# Patient Record
Sex: Male | Born: 1945
Health system: Southern US, Community
[De-identification: ages and names within clinical notes are randomized; demographics above are authoritative.]

## PROBLEM LIST (undated history)

## (undated) DIAGNOSIS — K219 Gastro-esophageal reflux disease without esophagitis: Secondary | ICD-10-CM

## (undated) DIAGNOSIS — I4891 Unspecified atrial fibrillation: Secondary | ICD-10-CM

## (undated) DIAGNOSIS — I2510 Atherosclerotic heart disease of native coronary artery without angina pectoris: Secondary | ICD-10-CM

## (undated) DIAGNOSIS — N529 Male erectile dysfunction, unspecified: Secondary | ICD-10-CM

## (undated) DIAGNOSIS — I10 Essential (primary) hypertension: Secondary | ICD-10-CM

## (undated) DIAGNOSIS — J449 Chronic obstructive pulmonary disease, unspecified: Secondary | ICD-10-CM

## (undated) DIAGNOSIS — C801 Malignant (primary) neoplasm, unspecified: Secondary | ICD-10-CM

## (undated) HISTORY — DX: Male erectile dysfunction, unspecified: N52.9

## (undated) HISTORY — DX: Essential (primary) hypertension: I10

## (undated) HISTORY — PX: CORONARY ARTERY BYPASS GRAFT: SHX141

## (undated) HISTORY — DX: Unspecified atrial fibrillation: I48.91

## (undated) HISTORY — DX: Gastro-esophageal reflux disease without esophagitis: K21.9

## (undated) HISTORY — DX: Atherosclerotic heart disease of native coronary artery without angina pectoris: I25.10

## (undated) HISTORY — DX: Chronic obstructive pulmonary disease, unspecified: J44.9

---

## 2009-02-03 ENCOUNTER — Encounter: Payer: Self-pay | Admitting: Family Medicine

## 2010-01-06 ENCOUNTER — Ambulatory Visit: Payer: Self-pay | Admitting: Family Medicine

## 2010-01-06 DIAGNOSIS — Z9189 Other specified personal risk factors, not elsewhere classified: Secondary | ICD-10-CM | POA: Insufficient documentation

## 2010-01-06 DIAGNOSIS — K219 Gastro-esophageal reflux disease without esophagitis: Secondary | ICD-10-CM

## 2010-01-06 DIAGNOSIS — N529 Male erectile dysfunction, unspecified: Secondary | ICD-10-CM | POA: Insufficient documentation

## 2010-01-06 DIAGNOSIS — I10 Essential (primary) hypertension: Secondary | ICD-10-CM | POA: Insufficient documentation

## 2010-01-07 ENCOUNTER — Ambulatory Visit: Payer: Self-pay | Admitting: Family Medicine

## 2010-01-08 LAB — CONVERTED CEMR LAB
ALT: 20 units/L (ref 0–53)
AST: 26 units/L (ref 0–37)
BUN: 16 mg/dL (ref 6–23)
Bilirubin, Direct: 0.2 mg/dL (ref 0.0–0.3)
CO2: 30 meq/L (ref 19–32)
Chloride: 101 meq/L (ref 96–112)
Cholesterol: 190 mg/dL (ref 0–200)
Creatinine, Ser: 0.9 mg/dL (ref 0.4–1.5)
Potassium: 4.1 meq/L (ref 3.5–5.1)
Total CHOL/HDL Ratio: 4
Total Protein: 7.1 g/dL (ref 6.0–8.3)
Triglycerides: 120 mg/dL (ref 0.0–149.0)

## 2010-01-15 ENCOUNTER — Encounter: Payer: Self-pay | Admitting: Family Medicine

## 2010-01-15 ENCOUNTER — Telehealth: Payer: Self-pay | Admitting: Family Medicine

## 2010-01-19 DIAGNOSIS — F17200 Nicotine dependence, unspecified, uncomplicated: Secondary | ICD-10-CM

## 2010-01-19 DIAGNOSIS — J449 Chronic obstructive pulmonary disease, unspecified: Secondary | ICD-10-CM | POA: Insufficient documentation

## 2010-04-01 ENCOUNTER — Ambulatory Visit: Payer: Self-pay | Admitting: Family Medicine

## 2010-04-01 ENCOUNTER — Encounter: Payer: Self-pay | Admitting: Cardiology

## 2010-04-01 DIAGNOSIS — M79609 Pain in unspecified limb: Secondary | ICD-10-CM

## 2010-04-02 ENCOUNTER — Encounter: Payer: Self-pay | Admitting: Family Medicine

## 2010-04-10 ENCOUNTER — Ambulatory Visit: Payer: Self-pay | Admitting: Cardiology

## 2010-04-10 DIAGNOSIS — R079 Chest pain, unspecified: Secondary | ICD-10-CM

## 2010-04-13 ENCOUNTER — Telehealth: Payer: Self-pay | Admitting: Cardiology

## 2010-04-17 ENCOUNTER — Telehealth (INDEPENDENT_AMBULATORY_CARE_PROVIDER_SITE_OTHER): Payer: Self-pay | Admitting: *Deleted

## 2010-04-17 ENCOUNTER — Ambulatory Visit: Payer: Self-pay | Admitting: Cardiology

## 2010-04-17 LAB — CONVERTED CEMR LAB
BUN: 21 mg/dL (ref 6–23)
Basophils Relative: 0.7 % (ref 0.0–3.0)
Calcium: 9.4 mg/dL (ref 8.4–10.5)
Eosinophils Absolute: 0.1 10*3/uL (ref 0.0–0.7)
GFR calc non Af Amer: 94.45 mL/min (ref >60)
Glucose, Bld: 103 mg/dL — ABNORMAL HIGH (ref 70–99)
Hemoglobin: 14.6 g/dL (ref 13.0–17.0)
MCHC: 34.8 g/dL (ref 30.0–36.0)
MCV: 97.6 fL (ref 78.0–100.0)
Monocytes Absolute: 0.8 10*3/uL (ref 0.1–1.0)
Neutro Abs: 3.7 10*3/uL (ref 1.4–7.7)
Potassium: 4.2 meq/L (ref 3.5–5.1)
WBC: 6.2 10*3/uL (ref 4.5–10.5)

## 2010-04-22 ENCOUNTER — Inpatient Hospital Stay (HOSPITAL_COMMUNITY)
Admission: EM | Admit: 2010-04-22 | Discharge: 2010-04-29 | Payer: Self-pay | Source: Home / Self Care | Admitting: Cardiology

## 2010-04-22 ENCOUNTER — Inpatient Hospital Stay (HOSPITAL_BASED_OUTPATIENT_CLINIC_OR_DEPARTMENT_OTHER): Admission: RE | Admit: 2010-04-22 | Discharge: 2010-04-22 | Payer: Self-pay | Admitting: Cardiology

## 2010-04-22 ENCOUNTER — Ambulatory Visit: Payer: Self-pay | Admitting: Thoracic Surgery (Cardiothoracic Vascular Surgery)

## 2010-04-22 ENCOUNTER — Encounter: Payer: Self-pay | Admitting: Thoracic Surgery (Cardiothoracic Vascular Surgery)

## 2010-04-22 ENCOUNTER — Ambulatory Visit: Payer: Self-pay | Admitting: Cardiology

## 2010-04-23 ENCOUNTER — Encounter: Payer: Self-pay | Admitting: Thoracic Surgery (Cardiothoracic Vascular Surgery)

## 2010-04-24 ENCOUNTER — Encounter: Payer: Self-pay | Admitting: Thoracic Surgery (Cardiothoracic Vascular Surgery)

## 2010-05-04 ENCOUNTER — Encounter: Payer: Self-pay | Admitting: Cardiology

## 2010-05-08 ENCOUNTER — Telehealth: Payer: Self-pay | Admitting: Cardiology

## 2010-05-14 ENCOUNTER — Ambulatory Visit: Payer: Self-pay | Admitting: Cardiology

## 2010-05-14 DIAGNOSIS — I25810 Atherosclerosis of coronary artery bypass graft(s) without angina pectoris: Secondary | ICD-10-CM | POA: Insufficient documentation

## 2010-05-14 DIAGNOSIS — I4891 Unspecified atrial fibrillation: Secondary | ICD-10-CM

## 2010-05-15 ENCOUNTER — Ambulatory Visit: Payer: Self-pay | Admitting: Cardiology

## 2010-05-18 ENCOUNTER — Ambulatory Visit: Payer: Self-pay | Admitting: Thoracic Surgery (Cardiothoracic Vascular Surgery)

## 2010-05-18 ENCOUNTER — Encounter
Admission: RE | Admit: 2010-05-18 | Discharge: 2010-05-18 | Payer: Self-pay | Admitting: Thoracic Surgery (Cardiothoracic Vascular Surgery)

## 2010-05-19 ENCOUNTER — Encounter (HOSPITAL_COMMUNITY)
Admission: RE | Admit: 2010-05-19 | Discharge: 2010-07-28 | Payer: Self-pay | Source: Home / Self Care | Attending: Cardiology | Admitting: Cardiology

## 2010-05-19 LAB — CONVERTED CEMR LAB: TSH: 6.34 microintl units/mL — ABNORMAL HIGH (ref 0.35–5.50)

## 2010-05-25 ENCOUNTER — Encounter: Payer: Self-pay | Admitting: Cardiology

## 2010-05-29 ENCOUNTER — Encounter: Payer: Self-pay | Admitting: Cardiology

## 2010-06-23 ENCOUNTER — Ambulatory Visit: Payer: Self-pay

## 2010-06-25 ENCOUNTER — Encounter: Payer: Self-pay | Admitting: Cardiology

## 2010-06-25 ENCOUNTER — Ambulatory Visit: Payer: Self-pay | Admitting: Cardiology

## 2010-06-30 ENCOUNTER — Ambulatory Visit
Admission: RE | Admit: 2010-06-30 | Discharge: 2010-06-30 | Payer: Self-pay | Source: Home / Self Care | Attending: Cardiology | Admitting: Cardiology

## 2010-07-07 ENCOUNTER — Encounter: Payer: Self-pay | Admitting: Cardiology

## 2010-07-08 LAB — CONVERTED CEMR LAB
ALT: 23 U/L
AST: 27 U/L
Albumin: 3.6 g/dL
Alkaline Phosphatase: 73 U/L
Bilirubin, Direct: 0.1 mg/dL
Cholesterol: 135 mg/dL
Free T4: 0.96 ng/dL
HDL: 41.6 mg/dL
LDL Cholesterol: 74 mg/dL
T3, Free: 2.3 pg/mL
TSH: 5.45 u[IU]/mL
Total Bilirubin: 0.8 mg/dL
Total CHOL/HDL Ratio: 3
Total Protein: 6.8 g/dL
Triglycerides: 98 mg/dL
VLDL: 19.6 mg/dL

## 2010-07-26 LAB — CONVERTED CEMR LAB
ALT: 25 units/L (ref 0–53)
Alkaline Phosphatase: 98 units/L (ref 39–117)
BUN: 19 mg/dL (ref 6–23)
Bilirubin, Direct: 0.1 mg/dL (ref 0.0–0.3)
Creatinine, Ser: 1.2 mg/dL (ref 0.4–1.5)
GFR calc non Af Amer: 66.2 mL/min (ref >60)
Total Protein: 6.2 g/dL (ref 6.0–8.3)

## 2010-07-28 NOTE — Letter (Signed)
Summary: Records from [facility] 2009 - 2010  Records from [facility] 2009 - 2010   Imported By: Maryln Gottron 01/23/2010 12:39:26  _____________________________________________________________________  External Attachment:    Type:   Image     Comment:   External Document

## 2010-07-28 NOTE — Progress Notes (Signed)
Summary: Pt want prescription changed  Phone Note From Pharmacy   Caller: Patient Caller: [facility] [phone]* Summary of Call: Pharmacy called because the pt requested a 90 day supply that  would be 180 pills pt would like his prescription changed to 180 pills( Zyban ) Initial call taken by: Judie Grieve,  April 13, 2010 3:13 PM    Prescriptions: ZYBAN 150 MG XR12H-TAB (BUPROPION HCL (SMOKING DETER)) Take 1 tablet daily for 3 day.  Then take 1 tablet twice a day.  #180 x 0   Entered by:   Lisabeth Devoid RN   Authorized by:   Marca Ancona, MD   Signed by:   Lisabeth Devoid RN on 04/13/2010   Method used:   Electronically to        [facility] [phone]* (retail)       [address]       [place], [place]  [address]       Ph: [phone]       Fax: [phone]   RxID:   [id number]

## 2010-07-28 NOTE — Assessment & Plan Note (Signed)
Summary: LEFT ARM PAIN/CLE   Vital Signs:  Patient profile:   65 year old male Height:      70 inches Weight:      183 pounds BMI:     26.35 Temp:     97.7 degrees F oral Pulse rate:   60 / minute Pulse rhythm:   regular BP sitting:   122 / 80  (right arm) Cuff size:   regular  Vitals Entered By: Linde Gillis CMA [name] Dull) (April 01, 2010 3:11 PM) CC: left arm pain   History of Present Illness: Has been walking about at at time, several times a week.  Occ episodes of L arm pain during exercise.  Most of the time the patient can walk w/o symptoms.  H/o L olecranon bursitis, but this is different per patient.  When walking and pain happens, rest will help- resolves in 1-2 minutes.  No chest pain.  Occ SOB on hills when walking.  Smoking about 3 packs a week, cutting down some.  HTN, controlled.  Smoked for about 50 years.    L handed.  No pain with lifting objects.    Allergies (verified): No Known Drug Allergies  Past History:  Past Medical History: Last updated: 01/19/2010 COPD (ICD-496) ERECTILE DYSFUNCTION, ORGANIC (ICD-607.84) HYPERTENSION (ICD-401.9) GERD (ICD-530.81) CHICKENPOX, HX OF (ICD-V15.9)    Family History: Reviewed history from 01/19/2010 and no changes required. Family History of Arthritis, parents Family History Hypertension, parents Father: Died of lung cancer Mother: Died of unknown causes in a rest home Siblings: None  Social History: Reviewed history from 01/19/2010 and no changes required. 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    Review of Systems       See HPI.  Otherwise negative.    Physical Exam  General:  GEN: nad, alert and oriented HEENT: mucous membranes moist NECK: supple w/o LA, no bruit CV: rrr.  no murmur PULM: ctab, no inc  wob ABD: soft, +bs EXT: no edema, 2+ DP and radial pulses SKIN: no acute rash, benign appearing SKs noted on torso L shoulder with minimal impingement on exam, o/w wnl for motor function.  L arm and shoulder not tender to palpation.    Impression & Recommendations:  Problem # 1:  ARM PAIN, LEFT (ICD-729.5) >25 min spent with patient, at least half of which was spent on counseling re: symptoms and plan. I don't think this is due to a cuff finding.  I would have patient follow up with cards given the exertional component, even though it is atypical (ie no CP).  He is a smoker with h/o htn.  He agrees with plan.  I would appreciate cards input on utility for stress testing.  If CP, to ER.  he agrees.  Orders: EKG w/ Interpretation (93000) Cardiology Referral (Cardiology)  Complete Medication List: 1)  Lisinopril 10 Mg Tabs (Lisinopril) .... Take 1/2 tablet by mouth once a day 2)  Hydrochlorothiazide 25 Mg Tabs (Hydrochlorothiazide) .... Take 1/2 tablet by mouth once a day 3)  Ranitidine Hcl 150 Mg Caps (Ranitidine hcl) .... Take 1 tablet by mouth once a day 4)  Multivitamins Tabs (Multiple vitamin) .... Take 1 tablet by mouth once a day 5)  Folic Acid Powd (Folic acid) .... Take 1 tablet by mouth once a day 6)  Aspirin 81  Mg Tabs (Aspirin) .... Take 1 tablet by mouth once a day 7)  Magnesium 300 Mg Caps (Magnesium) .... Take 1 tablet by mouth once a day 8)  Viagra 50 Mg Tabs (Sildenafil citrate) .[name].. 1 by mouth daily as needed.  Patient Instructions: 1)  Keep taking your regular medicine and see Shirlee Limerick about your referral before your leave today.  If you have chest pain, go to the ER.   Current Allergies (reviewed today): No known allergies

## 2010-07-28 NOTE — Medication Information (Signed)
Summary: Prior Authorization & Approval for Viagra/[facility]  Prior Authorization & Approval for Viagra/[facility]   Imported By: Lanelle Bal 01/21/2010 11:20:22  _____________________________________________________________________  External Attachment:    Type:   Image     Comment:   External Document

## 2010-07-28 NOTE — Cardiovascular Report (Signed)
Summary: Pre Cath Orders   Pre Cath Orders   Imported By: Roderic Ovens 04/20/2010 16:04:27  _____________________________________________________________________  External Attachment:    Type:   Image     Comment:   External Document

## 2010-07-28 NOTE — Miscellaneous (Signed)
Summary: [facility] Progress Note   [facility] Progress Note   Imported By: Roderic Ovens 06/02/2010 12:36:24  _____________________________________________________________________  External Attachment:    Type:   Image     Comment:   External Document

## 2010-07-28 NOTE — Progress Notes (Signed)
Summary: Question about medications  Phone Note Call from Patient Call back at Home Phone [phone]   Caller: Patient Summary of Call: [facility] about medication Initial call taken by: Judie Grieve,  April 13, 2010 8:36 AM  Follow-up for Phone Call        [name] calls today b/c pharmacy unable to fill zyban.  His insurance would cover a 90 day supply.  I sent that in this am.  He also had questions about b/p meds.  With Metoprolol his systolic is running 107-110.  He is having no adverse symptoms and will stagger his lisinopril and hctz. Mylo Red RN    Prescriptions: ZYBAN 150 MG XR12H-TAB (BUPROPION HCL (SMOKING DETER)) Take 1 tablet daily for 3 day.  Then take 1 tablet twice a day.  #90 x 0   Entered by:   Lisabeth Devoid RN   Authorized by:   Marca Ancona, MD   Signed by:   Lisabeth Devoid RN on 04/13/2010   Method used:   Electronically to        [facility] [phone]* (retail)       [address]       [place], [place]  [address]       Ph: [phone]       Fax: [phone]   RxID:   [id number]   Appended Document: Question about medications That BP is ok for him.  Want systolic > 100 as long as asymptomatic

## 2010-07-28 NOTE — Assessment & Plan Note (Signed)
Summary: TRANSFER FROM [facility]/RX REFILL/CLE   Vital Signs:  Patient profile:   65 year old male Height:      70 inches Weight:      179.50 pounds BMI:     25.85 Temp:     97.6 degrees F oral Pulse rate:   80 / minute Pulse rhythm:   regular BP sitting:   140 / 60  (left arm) Cuff size:   regular  Vitals Entered By: Delilah Shan CMA Xyla Leisner Dull) (January 06, 2010 8:32 AM) CC: Transfer from [place] - Rx. refills   History of Present Illness: Hypertension: taking 1/2 dose of each med.  Using medication without problems or lightheadedness: yes Chest pain with exertion:no Edema:no Short of breath:no Average home BPs:120s/70-80s Other issues:   ED- prev on viagra per Dr. Abigail Miyamoto.  Was taking 50mg  tabs.  Had some stomach upset if taken around mealtime.  Has good effect from medicine.  Needs new rx.  [facility] [place] [place]  Preventive Screening-Counseling & Management  Alcohol-Tobacco     Smoking Status: current  Caffeine-Diet-Exercise     Does Patient Exercise: yes      Drug Use:  no.    Problems Prior to Update: 1)  Erectile Dysfunction, Organic  (ICD-607.84) 2)  Hypertension  (ICD-401.9) 3)  Gerd  (ICD-530.81) 4)  Chickenpox, Hx of  (ICD-V15.9)  Current Medications (verified): 1)  Lisinopril 10 Mg Tabs (Lisinopril) .... Take 1/2 Tablet By Mouth Once A Day 2)  Hydrochlorothiazide 25 Mg Tabs (Hydrochlorothiazide) .... Take 1/2 Tablet By Mouth Once A Day 3)  Ranitidine Hcl 150 Mg Caps (Ranitidine Hcl) .... Take 1 Tablet By Mouth Once A Day 4)  Multivitamins   Tabs (Multiple Vitamin) .... Take 1 Tablet By Mouth Once A Day 5)  Folic Acid   Powd (Folic Acid) .... Take 1 Tablet By Mouth Once A Day 6)  Aspirin 81 Mg  Tabs (Aspirin) .... Take 1 Tablet By Mouth Once A Day 7)  Magnesium 300 Mg Caps (Magnesium) .... Take 1 Tablet By Mouth Once A Day 8)  Viagra 50 Mg Tabs (Sildenafil Citrate) .[name].. 1 By Mouth Daily As Needed.  Allergies (verified): No Known Drug Allergies  Past  History:  Past Medical History: HYPERTENSION (ICD-401.9) GERD (ICD-530.81) CHICKENPOX, HX OF (ICD-V15.9)    Family History: Family History of Arthritis, parents Family History Hypertension, parents  Social History: Current Smoker 3 packs week Alcohol use-no Drug use-no Regular exercise-yes Caffeinated beverages - yes Dentures - Yes Smoking Status:  current Drug Use:  no Does Patient Exercise:  yes  Physical Exam  General:  GEN: nad, alert and oriented HEENT: mucous membranes moist, dentures noted NECK: supple w/o LA CV: rrr.  no murmur PULM: ctab, no inc wob ABD: soft, +bs EXT: no edema SKIN: no acute rash, benign appearing SKs noted on torso   Impression & Recommendations:  Problem # 1:  HYPERTENSION (ICD-401.9) Hard copy written for patient to have fasting CMET/lipid drawn at a [facility] site in [place].  this will be closer for patient and he is not fasting today.  He understood plan.  Continue current meds.  Will contact with labs. Plan for physical later this year.  Requesting records.  His updated medication list for this problem includes:    Lisinopril 10 Mg Tabs (Lisinopril) .[name]... Take 1/2 tablet by mouth once a day    Hydrochlorothiazide 25 Mg Tabs (Hydrochlorothiazide) .[name]... Take 1/2 tablet by mouth once a day  Problem # 2:  ERECTILE DYSFUNCTION,  ORGANIC (ICD-607.84) Rx sent.  His updated medication list for this problem includes:    Viagra 50 Mg Tabs (Sildenafil citrate) .[name]... 1 by mouth daily as needed.  Complete Medication List: 1)  Lisinopril 10 Mg Tabs (Lisinopril) .... Take 1/2 tablet by mouth once a day 2)  Hydrochlorothiazide 25 Mg Tabs (Hydrochlorothiazide) .... Take 1/2 tablet by mouth once a day 3)  Ranitidine Hcl 150 Mg Caps (Ranitidine hcl) .... Take 1 tablet by mouth once a day 4)  Multivitamins Tabs (Multiple vitamin) .... Take 1 tablet by mouth once a day 5)  Folic Acid Powd (Folic acid) .... Take 1 tablet by mouth once a day 6)  Aspirin 81  Mg Tabs (Aspirin) .... Take 1 tablet by mouth once a day 7)  Magnesium 300 Mg Caps (Magnesium) .... Take 1 tablet by mouth once a day 8)  Viagra 50 Mg Tabs (Sildenafil citrate) .[name].. 1 by mouth daily as needed.  Patient Instructions: 1)  Please schedule a follow-up appointment in 6 months  for a physical.  Come in fasting.  I'll get your records from [facility] in the meantime.  You can have your labs drawn in the next few weeks at any [facility] site.  I'll contact you with the results.  Prescriptions: VIAGRA 50 MG TABS (SILDENAFIL CITRATE) 1 by mouth daily as needed.  #60 x 3   Entered and Authorized by:   Crawford Givens MD   Signed by:   Crawford Givens MD on 01/06/2010   Method used:   Electronically to        [facility] [phone]* (retail)       [address]       [place], [place]  [address]       Ph: [phone]       Fax: [phone]   RxID:   [id number]   Current Allergies (reviewed today): No known allergies

## 2010-07-28 NOTE — Letter (Signed)
Summary: Out of Work  [facility], Main Office  [address]   [place], [place] [address]   Phone: [phone]  Fax: [phone]    April 10, 2010   Employee:  [employer]    To Whom It May Concern:   Please excuse the above named employee from work due to: medical reasons until he is cleared to return for work.  Start:   October 14,2011   If you need additional information, please feel free to contact our office.         Sincerely,   Dr. Pervis Hocking RN

## 2010-07-28 NOTE — Progress Notes (Signed)
Summary: prior Berkley Harvey is needed for viagra  Phone Note From Pharmacy   Caller: [id number] Summary of Call: Prior Berkley Harvey is needed for viagra, form is on your desk. Initial call taken by: Lowella Petties CMA,  January 15, 2010 8:19 AM  Follow-up for Phone Call        signed, thanks.   Follow-up by: Crawford Givens MD,  January 15, 2010 10:59 AM  Additional Follow-up for Phone Call Additional follow up Details #1::        Faxed and form given to [facility]. Lugene Fuquay CMA Sharea Guinther Dull)  January 15, 2010 4:08 PM      Appended Document: prior Berkley Harvey is needed for viagra Prior auth approved for viagra.  Form sent for dr's signature and scanning.

## 2010-07-28 NOTE — Progress Notes (Signed)
Summary: pt request call  Phone Note Call from Patient Call back at [phone]   Caller: Patient Reason for Call: Talk to Nurse Initial call taken by: Judie Grieve,  May 08, 2010 9:05 AM  Follow-up for Phone Call        I talked with pt--he complains of a tickle in his throat that causes him to have a dry cough-this started about the day after he was discharged from the hospital--he denies SOB,edema or weight gain, --overall he is doing fine-I confirmed pt is not taking Lisinopril-I will review with Dr Gae Gallop, RN, BSN  May 08, 2010 9:30 AM      Appended Document: pt request call reviewed  with Dr Darvin Neighbours recommended no changes at present --I talked with pt--pt will monitor symptoms and will see Dr Shirlee Latch 05/14/10

## 2010-07-28 NOTE — Miscellaneous (Signed)
Summary: [facility] Physician Order/Treatment Plan   [facility] Physician Order/Treatment Plan   Imported By: Roderic Ovens 05/18/2010 15:10:01  _____________________________________________________________________  External Attachment:    Type:   Image     Comment:   External Document

## 2010-07-28 NOTE — Assessment & Plan Note (Signed)
Summary: NP6/CHEST PAIN & ARM APIN/JML   Visit Type:  new pt visit Referring Provider:  Raechel Ache Primary Provider:  Raechel Ache  CC:  sob w/exertion...left arm pain going on since on and off....chest pressure/numbness....denies any edema....  History of Present Illness: 65 yo with history of HTN, COPD and smoking presents for evaluation of left arm pain and chest heaviness with exertion.  For the last 4-5 months, patient has had episodes of left arm throbbing associated with shortness of breath and chest pressure/heaviness.  These episodes tend to occur with fairly heavy exertion, but have been becoming more frequent recently.  He initially had these episodes while in [place] on vacation.  He would develop the symptoms walking up very steep hills, and the chest pressure/arm throbbing would resolve with rest.  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  He walks with his wife several times a week at [employer] [place], and has begun to note the symptoms when he goes up hills briskly there.  The pain is now occurring also daily at work when he loads heavy bags.  He never has the symptoms at rest or with only mild exertion.  Pain always resolves promptly with rest.  He continues to smoke about 3 packs/week.    ECG: NSR, normal   Labs (7/11): LDL 122, HDL 44, K 4.1, creatinine 0.9  Current Medications (verified): 1)  Lisinopril 10 Mg Tabs (Lisinopril) .... Take 1/2 Tablet By Mouth Once A Day 2)  Hydrochlorothiazide 25 Mg Tabs (Hydrochlorothiazide) .... Take 1/2 Tablet By Mouth Once A Day 3)  Ranitidine Hcl 150 Mg Caps (Ranitidine Hcl) .[name].. 1 Tab Two Times A Day 4)  Multivitamins   Tabs (Multiple Vitamin) .... Take 1 Tablet By Mouth Once A Day 5)  Folic Acid 1 Mg Tabs (Folic Acid) .[name].. 1 Tab 2 X Weekly 6)  Aspirin 81 Mg  Tabs (Aspirin) .... Take 1 Tablet By Mouth Once A Day 7)  Magnesium 300 Mg Caps (Magnesium) .... Take  1 Tablet By Mouth Once A Day 8)  Viagra 50 Mg Tabs (Sildenafil Citrate) .[name].. 1 By Mouth Daily As Needed.  Allergies (verified): No Known Drug Allergies  Past History:  Past Medical History: 1. COPD (ICD-496): active smoker 2. ERECTILE DYSFUNCTION, ORGANIC (ICD-607.84) 3. HYPERTENSION (ICD-401.9) 4. GERD (ICD-530.81)  Family History: Family History of Arthritis, parents Family History Hypertension, parents Father: Died of lung cancer Mother: Died of unknown causes in a rest home - sounds like possible sudden cardiac death at age 40. 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    Review of Systems       All systems reviewed and negative except as per HPI.   Vital Signs:  Patient profile:   65 year old male Height:      70 inches Weight:      179.4 pounds BMI:     25.83 Pulse rate:   59 / minute Pulse rhythm:   regular BP sitting:   132 / 74  (left arm) Cuff size:   large  Vitals Entered By: Danielle Rankin, CMA (April 10, 2010 10:24 AM)  Physical Exam  General:  Well developed, well nourished, in  no acute distress. Head:  normocephalic and atraumatic Nose:  no deformity, discharge, inflammation, or lesions Mouth:  Teeth, gums and palate normal. Oral mucosa normal. Neck:  Neck supple, no JVD. No masses, thyromegaly or abnormal cervical nodes. Lungs:  Clear bilaterally to auscultation and percussion. Heart:  Non-displaced PMI, chest non-tender; regular rate and rhythm, S1, S2 without murmurs, rubs.  +S4. Carotid upstroke normal, no bruit.  Pedals normal pulses. No edema, no varicosities. Abdomen:  Bowel sounds positive; abdomen soft and non-tender without masses, organomegaly, or hernias noted. No  hepatosplenomegaly. Msk:  Back normal, normal gait. Muscle strength and tone normal. Extremities:  No clubbing or cyanosis. Neurologic:  Alert and oriented x 3. Skin:  Intact without lesions or rashes. Psych:  Normal affect.   Impression & Recommendations:  Problem # 1:  CHEST PAIN (ICD-786.50) Patient has episodes of exertional chest heaviness and left arm throbbing.  Symptoms resolve with rest.  Moderate to heavy exertion brings on the symptoms.  They have been occurring more lately, but never at rest or with minimal exertion.  Story is worrisome for angina.   - Plan left heart cath, will use radial approach. - Out of work until cath given frequent symptoms at work.  - Will give NTG sublingual. - Start metoprolol 25 mg two times a day - Continue ASA - To ER if rest pain or pain does not resolve quickly with rest or NTG.   Problem # 2:  TOBACCO ABUSE (ICD-305.1) Strongly encouraged him to quit.   Other Orders: EKG w/ Interpretation (93000) Cardiac Catheterization (Cardiac Cath)  Patient Instructions: 1)  Your physician recommends that you return for lab work ON:GEXBMWU 21,2011  bmet,cbc, pt,ptt 401.9 2)  Your physician has recommended you make the following change in your medication: DO NOT TAKE VIAGRA SAME TIME AS NTG 3)  Your physician has requested that you have a cardiac catheterization.  Cardiac catheterization is used to diagnose and/or treat various heart conditions. Doctors may recommend this procedure for a number of different reasons. The most common reason is to evaluate chest pain. Chest pain can be a symptom of coronary artery disease (CAD), and cardiac catheterization can show whether plaque is narrowing or blocking your heart's arteries. This procedure is also used to evaluate the valves, as well as measure the blood flow and oxygen levels in different parts of your heart.  For further information please visit [email or web address]  Please follow instruction sheet, as  given. Prescriptions: NITROSTAT 0.4 MG SUBL (NITROGLYCERIN) 1 tablet under tongue at onset of chest pain; you may repeat every 5 minutes for up to 3 doses.  #25 x 11   Entered by:   Lisabeth Devoid RN   Authorized by:   Marca Ancona, MD   Signed by:   Lisabeth Devoid RN on 04/10/2010   Method used:   Electronically to        [facility] [phone]* (retail)       [address]       [place], [place]  [address]       Ph: [phone]       Fax: [phone]   RxID:   [id number] ZYBAN 150 MG XR12H-TAB (BUPROPION HCL (SMOKING DETER)) Take 1 tablet daily for 3 day.  Then take 1 tablet twice a day.  #57 x 3   Entered by:   Lisabeth Devoid RN   Authorized by:   Marca Ancona, MD   Signed by:  Lisabeth Devoid RN on 04/10/2010   Method used:   Electronically to        [facility]S  [facility] [phone]* (retail)       [address]       [place], [place]  [address]       Ph: [phone]       Fax: [phone]   RxID:   [id number] METOPROLOL TARTRATE 25 MG TABS (METOPROLOL TARTRATE) Take one tablet by mouth twice a day  #60 x 6   Entered by:   Lisabeth Devoid RN   Authorized by:   Marca Ancona, MD   Signed by:   Lisabeth Devoid RN on 04/10/2010   Method used:   Electronically to        CVS  [facility] [phone]* (retail)       [address]       [place], [place]  [address]       Ph: [phone]       Fax: [phone]   RxID:   [id number]

## 2010-07-28 NOTE — Letter (Signed)
Summary: Cardiac Catheterization Instructions- [facility]  [facility], Main Office  [address]   [place], [place] [address]   Phone: [phone]  Fax: [phone]     04/10/2010 MRN: [id number]  [name] [name] [address] [place], [place]  [address]  Dear [name], [name] are scheduled for a Cardiac Catheterization on October 26,2011 with Dr. Shirlee Latch.  Please arrive to the 1st floor of the Heart and Vascular Center at [facility] at 8:30 am  on the day of your procedure. Please do not arrive before 6:30 a.m. Call the Heart and Vascular Center at [phone] if you are unable to make your appointmnet. The Code to get into the parking garage under the building is 0200. Take the elevators to the 1st floor. You must have someone to drive you home. Someone must be with you for the first 24 hours after you arrive home. Please wear clothes that are easy to get on and off and wear slip-on shoes. Do not eat or drink after midnight except water with your medications that morning. Bring all your medications and current insurance cards with you.   __x_ Make sure you take your aspirin.  __x_ You may take ALL of your medications with water that morning.   The usual length of stay after your procedure is 2 to 3 hours. This can vary.  If you have any questions, please call the office at the number listed above.  Dr. Pervis Hocking RN

## 2010-07-28 NOTE — Progress Notes (Signed)
  Pt left [employer] papers to be completed sent to [facility]  April 17, 2010 10:00 AM

## 2010-07-28 NOTE — Assessment & Plan Note (Signed)
Summary: [name] [name]   Visit Type:  Follow-up Referring Provider:  Raechel Ache Primary Provider:  Raechel Ache   History of Present Illness: 65 yo with history of HTN, COPD and smoking initially presented with exertional chest pain.  Left heart cath was done, showing severe 3 vessel disease.  Patient therefore underwent CABG in 10/11.  Postoperative course was complicated by transient atrial fibrillation.  He is in sinus rhythm today.  He has been doing well in general since getting home.  He has been walking for 20 minutes twice a day without exertional dyspnea or chest pain.  He does get some fatigue.  He plans to start cardiac rehab on Tuesday.  He has quit smoking.   ECG: NSR, normal   Labs (7/11): LDL 122, HDL 44, K 4.1, creatinine 0.9  Current Medications (verified): 1)  Ranitidine Hcl 150 Mg Caps (Ranitidine Hcl) .[name].. 1 Tab Two Times A Day 2)  Multivitamins   Tabs (Multiple Vitamin) .... Take 1 Tablet By Mouth Once A Day 3)  Folic Acid 1 Mg Tabs (Folic Acid) .[name].. 1 Tab 2 X Weekly 4)  Aspirin 81 Mg  Tabs (Aspirin) .... Take 1 Tablet By Mouth Once A Day 5)  Viagra 50 Mg Tabs (Sildenafil Citrate) .[name].. 1 By Mouth Daily As Needed. 6)  Metoprolol Tartrate 25 Mg Tabs (Metoprolol Tartrate) .... Take One Tablet By Mouth Twice A Day 7)  Zyban 150 Mg Xr12h-Tab (Bupropion Hcl (Smoking Deter)) .... Take 1 Tablet Twice A Day. 8)  Nitrostat 0.4 Mg Subl (Nitroglycerin) .[name].. 1 Tablet Under Tongue At Onset of Chest Pain; You May Repeat Every 5 Minutes For Up To 3 Doses. 9)  Amiodarone Hcl 200 Mg Tabs (Amiodarone Hcl) .... Take One Tablet By Mouth Twice A Day 10)  Fibertab 625 Mg Tabs (Calcium Polycarbophil) .... Uad 11)  Crestor 20 Mg Tabs (Rosuvastatin Calcium) .... Take One Tablet By Mouth Daily. 12)  Diphenhydramine Hcl 25 Mg Tabs (Diphenhydramine Hcl) .... Uad  Allergies (verified): No Known Drug Allergies  Past History:  Past Medical History: 1. COPD (ICD-496): quit smoking 10/11.  2.  ERECTILE DYSFUNCTION, ORGANIC (ICD-607.84) 3. HYPERTENSION (ICD-401.9) 4. GERD (ICD-530.81) 5. CAD: Exertional chest pain prompted LHC (10/11) showing EF 55%, mild inferior hypokinesis, 90% prox RCA, 70% mid RCA, 80% distal RCA, 70% ostial PDA, 70% mPLV, 90% mid OM1 (large), 90-95% prox LAD.  Patient had CABG with LIMA-LAD, SVG-OM1, seq SVG-PDA/PLV.  6. Atrial fibrillation: Brief, post-op CABG.   Family History: Reviewed history from 04/10/2010 and no changes required. Family History of Arthritis, parents Family History Hypertension, parents Father: Died of lung cancer Mother: Died of unknown causes in a rest home - sounds like possible sudden cardiac death at age 54. Siblings: None  Social History: Quit smoking 10/11.  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    Review of Systems       All systems reviewed and negative except as per HPI.   Vital Signs:  Patient profile:   65 year old male Height:      70 inches Weight:      174 pounds BMI:     25.06 Pulse rate:   58 / minute BP sitting:   104 / 60  (left arm)  Vitals Entered By: Laurance Flatten CMA (May 14, 2010 9:02 AM)  Physical Exam  General:  Well developed, well nourished, in no acute distress. Neck:  Neck supple, no JVD. No masses, thyromegaly or abnormal cervical nodes. Lungs:  Clear bilaterally to auscultation and percussion. Heart:  Non-displaced PMI, chest non-tender; regular rate and rhythm, S1, S2 without murmurs, rubs.  +S4. Carotid upstroke normal, no bruit.  Pedals normal pulses. No edema, no varicosities. Abdomen:  Bowel sounds positive; abdomen soft and non-tender without masses, organomegaly, or hernias noted. No hepatosplenomegaly. Extremities:  No clubbing or cyanosis. Neurologic:  Alert and  oriented x 3. Skin:  Healing midline sternotomy.  Psych:  Normal affect.   Impression & Recommendations:  Problem # 1:  CORONARY ATHEROSLERO UNSPEC TYPE BYPASS GRAFT (ICD-414.05) Patient is stable post-CABG.  EF preserved on pre-op LV-gram.  No ischemic symptoms since discharge.  Continue ASA, metoprolol, Crestor. Will restart lisinopril after amiodarone is discontinued (BP is soft currently).  To start cardiac rehab.  Will need lipids/LFTs in 2 months.   Problem # 2:  COPD (ICD-496) Patient has quit smoking.   Problem # 3:  ATRIAL FIBRILLATION (ICD-427.31) Patient had post-operative atrial fibrillation.  Would favor continuing amiodarone for 1 month post-discharge then stopping.  Check TSH and LFTs today.    Followup in 6 wks.   Other Orders: TLB-TSH (Thyroid Stimulating Hormone) (84443-TSH) TLB-Hepatic/Liver Function Pnl (80076-HEPATIC) TLB-BMP (Basic Metabolic Panel-BMET) (80048-METABOL)  Patient Instructions: 1)  Your physician recommends that you have lab today---TSH/BMP/Liver profile  414.05  2)  Your physician recommends that you schedule a follow-up appointment in: 6 weeks with Dr Shirlee Latch. Prescriptions: CRESTOR 20 MG TABS (ROSUVASTATIN CALCIUM) Take one tablet by mouth daily.  #90 x 1   Entered by:   Katina Dung, RN, BSN   Authorized by:   Marca Ancona, MD   Signed by:   Katina Dung, RN, BSN on 05/14/2010   Method used:   Electronically to        [facility] [phone]* (retail)       [address]       [place], [place]  [address]       Ph: [phone]       Fax: [phone]   RxID:   [id number] METOPROLOL TARTRATE 25 MG TABS (METOPROLOL TARTRATE) Take one tablet by mouth twice a day  #180 x 3   Entered by:   Katina Dung, RN, BSN   Authorized by:   Marca Ancona, MD   Signed by:   Katina Dung, RN, BSN on 05/14/2010   Method used:   Electronically to        [facility] [phone]* (retail)       [address]        [place], [place]  [address]       Ph: [phone]       Fax: [phone]   RxID:   [id number]

## 2010-07-30 NOTE — Letter (Signed)
Summary: [facility]  [facility]   Imported By: Marylou Mccoy 06/15/2010 14:59:01  _____________________________________________________________________  External Attachment:    Type:   Image     Comment:   External Document

## 2010-07-30 NOTE — Miscellaneous (Signed)
Summary: [facility]iac Progress Note    Cardiac Progress Note   Imported By: Roderic Ovens 07/20/2010 15:49:04  _____________________________________________________________________  External Attachment:    Type:   Image     Comment:   External Document

## 2010-07-30 NOTE — Assessment & Plan Note (Signed)
Summary: per check out   Visit Type:  Follow-up Referring Provider:  Raechel Ache Primary Provider:  Raechel Ache  CC:  no complaints.  History of Present Illness: 65 yo with history of HTN, COPD and smoking initially presented with exertional chest pain.  Left heart cath was done, showing severe 3 vessel disease.  Patient therefore underwent CABG in 10/11.  Postoperative course was complicated by transient atrial fibrillation.  He continues in sinus rhythm today.  He has been doing well in general since getting home.  He has been walking for 20 minutes twice a day without exertional dyspnea or chest pain.  He is off amiodarone now.  He is in cardiac rehab.  His mood seems to have improved.  He continues to remain abstinent from smoking.  Systolic BP has been running in the 100s at home.  No lightheadedness or syncope.    ECG: NSR, LAE  Labs (7/11): LDL 122, HDL 44, K 4.1, creatinine 0.9 Labs (11/11): TSH 6.34 (increased), free T3 low, free T4 normal, LFTs normal, K 4.4, creatinine 1.2  Current Medications (verified): 1)  Ranitidine Hcl 150 Mg Caps (Ranitidine Hcl) .[name].. 1 Tab Two Times A Day 2)  Multivitamins   Tabs (Multiple Vitamin) .... Take 1 Tablet By Mouth Once A Day 3)  Folic Acid 1 Mg Tabs (Folic Acid) .[name].. 1 Tab 2 X Weekly 4)  Aspirin Ec 325 Mg Tbec (Aspirin) .... Take One Tablet By Mouth Daily 5)  Viagra 50 Mg Tabs (Sildenafil Citrate) .[name].. 1 By Mouth Daily As Needed. 6)  Metoprolol Tartrate 25 Mg Tabs (Metoprolol Tartrate) .... Take One Tablet By Mouth Twice A Day 7)  Nitrostat 0.4 Mg Subl (Nitroglycerin) .[name].. 1 Tablet Under Tongue At Onset of Chest Pain; You May Repeat Every 5 Minutes For Up To 3 Doses. 8)  Fibertab 625 Mg Tabs (Calcium Polycarbophil) .... Uad 9)  Crestor 20 Mg Tabs (Rosuvastatin Calcium) .... Take One Tablet By Mouth Daily. 10)  Diphenhydramine Hcl 25 Mg Tabs (Diphenhydramine Hcl) .... Uad  Allergies (verified): No Known Drug Allergies  Past  History:  Past Medical History: Reviewed history from 05/14/2010 and no changes required. 1. COPD (ICD-496): quit smoking 10/11.  2. ERECTILE DYSFUNCTION, ORGANIC (ICD-607.84) 3. HYPERTENSION (ICD-401.9) 4. GERD (ICD-530.81) 5. CAD: Exertional chest pain prompted LHC (10/11) showing EF 55%, mild inferior hypokinesis, 90% prox RCA, 70% mid RCA, 80% distal RCA, 70% ostial PDA, 70% mPLV, 90% mid OM1 (large), 90-95% prox LAD.  Patient had CABG with LIMA-LAD, SVG-OM1, seq SVG-PDA/PLV.  6. Atrial fibrillation: Brief, post-op CABG.   Family History: Reviewed history from 04/10/2010 and no changes required. Family History of Arthritis, parents Family History Hypertension, parents Father: Died of lung cancer Mother: Died of unknown causes in a rest home - sounds like possible sudden cardiac death at age 60. Siblings: None  Social History: Reviewed history from 05/14/2010 and no changes required. Quit smoking 10/11.  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    Vital Signs:  Patient profile:   65 year old male Height:      70 inches Weight:      182.50 pounds BMI:     26.28 Pulse rate:   62 / minute BP sitting:   106 / 62  (left arm) Cuff size:   regular  Vitals  Entered By: Caralee Ates CMA (June 25, 2010 8:42 AM)  Physical Exam  General:  Well developed, well nourished, in no acute distress. Neck:  Neck supple, no JVD. No masses, thyromegaly or abnormal cervical nodes. Lungs:  Clear bilaterally to auscultation and percussion. Heart:  Non-displaced PMI, chest non-tender; regular rate and rhythm, S1, S2 without murmurs, rubs.  +S4. Carotid upstroke normal, no bruit.  Pedals normal pulses. No edema, no varicosities. Abdomen:  Bowel sounds positive; abdomen soft and non-tender  without masses, organomegaly, or hernias noted. No hepatosplenomegaly. Extremities:  No clubbing or cyanosis. Neurologic:  Alert and oriented x 3. Psych:  Normal affect.   Impression & Recommendations:  Problem # 1:  CORONARY ATHEROSLERO UNSPEC TYPE BYPASS GRAFT (ICD-414.05) Patient is stable post-CABG.  EF preserved on pre-op LV-gram.  No ischemic symptoms since discharge.  Continue ASA, metoprolol, Crestor. Holding off on ACEI for now with lowish BP.  Needs lipids/LFTs with goal LDL < 70.   Problem # 2:  ATRIAL FIBRILLATION (ICD-427.31) Patient had post-operative atrial fibrillation.  No further atrial fibrillation noted.  Patient is off amiodarone.   Problem # 3:  TOBACCO ABUSE (ICD-305.1) Patient remains abstinent from smoking.   Problem # 4:  THYROID Labs in 11/11 were consistent with mild hypothyroidism.  Patient has been off amiodarone for a number of weeks now.  Will repeat TSH, free T4, and free T3.  If still abnormal, will start levothyroxine.   Other Orders: EKG w/ Interpretation (93000)  Patient Instructions: 1)  Return for FASTING labwork on Tues 06/30/10: lipid/liver/tsh/free T3/ free T4 (414.05).- lab opens at 8:30am. 2)  Your physician recommends that you continue on your current medications as directed. Please refer to the Current Medication list given to you today. 3)  Your physician wants you to follow-up in: 6 months.  You will receive a reminder letter in the mail two months in advance. If you don't receive a letter, please call our office to schedule the follow-up appointment.

## 2010-09-08 LAB — BASIC METABOLIC PANEL
Calcium: 8.3 mg/dL — ABNORMAL LOW (ref 8.4–10.5)
Chloride: 107 mEq/L (ref 96–112)
Creatinine, Ser: 0.88 mg/dL (ref 0.4–1.5)
GFR calc Af Amer: >60 mL/min (ref >60)
GFR calc non Af Amer: >60 mL/min (ref >60)

## 2010-09-08 LAB — BRAIN NATRIURETIC PEPTIDE: Pro B Natriuretic peptide (BNP): 464 pg/mL — ABNORMAL HIGH (ref 0.0–100.0)

## 2010-09-08 LAB — GLUCOSE, CAPILLARY: Glucose-Capillary: 109 mg/dL — ABNORMAL HIGH (ref 70–99)

## 2010-09-09 LAB — APTT: aPTT: 30 seconds (ref 24–37)

## 2010-09-09 LAB — BASIC METABOLIC PANEL
BUN: 12 mg/dL (ref 6–23)
BUN: 13 mg/dL (ref 6–23)
BUN: 17 mg/dL (ref 6–23)
CO2: 29 mEq/L (ref 19–32)
CO2: 30 mEq/L (ref 19–32)
Calcium: 8.2 mg/dL — ABNORMAL LOW (ref 8.4–10.5)
Calcium: 8.4 mg/dL (ref 8.4–10.5)
Chloride: 104 mEq/L (ref 96–112)
Chloride: 104 mEq/L (ref 96–112)
Chloride: 106 mEq/L (ref 96–112)
Chloride: 111 mEq/L (ref 96–112)
Creatinine, Ser: 0.82 mg/dL (ref 0.4–1.5)
Creatinine, Ser: 0.85 mg/dL (ref 0.4–1.5)
GFR calc Af Amer: >60 mL/min (ref >60)
GFR calc Af Amer: >60 mL/min (ref >60)
GFR calc Af Amer: >60 mL/min (ref >60)
GFR calc non Af Amer: >60 mL/min (ref >60)
GFR calc non Af Amer: >60 mL/min (ref >60)
Glucose, Bld: 116 mg/dL — ABNORMAL HIGH (ref 70–99)
Glucose, Bld: 91 mg/dL (ref 70–99)
Potassium: 3.6 mEq/L (ref 3.5–5.1)
Potassium: 3.6 mEq/L (ref 3.5–5.1)
Potassium: 4 mEq/L (ref 3.5–5.1)
Potassium: 4.3 mEq/L (ref 3.5–5.1)
Sodium: 136 mEq/L (ref 135–145)
Sodium: 138 mEq/L (ref 135–145)
Sodium: 139 mEq/L (ref 135–145)
Sodium: 141 mEq/L (ref 135–145)

## 2010-09-09 LAB — POCT I-STAT, CHEM 8
Calcium, Ion: 1.19 mmol/L (ref 1.12–1.32)
Chloride: 101 mEq/L (ref 96–112)
Creatinine, Ser: 0.9 mg/dL (ref 0.4–1.5)
Glucose, Bld: 127 mg/dL — ABNORMAL HIGH (ref 70–99)
Glucose, Bld: 134 mg/dL — ABNORMAL HIGH (ref 70–99)
HCT: 33 % — ABNORMAL LOW (ref 39.0–52.0)
Hemoglobin: 10.9 g/dL — ABNORMAL LOW (ref 13.0–17.0)
Hemoglobin: 11.2 g/dL — ABNORMAL LOW (ref 13.0–17.0)
Potassium: 3.8 mEq/L (ref 3.5–5.1)
Potassium: 4.2 mEq/L (ref 3.5–5.1)

## 2010-09-09 LAB — BLOOD GAS, ARTERIAL
Bicarbonate: 28.7 mEq/L — ABNORMAL HIGH (ref 20.0–24.0)
Drawn by: 337321
FIO2: 0.21 %
O2 Saturation: 96.6 %
Patient temperature: 98.6
pH, Arterial: 7.439 (ref 7.350–7.450)
pO2, Arterial: 79.1 mmHg — ABNORMAL LOW (ref 80.0–100.0)

## 2010-09-09 LAB — POCT I-STAT 3, ART BLOOD GAS (G3+)
Acid-base deficit: 1 mmol/L (ref 0.0–2.0)
Acid-base deficit: 2 mmol/L (ref 0.0–2.0)
Bicarbonate: 24.1 mEq/L — ABNORMAL HIGH (ref 20.0–24.0)
Bicarbonate: 24.5 mEq/L — ABNORMAL HIGH (ref 20.0–24.0)
Bicarbonate: 26.3 mEq/L — ABNORMAL HIGH (ref 20.0–24.0)
O2 Saturation: 96 %
O2 Saturation: 96 %
O2 Saturation: 96 %
Patient temperature: 35.1
Patient temperature: 37.5
TCO2: 25 mmol/L (ref 0–100)
TCO2: 26 mmol/L (ref 0–100)
TCO2: 26 mmol/L (ref 0–100)
TCO2: 27 mmol/L (ref 0–100)
pCO2 arterial: 34 mmHg — ABNORMAL LOW (ref 35.0–45.0)
pCO2 arterial: 39.4 mmHg (ref 35.0–45.0)
pCO2 arterial: 40 mmHg (ref 35.0–45.0)
pCO2 arterial: 51.5 mmHg — ABNORMAL HIGH (ref 35.0–45.0)
pCO2 arterial: 54 mmHg — ABNORMAL HIGH (ref 35.0–45.0)
pH, Arterial: 7.266 — ABNORMAL LOW (ref 7.350–7.450)
pH, Arterial: 7.281 — ABNORMAL LOW (ref 7.350–7.450)
pH, Arterial: 7.433 (ref 7.350–7.450)
pH, Arterial: 7.468 — ABNORMAL HIGH (ref 7.350–7.450)
pO2, Arterial: 364 mmHg — ABNORMAL HIGH (ref 80.0–100.0)
pO2, Arterial: 97 mmHg (ref 80.0–100.0)

## 2010-09-09 LAB — PROTIME-INR
INR: 1 (ref 0.00–1.49)
INR: 1.37 (ref 0.00–1.49)
Prothrombin Time: 13.4 seconds (ref 11.6–15.2)

## 2010-09-09 LAB — COMPREHENSIVE METABOLIC PANEL
ALT: 22 U/L (ref 0–53)
AST: 24 U/L (ref 0–37)
Albumin: 3.5 g/dL (ref 3.5–5.2)
BUN: 16 mg/dL (ref 6–23)
CO2: 27 mEq/L (ref 19–32)
Calcium: 9 mg/dL (ref 8.4–10.5)
Chloride: 101 mEq/L (ref 96–112)
Creatinine, Ser: 0.9 mg/dL (ref 0.4–1.5)
GFR calc Af Amer: >60 mL/min (ref >60)
GFR calc non Af Amer: >60 mL/min (ref >60)
Glucose, Bld: 135 mg/dL — ABNORMAL HIGH (ref 70–99)
Sodium: 135 mEq/L (ref 135–145)
Total Bilirubin: 0.9 mg/dL (ref 0.3–1.2)
Total Protein: 6 g/dL (ref 6.0–8.3)

## 2010-09-09 LAB — CBC
HCT: 28.5 % — ABNORMAL LOW (ref 39.0–52.0)
HCT: 29.3 % — ABNORMAL LOW (ref 39.0–52.0)
HCT: 29.9 % — ABNORMAL LOW (ref 39.0–52.0)
HCT: 30.5 % — ABNORMAL LOW (ref 39.0–52.0)
HCT: 30.7 % — ABNORMAL LOW (ref 39.0–52.0)
HCT: 33.3 % — ABNORMAL LOW (ref 39.0–52.0)
HCT: 42.4 % (ref 39.0–52.0)
Hemoglobin: 10.2 g/dL — ABNORMAL LOW (ref 13.0–17.0)
Hemoglobin: 10.3 g/dL — ABNORMAL LOW (ref 13.0–17.0)
Hemoglobin: 13.7 g/dL (ref 13.0–17.0)
Hemoglobin: 14.2 g/dL (ref 13.0–17.0)
Hemoglobin: 9.7 g/dL — ABNORMAL LOW (ref 13.0–17.0)
Hemoglobin: 9.8 g/dL — ABNORMAL LOW (ref 13.0–17.0)
MCH: 32.6 pg (ref 26.0–34.0)
MCH: 32.6 pg (ref 26.0–34.0)
MCH: 32.7 pg (ref 26.0–34.0)
MCH: 32.7 pg (ref 26.0–34.0)
MCHC: 33.4 g/dL (ref 30.0–36.0)
MCHC: 33.4 g/dL (ref 30.0–36.0)
MCHC: 33.8 g/dL (ref 30.0–36.0)
MCHC: 33.9 g/dL (ref 30.0–36.0)
MCHC: 34 g/dL (ref 30.0–36.0)
MCV: 95.3 fL (ref 78.0–100.0)
MCV: 96 fL (ref 78.0–100.0)
MCV: 96.1 fL (ref 78.0–100.0)
MCV: 97.3 fL (ref 78.0–100.0)
MCV: 97.4 fL (ref 78.0–100.0)
MCV: 98.1 fL (ref 78.0–100.0)
Platelets: 109 10*3/uL — ABNORMAL LOW (ref 150–400)
Platelets: 113 10*3/uL — ABNORMAL LOW (ref 150–400)
Platelets: 192 10*3/uL (ref 150–400)
Platelets: 198 10*3/uL (ref 150–400)
Platelets: 91 10*3/uL — ABNORMAL LOW (ref 150–400)
Platelets: 94 10*3/uL — ABNORMAL LOW (ref 150–400)
RBC: 2.97 MIL/uL — ABNORMAL LOW (ref 4.22–5.81)
RBC: 3.01 MIL/uL — ABNORMAL LOW (ref 4.22–5.81)
RBC: 3.13 MIL/uL — ABNORMAL LOW (ref 4.22–5.81)
RBC: 3.43 MIL/uL — ABNORMAL LOW (ref 4.22–5.81)
RDW: 12.4 % (ref 11.5–15.5)
RDW: 12.4 % (ref 11.5–15.5)
RDW: 12.6 % (ref 11.5–15.5)
RDW: 12.6 % (ref 11.5–15.5)
RDW: 12.6 % (ref 11.5–15.5)
RDW: 12.8 % (ref 11.5–15.5)
RDW: 12.8 % (ref 11.5–15.5)
WBC: 11.2 10*3/uL — ABNORMAL HIGH (ref 4.0–10.5)
WBC: 11.7 10*3/uL — ABNORMAL HIGH (ref 4.0–10.5)
WBC: 11.8 10*3/uL — ABNORMAL HIGH (ref 4.0–10.5)
WBC: 11.9 10*3/uL — ABNORMAL HIGH (ref 4.0–10.5)
WBC: 6.3 10*3/uL (ref 4.0–10.5)
WBC: 7.5 10*3/uL (ref 4.0–10.5)
WBC: 8.5 10*3/uL (ref 4.0–10.5)

## 2010-09-09 LAB — GLUCOSE, CAPILLARY
Glucose-Capillary: 100 mg/dL — ABNORMAL HIGH (ref 70–99)
Glucose-Capillary: 100 mg/dL — ABNORMAL HIGH (ref 70–99)
Glucose-Capillary: 117 mg/dL — ABNORMAL HIGH (ref 70–99)
Glucose-Capillary: 119 mg/dL — ABNORMAL HIGH (ref 70–99)
Glucose-Capillary: 127 mg/dL — ABNORMAL HIGH (ref 70–99)
Glucose-Capillary: 131 mg/dL — ABNORMAL HIGH (ref 70–99)
Glucose-Capillary: 158 mg/dL — ABNORMAL HIGH (ref 70–99)
Glucose-Capillary: 175 mg/dL — ABNORMAL HIGH (ref 70–99)

## 2010-09-09 LAB — POCT I-STAT 4, (NA,K, GLUC, HGB,HCT)
Glucose, Bld: 113 mg/dL — ABNORMAL HIGH (ref 70–99)
Glucose, Bld: 115 mg/dL — ABNORMAL HIGH (ref 70–99)
Glucose, Bld: 116 mg/dL — ABNORMAL HIGH (ref 70–99)
Glucose, Bld: 87 mg/dL (ref 70–99)
HCT: 28 % — ABNORMAL LOW (ref 39.0–52.0)
HCT: 31 % — ABNORMAL LOW (ref 39.0–52.0)
HCT: 31 % — ABNORMAL LOW (ref 39.0–52.0)
HCT: 39 % (ref 39.0–52.0)
Hemoglobin: 10.5 g/dL — ABNORMAL LOW (ref 13.0–17.0)
Hemoglobin: 12.2 g/dL — ABNORMAL LOW (ref 13.0–17.0)
Hemoglobin: 13.3 g/dL (ref 13.0–17.0)
Hemoglobin: 9.5 g/dL — ABNORMAL LOW (ref 13.0–17.0)
Potassium: 3.8 mEq/L (ref 3.5–5.1)
Potassium: 3.9 mEq/L (ref 3.5–5.1)
Sodium: 134 mEq/L — ABNORMAL LOW (ref 135–145)
Sodium: 140 mEq/L (ref 135–145)

## 2010-09-09 LAB — CROSSMATCH
ABO/RH(D): B POS
Antibody Screen: NEGATIVE
Unit division: 0

## 2010-09-09 LAB — CREATININE, SERUM
Creatinine, Ser: 0.79 mg/dL (ref 0.4–1.5)
Creatinine, Ser: 0.84 mg/dL (ref 0.4–1.5)
GFR calc non Af Amer: >60 mL/min (ref >60)

## 2010-09-09 LAB — URINALYSIS, MICROSCOPIC ONLY
Protein, ur: NEGATIVE mg/dL
Urobilinogen, UA: 0.2 mg/dL (ref 0.0–1.0)

## 2010-09-09 LAB — POCT I-STAT 3, VENOUS BLOOD GAS (G3P V)
O2 Saturation: 75 %
TCO2: 26 mmol/L (ref 0–100)
pCO2, Ven: 41 mmHg — ABNORMAL LOW (ref 45.0–50.0)
pH, Ven: 7.381 — ABNORMAL HIGH (ref 7.250–7.300)
pO2, Ven: 40 mmHg (ref 30.0–45.0)

## 2010-09-09 LAB — MAGNESIUM: Magnesium: 3.1 mg/dL — ABNORMAL HIGH (ref 1.5–2.5)

## 2010-09-09 LAB — TSH: TSH: 2.319 u[IU]/mL (ref 0.350–4.500)

## 2010-09-09 LAB — PLATELET COUNT: Platelets: 122 10*3/uL — ABNORMAL LOW (ref 150–400)

## 2010-09-09 LAB — HEMOGLOBIN AND HEMATOCRIT, BLOOD: HCT: 30.1 % — ABNORMAL LOW (ref 39.0–52.0)

## 2010-09-09 LAB — ABO/RH: ABO/RH(D): B POS

## 2010-10-28 ENCOUNTER — Telehealth: Payer: Self-pay | Admitting: Cardiology

## 2010-10-28 NOTE — Telephone Encounter (Signed)
Pt wants rx prescritpion to be mail to his home. Pt is going to use mail order.

## 2010-10-28 NOTE — Telephone Encounter (Signed)
Refill - crestor [facility]. [facility] [address] [phone].

## 2010-10-30 ENCOUNTER — Other Ambulatory Visit: Payer: Self-pay | Admitting: *Deleted

## 2010-10-30 MED ORDER — ROSUVASTATIN CALCIUM 20 MG PO TABS: 20.0000 mg | ORAL_TABLET | Freq: Every day | ORAL | Status: DC

## 2010-10-30 NOTE — Telephone Encounter (Signed)
Gave pt a call he states he wants his med prescription mailed to his house I filled it as #90 with 3 refills

## 2010-11-10 NOTE — Assessment & Plan Note (Signed)
OFFICE VISIT   [name] [name], [name] [name]  DOB:  [birth year]                                        May 18, 2010  CHART #:  [id number]   HISTORY:  The patient is a 65 year old gentleman, who had coronary  artery bypass grafting x4 on April 24, 2010.  He had presented with  classical anginal symptoms and had three-vessel disease with normal left  ventricular function, had a catheterization.  Postoperatively, he had  some atrial fibrillation that resolved with amiodarone.  He did not  require anticoagulation and he was discharged home without any major  complications.  The patient states that he has been doing well.  He  still has some soreness in his chest, but he has not taken any pain  medication as this may be the second or third day he was at home.  He  has had a persistent chronic cough.  He says it feels more like a tickle  in his throat.  He  does not feel like it is coming from down deep.  He  has also had some congestion in his ears.  He has not had any fevers or  chills.  His cough has been nonproductive.  He states that he is walking  about a half an hour at a time on a daily basis and overall feels well.   CURRENT MEDICATIONS:  1. Amiodarone 200 mg b.i.d.  2. Crestor 20 mg daily.  3. Aspirin 325 mg daily.  4. Wellbutrin XL 150 mg b.i.d.  5. Metoprolol 25 mg b.i.d.  6. Multivitamin 1 daily.  7. Ranitidine 150 mg b.i.d.   ALLERGIES:  He has no known drug allergies.   PHYSICAL EXAMINATION:  General:  The patient is a well-appearing 65-year-  old gentleman, in no acute distress.  Vital Signs:  His blood pressure  is 132/75, pulse is 60, respirations are 18, and oxygen saturation is  98% on room air.  Lungs:  Clear to auscultation and percussion with  equal breath sounds bilaterally.  Cardiac:  Regular rate and rhythm.  Normal S1 and S2.  There is no rub or murmur.  Sternum is stable.  Sternal incision is clean, dry, and intact.  Chest tube sites  are  healing well.  Extremities:  He has no peripheral edema.  Leg incisions  are healing well.   DIAGNOSTIC TESTS:  Chest x-ray shows good aeration of the lungs.  There  are trace effusions bilaterally.   IMPRESSION:  The patient is a 65 year old gentleman status post coronary  artery bypass grafting.  He is now about 3 weeks out from surgery.  I  advised him not to lift any objects weigh greater than 10 pounds for  another 3 weeks.  He may begin driving.  Appropriate precautions were  discussed.  He did have some transient atrial fibrillation in the  hospital and was sent home on amiodarone.  He says he has enough of that  to get him through about Wednesday of this week.  We were going to stop  that at 4 weeks anyway, so rather than have him refill a prescription  for 2 or 3 days.  He is going to go ahead and stop the amiodarone once  his current prescription runs out.  He had questions about traveling to  [place] around Christmas, I  do not see any problem with that, that is a  month away.  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  He will continue to be followed by Dr.  Shirlee Latch and Dr. Para March.  I would be happy to see him back anytime if I  can be of any further assistance with his care.   Salvatore Decent Dorris Fetch, M.D.  Electronically Signed   SCH/MEDQ  D:  05/18/2010  T:  05/18/2010  Job:  [id number]   cc:   Marca Ancona, MD  Dwana Curd. Para March, M.D.

## 2010-11-30 ENCOUNTER — Encounter: Payer: Self-pay | Admitting: Cardiology

## 2010-12-17 ENCOUNTER — Ambulatory Visit: Payer: Self-pay | Admitting: Cardiology

## 2010-12-21 ENCOUNTER — Encounter: Payer: Self-pay | Admitting: Cardiology

## 2010-12-21 ENCOUNTER — Ambulatory Visit (INDEPENDENT_AMBULATORY_CARE_PROVIDER_SITE_OTHER): Payer: Medicare Other | Admitting: Cardiology

## 2010-12-21 ENCOUNTER — Telehealth: Payer: Self-pay | Admitting: Cardiology

## 2010-12-21 DIAGNOSIS — I10 Essential (primary) hypertension: Secondary | ICD-10-CM

## 2010-12-21 DIAGNOSIS — E7800 Pure hypercholesterolemia, unspecified: Secondary | ICD-10-CM

## 2010-12-21 DIAGNOSIS — I25810 Atherosclerosis of coronary artery bypass graft(s) without angina pectoris: Secondary | ICD-10-CM

## 2010-12-21 DIAGNOSIS — E785 Hyperlipidemia, unspecified: Secondary | ICD-10-CM

## 2010-12-21 DIAGNOSIS — Z79899 Other long term (current) drug therapy: Secondary | ICD-10-CM

## 2010-12-21 DIAGNOSIS — I4891 Unspecified atrial fibrillation: Secondary | ICD-10-CM

## 2010-12-21 MED ORDER — LISINOPRIL 2.5 MG PO TABS: 2.5000 mg | ORAL_TABLET | Freq: Every day | ORAL | Status: DC

## 2010-12-21 NOTE — Telephone Encounter (Signed)
[facility] [place]  [facility] LISINOPRIL 2.5 MG AT PT'S REQUEST .Zack Seal

## 2010-12-21 NOTE — Patient Instructions (Addendum)
Your physician recommends that you schedule a follow-up appointment in:  6 MONTHS WITH DR [facility]  Your physician has recommended you make the following change in your medication: LISINOPRIL 2.5  MG   Your physician recommends that you return for lab work in:  2 WEEKS  BMET LIPID AND LIVER  DX 414.05 V58.69  272.0

## 2010-12-21 NOTE — Telephone Encounter (Signed)
New lisinorpril 2.[facility]. [facility] [address] [phone].

## 2010-12-22 DIAGNOSIS — E785 Hyperlipidemia, unspecified: Secondary | ICD-10-CM | POA: Insufficient documentation

## 2010-12-22 NOTE — Assessment & Plan Note (Signed)
Check lipids, goal LDL < 70.  

## 2010-12-22 NOTE — Assessment & Plan Note (Signed)
No atrial fibrillation has been noted except immediately post-operatively.

## 2010-12-22 NOTE — Assessment & Plan Note (Signed)
Stable with no significant ischemic symptoms.  Can decrease ASA to 162 mg daily. Continue Crestor and Toprol XL. He has more BP room now, so I will start lisinopril 2.5 mg daily for secondary prevention.  BMET in 2 wks.

## 2010-12-22 NOTE — Progress Notes (Signed)
PCP: Dr. Para March  65 yo with history of HTN, COPD and smoking initially presented with exertional chest pain.  Left heart cath was done, showing severe 3 vessel disease.  Patient therefore underwent CABG in 10/11.  Postoperative course was complicated by transient atrial fibrillation.  He continues in sinus rhythm today.  He has been doing well in general since getting home.  He has retired since the last time I saw him.  He has stayed off tobacco.  He is walking 4 miles/day at [employer] with his wife.  No significant exertional dyspnea or chest pain with this.  Very mild dyspnea with walking up a hill.    ECG: NSR at 55, normal  Labs (7/11): LDL 122, HDL 44, K 4.1, creatinine 0.9 Labs (11/11): TSH 6.34 (increased), free T3 low, free T4 normal, LFTs normal, K 4.4, creatinine 1.2 Labs (1/12): LDL 74, HDL 42, TSH normal  Allergies (verified):  No Known Drug Allergies  Past Medical History: 1. COPD (ICD-496): quit smoking 10/11.  2. ERECTILE DYSFUNCTION, ORGANIC (ICD-607.84) 3. HYPERTENSION (ICD-401.9) 4. GERD (ICD-530.81) 5. CAD: Exertional chest pain prompted LHC (10/11) showing EF 55%, mild inferior hypokinesis, 90% prox RCA, 70% mid RCA, 80% distal RCA, 70% ostial PDA, 70% mPLV, 90% mid OM1 (large), 90-95% prox LAD.  Patient had CABG with LIMA-LAD, SVG-OM1, seq SVG-PDA/PLV.  6. Atrial fibrillation: Brief, post-op CABG.   Family History: Family History of Arthritis, parents Family History Hypertension, parents Father: Died of lung cancer Mother: Died of unknown causes in a rest home - sounds like possible sudden cardiac death at age 73. Siblings: None  Social History: Quit smoking 10/11.  Alcohol use-no Drug use-no Regular exercise-yes Caffeinated beverages - yes Dentures - Yes Marital Status: Married Children:  3 who are well  Retired from [employer].   Current Outpatient Prescriptions  Medication Sig Dispense Refill  . aspirin 325 MG tablet Take 325 mg by mouth daily.         . Cholecalciferol (VITAMIN D) 1000 UNITS capsule Take 1,000 Units by mouth daily.        . diphenhydrAMINE (SOMINEX) 25 MG tablet Take 25 mg by mouth as directed.        . metoprolol succinate (TOPROL-XL) 25 MG 24 hr tablet Take 25 mg by mouth 2 (two) times daily.        . Multiple Vitamin (MULTIVITAMIN) tablet Take 1 tablet by mouth daily.        . nitroGLYCERIN (NITROSTAT) 0.4 MG SL tablet Place 0.4 mg under the tongue every 5 (five) minutes as needed. May repeat up to 3 doses.       . polycarbophil (FIBERCON) 625 MG tablet Take 625 mg by mouth as directed.        . rosuvastatin (CRESTOR) 20 MG tablet Take 1 tablet (20 mg total) by mouth at bedtime.  90 tablet  2  . sildenafil (VIAGRA) 50 MG tablet Take 50 mg by mouth daily as needed.        . vitamin E 100 UNIT capsule Take 100 Units by mouth daily.        [name] lisinopril (PRINIVIL,ZESTRIL) 2.5 MG tablet Take 1 tablet (2.5 mg total) by mouth daily.  30 tablet  11    BP 128/66  Pulse 55  Ht 5\' 9"  (1.753 m)  Wt 179 lb (81.194 kg)  BMI 26.43 kg/m2 General:  Well developed, well nourished, in no acute distress. Neck:  Neck supple, no JVD. No masses, thyromegaly or abnormal  cervical nodes. Lungs:  Clear bilaterally to auscultation and percussion. Heart:  Non-displaced PMI, chest non-tender; regular rate and rhythm, S1, S2 without murmurs, rubs.  +S4. Carotid upstroke normal, no bruit.  Pedals normal pulses. No edema, no varicosities. Abdomen:  Bowel sounds positive; abdomen soft and non-tender without masses, organomegaly, or hernias noted. No hepatosplenomegaly. Extremities:  No clubbing or cyanosis. Neurologic:  Alert and oriented x 3. Psych:  Normal affect.

## 2011-01-04 ENCOUNTER — Other Ambulatory Visit (INDEPENDENT_AMBULATORY_CARE_PROVIDER_SITE_OTHER): Payer: Medicare Other | Admitting: *Deleted

## 2011-01-04 DIAGNOSIS — E7800 Pure hypercholesterolemia, unspecified: Secondary | ICD-10-CM

## 2011-01-04 DIAGNOSIS — Z79899 Other long term (current) drug therapy: Secondary | ICD-10-CM

## 2011-01-04 LAB — LIPID PANEL
LDL Cholesterol: 44 mg/dL (ref 0–99)
VLDL: 23.2 mg/dL (ref 0.0–40.0)

## 2011-01-04 LAB — BASIC METABOLIC PANEL
BUN: 15 mg/dL (ref 6–23)
Chloride: 103 mEq/L (ref 96–112)
Creatinine, Ser: 0.8 mg/dL (ref 0.4–1.5)
GFR: 108.7 mL/min (ref >60.00)
Glucose, Bld: 104 mg/dL — ABNORMAL HIGH (ref 70–99)
Potassium: 3.8 mEq/L (ref 3.5–5.1)

## 2011-01-04 LAB — HEPATIC FUNCTION PANEL
Bilirubin, Direct: 0.1 mg/dL (ref 0.0–0.3)
Total Bilirubin: 1.1 mg/dL (ref 0.3–1.2)

## 2011-02-04 ENCOUNTER — Other Ambulatory Visit: Payer: Self-pay | Admitting: Cardiology

## 2011-02-04 DIAGNOSIS — I10 Essential (primary) hypertension: Secondary | ICD-10-CM

## 2011-02-04 MED ORDER — LISINOPRIL 2.5 MG PO TABS: 2.5000 mg | ORAL_TABLET | Freq: Every day | ORAL | Status: DC

## 2011-02-04 NOTE — Telephone Encounter (Signed)
Per pt calling pt wants a 90 day supply with 3 refills [facility] [place]

## 2011-02-09 ENCOUNTER — Other Ambulatory Visit: Payer: Self-pay | Admitting: Cardiology

## 2011-02-09 DIAGNOSIS — I10 Essential (primary) hypertension: Secondary | ICD-10-CM

## 2011-02-09 NOTE — Telephone Encounter (Signed)
Pt called back and would like this recalled in for a 90 day supply with 3 refills.  Was called in, but only for 30 days.  Wants to get all medicine on the same schedule.  Call pt when this is done.  [phone]

## 2011-02-11 MED ORDER — LISINOPRIL 2.5 MG PO TABS: 2.5000 mg | ORAL_TABLET | Freq: Every day | ORAL | Status: DC

## 2011-04-14 ENCOUNTER — Telehealth: Payer: Self-pay | Admitting: Cardiology

## 2011-04-14 DIAGNOSIS — I10 Essential (primary) hypertension: Secondary | ICD-10-CM

## 2011-04-14 NOTE — Telephone Encounter (Signed)
Metoprolol 25 mg 90 days supply, uses [facility]

## 2011-04-15 ENCOUNTER — Other Ambulatory Visit: Payer: Self-pay | Admitting: *Deleted

## 2011-04-15 MED ORDER — METOPROLOL SUCCINATE ER 25 MG PO TB24: 25.0000 mg | ORAL_TABLET | Freq: Two times a day (BID) | ORAL | Status: DC

## 2011-04-15 NOTE — Telephone Encounter (Signed)
Refill sent to pharmacy.   

## 2011-04-16 MED ORDER — METOPROLOL TARTRATE 25 MG PO TABS: 25.0000 mg | ORAL_TABLET | Freq: Two times a day (BID) | ORAL | Status: DC

## 2011-04-16 NOTE — Telephone Encounter (Signed)
Pt has question regarding  Medication, metoprolol 25 mg. It was called in extended release. Cost is high.

## 2011-04-16 NOTE — Telephone Encounter (Signed)
Patient states a refill for Metoprolol succinate 25 mg once a day was send to the  Pharmacy, he has never taken the succinate he has always taken Metoprolol tartrate 25 mg twice a day. Also the extended release is too expensive. According to the 06/25/10 O/v patient was taken the Metoprolol tartrate 25 mg twice. The change happened on 11/30/10 abstract. A prescription send to [facility] per request.

## 2011-06-01 ENCOUNTER — Encounter: Payer: Self-pay | Admitting: Cardiology

## 2011-06-01 ENCOUNTER — Ambulatory Visit (INDEPENDENT_AMBULATORY_CARE_PROVIDER_SITE_OTHER): Payer: Medicare Other | Admitting: Cardiology

## 2011-06-01 VITALS — BP 124/74 | HR 53 | Ht 66.0 in | Wt 179.8 lb

## 2011-06-01 DIAGNOSIS — I4891 Unspecified atrial fibrillation: Secondary | ICD-10-CM

## 2011-06-01 DIAGNOSIS — I25810 Atherosclerosis of coronary artery bypass graft(s) without angina pectoris: Secondary | ICD-10-CM

## 2011-06-01 DIAGNOSIS — E785 Hyperlipidemia, unspecified: Secondary | ICD-10-CM

## 2011-06-01 LAB — BASIC METABOLIC PANEL
BUN: 19 mg/dL (ref 6–23)
CO2: 27 mEq/L (ref 19–32)
Calcium: 9.5 mg/dL (ref 8.4–10.5)
Creatinine, Ser: 0.8 mg/dL (ref 0.4–1.5)
Glucose, Bld: 98 mg/dL (ref 70–99)

## 2011-06-01 LAB — LIPID PANEL
Cholesterol: 113 mg/dL (ref 0–200)
HDL: 36.3 mg/dL — ABNORMAL LOW (ref >39.00)
Triglycerides: 93 mg/dL (ref 0.0–149.0)
VLDL: 18.6 mg/dL (ref 0.0–40.0)

## 2011-06-01 NOTE — Patient Instructions (Signed)
Your physician recommends that you have a FASTING lipid profile/ BMET today  414.05  Your physician wants you to follow-up in: 1 year with Dr Shirlee Latch.(December 2013).You will receive a reminder letter in the mail two months in advance. If you don't receive a letter, please call our office to schedule the follow-up appointment.

## 2011-06-02 NOTE — Assessment & Plan Note (Signed)
No atrial fibrillation has been noted except immediately post-operatively.

## 2011-06-02 NOTE — Assessment & Plan Note (Signed)
Goal LDL < 70, will repeat lipids.

## 2011-06-02 NOTE — Assessment & Plan Note (Signed)
Stable with no significant ischemic symptoms.  Continue ASA, Crestor, lisinopril, and Toprol XL.

## 2011-06-02 NOTE — Progress Notes (Signed)
PCP: Dr. Para March  65 yo with history of HTN, COPD and smoking initially presented with exertional chest pain.  Left heart cath was done, showing severe 3 vessel disease.  Patient therefore underwent CABG in 10/11.  Postoperative course was complicated by transient atrial fibrillation.  He continues in sinus rhythm today.  He has been doing well in general since getting home.  He is now retired.  He has stayed off tobacco.  He is walking 4 miles/day at [employer] with his wife.  No significant exertional dyspnea or chest pain with this.  Very mild dyspnea with walking up a hill.    ECG: NSR, LAE  Labs (7/11): LDL 122, HDL 44, K 4.1, creatinine 0.9 Labs (11/11): TSH 6.34 (increased), free T3 low, free T4 normal, LFTs normal, K 4.4, creatinine 1.2 Labs (1/12): LDL 74, HDL 42, TSH normal Labs (7/12): K 3.8, creatinine 0.8, LDL 44, HDL 41  Allergies (verified):  No Known Drug Allergies  Past Medical History: 1. COPD (ICD-496): quit smoking 10/11.  2. ERECTILE DYSFUNCTION, ORGANIC (ICD-607.84) 3. HYPERTENSION (ICD-401.9) 4. GERD (ICD-530.81) 5. CAD: Exertional chest pain prompted LHC (10/11) showing EF 55%, mild inferior hypokinesis, 90% prox RCA, 70% mid RCA, 80% distal RCA, 70% ostial PDA, 70% mPLV, 90% mid OM1 (large), 90-95% prox LAD.  Patient had CABG with LIMA-LAD, SVG-OM1, seq SVG-PDA/PLV.  6. Atrial fibrillation: Brief, post-op CABG.   Family History: Family History of Arthritis, parents Family History Hypertension, parents Father: Died of lung cancer Mother: Died of unknown causes in a rest home - sounds like possible sudden cardiac death at age 21. Siblings: None  Social History: Quit smoking 10/11.  Alcohol use-no Drug use-no Regular exercise-yes Caffeinated beverages - yes Dentures - Yes Marital Status: Married Children:  3 who are well  Retired from [employer].   Current Outpatient Prescriptions  Medication Sig Dispense Refill  . aspirin EC 81 MG tablet Take 162 mg  by mouth daily.        . Cholecalciferol (VITAMIN D) 1000 UNITS capsule Take 1,000 Units by mouth daily.        . diphenhydrAMINE (SOMINEX) 25 MG tablet Take 25 mg by mouth as directed.        [name] lisinopril (PRINIVIL,ZESTRIL) 2.5 MG tablet Take 1 tablet (2.5 mg total) by mouth daily.  90 tablet  3  . metoprolol tartrate (LOPRESSOR) 25 MG tablet Take 1 tablet (25 mg total) by mouth 2 (two) times daily.  180 tablet  3  . Multiple Vitamin (MULTIVITAMIN) tablet Take 1 tablet by mouth daily.        . nitroGLYCERIN (NITROSTAT) 0.4 MG SL tablet Place 0.4 mg under the tongue every 5 (five) minutes as needed. May repeat up to 3 doses.       . polycarbophil (FIBERCON) 625 MG tablet Take 625 mg by mouth as directed.        . rosuvastatin (CRESTOR) 20 MG tablet Take 1 tablet (20 mg total) by mouth at bedtime.  90 tablet  2  . sildenafil (VIAGRA) 50 MG tablet Take 50 mg by mouth daily as needed.        . vitamin E 100 UNIT capsule Take 100 Units by mouth daily.          BP 124/74  Pulse 53  Ht 5\' 6"  (1.676 m)  Wt 81.557 kg (179 lb 12.8 oz)  BMI 29.02 kg/m2 General:  Well developed, well nourished, in no acute distress. Neck:  Neck supple, no JVD.  No masses, thyromegaly or abnormal cervical nodes. Lungs:  Clear bilaterally to auscultation and percussion. Heart:  Non-displaced PMI, chest non-tender; regular rate and rhythm, S1, S2 without murmurs, rubs.  +S4. Carotid upstroke normal, no bruit.  Pedals normal pulses. No edema, no varicosities. Abdomen:  Bowel sounds positive; abdomen soft and non-tender without masses, organomegaly, or hernias noted. No hepatosplenomegaly. Extremities:  No clubbing or cyanosis. Neurologic:  Alert and oriented x 3. Psych:  Normal affect.

## 2011-06-04 ENCOUNTER — Telehealth: Payer: Self-pay | Admitting: Cardiology

## 2011-06-04 NOTE — Telephone Encounter (Signed)
New message:  Had lab work on Tuesday and would like the results.  Please call [phone].

## 2011-06-04 NOTE — Telephone Encounter (Signed)
Notified pt of lab results 

## 2011-06-07 NOTE — Telephone Encounter (Signed)
LMTCB

## 2011-06-14 NOTE — Telephone Encounter (Signed)
Answering machine. 

## 2011-06-15 ENCOUNTER — Telehealth: Payer: Self-pay | Admitting: Cardiology

## 2011-06-15 NOTE — Telephone Encounter (Signed)
Patient wants a new prescription for crestor mailed to him.States he wants prescription to say crestor 40 mg daily #90 with 3 refills.States he knows to take 1/2 tablet daily.

## 2011-06-15 NOTE — Telephone Encounter (Signed)
Fu call Pt returning call from yesterday

## 2011-06-16 ENCOUNTER — Telehealth: Payer: Self-pay | Admitting: Cardiology

## 2011-06-16 NOTE — Telephone Encounter (Signed)
Pt requested Crestor 20 mg be changed to 40 mg since he takes half a tab wants to break in half, but we called in 90 day supply and insurance gave him 45 pills, he wanted 90 to cut in half to last longer, he wants to [place] to tell the pharmacy his dosage is 1 tab vs 1/2 tab, uses [facility], pls call if can call in or not

## 2011-06-16 NOTE — Telephone Encounter (Signed)
Mailed prescription to pt for Crestor 40mg  #90 one -half tablet daily 3 refills.

## 2011-06-16 NOTE — Telephone Encounter (Signed)
I talked with pt. Pt requesting new prescription for Crestor 20mg  #90 3 refills. I will mail pt a prescription for this.

## 2011-07-09 ENCOUNTER — Telehealth: Payer: Self-pay | Admitting: Cardiology

## 2011-07-09 NOTE — Telephone Encounter (Signed)
New Problem  Patient would like a return call concerning medication.

## 2011-07-09 NOTE — Telephone Encounter (Signed)
F/U  Patient returned call from Nurse AL.

## 2011-07-09 NOTE — Telephone Encounter (Signed)
LMTCB 04/28/12

## 2011-07-12 NOTE — Telephone Encounter (Signed)
I talked with pt. Pt had questions about Crestor prescription.

## 2011-07-12 NOTE — Telephone Encounter (Signed)
I reviewed with Dr Shirlee Latch. Dr Shirlee Latch states that I cannot give pt a prescription for Crestor 40mg  with the directions one daily when pt takes Crestor 20mg  daily. I left a message for pt to let him know this.

## 2011-07-12 NOTE — Telephone Encounter (Signed)
Pt takes Crestor 20mg  daily. Pt asking for a prescription for Crestor 40mg  daily with directions of one daily and he will take 1/2 daily.

## 2011-09-09 ENCOUNTER — Encounter: Payer: Self-pay | Admitting: Family Medicine

## 2011-09-09 ENCOUNTER — Ambulatory Visit (INDEPENDENT_AMBULATORY_CARE_PROVIDER_SITE_OTHER): Payer: Medicare Other | Admitting: Family Medicine

## 2011-09-09 VITALS — BP 110/70 | HR 55 | Temp 97.7°F | Wt 186.5 lb

## 2011-09-09 DIAGNOSIS — R310 Gross hematuria: Secondary | ICD-10-CM | POA: Insufficient documentation

## 2011-09-09 DIAGNOSIS — R319 Hematuria, unspecified: Secondary | ICD-10-CM

## 2011-09-09 DIAGNOSIS — R300 Dysuria: Secondary | ICD-10-CM

## 2011-09-09 LAB — POCT URINALYSIS DIPSTICK
Glucose, UA: NEGATIVE
Protein, UA: NEGATIVE
Spec Grav, UA: <=1.005
Urobilinogen, UA: 0.2
pH, UA: 6

## 2011-09-09 NOTE — Progress Notes (Signed)
3 weeks ago he urinated and then saw dark brown urine.  It got better the next few times he urinated and then resolved. He had another episode with one urination 1 week ago.  Former smoker.  No burning with urination.  Stream is good.  No other changes in the meantime.   Meds, vitals, and allergies reviewed.   ROS: See HPI.  Otherwise, noncontributory.  nad ncat Rrr, midline sternotomy noted ctab abd soft Testes bilaterally descended without nodularity, tenderness or masses. No scrotal masses or lesions. No penis lesions or urethral discharge. Prostate gland firm and smooth, no enlargement, nodularity, tenderness, mass, asymmetry or induration.  Ua with trace blood today.  Micro with 5-10 RBC per HPF.  UCX pending.

## 2011-09-09 NOTE — Assessment & Plan Note (Signed)
Refer to uro.  Presumed 2 episodes in a former smoker. D/w pt.  He agrees.  Check ucx in meantime.  This may be related to ASA use, but will not stop this med for now.

## 2011-09-09 NOTE — Patient Instructions (Addendum)
See Shirlee Limerick about your referral before you leave today. Take care.  We'll contact you with your lab report.

## 2011-09-11 LAB — URINE CULTURE
Colony Count: NO GROWTH
Organism ID, Bacteria: NO GROWTH

## 2011-09-19 ENCOUNTER — Telehealth: Payer: Self-pay | Admitting: Family Medicine

## 2011-09-19 DIAGNOSIS — R911 Solitary pulmonary nodule: Secondary | ICD-10-CM

## 2011-09-19 NOTE — Telephone Encounter (Signed)
Call pt.  CT report with small pulmonary nodule and this needs f/u CT scan in 1 year.  Thanks.

## 2011-09-20 ENCOUNTER — Encounter: Payer: Self-pay | Admitting: Family Medicine

## 2011-09-20 NOTE — Telephone Encounter (Signed)
LMOVM to return call.

## 2011-09-21 ENCOUNTER — Telehealth: Payer: Self-pay | Admitting: Cardiology

## 2011-09-21 NOTE — Telephone Encounter (Signed)
Patient advised.

## 2011-09-21 NOTE — Telephone Encounter (Signed)
Patient called, stated he saw PCP for blood in his urine.States was sent to a urologist and test were done.States everything checked out fine,was told if had another occurrence he needed to stop aspirin for 1 week. States he has had  small amount of bright red blood in urine today.States takes 2 baby aspirin daily ,wanted to check with Dr.Mclean before he stopped aspirin.Fowarded to Dr.Mclean for advice.

## 2011-09-21 NOTE — Telephone Encounter (Signed)
Pt was notified.  

## 2011-09-21 NOTE — Telephone Encounter (Signed)
He can hold ASA x 5 days, go back on ASA 81 mg daily (rather than 162 mg daily)

## 2011-09-21 NOTE — Telephone Encounter (Signed)
Please return call to patient at hM# [phone]  Patient has questions about aspirin RX instructions, please return call to patient at [phone].

## 2011-10-26 ENCOUNTER — Telehealth: Payer: Self-pay | Admitting: Cardiology

## 2011-10-26 NOTE — Telephone Encounter (Signed)
Spoke with pt. He will decrease aspirin to 81mg  every other day. He will also follow-up with his urologist.

## 2011-10-26 NOTE — Telephone Encounter (Signed)
Spoke with pt. Pt states he was having hematuria about 1 month ago. Dr Shirlee Latch had recommended pt hold aspirin for 5 days, then resume aspirin 81mg  daily instead of 162mg  daily. Pt resumed aspirin 81mg  daily after holding for 5 days. He did this for about 5 weeks, then had hematuria again. He held his aspirin for 2 days and the hematuria resolved. He resumed aspirin and after 2 days had hematuria again. Pt states his kidneys and bladder checked out OK by the urologist. Pt asking if Dr Shirlee Latch has any other recommendations for his hematuria. Should he stop aspirin permanently? I will forward to Dr Shirlee Latch for review.

## 2011-10-26 NOTE — Telephone Encounter (Signed)
Would not stop ASA permanently.  Would have him go back to urologist.  Decrease ASA to 81 every other day for now.

## 2011-10-26 NOTE — Telephone Encounter (Signed)
Pt calling re symptoms he is having

## 2011-12-24 ENCOUNTER — Other Ambulatory Visit: Payer: Self-pay | Admitting: Cardiology

## 2011-12-24 DIAGNOSIS — I10 Essential (primary) hypertension: Secondary | ICD-10-CM

## 2011-12-24 MED ORDER — LISINOPRIL 2.5 MG PO TABS: 2.5000 mg | ORAL_TABLET | Freq: Every day | ORAL | Status: DC

## 2011-12-24 NOTE — Telephone Encounter (Signed)
Refill -Lisinopril (PRINIVIL,ZESTRIL) 2.5 MG table (90 Day refill plz)

## 2011-12-27 ENCOUNTER — Telehealth: Payer: Self-pay | Admitting: Family Medicine

## 2011-12-27 NOTE — Telephone Encounter (Signed)
Needs HAV and zostavax. Please talk to me about this tomorrow so we can get the order sets ready.  Thanks.  Okay to give both to him.

## 2011-12-28 ENCOUNTER — Other Ambulatory Visit: Payer: Self-pay | Admitting: Family Medicine

## 2011-12-28 MED ORDER — ZOSTER VACCINE LIVE 19400 UNT/0.65ML ~~LOC~~ SOLR: 0.6500 mL | Freq: Once | SUBCUTANEOUS | Status: AC

## 2011-12-28 NOTE — Telephone Encounter (Signed)
Appt made on RN schedule.  Patient wants to get Hep A vaccine here and get a Rx written to carry to pharmacy for Zostavax.  Order set done along with Rx in [facility].

## 2011-12-31 ENCOUNTER — Ambulatory Visit (INDEPENDENT_AMBULATORY_CARE_PROVIDER_SITE_OTHER): Payer: Medicare Other

## 2011-12-31 DIAGNOSIS — Z23 Encounter for immunization: Secondary | ICD-10-CM

## 2012-03-21 ENCOUNTER — Other Ambulatory Visit: Payer: Self-pay | Admitting: Cardiology

## 2012-05-01 ENCOUNTER — Encounter: Payer: Self-pay | Admitting: Cardiology

## 2012-05-01 ENCOUNTER — Ambulatory Visit (INDEPENDENT_AMBULATORY_CARE_PROVIDER_SITE_OTHER): Payer: Medicare Other | Admitting: Cardiology

## 2012-05-01 ENCOUNTER — Ambulatory Visit: Payer: Medicare Other | Admitting: Cardiology

## 2012-05-01 VITALS — BP 114/72 | HR 52 | Ht 66.0 in | Wt 184.0 lb

## 2012-05-01 DIAGNOSIS — I4891 Unspecified atrial fibrillation: Secondary | ICD-10-CM

## 2012-05-01 DIAGNOSIS — I25810 Atherosclerosis of coronary artery bypass graft(s) without angina pectoris: Secondary | ICD-10-CM

## 2012-05-01 DIAGNOSIS — E785 Hyperlipidemia, unspecified: Secondary | ICD-10-CM

## 2012-05-01 LAB — LIPID PANEL
Cholesterol: 101 mg/dL (ref 0–200)
HDL: 35.3 mg/dL — ABNORMAL LOW
LDL Cholesterol: 46 mg/dL (ref 0–99)
Total CHOL/HDL Ratio: 3
Triglycerides: 101 mg/dL (ref 0.0–149.0)
VLDL: 20.2 mg/dL (ref 0.0–40.0)

## 2012-05-01 LAB — BASIC METABOLIC PANEL
BUN: 16 mg/dL (ref 6–23)
CO2: 25 mEq/L (ref 19–32)
Chloride: 102 mEq/L (ref 96–112)
Glucose, Bld: 103 mg/dL — ABNORMAL HIGH (ref 70–99)
Potassium: 3.9 mEq/L (ref 3.5–5.1)

## 2012-05-01 MED ORDER — LISINOPRIL 5 MG PO TABS: 5.0000 mg | ORAL_TABLET | Freq: Every day | ORAL | Status: DC

## 2012-05-01 MED ORDER — NITROGLYCERIN 0.4 MG SL SUBL: 0.4000 mg | SUBLINGUAL_TABLET | SUBLINGUAL | Status: DC | PRN

## 2012-05-01 MED ORDER — ROSUVASTATIN CALCIUM 40 MG PO TABS: ORAL_TABLET | ORAL | Status: DC

## 2012-05-01 MED ORDER — METOPROLOL TARTRATE 25 MG PO TABS: ORAL_TABLET | ORAL | Status: DC

## 2012-05-01 NOTE — Progress Notes (Signed)
Patient ID: [name] [name], male   DOB: 11-05-1945, 66 y.o.   MRN: [id number] PCP: Dr. Para March  66 yo with history of HTN, COPD and smoking initially presented with exertional chest pain.  Left heart cath was done, showing severe 3 vessel disease.  Patient therefore underwent CABG in 10/11.  Postoperative course was complicated by transient atrial fibrillation.  He continues in sinus rhythm today.  He has been doing well in general since getting home.  He is now retired.  He has stayed off tobacco.  He is walking several miles a day for exercise.  No significant exertional dyspnea or chest pain with this.  Very mild dyspnea with walking up a hill.    He has been taking aspirin every other day since 3/13, when he had gross hematuria.  He had a urological workup that was apparently unrevealing.  He also is concerned about his thinning hair, which he suspects is due to his beta blocker.   ECG: NSR, normal  Labs (7/11): LDL 122, HDL 44, K 4.1, creatinine 0.9 Labs (11/11): TSH 6.34 (increased), free T3 low, free T4 normal, LFTs normal, K 4.4, creatinine 1.2 Labs (1/12): LDL 74, HDL 42, TSH normal Labs (7/12): K 3.8, creatinine 0.8, LDL 44, HDL 41 Labs (12/12): K 4.1, creatinine 0.8, LDL 58, HDL 36  Allergies (verified):  No Known Drug Allergies  Past Medical History: 1. COPD (ICD-496): quit smoking 10/11.  2. ERECTILE DYSFUNCTION, ORGANIC (ICD-607.84) 3. HYPERTENSION (ICD-401.9) 4. GERD (ICD-530.81) 5. CAD: Exertional chest pain prompted LHC (10/11) showing EF 55%, mild inferior hypokinesis, 90% prox RCA, 70% mid RCA, 80% distal RCA, 70% ostial PDA, 70% mPLV, 90% mid OM1 (large), 90-95% prox LAD.  Patient had CABG with LIMA-LAD, SVG-OM1, seq SVG-PDA/PLV.  6. Atrial fibrillation: Brief, post-op CABG.  7. Hematuria  Family History: Family History of Arthritis, parents Family History Hypertension, parents Father: Died of lung cancer Mother: Died of unknown causes in a rest home - sounds like  possible sudden cardiac death at age 73. Siblings: None  Social History: Quit smoking 10/11.  Alcohol use-no Drug use-no Regular exercise-yes Caffeinated beverages - yes Dentures - Yes Marital Status: Married Children:  3 who are well  Retired from [employer].   ROS: All systems reviewed and negative except as per HPI.   Current Outpatient Prescriptions  Medication Sig Dispense Refill  . Cholecalciferol (VITAMIN D) 1000 UNITS capsule Take 1,000 Units by mouth daily.        . Multiple Vitamin (MULTIVITAMIN) tablet Take 1 tablet by mouth daily.        . nitroGLYCERIN (NITROSTAT) 0.4 MG SL tablet Place 1 tablet (0.4 mg total) under the tongue every 5 (five) minutes as needed. May repeat up to 3 doses.  100 tablet  3  . polycarbophil (FIBERCON) 625 MG tablet Take 625 mg by mouth as directed.        . sildenafil (VIAGRA) 50 MG tablet Take 50 mg by mouth daily as needed.        . vitamin E 100 UNIT capsule Take 100 Units by mouth daily.        . [DISCONTINUED] aspirin EC 81 MG tablet One every other day      . [DISCONTINUED] lisinopril (PRINIVIL,ZESTRIL) 2.5 MG tablet Take 1 tablet (2.5 mg total) by mouth daily.  90 tablet  3  . [DISCONTINUED] metoprolol tartrate (LOPRESSOR) 25 MG tablet TAKE ONE TABLET BY MOUTH TWICE DAILY  180 tablet  2  . [DISCONTINUED] nitroGLYCERIN (  NITROSTAT) 0.4 MG SL tablet Place 0.4 mg under the tongue every 5 (five) minutes as needed. May repeat up to 3 doses.       . [DISCONTINUED] rosuvastatin (CRESTOR) 20 MG tablet Take 1 tablet (20 mg total) by mouth at bedtime.  90 tablet  2  . [DISCONTINUED] rosuvastatin (CRESTOR) 20 MG tablet Take 20 mg by mouth at bedtime.      [name] aspirin EC 81 MG tablet Take 1 tablet (81 mg total) by mouth daily.      . diphenhydrAMINE (SOMINEX) 25 MG tablet Take 25 mg by mouth as directed.        [name] lisinopril (PRINIVIL,ZESTRIL) 5 MG tablet Take 1 tablet (5 mg total) by mouth daily.  90 tablet  3  . metoprolol tartrate (LOPRESSOR) 25 MG  tablet 1/2 tablet (total 12.5mg ) two times a day  90 tablet  3  . rosuvastatin (CRESTOR) 40 MG tablet 1/2 tablet daily  90 tablet  3  . [DISCONTINUED] metoprolol tartrate (LOPRESSOR) 25 MG tablet 1/2 tablet (total 12.5mg ) two times a day  90 tablet  3  . [DISCONTINUED] rosuvastatin (CRESTOR) 40 MG tablet 1/2 tablet daily  90 tablet  3    BP 114/72  Pulse 52  Ht 5\' 6"  (1.676 m)  Wt 184 lb (83.462 kg)  BMI 29.70 kg/m2 General:  Well developed, well nourished, in no acute distress. Neck:  Neck supple, no JVD. No masses, thyromegaly or abnormal cervical nodes. Lungs:  Clear bilaterally to auscultation and percussion. Heart:  Non-displaced PMI, chest non-tender; regular rate and rhythm, S1, S2 without murmurs, rubs.  +S4. Carotid upstroke normal, no bruit.  Pedals normal pulses. No edema, no varicosities. Abdomen:  Bowel sounds positive; abdomen soft and non-tender without masses, organomegaly, or hernias noted. No hepatosplenomegaly. Extremities:  No clubbing or cyanosis. Neurologic:  Alert and oriented x 3. Psych:  Normal affect.  Assessment/Plan:  Hyperlipidemia  Goal LDL < 70, will check lipids.  CAD Stable with no significant ischemic symptoms.  Given concern about thinning hair, I will decrease metoprolol to 12.5 mg bid and increase lisinopril to 5 mg daily.  I would also like him to go back to taking ASA 81 mg daily.  If the hematuria returns, he can hold aspirin for a few days and go back to taking it every other day.  ATRIAL FIBRILLATION  No atrial fibrillation has been noted except immediately post-operatively after CABG.  Marca Ancona 05/01/2012

## 2012-05-01 NOTE — Patient Instructions (Addendum)
Decrease metoprolol to 12.5mg  two times a day. This will be one-half 25mg  tablet two times a day.   Increase lisinopril to 5mg  daily. You can take two 2.5mg  tablets daily at the same time and use your current supply.  Take aspirin 81mg  every day.  Your physician recommends that you have  lab work today--lipid profile/BMET.  Your physician wants you to follow-up in: 1 year with Dr Shirlee Latch. (November 2014). You will receive a reminder letter in the mail two months in advance. If you don't receive a letter, please call our office to schedule the follow-up appointment.

## 2012-08-12 ENCOUNTER — Other Ambulatory Visit: Payer: Self-pay

## 2012-08-26 ENCOUNTER — Telehealth: Payer: Self-pay | Admitting: Family Medicine

## 2012-08-26 NOTE — Telephone Encounter (Signed)
Call pt.  CT 2013 with small pulmonary nodule and this needs f/u CT scan.  Order is in. Thanks.

## 2012-08-28 NOTE — Telephone Encounter (Signed)
Patient advised.

## 2012-08-29 ENCOUNTER — Telehealth: Payer: Self-pay | Admitting: *Deleted

## 2012-08-29 NOTE — Telephone Encounter (Signed)
I phoned the patient yesterday as you requested to tell him about getting his 6 mos follow up CT done.  I told him that Shirlee Limerick would probably be phoning him in a day or two to get this set up.  He called back today and LM saying that no one has called.  Is the order in?

## 2012-08-30 NOTE — Telephone Encounter (Signed)
Order is in. [name] [name] please let me know if you need anything else and then call pt.  Thanks.

## 2012-08-31 ENCOUNTER — Ambulatory Visit (INDEPENDENT_AMBULATORY_CARE_PROVIDER_SITE_OTHER)
Admission: RE | Admit: 2012-08-31 | Discharge: 2012-08-31 | Disposition: A | Discharge status: Home / Self Care | Payer: Medicare Other | Source: Ambulatory Visit | Attending: Family Medicine | Admitting: Family Medicine

## 2012-08-31 DIAGNOSIS — R911 Solitary pulmonary nodule: Secondary | ICD-10-CM

## 2012-09-01 ENCOUNTER — Other Ambulatory Visit: Payer: Medicare Other

## 2012-09-01 ENCOUNTER — Encounter: Payer: Self-pay | Admitting: Family Medicine

## 2012-09-01 ENCOUNTER — Other Ambulatory Visit: Payer: Self-pay | Admitting: Family Medicine

## 2012-09-27 ENCOUNTER — Other Ambulatory Visit: Payer: Self-pay | Admitting: Dermatology

## 2013-01-28 ENCOUNTER — Ambulatory Visit (INDEPENDENT_AMBULATORY_CARE_PROVIDER_SITE_OTHER): Payer: Medicare Other | Admitting: Family Medicine

## 2013-01-28 VITALS — BP 120/62 | HR 72 | Temp 97.6°F | Resp 16 | Ht 68.5 in | Wt 176.0 lb

## 2013-01-28 DIAGNOSIS — M79674 Pain in right toe(s): Secondary | ICD-10-CM

## 2013-01-28 DIAGNOSIS — L600 Ingrowing nail: Secondary | ICD-10-CM

## 2013-01-28 DIAGNOSIS — M79609 Pain in unspecified limb: Secondary | ICD-10-CM

## 2013-01-28 MED ORDER — CEPHALEXIN 500 MG PO CAPS: 500.0000 mg | ORAL_CAPSULE | Freq: Two times a day (BID) | ORAL | Status: DC

## 2013-01-28 NOTE — Progress Notes (Signed)
[facility] [address], [place] [address] [phone]  Date:  01/28/2013   Name:  [name] [name]   DOB:  1946-04-29   MRN:  [id number]  PCP:  Crawford Givens, MD    Chief Complaint: Foot Pain   History of Present Illness:  [name] [name] is a 67 y.o. very pleasant male patient who presents with the following:  He is here today with a right great toenail problem. No injury to the nail or toe that he is aware of.  He otherwise feels well, no fever, etc   He has noted a problem with the nail for about 10 days, worse for the last couple of days His PCP is at [facility].    Patient Active Problem List   Diagnosis Date Noted  . Pulmonary nodule 09/19/2011  . Hematuria 09/09/2011  . Hyperlipidemia 12/22/2010  . CORONARY ATHEROSLERO UNSPEC TYPE BYPASS GRAFT 05/14/2010  . ATRIAL FIBRILLATION 05/14/2010  . CHEST PAIN 04/10/2010  . ARM PAIN, LEFT 04/01/2010  . TOBACCO ABUSE 01/19/2010  . COPD 01/19/2010  . HYPERTENSION 01/06/2010  . GERD 01/06/2010  . ERECTILE DYSFUNCTION, ORGANIC 01/06/2010  . CHICKENPOX, HX OF 01/06/2010    Past Medical History  Diagnosis Date  . COPD (chronic obstructive pulmonary disease)     Quit smoking 10/11  . ED (erectile dysfunction) of organic origin   . Hypertension   . GERD (gastroesophageal reflux disease)   . CAD (coronary artery disease)     Exertional chest pain prompted LHC 10/11 showing EF 55%, mild inferior hypokinesis, 90% prox RCA, 70% mid RCA, 80% distal RCA, 70% ostial PDA, 70% mPLV, 90% mid OM1 (large), 90-95% prox LAD. Pt had CABG with LIMA-LAD, SVG-OMG1, seq SVG-PDA/PLV  . Atrial fibrillation     Breif, post-op CABG    Past Surgical History  Procedure Laterality Date  . Coronary artery bypass graft      LIMA-LAD, SVG-OM1, seq SVG-PDA/PLV    History  Substance Use Topics  . Smoking status: Former Smoker    Quit date: 07/07/2009  . Smokeless tobacco: Not on file  . Alcohol Use: No     Family History  Problem Relation Age of Onset  . Arthritis Mother   . Hypertension Mother   . Arthritis Father   . Hypertension Father   . Lung cancer Father     No Known Allergies  Medication list has been reviewed and updated.  Current Outpatient Prescriptions on File Prior to Visit  Medication Sig Dispense Refill  . aspirin EC 81 MG tablet Take 1 tablet (81 mg total) by mouth daily.      . Cholecalciferol (VITAMIN D) 1000 UNITS capsule Take 1,000 Units by mouth daily.        [name] lisinopril (PRINIVIL,ZESTRIL) 5 MG tablet Take 1 tablet (5 mg total) by mouth daily.  90 tablet  3  . metoprolol tartrate (LOPRESSOR) 25 MG tablet 1/2 tablet (total 12.5mg ) two times a day  90 tablet  3  . Multiple Vitamin (MULTIVITAMIN) tablet Take 1 tablet by mouth daily.        . nitroGLYCERIN (NITROSTAT) 0.4 MG SL tablet Place 1 tablet (0.4 mg total) under the tongue every 5 (five) minutes as needed. May repeat up to 3 doses.  100 tablet  3  . polycarbophil (FIBERCON) 625 MG tablet Take 625 mg by mouth as directed.        . rosuvastatin (CRESTOR) 40 MG tablet 1/2 tablet  daily  90 tablet  3  . sildenafil (VIAGRA) 50 MG tablet Take 50 mg by mouth daily as needed.        . vitamin E 100 UNIT capsule Take 100 Units by mouth daily.        . diphenhydrAMINE (SOMINEX) 25 MG tablet Take 25 mg by mouth as directed.         No current facility-administered medications on file prior to visit.    Review of Systems:  As per HPI- otherwise negative.   Physical Examination: Filed Vitals:   01/28/13 1244  BP: 120/62  Pulse: 72  Temp: 97.6 F (36.4 C)  Resp: 16   Filed Vitals:   01/28/13 1244  Height: 5' 8.5" (1.74 m)  Weight: 176 lb (79.833 kg)   Body mass index is 26.37 kg/(m^2). Ideal Body Weight: Weight in (lb) to have BMI = 25: 166.5  GEN: WDWN, NAD, Non-toxic, A & O x 3 HEENT: Atraumatic, Normocephalic. Neck supple. No masses, No LAD. Ears and Nose: No external deformity. CV: RRR, No  M/G/R. No JVD. No thrill. No extra heart sounds. PULM: CTA B, no wheezes, crackles, rhonchi. No retractions. No resp. distress. No accessory muscle use. EXTR: No c/c/e NEURO Normal gait.  PSYCH: Normally interactive. Conversant. Not depressed or anxious appearing.  Calm demeanor.  Right great toe: he has an infected appearing ingrown great toenail- ingrown at the lateral nail edge.  otherwise foot is normal, no other redness or swelling, normal pulses  See procedure note per Porfirio Oar, PA-C.    Assessment and Plan: Great toe pain, right - Plan: cephALEXin (KEFLEX) 500 MG capsule  Ingrown right big toenail  Ingrown toenail.  Treated with keflex for infection, s/p wedge resection.  He will follow-up as needed.  Reminded that if he takes OTC NSAIDS for pain he needs to take them temporarily separate from his ASA to prevent deactivation of his aspirin.   Signed Abbe Amsterdam, MD

## 2013-01-28 NOTE — Patient Instructions (Addendum)
Use the antibiotic as directed.  If your toe is not feeling better please let us know- Sooner if worse.

## 2013-01-28 NOTE — Progress Notes (Signed)
Procedure: consent obtained from patient for left great toe ingrown nail wedge resection. Area cleaned with alcohol. Digital block performed with 5cc of 2% lidocaine. Area cleaned with betadine, draped. Nail lifter used to lift medial third of nail. Nail cutter used to cut off ingrown nail. Xeroform placed. Cleansed. Dressing placed. Wound care discussed.

## 2013-01-28 NOTE — Progress Notes (Signed)
I directly supervised and participated in the procedure and agree with the student's documentation.  

## 2013-01-31 ENCOUNTER — Other Ambulatory Visit: Payer: Self-pay

## 2013-02-09 ENCOUNTER — Telehealth: Payer: Self-pay

## 2013-02-09 MED ORDER — HEPATITIS A VACCINE 1440 EL U/ML IM SUSP: 1.0000 mL | Freq: Once | INTRAMUSCULAR | Status: DC

## 2013-02-09 NOTE — Telephone Encounter (Signed)
pt left v/m that he is due for booster shot of  Hep A and request prescription for Hep A sent to [facility] on [address].Please advise.

## 2013-02-09 NOTE — Telephone Encounter (Signed)
Sent. Thanks.   

## 2013-04-17 ENCOUNTER — Other Ambulatory Visit: Payer: Self-pay | Admitting: Cardiology

## 2013-05-03 ENCOUNTER — Other Ambulatory Visit: Payer: Self-pay

## 2013-05-09 ENCOUNTER — Encounter: Payer: Self-pay | Admitting: Cardiology

## 2013-05-09 ENCOUNTER — Ambulatory Visit (INDEPENDENT_AMBULATORY_CARE_PROVIDER_SITE_OTHER): Payer: Medicare Other | Admitting: Cardiology

## 2013-05-09 VITALS — BP 120/62 | HR 58 | Ht 68.5 in | Wt 179.8 lb

## 2013-05-09 DIAGNOSIS — I4891 Unspecified atrial fibrillation: Secondary | ICD-10-CM

## 2013-05-09 DIAGNOSIS — I25810 Atherosclerosis of coronary artery bypass graft(s) without angina pectoris: Secondary | ICD-10-CM

## 2013-05-09 DIAGNOSIS — E785 Hyperlipidemia, unspecified: Secondary | ICD-10-CM

## 2013-05-09 LAB — HEPATIC FUNCTION PANEL
ALT: 29 U/L (ref 0–53)
AST: 29 U/L (ref 0–37)
Albumin: 4 g/dL (ref 3.5–5.2)

## 2013-05-09 LAB — BASIC METABOLIC PANEL
Calcium: 9.6 mg/dL (ref 8.4–10.5)
GFR: 94.86 mL/min (ref >60.00)
Sodium: 136 mEq/L (ref 135–145)

## 2013-05-09 LAB — LIPID PANEL
Cholesterol: 115 mg/dL (ref 0–200)
HDL: 44.6 mg/dL (ref >39.00)
Triglycerides: 98 mg/dL (ref 0.0–149.0)

## 2013-05-09 NOTE — Patient Instructions (Signed)
Your physician recommends that you have a FASTING lipid profile /liver profile/BMET today.   Your physician wants you to follow-up in: 1 year with Dr Shirlee Latch. (November 2015).  You will receive a reminder letter in the mail two months in advance. If you don't receive a letter, please call our office to schedule the follow-up appointment.

## 2013-05-09 NOTE — Progress Notes (Signed)
Patient ID: [name] [name], male   DOB: [birth year], 67 y.o.   MRN: [id number] PCP: Dr. Para March  67 yo with history of HTN, COPD and smoking initially presented with exertional chest pain.  Left heart cath was done, showing severe 3 vessel disease.  Patient therefore underwent CABG in 10/11.  Postoperative course was complicated by transient atrial fibrillation.  He continues in sinus rhythm today.  He has been doing well in general.  He is now retired.  He has stayed off tobacco.  He is walking several miles a day for exercise.  No significant exertional dyspnea or chest pain with this.  Very mild dyspnea with walking up a hill.     ECG: NSR, normal  Labs (7/11): LDL 122, HDL 44, K 4.1, creatinine 0.9 Labs (11/11): TSH 6.34 (increased), free T3 low, free T4 normal, LFTs normal, K 4.4, creatinine 1.2 Labs (1/12): LDL 74, HDL 42, TSH normal Labs (7/12): K 3.8, creatinine 0.8, LDL 44, HDL 41 Labs (12/12): K 4.1, creatinine 0.8, LDL 58, HDL 36 Labs (11/13): LDL 46, HDL 35, K 3.9, creatinine 0.9  Allergies (verified):  No Known Drug Allergies  Past Medical History: 1. COPD (ICD-496): quit smoking 10/11.  2. ERECTILE DYSFUNCTION, ORGANIC (ICD-607.84) 3. HYPERTENSION (ICD-401.9) 4. GERD (ICD-530.81) 5. CAD: Exertional chest pain prompted LHC (10/11) showing EF 55%, mild inferior hypokinesis, 90% prox RCA, 70% mid RCA, 80% distal RCA, 70% ostial PDA, 70% mPLV, 90% mid OM1 (large), 90-95% prox LAD.  Patient had CABG with LIMA-LAD, SVG-OM1, seq SVG-PDA/PLV.  6. Atrial fibrillation: Brief, post-op CABG.  7. Hematuria  Family History: Family History of Arthritis, parents Family History Hypertension, parents Father: Died of lung cancer Mother: Died of unknown causes in a rest home - sounds like possible sudden cardiac death at age 36. Siblings: None  Social History: Quit smoking 10/11.  Alcohol use-no Drug use-no Regular exercise-yes Caffeinated beverages - yes Dentures - Yes Marital  Status: Married Children:  3 who are well  Retired from [employer].   Current Outpatient Prescriptions  Medication Sig Dispense Refill  . aspirin EC 81 MG tablet Take 1 tablet (81 mg total) by mouth daily.      . Cholecalciferol (VITAMIN D) 1000 UNITS capsule Take 1,000 Units by mouth daily.        . hepatitis A virus, PF, vaccine (HAVRIX, PF,) 1440 EL U/ML injection Inject 1 mL (1,440 Units total) into the muscle once.  1 mL  0  . lisinopril (PRINIVIL,ZESTRIL) 5 MG tablet Take one tablet by mouth one time daily  90 tablet  0  . metoprolol tartrate (LOPRESSOR) 25 MG tablet 1/2 tablet (total 12.5mg ) two times a day  90 tablet  3  . Multiple Vitamin (MULTIVITAMIN) tablet Take 1 tablet by mouth daily.        . nitroGLYCERIN (NITROSTAT) 0.4 MG SL tablet Place 1 tablet (0.4 mg total) under the tongue every 5 (five) minutes as needed. May repeat up to 3 doses.  100 tablet  3  . polycarbophil (FIBERCON) 625 MG tablet Take 625 mg by mouth as directed.        . rosuvastatin (CRESTOR) 40 MG tablet 1/2 tablet daily  90 tablet  3  . sildenafil (VIAGRA) 50 MG tablet Take 50 mg by mouth daily as needed.        . vitamin E 100 UNIT capsule Take 100 Units by mouth daily.         No current facility-administered medications for this  visit.    BP 120/62  Pulse 58  Ht 5' 8.5" (1.74 m)  Wt 179 lb 12.8 oz (81.557 kg)  BMI 26.94 kg/m2 General:  Well developed, well nourished, in no acute distress. Neck:  Neck supple, no JVD. No masses, thyromegaly or abnormal cervical nodes. Lungs:  Clear bilaterally to auscultation and percussion. Heart:  Non-displaced PMI, chest non-tender; regular rate and rhythm, S1, S2 without murmurs, rubs.  +S4. Carotid upstroke normal, no bruit.  Pedals normal pulses. No edema, no varicosities. Abdomen:  Bowel sounds positive; abdomen soft and non-tender without masses, organomegaly, or hernias noted. No hepatosplenomegaly. Extremities:  No clubbing or cyanosis. Neurologic:  Alert and  oriented x 3. Psych:  Normal affect.  Assessment/Plan:  Hyperlipidemia  Goal LDL < 70, will check lipids.  CAD Stable with no significant ischemic symptoms.  Continue ASA 81, statin, metoprolol, and lisinopril. ATRIAL FIBRILLATION  No atrial fibrillation has been noted except immediately post-operatively after CABG.  Marca Ancona 05/09/2013

## 2013-07-08 ENCOUNTER — Other Ambulatory Visit: Payer: Self-pay | Admitting: Cardiology

## 2013-08-26 ENCOUNTER — Other Ambulatory Visit: Payer: Self-pay | Admitting: Family Medicine

## 2013-08-26 ENCOUNTER — Telehealth: Payer: Self-pay | Admitting: Family Medicine

## 2013-08-26 NOTE — Telephone Encounter (Signed)
Please call pt.  He is due for a follow up chest CT.  This is to f/u the scan from 1 year ago.  The order is in epic (future order, due ~09/01/13), but I don't think that he is scheduled yet.  Thanks.

## 2013-09-04 ENCOUNTER — Other Ambulatory Visit: Payer: Self-pay | Admitting: Family Medicine

## 2013-09-04 DIAGNOSIS — R911 Solitary pulmonary nodule: Secondary | ICD-10-CM

## 2013-09-13 ENCOUNTER — Telehealth: Payer: Self-pay | Admitting: Family Medicine

## 2013-09-13 ENCOUNTER — Encounter: Payer: Self-pay | Admitting: Family Medicine

## 2013-09-13 ENCOUNTER — Telehealth: Payer: Self-pay | Admitting: *Deleted

## 2013-09-13 ENCOUNTER — Other Ambulatory Visit: Payer: Self-pay | Admitting: *Deleted

## 2013-09-13 NOTE — Telephone Encounter (Signed)
Multiple phone calls and a letter have been directed to patient from staff w/o response from patient.

## 2013-09-17 NOTE — Telephone Encounter (Signed)
Patient did not read MyChart message.  Notification sent on 09/17/13

## 2013-10-18 ENCOUNTER — Telehealth: Payer: Self-pay | Admitting: Cardiology

## 2013-10-18 DIAGNOSIS — E785 Hyperlipidemia, unspecified: Secondary | ICD-10-CM

## 2013-10-18 MED ORDER — ATORVASTATIN CALCIUM 40 MG PO TABS: 40.0000 mg | ORAL_TABLET | Freq: Every day | ORAL | Status: DC

## 2013-10-18 NOTE — Telephone Encounter (Signed)
Returned call to patient Dr.McLean advised when finishes crestor start atorvastatin 40 mg daily.Fasting lipid and hepatic panels scheduled in 2 months 01/08/14.

## 2013-10-18 NOTE — Telephone Encounter (Signed)
Returned call to patient he stated he would like to change crestor 20 mg to atorvastatin due to high cost of crestor.Message sent to [place].

## 2013-10-18 NOTE — Telephone Encounter (Signed)
May start atorvastatin 40 mg daily with lipids/LFTs in 2 months.

## 2013-10-18 NOTE — Telephone Encounter (Signed)
New message     Want to switch from crestor to lipitor. Please call in rx to [facility]/[address]-----90da supply

## 2013-10-22 ENCOUNTER — Ambulatory Visit (INDEPENDENT_AMBULATORY_CARE_PROVIDER_SITE_OTHER)
Admission: RE | Admit: 2013-10-22 | Discharge: 2013-10-22 | Disposition: A | Discharge status: Home / Self Care | Payer: Medicare Other | Source: Ambulatory Visit | Attending: Family Medicine | Admitting: Family Medicine

## 2013-10-22 DIAGNOSIS — R911 Solitary pulmonary nodule: Secondary | ICD-10-CM

## 2013-10-23 ENCOUNTER — Encounter: Payer: Self-pay | Admitting: Family Medicine

## 2014-01-08 ENCOUNTER — Other Ambulatory Visit (INDEPENDENT_AMBULATORY_CARE_PROVIDER_SITE_OTHER): Payer: Medicare Other

## 2014-01-08 DIAGNOSIS — E785 Hyperlipidemia, unspecified: Secondary | ICD-10-CM

## 2014-01-08 LAB — HEPATIC FUNCTION PANEL
ALT: 27 U/L (ref 0–53)
AST: 30 U/L (ref 0–37)
Albumin: 4 g/dL (ref 3.5–5.2)
Alkaline Phosphatase: 62 U/L (ref 39–117)
Bilirubin, Direct: 0.1 mg/dL (ref 0.0–0.3)
TOTAL PROTEIN: 6.9 g/dL (ref 6.0–8.3)
Total Bilirubin: 1.3 mg/dL — ABNORMAL HIGH (ref 0.2–1.2)

## 2014-01-08 LAB — LIPID PANEL
Cholesterol: 104 mg/dL (ref 0–200)
HDL: 44.9 mg/dL (ref >39.00)
LDL CALC: 44 mg/dL (ref 0–99)
NonHDL: 59.1
TRIGLYCERIDES: 74 mg/dL (ref 0.0–149.0)
Total CHOL/HDL Ratio: 2
VLDL: 14.8 mg/dL (ref 0.0–40.0)

## 2014-01-09 ENCOUNTER — Other Ambulatory Visit: Payer: Self-pay | Admitting: Cardiology

## 2014-03-11 ENCOUNTER — Encounter: Payer: Self-pay | Admitting: Cardiology

## 2014-04-08 ENCOUNTER — Other Ambulatory Visit: Payer: Self-pay | Admitting: Cardiology

## 2014-05-28 ENCOUNTER — Ambulatory Visit: Payer: Medicare Other | Admitting: Physician Assistant

## 2014-05-31 ENCOUNTER — Ambulatory Visit: Payer: Medicare Other | Admitting: Cardiology

## 2014-06-06 ENCOUNTER — Ambulatory Visit: Payer: Medicare Other | Admitting: Cardiology

## 2014-06-12 ENCOUNTER — Telehealth: Payer: Self-pay | Admitting: Physician Assistant

## 2014-06-12 NOTE — Progress Notes (Signed)
Cardiology Office Note   Date:  06/13/2014   ID:  [name] [name], DOB 1946-04-02, MRN [id number]  PCP:  [name], MD  Cardiologist:  Dr. Loralie Champagne     History of Present Illness: [name] [name] is a 68 y.o. male with a hx of CAD s/p CABG 03/2010 c/b post op AFib, HTN, HL, COPD.  Last seen by Dr. Loralie Champagne 04/2013.  He returns for FU.  He is doing well.  He walks 2-4 miles a day.  The patient denies chest pain, shortness of breath, syncope, orthopnea, PND or significant pedal edema.    Recent Labs: 01/08/2014: ALT 27; LDL (calc) 44   Estimated Creatinine Clearance: 81.3 mL/min (by C-G formula based on Cr of 0.9).   Recent Radiology: No results found.    Wt Readings from Last 3 Encounters:  06/13/14 185 lb (83.915 kg)  05/09/13 179 lb 12.8 oz (81.557 kg)  01/28/13 176 lb (79.833 kg)     Past Medical History  Diagnosis Date  . COPD (chronic obstructive pulmonary disease)     Quit smoking 10/11  . ED (erectile dysfunction) of organic origin   . Hypertension   . GERD (gastroesophageal reflux disease)   . CAD (coronary artery disease)     Exertional chest pain prompted LHC 10/11 showing EF 55%, mild inferior hypokinesis, 90% prox RCA, 70% mid RCA, 80% distal RCA, 70% ostial PDA, 70% mPLV, 90% mid OM1 (large), 90-95% prox LAD. Pt had CABG with LIMA-LAD, SVG-OMG1, seq SVG-PDA/PLV  . Atrial fibrillation     Breif, post-op CABG    Current Outpatient Prescriptions  Medication Sig Dispense Refill  . aspirin EC 81 MG tablet Take 1 tablet (81 mg total) by mouth daily.    [name] atorvastatin (LIPITOR) 40 MG tablet Take 1 tablet (40 mg total) by mouth daily. 90 tablet 3  . Cholecalciferol (VITAMIN D) 1000 UNITS capsule Take 1,000 Units by mouth daily.      . hepatitis A virus, PF, vaccine (HAVRIX, PF,) 1440 EL U/ML injection Inject 1 mL (1,440 Units total) into the muscle once. 1 mL 0  . lisinopril (PRINIVIL,ZESTRIL) 5 MG tablet TAKE ONE TABLET BY MOUTH ONE TIME DAILY   90 tablet 1  . metoprolol tartrate (LOPRESSOR) 25 MG tablet TAKE 1/2 TABLET BY MOUTH TWICE DAILY  90 tablet 1  . nitroGLYCERIN (NITROSTAT) 0.4 MG SL tablet Place 1 tablet (0.4 mg total) under the tongue every 5 (five) minutes as needed. May repeat up to 3 doses. 100 tablet 3  . polycarbophil (FIBERCON) 625 MG tablet Take 625 mg by mouth as directed.      . sildenafil (VIAGRA) 50 MG tablet Take 50 mg by mouth daily as needed.       No current facility-administered medications for this visit.     Allergies:   Review of patient's allergies indicates no known allergies.   Social History:  The patient  reports that he quit smoking about 4 years ago. He does not have any smokeless tobacco history on file. He reports that he does not drink alcohol or use illicit drugs.  Retired from [employer].  Family History:  The patient's family history includes Arthritis in his father and mother; Hypertension in his father and mother; Lung cancer in his father; Stroke in his maternal uncle. There is no history of Heart attack.    ROS:  Please see the history of present illness.   He continues to note occasional hematuria.  He has  seen urology in the past with a negative workup.    All other systems reviewed and negative.    PHYSICAL EXAM: VS:  BP 120/72 mmHg  Pulse 66  Ht 5' 8.5" (1.74 m)  Wt 185 lb (83.915 kg)  BMI 27.72 kg/m2 Well nourished, well developed, in no acute distress HEENT: normal Neck: no JVD Carotids:  No bruits bilaterally. Cardiac:  normal S1, S2;  RRR;  no murmur   Lungs:   clear to auscultation bilaterally, no wheezing, rhonchi or rales Abd: soft, nontender, no hepatomegaly Ext: no edema Skin: warm and dry Neuro:  CNs 2-12 intact, no focal abnormalities noted  EKG:  NSR, HR 66, normal axis, no change since prior tracing.       ASSESSMENT AND PLAN:   1.  CAD s/p CABG:  No angina.  Continue ASA, statin, beta blocker.  2.  Hyperlipidemia:  Continue statin.      -  Check Lipids  and LFTs today.  3.  Hypertension:  Controlled.      -  BMET today.  4.  Post Op Atrial Fibrillation:   No apparent recurrence. 5.  Hematuria:  I have asked the patient to call his Urologist for FU.  Disposition:   FU with Dr. Loralie Champagne 1 year.    Signed, Versie Starks, MHS 06/13/2014 8:53 AM    [facility] [address], [place], [place]  [address] Phone: [phone]; Fax: [phone]

## 2014-06-12 NOTE — Telephone Encounter (Signed)
I rtnd call to pt who was asking if he should come in fasting for his appt w/PA 12/17 @ 8:30. I said yes that would be good because PA may check FLP/LFT since cholesterol med changed back in the summer. Pt said ok and thank you.

## 2014-06-12 NOTE — Telephone Encounter (Signed)
New Msg     Pt wants to know if he is having labs completed. Did not see it in notes, but pt would still like phn call to clarify.    Please call [phone].

## 2014-06-13 ENCOUNTER — Encounter: Payer: Self-pay | Admitting: Physician Assistant

## 2014-06-13 ENCOUNTER — Ambulatory Visit (INDEPENDENT_AMBULATORY_CARE_PROVIDER_SITE_OTHER): Payer: Medicare Other | Admitting: Physician Assistant

## 2014-06-13 VITALS — BP 120/72 | HR 66 | Ht 68.5 in | Wt 185.0 lb

## 2014-06-13 DIAGNOSIS — I482 Chronic atrial fibrillation, unspecified: Secondary | ICD-10-CM

## 2014-06-13 DIAGNOSIS — I2510 Atherosclerotic heart disease of native coronary artery without angina pectoris: Secondary | ICD-10-CM

## 2014-06-13 DIAGNOSIS — E785 Hyperlipidemia, unspecified: Secondary | ICD-10-CM

## 2014-06-13 DIAGNOSIS — I10 Essential (primary) hypertension: Secondary | ICD-10-CM

## 2014-06-13 LAB — BASIC METABOLIC PANEL
BUN: 19 mg/dL (ref 6–23)
CHLORIDE: 105 meq/L (ref 96–112)
CO2: 24 mEq/L (ref 19–32)
Calcium: 9.6 mg/dL (ref 8.4–10.5)
Creatinine, Ser: 0.9 mg/dL (ref 0.4–1.5)
GFR: 88.53 mL/min (ref >60.00)
Glucose, Bld: 106 mg/dL — ABNORMAL HIGH (ref 70–99)
POTASSIUM: 3.9 meq/L (ref 3.5–5.1)
Sodium: 137 mEq/L (ref 135–145)

## 2014-06-13 LAB — HEPATIC FUNCTION PANEL
ALT: 29 U/L (ref 0–53)
AST: 32 U/L (ref 0–37)
Albumin: 4 g/dL (ref 3.5–5.2)
Alkaline Phosphatase: 59 U/L (ref 39–117)
BILIRUBIN DIRECT: 0.2 mg/dL (ref 0.0–0.3)
TOTAL PROTEIN: 6.8 g/dL (ref 6.0–8.3)
Total Bilirubin: 1.4 mg/dL — ABNORMAL HIGH (ref 0.2–1.2)

## 2014-06-13 LAB — LIPID PANEL
Cholesterol: 126 mg/dL (ref 0–200)
HDL: 40.6 mg/dL (ref >39.00)
LDL Cholesterol: 64 mg/dL (ref 0–99)
NonHDL: 85.4
TRIGLYCERIDES: 107 mg/dL (ref 0.0–149.0)
Total CHOL/HDL Ratio: 3
VLDL: 21.4 mg/dL (ref 0.0–40.0)

## 2014-06-13 MED ORDER — ATORVASTATIN CALCIUM 40 MG PO TABS: 40.0000 mg | ORAL_TABLET | Freq: Every day | ORAL | Status: DC

## 2014-06-13 MED ORDER — METOPROLOL TARTRATE 25 MG PO TABS: 12.5000 mg | ORAL_TABLET | Freq: Two times a day (BID) | ORAL | Status: DC

## 2014-06-13 MED ORDER — LISINOPRIL 5 MG PO TABS: 5.0000 mg | ORAL_TABLET | Freq: Every day | ORAL | Status: DC

## 2014-06-13 NOTE — Patient Instructions (Signed)
LAB WORK TODAY; BMET, FASTING LIPID AND LIVER PANEL  MAKE SURE FOLLOW UP WITH UROLOGIST   Your physician wants you to follow-up in: [place] DR. Aundra Dubin. You will receive a reminder letter in the mail two months in advance. If you don't receive a letter, please call our office to schedule the follow-up appointment.  RX FOR LIPITOR, METOPROLOL, LISINOPRIL WERE PRINTED FOR YOU TODAY

## 2014-12-23 ENCOUNTER — Other Ambulatory Visit: Payer: Self-pay

## 2015-02-26 ENCOUNTER — Encounter: Payer: Self-pay | Admitting: Cardiology

## 2015-02-26 DIAGNOSIS — E785 Hyperlipidemia, unspecified: Secondary | ICD-10-CM

## 2015-03-04 ENCOUNTER — Encounter: Payer: Self-pay | Admitting: *Deleted

## 2015-03-05 ENCOUNTER — Other Ambulatory Visit (INDEPENDENT_AMBULATORY_CARE_PROVIDER_SITE_OTHER): Payer: Medicare Other | Admitting: *Deleted

## 2015-03-05 DIAGNOSIS — E785 Hyperlipidemia, unspecified: Secondary | ICD-10-CM | POA: Diagnosis not present

## 2015-03-05 LAB — LIPID PANEL
CHOL/HDL RATIO: 3
Cholesterol: 118 mg/dL (ref 0–200)
HDL: 39.1 mg/dL (ref >39.00)
LDL CALC: 61 mg/dL (ref 0–99)
NONHDL: 78.96
TRIGLYCERIDES: 89 mg/dL (ref 0.0–149.0)
VLDL: 17.8 mg/dL (ref 0.0–40.0)

## 2015-03-05 LAB — BASIC METABOLIC PANEL
BUN: 18 mg/dL (ref 6–23)
CHLORIDE: 103 meq/L (ref 96–112)
CO2: 27 mEq/L (ref 19–32)
Calcium: 9.4 mg/dL (ref 8.4–10.5)
Creatinine, Ser: 0.96 mg/dL (ref 0.40–1.50)
GFR: 82 mL/min (ref >60.00)
Glucose, Bld: 109 mg/dL — ABNORMAL HIGH (ref 70–99)
Potassium: 3.9 mEq/L (ref 3.5–5.1)
Sodium: 137 mEq/L (ref 135–145)

## 2015-03-05 LAB — HEPATIC FUNCTION PANEL
ALT: 17 U/L (ref 0–53)
AST: 19 U/L (ref 0–37)
Albumin: 3.8 g/dL (ref 3.5–5.2)
Alkaline Phosphatase: 82 U/L (ref 39–117)
BILIRUBIN TOTAL: 0.8 mg/dL (ref 0.2–1.2)
Bilirubin, Direct: 0.2 mg/dL (ref 0.0–0.3)
Total Protein: 6.9 g/dL (ref 6.0–8.3)

## 2015-03-07 ENCOUNTER — Telehealth: Payer: Self-pay

## 2015-03-07 ENCOUNTER — Encounter: Payer: Self-pay | Admitting: Cardiology

## 2015-03-07 ENCOUNTER — Ambulatory Visit (INDEPENDENT_AMBULATORY_CARE_PROVIDER_SITE_OTHER): Payer: Medicare Other | Admitting: Cardiology

## 2015-03-07 VITALS — BP 128/64 | HR 66 | Ht 70.0 in | Wt 180.8 lb

## 2015-03-07 DIAGNOSIS — E785 Hyperlipidemia, unspecified: Secondary | ICD-10-CM

## 2015-03-07 DIAGNOSIS — I10 Essential (primary) hypertension: Secondary | ICD-10-CM | POA: Diagnosis not present

## 2015-03-07 DIAGNOSIS — I25810 Atherosclerosis of coronary artery bypass graft(s) without angina pectoris: Secondary | ICD-10-CM | POA: Diagnosis not present

## 2015-03-07 MED ORDER — NITROGLYCERIN 0.4 MG SL SUBL: 0.4000 mg | SUBLINGUAL_TABLET | SUBLINGUAL | Status: DC | PRN

## 2015-03-07 MED ORDER — METOPROLOL TARTRATE 25 MG PO TABS: 12.5000 mg | ORAL_TABLET | Freq: Two times a day (BID) | ORAL | Status: DC

## 2015-03-07 MED ORDER — ATORVASTATIN CALCIUM 40 MG PO TABS: 40.0000 mg | ORAL_TABLET | Freq: Every day | ORAL | Status: DC

## 2015-03-07 MED ORDER — LISINOPRIL 5 MG PO TABS: 5.0000 mg | ORAL_TABLET | Freq: Every day | ORAL | Status: DC

## 2015-03-07 NOTE — Telephone Encounter (Signed)
SPOKE WITH PT ABPUT LAB RESULTS. PT VERBALIZED UNDERSTANDING AND THAT HE HAD ALREADY SEEN RESULTS ON MY CHART.

## 2015-03-07 NOTE — Patient Instructions (Signed)
Medication Instructions:  No changes today  Labwork: None today  Testing/Procedures: None today  Follow-Up: Your physician wants you to follow-up in: 1 year with Dr McLean. (September 2017). You will receive a reminder letter in the mail two months in advance. If you don't receive a letter, please call our office to schedule the follow-up appointment.      

## 2015-03-07 NOTE — Telephone Encounter (Signed)
-----   Message from Larey Dresser, MD sent at 03/06/2015  9:50 PM EDT ----- Good lipids

## 2015-03-09 NOTE — Progress Notes (Signed)
Patient ID: [name] [name], male   DOB: 05/01/1946, 69 y.o.   MRN: [id number] PCP: Dr. [name]  69 yo with history of HTN, COPD and smoking initially presented with exertional chest pain.  Left heart cath was done, showing severe 3 vessel disease.  Patient therefore underwent CABG in 10/11.  Postoperative course was complicated by transient atrial fibrillation.  He continues in sinus rhythm today.  He has been doing well in general.  He is now retired.  He has stayed off tobacco.  He is walking several miles a day for exercise.  No significant exertional dyspnea or chest pain with this.  Very mild dyspnea with walking up a hill.   Stable weight.  He has ongoing occasional hematuria.  Urology workup has been negative.    ECG: NSR, normal  Labs (7/11): LDL 122, HDL 44, K 4.1, creatinine 0.9 Labs (11/11): TSH 6.34 (increased), free T3 low, free T4 normal, LFTs normal, K 4.4, creatinine 1.2 Labs (1/12): LDL 74, HDL 42, TSH normal Labs (7/12): K 3.8, creatinine 0.8, LDL 44, HDL 41 Labs (12/12): K 4.1, creatinine 0.8, LDL 58, HDL 36 Labs (11/13): LDL 46, HDL 35, K 3.9, creatinine 0.9 Labs (9/16): LDL 61, HDL 39.1, K 3.9, creatinine 0.96  Allergies (verified):  No Known Drug Allergies  Past Medical History: 1. COPD: quit smoking 10/11.  2. ERECTILE DYSFUNCTION 3. HYPERTENSION  4. GERD  5. CAD: Exertional chest pain prompted LHC (10/11) showing EF 55%, mild inferior hypokinesis, 90% prox RCA, 70% mid RCA, 80% distal RCA, 70% ostial PDA, 70% mPLV, 90% mid OM1 (large), 90-95% prox LAD.  Patient had CABG with LIMA-LAD, SVG-OM1, seq SVG-PDA/PLV.  6. Atrial fibrillation: Brief, post-op CABG.  7. Hematuria: negative urology workup.   Family History: Family History of Arthritis, parents Family History Hypertension, parents Father: Died of lung cancer Mother: Died of unknown causes in a rest home - sounds like possible sudden cardiac death at age 39. Siblings: None  Social History: Quit smoking  10/11.  Alcohol use-no Drug use-no Regular exercise-yes Caffeinated beverages - yes Dentures - Yes Marital Status: Married Children:  3 who are well  Retired from [employer].   Current Outpatient Prescriptions  Medication Sig Dispense Refill  . aspirin EC 81 MG tablet Take 1 tablet (81 mg total) by mouth daily.    [name] atorvastatin (LIPITOR) 40 MG tablet Take 1 tablet (40 mg total) by mouth daily. 90 tablet 3  . Cholecalciferol (VITAMIN D) 1000 UNITS capsule Take 1,000 Units by mouth daily.      [name] lisinopril (PRINIVIL,ZESTRIL) 5 MG tablet Take 1 tablet (5 mg total) by mouth daily. 90 tablet 3  . metoprolol tartrate (LOPRESSOR) 25 MG tablet Take 0.5 tablets (12.5 mg total) by mouth 2 (two) times daily. 90 tablet 3  . nitroGLYCERIN (NITROSTAT) 0.4 MG SL tablet Place 1 tablet (0.4 mg total) under the tongue every 5 (five) minutes as needed. May repeat up to 3 doses. 100 tablet 3  . polycarbophil (FIBERCON) 625 MG tablet Take 625 mg by mouth as directed.      . sildenafil (VIAGRA) 50 MG tablet Take 50 mg by mouth as directed.      No current facility-administered medications for this visit.    BP 128/64 mmHg  Pulse 66  Ht 5\' 10"  (1.778 m)  Wt 180 lb 12.8 oz (82.01 kg)  BMI 25.94 kg/m2 General:  Well developed, well nourished, in no acute distress. Neck:  Neck supple, no JVD. No masses,  thyromegaly or abnormal cervical nodes. Lungs:  Clear bilaterally to auscultation and percussion. Heart:  Non-displaced PMI, chest non-tender; regular rate and rhythm, S1, S2 without murmurs, rubs. +S4. Carotid upstroke normal, no bruit.  Pedals normal pulses. No edema, no varicosities. Abdomen:  Bowel sounds positive; abdomen soft and non-tender without masses, organomegaly, or hernias noted. No hepatosplenomegaly. Extremities:  No clubbing or cyanosis. Neurologic:  Alert and oriented x 3. Psych:  Normal affect.  Assessment/Plan:  Hyperlipidemia  Goal LDL < 70, at goal when checked this month.    CAD Stable with no significant ischemic symptoms.  Continue ASA 81, statin, metoprolol, and lisinopril. ATRIAL FIBRILLATION  No atrial fibrillation has been noted except immediately post-operatively after CABG.  Loralie Champagne 03/09/2015

## 2015-03-17 DIAGNOSIS — R319 Hematuria, unspecified: Secondary | ICD-10-CM | POA: Diagnosis not present

## 2015-03-17 DIAGNOSIS — R310 Gross hematuria: Secondary | ICD-10-CM | POA: Diagnosis not present

## 2015-03-24 DIAGNOSIS — N2889 Other specified disorders of kidney and ureter: Secondary | ICD-10-CM | POA: Diagnosis not present

## 2015-03-24 DIAGNOSIS — R310 Gross hematuria: Secondary | ICD-10-CM | POA: Diagnosis not present

## 2015-03-31 DIAGNOSIS — R310 Gross hematuria: Secondary | ICD-10-CM | POA: Diagnosis not present

## 2015-03-31 DIAGNOSIS — N2889 Other specified disorders of kidney and ureter: Secondary | ICD-10-CM | POA: Diagnosis not present

## 2015-04-01 ENCOUNTER — Other Ambulatory Visit: Payer: Self-pay | Admitting: Urology

## 2015-04-01 NOTE — Patient Instructions (Addendum)
[name] [name]  04/01/2015   Your procedure is scheduled on: 04/08/2015    Report to [facility] Main  Entrance take [place]  elevators to 3rd floor to  [place] at       0930 AM.  Call this number if you have problems the morning of surgery [phone]   Remember: ONLY 1 PERSON MAY GO WITH YOU TO SHORT STAY TO GET  READY MORNING OF [place].  Do not eat food or drink liquids :After Midnight.     Take these medicines the morning of surgery with A SIP OF WATER:   Metoprolol ( Lopressor), Zantac                                 You may not have any metal on your body including hair pins and              piercings  Do not wear jewelry, lotions, powders or perfumes, deodorant                          Men may shave face and neck.   Do not bring valuables to the hospital. [place].  Contacts, dentures or bridgework may not be worn into surgery.       Patients discharged the day of surgery will not be allowed to drive home.  Name and phone number of your driver:  Special Instructions: coughing and deep breathing exercises,leg exercises               Please read over the following fact sheets you were given: _____________________________________________________________________             [facility] - Preparing for Surgery Before surgery, you can play an important role.  Because skin is not sterile, your skin needs to be as free of germs as possible.  You can reduce the number of germs on your skin by washing with CHG (chlorahexidine gluconate) soap before surgery.  CHG is an antiseptic cleaner which kills germs and bonds with the skin to continue killing germs even after washing. Please DO NOT use if you have an allergy to CHG or antibacterial soaps.  If your skin becomes reddened/irritated stop using the CHG and inform your nurse when you arrive at Short Stay. Do not shave (including legs and  underarms) for at least 48 hours prior to the first CHG shower.  You may shave your face/neck. Please follow these instructions carefully:  1.  Shower with CHG Soap the night before surgery and the  morning of Surgery.  2.  If you choose to wash your hair, wash your hair first as usual with your  normal  shampoo.  3.  After you shampoo, rinse your hair and body thoroughly to remove the  shampoo.                           4.  Use CHG as you would any other liquid soap.  You can apply chg directly  to the skin and wash  Gently with a scrungie or clean washcloth.  5.  Apply the CHG Soap to your body ONLY FROM THE NECK DOWN.   Do not use on face/ open                           Wound or open sores. Avoid contact with eyes, ears mouth and genitals (private parts).                       Wash face,  Genitals (private parts) with your normal soap.             6.  Wash thoroughly, paying special attention to the area where your surgery  will be performed.  7.  Thoroughly rinse your body with warm water from the neck down.  8.  DO NOT shower/wash with your normal soap after using and rinsing off  the CHG Soap.                9.  Pat yourself dry with a clean towel.            10.  Wear clean pajamas.            11.  Place clean sheets on your bed the night of your first shower and do not  sleep with pets. Day of Surgery : Do not apply any lotions/deodorants the morning of surgery.  Please wear clean clothes to the hospital/surgery center.  FAILURE TO FOLLOW THESE INSTRUCTIONS MAY RESULT IN THE CANCELLATION OF YOUR SURGERY PATIENT SIGNATURE_________________________________  NURSE SIGNATURE__________________________________  ________________________________________________________________________

## 2015-04-01 NOTE — Progress Notes (Signed)
Left message for Noemi Chapel requesting orders for preop appointment.

## 2015-04-02 ENCOUNTER — Encounter (HOSPITAL_COMMUNITY)
Admission: RE | Admit: 2015-04-02 | Discharge: 2015-04-02 | Disposition: A | Discharge status: Home / Self Care | Payer: Medicare Other | Source: Ambulatory Visit | Attending: Urology | Admitting: Urology

## 2015-04-02 ENCOUNTER — Encounter (HOSPITAL_COMMUNITY): Payer: Self-pay

## 2015-04-02 DIAGNOSIS — Z01818 Encounter for other preprocedural examination: Secondary | ICD-10-CM | POA: Diagnosis not present

## 2015-04-02 DIAGNOSIS — N2889 Other specified disorders of kidney and ureter: Secondary | ICD-10-CM | POA: Diagnosis not present

## 2015-04-02 LAB — BASIC METABOLIC PANEL
Anion gap: 7 (ref 5–15)
BUN: 18 mg/dL (ref 6–20)
CO2: 25 mmol/L (ref 22–32)
CREATININE: 0.9 mg/dL (ref 0.61–1.24)
Calcium: 9.4 mg/dL (ref 8.9–10.3)
Chloride: 104 mmol/L (ref 101–111)
Glucose, Bld: 105 mg/dL — ABNORMAL HIGH (ref 65–99)
POTASSIUM: 4.1 mmol/L (ref 3.5–5.1)
SODIUM: 136 mmol/L (ref 135–145)

## 2015-04-02 LAB — CBC
HCT: 41.7 % (ref 39.0–52.0)
HEMOGLOBIN: 13.7 g/dL (ref 13.0–17.0)
MCH: 31.9 pg (ref 26.0–34.0)
MCHC: 32.9 g/dL (ref 30.0–36.0)
MCV: 97 fL (ref 78.0–100.0)
PLATELETS: 218 10*3/uL (ref 150–400)
RBC: 4.3 MIL/uL (ref 4.22–5.81)
RDW: 12.5 % (ref 11.5–15.5)
WBC: 7.5 10*3/uL (ref 4.0–10.5)

## 2015-04-02 LAB — SURGICAL PCR SCREEN
MRSA, PCR: POSITIVE — AB
STAPHYLOCOCCUS AUREUS: POSITIVE — AB

## 2015-04-02 NOTE — Progress Notes (Signed)
EKG-03/07/2015- EPIC  03/07/15- LOV with cardiology in [facility]

## 2015-04-07 DIAGNOSIS — R918 Other nonspecific abnormal finding of lung field: Secondary | ICD-10-CM | POA: Diagnosis not present

## 2015-04-08 ENCOUNTER — Encounter (HOSPITAL_COMMUNITY): Payer: Self-pay

## 2015-04-08 ENCOUNTER — Ambulatory Visit (HOSPITAL_COMMUNITY)
Admission: RE | Admit: 2015-04-08 | Discharge: 2015-04-08 | Disposition: A | Discharge status: Home / Self Care | Payer: Medicare Other | Source: Ambulatory Visit | Attending: Urology | Admitting: Urology

## 2015-04-08 ENCOUNTER — Ambulatory Visit (HOSPITAL_COMMUNITY): Payer: Medicare Other | Admitting: Anesthesiology

## 2015-04-08 ENCOUNTER — Encounter (HOSPITAL_COMMUNITY)
Admission: RE | Disposition: A | Discharge status: Home / Self Care | Payer: Self-pay | Source: Ambulatory Visit | Attending: Urology

## 2015-04-08 DIAGNOSIS — I4891 Unspecified atrial fibrillation: Secondary | ICD-10-CM | POA: Insufficient documentation

## 2015-04-08 DIAGNOSIS — I2510 Atherosclerotic heart disease of native coronary artery without angina pectoris: Secondary | ICD-10-CM | POA: Diagnosis not present

## 2015-04-08 DIAGNOSIS — I10 Essential (primary) hypertension: Secondary | ICD-10-CM | POA: Diagnosis not present

## 2015-04-08 DIAGNOSIS — N528 Other male erectile dysfunction: Secondary | ICD-10-CM | POA: Diagnosis not present

## 2015-04-08 DIAGNOSIS — D4112 Neoplasm of uncertain behavior of left renal pelvis: Secondary | ICD-10-CM | POA: Diagnosis not present

## 2015-04-08 DIAGNOSIS — J449 Chronic obstructive pulmonary disease, unspecified: Secondary | ICD-10-CM | POA: Diagnosis not present

## 2015-04-08 DIAGNOSIS — K219 Gastro-esophageal reflux disease without esophagitis: Secondary | ICD-10-CM | POA: Diagnosis not present

## 2015-04-08 DIAGNOSIS — R918 Other nonspecific abnormal finding of lung field: Secondary | ICD-10-CM | POA: Insufficient documentation

## 2015-04-08 DIAGNOSIS — Z87891 Personal history of nicotine dependence: Secondary | ICD-10-CM | POA: Diagnosis not present

## 2015-04-08 DIAGNOSIS — N2889 Other specified disorders of kidney and ureter: Secondary | ICD-10-CM | POA: Diagnosis not present

## 2015-04-08 DIAGNOSIS — Z79899 Other long term (current) drug therapy: Secondary | ICD-10-CM | POA: Insufficient documentation

## 2015-04-08 DIAGNOSIS — Z7982 Long term (current) use of aspirin: Secondary | ICD-10-CM | POA: Insufficient documentation

## 2015-04-08 DIAGNOSIS — Z951 Presence of aortocoronary bypass graft: Secondary | ICD-10-CM | POA: Diagnosis not present

## 2015-04-08 DIAGNOSIS — R319 Hematuria, unspecified: Secondary | ICD-10-CM | POA: Diagnosis present

## 2015-04-08 HISTORY — PX: CYSTOSCOPY WITH RETROGRADE PYELOGRAM, URETEROSCOPY AND STENT PLACEMENT: SHX5789

## 2015-04-08 SURGERY — CYSTOURETEROSCOPY, WITH RETROGRADE PYELOGRAM AND STENT INSERTION
Anesthesia: General | Laterality: Left

## 2015-04-08 MED ORDER — SODIUM CHLORIDE 0.9 % IR SOLN
Status: DC | PRN
  Administered 2015-04-08: 4000 mL

## 2015-04-08 MED ORDER — KETOROLAC TROMETHAMINE 30 MG/ML IJ SOLN
30.0000 mg | Freq: Once | INTRAMUSCULAR | Status: AC
  Administered 2015-04-08: 30 mg via INTRAVENOUS

## 2015-04-08 MED ORDER — FENTANYL CITRATE (PF) 100 MCG/2ML IJ SOLN
INTRAMUSCULAR | Status: AC
  Filled 2015-04-08: qty 4

## 2015-04-08 MED ORDER — PROPOFOL 10 MG/ML IV BOLUS
INTRAVENOUS | Status: DC | PRN
  Administered 2015-04-08: 150 mg via INTRAVENOUS
  Administered 2015-04-08: 70 mg via INTRAVENOUS
  Administered 2015-04-08: 50 mg via INTRAVENOUS

## 2015-04-08 MED ORDER — CIPROFLOXACIN HCL 500 MG PO TABS: 500.0000 mg | ORAL_TABLET | Freq: Two times a day (BID) | ORAL | Status: DC

## 2015-04-08 MED ORDER — FENTANYL CITRATE (PF) 250 MCG/5ML IJ SOLN
INTRAMUSCULAR | Status: DC | PRN
  Administered 2015-04-08 (×2): 25 ug via INTRAVENOUS
  Administered 2015-04-08: 50 ug via INTRAVENOUS

## 2015-04-08 MED ORDER — PROPOFOL 10 MG/ML IV BOLUS
INTRAVENOUS | Status: AC
  Filled 2015-04-08: qty 20

## 2015-04-08 MED ORDER — ONDANSETRON HCL 4 MG/2ML IJ SOLN
INTRAMUSCULAR | Status: DC | PRN
  Administered 2015-04-08: 4 mg via INTRAVENOUS

## 2015-04-08 MED ORDER — CIPROFLOXACIN IN D5W 400 MG/200ML IV SOLN
400.0000 mg | INTRAVENOUS | Status: AC
  Administered 2015-04-08: 400 mg via INTRAVENOUS

## 2015-04-08 MED ORDER — HYDROCODONE-ACETAMINOPHEN 5-325 MG PO TABS: 1.0000 | ORAL_TABLET | Freq: Four times a day (QID) | ORAL | Status: DC | PRN

## 2015-04-08 MED ORDER — FENTANYL CITRATE (PF) 100 MCG/2ML IJ SOLN
25.0000 ug | INTRAMUSCULAR | Status: DC | PRN
  Administered 2015-04-08: 25 ug via INTRAVENOUS

## 2015-04-08 MED ORDER — PROMETHAZINE HCL 25 MG/ML IJ SOLN: 6.2500 mg | INTRAMUSCULAR | Status: DC | PRN

## 2015-04-08 MED ORDER — DOCUSATE SODIUM 100 MG PO CAPS: 100.0000 mg | ORAL_CAPSULE | Freq: Two times a day (BID) | ORAL | Status: DC

## 2015-04-08 MED ORDER — ONDANSETRON HCL 4 MG/2ML IJ SOLN
INTRAMUSCULAR | Status: AC
  Filled 2015-04-08: qty 2

## 2015-04-08 MED ORDER — IOHEXOL 300 MG/ML  SOLN
INTRAMUSCULAR | Status: DC | PRN
  Administered 2015-04-08: 50 mL

## 2015-04-08 MED ORDER — FENTANYL CITRATE (PF) 100 MCG/2ML IJ SOLN
INTRAMUSCULAR | Status: AC
  Filled 2015-04-08: qty 2

## 2015-04-08 MED ORDER — KETOROLAC TROMETHAMINE 30 MG/ML IJ SOLN
INTRAMUSCULAR | Status: AC
  Filled 2015-04-08: qty 1

## 2015-04-08 MED ORDER — LACTATED RINGERS IV SOLN
INTRAVENOUS | Status: DC | PRN
  Administered 2015-04-08: 11:00:00 via INTRAVENOUS

## 2015-04-08 MED ORDER — CIPROFLOXACIN IN D5W 400 MG/200ML IV SOLN
INTRAVENOUS | Status: AC
  Filled 2015-04-08: qty 200

## 2015-04-08 SURGICAL SUPPLY — 15 items
BAG URO CATCHER STRL LF (DRAPE) ×3 IMPLANT
CATH INTERMIT  6FR 70CM (CATHETERS) ×3 IMPLANT
CLOTH BEACON ORANGE TIMEOUT ST (SAFETY) ×3 IMPLANT
GLOVE BIOGEL M STRL SZ7.5 (GLOVE) ×5 IMPLANT
GOWN STRL REUS W/TWL LRG LVL3 (GOWN DISPOSABLE) ×8 IMPLANT
GUIDEWIRE ANG ZIPWIRE 038X150 (WIRE) IMPLANT
GUIDEWIRE STR DUAL SENSOR (WIRE) ×5 IMPLANT
KIT BALLIN UROMAX 15FX10 (LABEL) IMPLANT
MANIFOLD NEPTUNE II (INSTRUMENTS) ×3 IMPLANT
PACK CYSTO (CUSTOM PROCEDURE TRAY) ×3 IMPLANT
SET HIGH PRES BAL DIL (LABEL) ×2
SHEATH ACCESS URETERAL 38CM (SHEATH) IMPLANT
STENT CONTOUR 6FRX26X.038 (STENTS) ×2 IMPLANT
TUBING CONNECTING 10 (TUBING) ×2 IMPLANT
TUBING CONNECTING 10' (TUBING) ×1

## 2015-04-08 NOTE — H&P (Addendum)
11:20 AM   [name] [name] [birth year] [id number]  Referring provider: No referring provider defined for this encounter.  CC: Hematuria  HPI: 69 y.o. With left renal mass and lung nodules presents for bx of left renal mass via URS   PMH: Past Medical History  Diagnosis Date  . COPD (chronic obstructive pulmonary disease) ([place])     Quit smoking 10/11  . ED (erectile dysfunction) of organic origin   . Hypertension   . GERD (gastroesophageal reflux disease)   . CAD (coronary artery disease)     Exertional chest pain prompted LHC 10/11 showing EF 55%, mild inferior hypokinesis, 90% prox RCA, 70% mid RCA, 80% distal RCA, 70% ostial PDA, 70% mPLV, 90% mid OM1 (large), 90-95% prox LAD. Pt had CABG with LIMA-LAD, SVG-OMG1, seq SVG-PDA/PLV  . Atrial fibrillation ([place])     Breif, post-op CABG    Surgical History: Past Surgical History  Procedure Laterality Date  . Coronary artery bypass graft      LIMA-LAD, SVG-OM1, seq SVG-PDA/PLV    Home Medications:    Medication List    ASK your doctor about these medications        acetaminophen 325 MG tablet  Commonly known as:  TYLENOL  Take 650 mg by mouth every 6 (six) hours as needed.     aspirin EC 81 MG tablet  Take 1 tablet (81 mg total) by mouth daily.     atorvastatin 40 MG tablet  Commonly known as:  LIPITOR  Take 1 tablet (40 mg total) by mouth daily.     lisinopril 5 MG tablet  Commonly known as:  PRINIVIL,ZESTRIL  Take 1 tablet (5 mg total) by mouth daily.     metoprolol tartrate 25 MG tablet  Commonly known as:  LOPRESSOR  Take 0.5 tablets (12.5 mg total) by mouth 2 (two) times daily.     nitroGLYCERIN 0.4 MG SL tablet  Commonly known as:  NITROSTAT  Place 1 tablet (0.4 mg total) under the tongue every 5 (five) minutes as needed. May repeat up to 3 doses.     polycarbophil 625 MG tablet  Commonly known as:  FIBERCON  Take 625 mg by mouth as directed.     ranitidine 150 MG tablet  Commonly known as:   ZANTAC  Take 150 mg by mouth 2 (two) times daily.     sildenafil 50 MG tablet  Commonly known as:  VIAGRA  Take 50 mg by mouth as needed for erectile dysfunction.     Vitamin D 1000 UNITS capsule  Take 1,000 Units by mouth daily.        Allergies: No Known Allergies  Family History: Family History  Problem Relation Age of Onset  . Arthritis Mother   . Hypertension Mother   . Arthritis Father   . Hypertension Father   . Lung cancer Father   . Heart attack Neg Hx   . Stroke Maternal Uncle   . Sudden death Mother 82    Social History:  reports that he quit smoking about 5 years ago. He has quit using smokeless tobacco. He reports that he does not drink alcohol or use illicit drugs.  ROS:                                        Physical Exam: Ht 5\' 9"  (1.753 m)  Wt 175 lb (79.379 kg)  BMI  25.83 kg/m2  Constitutional:  Alert and oriented, No acute distress. HEENT: [place] AT, moist mucus membranes.  Trachea midline, no masses. Cardiovascular: No clubbing, cyanosis, or edema. Respiratory: Normal respiratory effort, no increased work of breathing. GI: Abdomen is soft, nontender, nondistended, no abdominal masses GU: No CVA tenderness.  Skin: No rashes, bruises or suspicious lesions. Lymph: No cervical or inguinal adenopathy. Neurologic: Grossly intact, no focal deficits, moving all 4 extremities. Psychiatric: Normal mood and affect.  Laboratory Data: Lab Results  Component Value Date   WBC 7.5 04/02/2015   HGB 13.7 04/02/2015   HCT 41.7 04/02/2015   MCV 97.0 04/02/2015   PLT 218 04/02/2015    Lab Results  Component Value Date   CREATININE 0.90 04/02/2015    Lab Results  Component Value Date   PSA 0.49 09/09/2011    No results found for: TESTOSTERONE  No results found for: HGBA1C  Urinalysis    Component Value Date/Time   COLORURINE YELLOW 04/23/2010 0626   APPEARANCEUR CLEAR 04/23/2010 0626   LABSPEC 1.017 04/23/2010 0626    PHURINE 7.0 04/23/2010 0626   GLUCOSEU NEGATIVE 04/23/2010 0626   HGBUR SMALL* 04/23/2010 0626   BILIRUBINUR neg 09/09/2011 0949   BILIRUBINUR NEGATIVE 04/23/2010 0626   KETONESUR NEGATIVE 04/23/2010 0626   PROTEINUR neg 09/09/2011 0949   PROTEINUR NEGATIVE 04/23/2010 0626   UROBILINOGEN 0.2 09/09/2011 0949   UROBILINOGEN 0.2 04/23/2010 0626   NITRITE neg 09/09/2011 0949   NITRITE NEGATIVE 04/23/2010 0626   LEUKOCYTESUR Negative 09/09/2011 0949      Assessment & Plan:   Hematuria/Left renal mass/lung nodules -cysto, left URS, renal mass bx  Nickie Retort, MD

## 2015-04-08 NOTE — Discharge Instructions (Signed)
PATIENT INSTRUCTIONS POST-ANESTHESIA  IMMEDIATELY FOLLOWING SURGERY:  Do not drive or operate machinery for the first twenty four hours after surgery.  Do not make any important decisions for twenty four hours after surgery or while taking narcotic pain medications or sedatives.  If you develop intractable nausea and vomiting or a severe headache please notify your doctor immediately.    Cystoscopy, Care After Refer to this sheet in the next few weeks. These instructions provide you with information on caring for yourself after your procedure. Your caregiver may also give you more specific instructions. Your treatment has been planned according to current medical practices, but problems sometimes occur. Call your caregiver if you have any problems or questions after your procedure. HOME CARE INSTRUCTIONS  Things you can do to ease any discomfort after your procedure include:  Drinking enough water and fluids to keep your urine clear or pale yellow.  Taking a warm bath to relieve any burning feelings. SEEK IMMEDIATE MEDICAL CARE IF:   You have an increase in blood in your urine.  You notice blood clots in your urine.  You have difficulty passing urine.  You have the chills.  You have abdominal pain.  You have a fever or persistent symptoms for more than 2-3 days.  You have a fever and your symptoms suddenly get worse. MAKE SURE YOU:   Understand these instructions.  Will watch your condition.  Will get help right away if you are not doing well or get worse.   This information is not intended to replace advice given to you by your health care provider. Make sure you discuss any questions you have with your health care provider.   Document Released: 01/01/2005 Document Revised: 07/05/2014 Document Reviewed: 12/06/2011 Elsevier Interactive Patient Education [employer].

## 2015-04-08 NOTE — Anesthesia Preprocedure Evaluation (Signed)
Anesthesia Evaluation  Patient identified by MRN, date of birth, ID band Patient awake    Reviewed: Allergy & Precautions, NPO status , Patient's Chart, lab work & pertinent test results  Airway Mallampati: II  TM Distance: >3 FB Neck ROM: Full    Dental no notable dental hx.    Pulmonary COPD, former smoker,    Pulmonary exam normal breath sounds clear to auscultation       Cardiovascular hypertension, Pt. on medications and Pt. on home beta blockers + CAD and + CABG  Normal cardiovascular exam Rhythm:Regular Rate:Normal     Neuro/Psych negative neurological ROS  negative psych ROS   GI/Hepatic negative GI ROS, Neg liver ROS,   Endo/Other  negative endocrine ROS  Renal/GU negative Renal ROS  negative genitourinary   Musculoskeletal negative musculoskeletal ROS (+)   Abdominal   Peds negative pediatric ROS (+)  Hematology negative hematology ROS (+)   Anesthesia Other Findings   Reproductive/Obstetrics negative OB ROS                             Anesthesia Physical Anesthesia Plan  ASA: III  Anesthesia Plan: General   Post-op Pain Management:    Induction: Intravenous  Airway Management Planned: LMA  Additional Equipment:   Intra-op Plan:   Post-operative Plan: Extubation in OR  Informed Consent: I have reviewed the patients History and Physical, chart, labs and discussed the procedure including the risks, benefits and alternatives for the proposed anesthesia with the patient or authorized representative who has indicated his/her understanding and acceptance.   Dental advisory given  Plan Discussed with: CRNA and Surgeon  Anesthesia Plan Comments:         Anesthesia Quick Evaluation

## 2015-04-08 NOTE — Transfer of Care (Signed)
Immediate Anesthesia Transfer of Care Note  Patient: [name] [name]  Procedure(s) Performed: Procedure(s): CYSTOSCOPY WITH LEFT  RETROGRADE PYELOGRAM, BALLOON DILATION LEFT URETER, LEFT URETEROSCOPY,LEFT STENT PLACEMENT (Left)  Patient Location: PACU  Anesthesia Type:General  Level of Consciousness: awake, alert , oriented and patient cooperative  Airway & Oxygen Therapy: Patient Spontanous Breathing and Patient connected to face mask oxygen  Post-op Assessment: Report given to RN and Post -op Vital signs reviewed and stable  Post vital signs: Reviewed and stable  Last Vitals: There were no vitals filed for this visit.  Complications: No apparent anesthesia complications

## 2015-04-08 NOTE — Progress Notes (Signed)
Patient in short stay post op. Here from PACU at 1340. Ambulated to restroom with RN. Unable to void. Does not feel urge at this time. Sitting in recliner eating crackers and drinking. IVF running. Will continue to monitor.

## 2015-04-08 NOTE — Op Note (Signed)
Preoperative Diagnosis: Left renal mass,lung lesions concerning for metastasis  Postoperative diagnosis: Same  Procedure: Cystoscopy,left retrograde pyelogram with interpretation, left ureteroscopy,left ureteral stent placement 6 French by 26 cm, attempted left renal pelvis mass biopsy  Surgeon: Dr. Baruch Gouty   Drains: 6 French by 26 cm double-J left ureteral stent  Findings:Due to the patient's anatomy was impossible to reach the patient's left renal pelvis with the semirigid or flexible ureteroscope  Radiological findings:A retrograde did show a displaced left renal pelvis from the known tumor contrast at the end of   A left ureteral stent was placedand properly positioned.   Dispo: stable to the post anesthesia care unit  Indications for procedure: He is a 69 year old male who originally presgross hematuria.  He was found to have a left renal massabutting his left collecting collecting system concerning for transitional cell carcinoma.  He also has masses in his lungs concerning for metastasis.  Description of procedure: The patient was met in the preoperative .  All risks, benefits, indications of the procedure were described in great detail. He consents to procedure.  He was given preoperative antibiotics. He was taken back to the operative theater. General anesthesia was induced per the anesthesia service.he was placed in the dorsal lithotomy position and prepped and draped in a sterile fashion.  A timeout was called.a 21 French 30 cystoscope was inserted into the patient's bladder per urethra atraumatically.  Left ureteral orifice was then intubated with a sensor wire and a sensor wire was placed up to level of thel pelvis under fluoroscopy.  A flexible ureteroscope was then assembled and placed over the sensor wire.  It was unable to be passed into the left ureteral orifice.  At this time decision was made to balloon dilate the left ureteral orifice over the sensor wire under  fluoroscopy.. Another attempt at placing the flexor ureteroscope was again unsuccessful.  A semirigid 4-1/2 [place] ureteroscope was then navigated with some difficulty into the left ureter. A retrograde pyelogram was obtained. This showed the sensor wire curled in the patient's left upper pole.  In the proximal left ureter, it became very difficult to further advance the ureteroscopy.  Further attempts at advancing the ureteroscope caused some trauma to the ureter.  This was confirmed with extravasation of contrast on repeat retrograde pyelogram.  At this point, it was decided to abort attempts to biopsy this lesion as to not cause more harm to the ureter.  The ureteroscope was withdrawn.  The cystoscope was inserted over the sensorwire which was still coiled in the same place in the left upper pole as seen on previous retrograde pyelogram.  A  6 French by 26 cm double-J ureteral stent was placed over the sensor wire.  The sensor wire was removed. Correct placement was confirmed with a curl seen in the patient's left upper pole on fluoroscopy and a curl seen in the patient's urinary bladder with direct visualization. There was clear urine effluxing from the stent.  The patient's bladder was emptied.  The cystoscope withdrawn.  The patient was awoken from anesthesia. He was transferred in stable condition to the post anesthesia care unit.  Plan: We will arrange for the patient to undergo a lung biopsy with interventional radiology as soon as possible.  He will follow up for those results. Depending on results, we will either repeat ureteroscopy or remove the stent in the office.

## 2015-04-08 NOTE — Anesthesia Procedure Notes (Addendum)
Procedure Name: LMA Insertion Date/Time: 04/08/2015 12:56 PM Performed by: Dione Booze Pre-anesthesia Checklist: Patient identified, Emergency Drugs available, Suction available and Patient being monitored Patient Re-evaluated:Patient Re-evaluated prior to inductionOxygen Delivery Method: Circle system utilized Preoxygenation: Pre-oxygenation with 100% oxygen Intubation Type: IV induction LMA: LMA inserted LMA Size: 5.0 Number of attempts: 2 Placement Confirmation: positive ETCO2 Tube secured with: Tape Dental Injury: Teeth and Oropharynx as per pre-operative assessment    Performed by: Dione Booze

## 2015-04-08 NOTE — Progress Notes (Signed)
Patient up to restroom and voided. Patient and wife verbalize understanding of d/c instructions. Wife has filled pain med script at outpatient pharmacy. Home with wife at 34.

## 2015-04-08 NOTE — Anesthesia Postprocedure Evaluation (Signed)
  Anesthesia Post-op Note  Patient: [name] [name]  Procedure(s) Performed: Procedure(s) (LRB): CYSTOSCOPY WITH LEFT  RETROGRADE PYELOGRAM, BALLOON DILATION LEFT URETER, LEFT URETEROSCOPY,LEFT STENT PLACEMENT (Left)  Patient Location: PACU  Anesthesia Type: General  Level of Consciousness: awake and alert   Airway and Oxygen Therapy: Patient Spontanous Breathing  Post-op Pain: mild  Post-op Assessment: Post-op Vital signs reviewed, Patient's Cardiovascular Status Stable, Respiratory Function Stable, Patent Airway and No signs of Nausea or vomiting  Last Vitals:  Filed Vitals:   04/08/15 1300  BP: 137/70  Pulse: 70  Temp: 36.3 C  Resp: 15    Post-op Vital Signs: stable   Complications: No apparent anesthesia complications

## 2015-04-08 NOTE — Progress Notes (Signed)
Does not feel the need to void. Ambulated to restroom with RN. No urge to void. Bladder scanned for 45cc. Drinking fluids and ivf running. Continue to monitor.

## 2015-04-09 ENCOUNTER — Other Ambulatory Visit: Payer: Self-pay | Admitting: Urology

## 2015-04-09 DIAGNOSIS — R918 Other nonspecific abnormal finding of lung field: Secondary | ICD-10-CM

## 2015-04-10 ENCOUNTER — Other Ambulatory Visit: Payer: Self-pay | Admitting: Radiology

## 2015-04-11 ENCOUNTER — Other Ambulatory Visit: Payer: Self-pay | Admitting: Radiology

## 2015-04-14 ENCOUNTER — Other Ambulatory Visit: Payer: Self-pay | Admitting: Radiology

## 2015-04-15 ENCOUNTER — Ambulatory Visit (HOSPITAL_COMMUNITY)
Admission: RE | Admit: 2015-04-15 | Discharge: 2015-04-15 | Disposition: A | Discharge status: Home / Self Care | Payer: Medicare Other | Source: Ambulatory Visit | Attending: Urology | Admitting: Urology

## 2015-04-15 ENCOUNTER — Other Ambulatory Visit: Payer: Self-pay | Admitting: Urology

## 2015-04-15 ENCOUNTER — Encounter (HOSPITAL_COMMUNITY): Payer: Self-pay

## 2015-04-15 DIAGNOSIS — Z79899 Other long term (current) drug therapy: Secondary | ICD-10-CM | POA: Insufficient documentation

## 2015-04-15 DIAGNOSIS — Z951 Presence of aortocoronary bypass graft: Secondary | ICD-10-CM | POA: Diagnosis not present

## 2015-04-15 DIAGNOSIS — K219 Gastro-esophageal reflux disease without esophagitis: Secondary | ICD-10-CM | POA: Diagnosis not present

## 2015-04-15 DIAGNOSIS — J449 Chronic obstructive pulmonary disease, unspecified: Secondary | ICD-10-CM | POA: Diagnosis not present

## 2015-04-15 DIAGNOSIS — R319 Hematuria, unspecified: Secondary | ICD-10-CM | POA: Insufficient documentation

## 2015-04-15 DIAGNOSIS — N2889 Other specified disorders of kidney and ureter: Secondary | ICD-10-CM | POA: Diagnosis not present

## 2015-04-15 DIAGNOSIS — I2510 Atherosclerotic heart disease of native coronary artery without angina pectoris: Secondary | ICD-10-CM | POA: Diagnosis not present

## 2015-04-15 DIAGNOSIS — R918 Other nonspecific abnormal finding of lung field: Secondary | ICD-10-CM

## 2015-04-15 DIAGNOSIS — Z87891 Personal history of nicotine dependence: Secondary | ICD-10-CM | POA: Diagnosis not present

## 2015-04-15 DIAGNOSIS — I10 Essential (primary) hypertension: Secondary | ICD-10-CM | POA: Diagnosis not present

## 2015-04-15 DIAGNOSIS — C642 Malignant neoplasm of left kidney, except renal pelvis: Secondary | ICD-10-CM | POA: Diagnosis not present

## 2015-04-15 DIAGNOSIS — Z885 Allergy status to narcotic agent status: Secondary | ICD-10-CM | POA: Insufficient documentation

## 2015-04-15 LAB — CBC
HEMATOCRIT: 40.3 % (ref 39.0–52.0)
HEMOGLOBIN: 13.6 g/dL (ref 13.0–17.0)
MCH: 32 pg (ref 26.0–34.0)
MCHC: 33.7 g/dL (ref 30.0–36.0)
MCV: 94.8 fL (ref 78.0–100.0)
Platelets: 251 10*3/uL (ref 150–400)
RBC: 4.25 MIL/uL (ref 4.22–5.81)
RDW: 12.2 % (ref 11.5–15.5)
WBC: 9 10*3/uL (ref 4.0–10.5)

## 2015-04-15 LAB — PROTIME-INR
INR: 1.12 (ref 0.00–1.49)
Prothrombin Time: 14.6 seconds (ref 11.6–15.2)

## 2015-04-15 LAB — APTT: APTT: 23 s — AB (ref 24–37)

## 2015-04-15 MED ORDER — FENTANYL CITRATE (PF) 100 MCG/2ML IJ SOLN
INTRAMUSCULAR | Status: AC
  Filled 2015-04-15: qty 2

## 2015-04-15 MED ORDER — FENTANYL CITRATE (PF) 100 MCG/2ML IJ SOLN
INTRAMUSCULAR | Status: AC | PRN
  Administered 2015-04-15 (×2): 25 ug via INTRAVENOUS
  Administered 2015-04-15: 50 ug via INTRAVENOUS

## 2015-04-15 MED ORDER — MIDAZOLAM HCL 2 MG/2ML IJ SOLN
INTRAMUSCULAR | Status: AC | PRN
  Administered 2015-04-15 (×2): 0.5 mg via INTRAVENOUS
  Administered 2015-04-15: 1 mg via INTRAVENOUS

## 2015-04-15 MED ORDER — LIDOCAINE HCL (PF) 1 % IJ SOLN
INTRAMUSCULAR | Status: AC
  Filled 2015-04-15: qty 10

## 2015-04-15 MED ORDER — MIDAZOLAM HCL 2 MG/2ML IJ SOLN
INTRAMUSCULAR | Status: AC
  Filled 2015-04-15: qty 2

## 2015-04-15 MED ORDER — SODIUM CHLORIDE 0.9 % IV SOLN: Freq: Once | INTRAVENOUS | Status: DC

## 2015-04-15 MED ORDER — GELATIN ABSORBABLE 12-7 MM EX MISC
CUTANEOUS | Status: AC
  Filled 2015-04-15: qty 1

## 2015-04-15 MED ORDER — LIDOCAINE HCL 1 % IJ SOLN
INTRAMUSCULAR | Status: AC
  Filled 2015-04-15: qty 20

## 2015-04-15 MED ORDER — SODIUM CHLORIDE 0.9 % IV SOLN
INTRAVENOUS | Status: AC | PRN
  Administered 2015-04-15: 10 mL/h via INTRAVENOUS

## 2015-04-15 NOTE — Procedures (Signed)
Interventional Radiology Procedure Note  Procedure:  Ultrasound guided core biopsy of left kidney  Complications:  None  Estimated Blood Loss: < 25 mL  18 G core biopsy x 3 in region of infiltrative mass of left kidney.  Tract embolized with Gelfoam pledgets.  Venetia Night. Kathlene Cote, M.D Pager:  [phone]

## 2015-04-15 NOTE — Progress Notes (Signed)
Pt voided in urinal, urine less hematuric than previous. Jannifer Franklin, PA notified. She wants pt to void once more prior to discharge and inform her of urine characterisitic.

## 2015-04-15 NOTE — Sedation Documentation (Signed)
Patient is resting comfortably. 

## 2015-04-15 NOTE — Progress Notes (Signed)
Pt voided in urinal with hematuria noted. Jannifer Franklin, PA notified and is at bedside to assess pt.

## 2015-04-15 NOTE — H&P (Signed)
Chief Complaint: Patient was seen in consultation today for Left renal mass; presumed transitional cell cancer at the request of Nickie Retort  Referring Physician(s): Nickie Retort  History of Present Illness: [name] [name] is a 69 y.o. male    Hematuria Left renal mass--presumed transitional cell carcinoma B new lung nodules Dr [name] performed cystoscopy with planned attempt to bx renal mass 10./04/2015 Unable to reach mass from below during cystoscopy Was originally scheduled today for LLL lung mass bx to evaluate metastasis After further discussion between Dr Kathlene Cote and Dr [name]; Decision is to biopsy Left renal mass percutaneously   Past Medical History  Diagnosis Date  . COPD (chronic obstructive pulmonary disease) ([place])     Quit smoking 10/11  . ED (erectile dysfunction) of organic origin   . Hypertension   . GERD (gastroesophageal reflux disease)   . CAD (coronary artery disease)     Exertional chest pain prompted LHC 10/11 showing EF 55%, mild inferior hypokinesis, 90% prox RCA, 70% mid RCA, 80% distal RCA, 70% ostial PDA, 70% mPLV, 90% mid OM1 (large), 90-95% prox LAD. Pt had CABG with LIMA-LAD, SVG-OMG1, seq SVG-PDA/PLV  . Atrial fibrillation ([place])     Breif, post-op CABG    Past Surgical History  Procedure Laterality Date  . Coronary artery bypass graft      LIMA-LAD, SVG-OM1, seq SVG-PDA/PLV  . Cystoscopy with retrograde pyelogram, ureteroscopy and stent placement Left 04/08/2015    Procedure: CYSTOSCOPY WITH LEFT  RETROGRADE PYELOGRAM, BALLOON DILATION LEFT URETER, LEFT URETEROSCOPY,LEFT STENT PLACEMENT;  Surgeon: Nickie Retort, MD;  Location: [facility];  Service: Urology;  Laterality: Left;    Allergies: Hydrocodone  Medications: Prior to Admission medications   Medication Sig Start Date End Date Taking? Authorizing Provider  acetaminophen (TYLENOL) 325 MG tablet Take 650 mg by mouth every 6 (six) hours as needed for moderate pain  or headache.    Yes Historical Provider, MD  atorvastatin (LIPITOR) 40 MG tablet Take 1 tablet (40 mg total) by mouth daily. 03/07/15  Yes Larey Dresser, MD  Cholecalciferol (VITAMIN D) 1000 UNITS capsule Take 1,000 Units by mouth daily.     Yes Historical Provider, MD  docusate sodium (COLACE) 100 MG capsule Take 1 capsule (100 mg total) by mouth 2 (two) times daily. Patient taking differently: Take 100 mg by mouth 2 (two) times daily as needed for mild constipation.  04/08/15  Yes Nickie Retort, MD  HYDROcodone-acetaminophen (NORCO) 5-325 MG tablet Take 1 tablet by mouth every 6 (six) hours as needed for moderate pain. 04/08/15  Yes Nickie Retort, MD  lisinopril (PRINIVIL,ZESTRIL) 5 MG tablet Take 1 tablet (5 mg total) by mouth daily. Patient taking differently: Take 5 mg by mouth every evening.  03/07/15  Yes Larey Dresser, MD  metoprolol tartrate (LOPRESSOR) 25 MG tablet Take 0.5 tablets (12.5 mg total) by mouth 2 (two) times daily. 03/07/15  Yes Larey Dresser, MD  nitroGLYCERIN (NITROSTAT) 0.4 MG SL tablet Place 1 tablet (0.4 mg total) under the tongue every 5 (five) minutes as needed. May repeat up to 3 doses. 03/07/15  Yes Larey Dresser, MD  polycarbophil (FIBERCON) 625 MG tablet Take 625 mg by mouth daily.    Yes Historical Provider, MD  ranitidine (ZANTAC) 150 MG tablet Take 150 mg by mouth 2 (two) times daily.   Yes Historical Provider, MD  sildenafil (VIAGRA) 50 MG tablet Take 50 mg by mouth as needed for erectile dysfunction.  Yes Historical Provider, MD  ciprofloxacin (CIPRO) 500 MG tablet Take 1 tablet (500 mg total) by mouth 2 (two) times daily. Patient not taking: Reported on 04/10/2015 04/08/15   Nickie Retort, MD     Family History  Problem Relation Age of Onset  . Arthritis Mother   . Hypertension Mother   . Arthritis Father   . Hypertension Father   . Lung cancer Father   . Heart attack Neg Hx   . Stroke Maternal Uncle   . Sudden death Mother 11     Social History   Social History  . Marital Status: Married    Spouse Name: N/A  . Number of Children: N/A  . Years of Education: N/A   Occupational History  . 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 Smoking status: Former Smoker    Quit date: 07/07/2009  . Smokeless tobacco: Former Systems developer  . Alcohol Use: No  . Drug Use: No  . Sexual Activity: Not Asked   Other Topics Concern  . None   Social History Narrative   Married   3 healthy children   Regular exercise   Caffeinated beverages   Dentures      Designated Party Form signed on 01/06/2010 appointing Ward Chatters. May leave message on cell phone.     Review of Systems: A 12 point ROS discussed and pertinent positives are indicated in the HPI above.  All other systems are negative.  Review of Systems  Constitutional: Negative for fever, activity change and appetite change.  Respiratory: Negative for cough and shortness of breath.   Cardiovascular: Negative for chest pain.  Gastrointestinal: Negative for nausea.  Genitourinary: Positive for hematuria. Negative for dysuria and difficulty urinating.  Musculoskeletal: Negative for back pain.  Neurological: Negative for weakness.  Psychiatric/Behavioral: Negative for behavioral problems and confusion.    Vital Signs: BP 116/71 mmHg  Pulse 73  Temp(Src) 97.8 F (36.6 C)  Resp 20  Ht 5\' 9"  (1.753 m)  Wt 170 lb (77.111 kg)  BMI 25.09 kg/m2  SpO2 98%  Physical Exam  Constitutional: He is oriented to person, place, and time. He appears well-nourished.  Cardiovascular: Normal rate, regular rhythm and normal heart sounds.   Pulmonary/Chest: Effort normal and breath sounds normal. He has no wheezes.  Abdominal: Soft. Bowel sounds are normal.  Musculoskeletal: Normal range of motion.  Neurological: He is alert and oriented to person, place, and time.  Skin: Skin is warm and dry.  Psychiatric: He has a normal mood and affect. His  behavior is normal. Judgment and thought content normal.  Nursing note and vitals reviewed.   Mallampati Score:  MD Evaluation Airway: WNL Heart: WNL Abdomen: WNL Chest/ Lungs: WNL ASA  Classification: 3 Mallampati/Airway Score: One  Imaging: No results found.  Labs:  CBC:  Recent Labs  04/02/15 1125 04/15/15 0826  WBC 7.5 9.0  HGB 13.7 13.6  HCT 41.7 40.3  PLT 218 251    COAGS:  Recent Labs  04/15/15 0826  INR 1.12  APTT 23*    BMP:  Recent Labs  06/13/14 0916 03/05/15 0831 04/02/15 1125  NA 137 137 136  K 3.9 3.9 4.1  CL 105 103 104  CO2 24 27 25   GLUCOSE 106* 109* 105*  BUN 19 18 18   CALCIUM 9.6 9.4 9.4  CREATININE 0.9 0.96 0.90  GFRNONAA  --   --  >60  GFRAA  --   --  >  60    LIVER FUNCTION TESTS:  Recent Labs  06/13/14 0916 03/05/15 0831  BILITOT 1.4* 0.8  AST 32 19  ALT 29 17  ALKPHOS 59 82  PROT 6.8 6.9  ALBUMIN 4.0 3.8    TUMOR MARKERS: No results for input(s): AFPTM, CEA, CA199, CHROMGRNA in the last 8760 hours.  Assessment and Plan:  Left renal mass-- presumed transitional cell carcinoma Hematuria Lung nodules Now scheduled for L renal mass bx Risks and Benefits discussed with the patient including, but not limited to bleeding, infection, damage to adjacent structures or low yield requiring additional tests. All of the patient's questions were answered, patient is agreeable to proceed. Consent signed and in chart.   Thank you for this interesting consult.  I greatly enjoyed meeting [name] [name] and look forward to participating in their care.  A copy of this report was sent to the requesting provider on this date.  Signed: Kashawna Manzer A 04/15/2015, 9:08 AM   I spent a total of  30 Minutes   in face to face in clinical consultation, greater than 50% of which was counseling/coordinating care for L renal mass bx

## 2015-04-15 NOTE — Progress Notes (Signed)
Pt voided, urine lighter hematuria than previous. Jannifer Franklin, PA informed who states pt can be discharged. Instructed pt if hematuria worsens or he cannot void to call office/go to ED. Pt verbalized understanding.

## 2015-04-15 NOTE — Progress Notes (Signed)
Pt's IVF NS restarted at 50cc/hr and pt needs to void again prior to discharge per Pam Turpin,PA.

## 2015-04-15 NOTE — Progress Notes (Signed)
Referring Physician(s): Nickie Retort  Chief Complaint:  Hematuria L renal mass B lung nodules  Subjective:  L renal mass bx performed in [place] today No complication Tolerated well Post procedure pt doing well in [facility] Eating well; resting well Noted hematuria when urinated into urinal Hematuria seems worse than what is usual for pt Denies pain Denies urgency or inability to urinate Did feel like he may have seen some very small clot in the blood in the urine; he has noted this before with hematuria at home   Allergies: Hydrocodone  Medications: Prior to Admission medications   Medication Sig Start Date End Date Taking? Authorizing Provider  acetaminophen (TYLENOL) 325 MG tablet Take 650 mg by mouth every 6 (six) hours as needed for moderate pain or headache.     Historical Provider, MD  atorvastatin (LIPITOR) 40 MG tablet Take 1 tablet (40 mg total) by mouth daily. 03/07/15   Larey Dresser, MD  Cholecalciferol (VITAMIN D) 1000 UNITS capsule Take 1,000 Units by mouth daily.      Historical Provider, MD  ciprofloxacin (CIPRO) 500 MG tablet Take 1 tablet (500 mg total) by mouth 2 (two) times daily. Patient not taking: Reported on 04/10/2015 04/08/15   Nickie Retort, MD  docusate sodium (COLACE) 100 MG capsule Take 1 capsule (100 mg total) by mouth 2 (two) times daily. Patient taking differently: Take 100 mg by mouth 2 (two) times daily as needed for mild constipation.  04/08/15   Nickie Retort, MD  HYDROcodone-acetaminophen (NORCO) 5-325 MG tablet Take 1 tablet by mouth every 6 (six) hours as needed for moderate pain. 04/08/15   Nickie Retort, MD  lisinopril (PRINIVIL,ZESTRIL) 5 MG tablet Take 1 tablet (5 mg total) by mouth daily. Patient taking differently: Take 5 mg by mouth every evening.  03/07/15   Larey Dresser, MD  metoprolol tartrate (LOPRESSOR) 25 MG tablet Take 0.5 tablets (12.5 mg total) by mouth 2 (two) times daily. 03/07/15   Larey Dresser, MD    nitroGLYCERIN (NITROSTAT) 0.4 MG SL tablet Place 1 tablet (0.4 mg total) under the tongue every 5 (five) minutes as needed. May repeat up to 3 doses. 03/07/15   Larey Dresser, MD  polycarbophil (FIBERCON) 625 MG tablet Take 625 mg by mouth daily.     Historical Provider, MD  ranitidine (ZANTAC) 150 MG tablet Take 150 mg by mouth 2 (two) times daily.    Historical Provider, MD  sildenafil (VIAGRA) 50 MG tablet Take 50 mg by mouth as needed for erectile dysfunction.     Historical Provider, MD     Vital Signs: BP 110/76 mmHg  Pulse 75  Temp(Src) 98 F (36.7 C) (Oral)  Resp 16  SpO2 99%  Physical Exam   Physical Exam  Constitutional: He is oriented to person, place, and time.  Urine definitely is bloody No definite clotting noted approx 100 cc in urinal Abdominal: Soft. Bowel sounds are normal. There is no tenderness.  Neurological: He is alert and oriented to person, place, and time.  Nursing note and vitals reviewed.  Imaging: US Biopsy  04/15/2015  CLINICAL DATA:  Infiltrated mass of the upper to mid left kidney with multiple bilateral small pulmonary nodules. The left renal mass could not be biopsy from a cystoscopic approach. After considering biopsy of one of the lung nodules under CT guidance, decision was made to perform renal biopsy to try to establish a tissue diagnosis due to the higher risk of performing  a lung biopsy. EXAM: ULTRASOUND GUIDED CORE BIOPSY OF LEFT KIDNEY MEDICATIONS: 2.0 mg IV Versed; 100 mcg IV Fentanyl Total Moderate Sedation Time: 17 minutes. PROCEDURE: The procedure, risks, benefits, and alternatives were explained to the patient. Questions regarding the procedure were encouraged and answered. The patient understands and consents to the procedure. A time-out was performed prior to the procedure. The left flank region was prepped with Betadine in a sterile fashion, and a sterile drape was applied covering the operative field. A sterile gown and sterile gloves  were used for the procedure. Local anesthesia was provided with 1% Lidocaine. Ultrasound was performed of the left kidney. Under ultrasound guidance, a 17 gauge needle was advanced into the kidney. Coaxial core biopsy samples were obtained with an 18 gauge device. Three samples were obtained and submitted in formalin. The tract was embolized with Gel-Foam pledgets as the outer needle was retracted. COMPLICATIONS: None. FINDINGS: Region of heterogeneous tissue in the upper to mid left kidney was targeted. Solid tissue was obtained. There were no immediate bleeding complications. IMPRESSION: Ultrasound-guided core biopsy performed of a left renal mass. Electronically Signed   By: Aletta Edouard M.D.   On: 04/15/2015 12:25    Labs:  CBC:  Recent Labs  04/02/15 1125 04/15/15 0826  WBC 7.5 9.0  HGB 13.7 13.6  HCT 41.7 40.3  PLT 218 251    COAGS:  Recent Labs  04/15/15 0826  INR 1.12  APTT 23*    BMP:  Recent Labs  06/13/14 0916 03/05/15 0831 04/02/15 1125  NA 137 137 136  K 3.9 3.9 4.1  CL 105 103 104  CO2 24 27 25   GLUCOSE 106* 109* 105*  BUN 19 18 18   CALCIUM 9.6 9.4 9.4  CREATININE 0.9 0.96 0.90  GFRNONAA  --   --  >60  GFRAA  --   --  >60    LIVER FUNCTION TESTS:  Recent Labs  06/13/14 0916 03/05/15 0831  BILITOT 1.4* 0.8  AST 32 19  ALT 29 17  ALKPHOS 59 82  PROT 6.8 6.9  ALBUMIN 4.0 3.8    Assessment and Plan:  Hematuria post L renal bx Although hematuria not new to pt---does seem "worse than normal" Discussed with Dr Kathlene Cote Will recheck urine at least once more IV fluids to 50 cc/hr Report to Dr Kathlene Cote  Signed: Monia Sabal A 04/15/2015, 1:36 PM   I spent a total of 15 Minutes at the the patient's bedside AND on the patient's hospital floor or unit, greater than 50% of which was counseling/coordinating care for lt renal bx---hematuria

## 2015-04-15 NOTE — Progress Notes (Signed)
Patient ID: [name] [name], male   DOB: 1946/03/05, 69 y.o.   MRN: [id number]   New urination at 240 pm even lighter still No pain; no clots  Have discussed with Dr Kathlene Cote Now for dc Pt has good understanding to return to ED if sxs recur/worsen Develop pain/clots

## 2015-04-15 NOTE — Discharge Instructions (Signed)
Kidney Biopsy, Care After °Refer to this sheet in the next few weeks. These instructions provide you with information on caring for yourself after your procedure. Your health care provider may also give you more specific instructions. Your treatment has been planned according to current medical practices, but problems sometimes occur. Call your health care provider if you have any problems or questions after your procedure.  °WHAT TO EXPECT AFTER THE PROCEDURE  °· You may notice blood in the urine for the first 24 hours after the biopsy. °· You may feel some pain at the biopsy site for 1-2 weeks after the biopsy. °HOME CARE INSTRUCTIONS °· Do not lift anything heavier than 10 lb (4.5 kg) for 2 weeks. °· Do not take any non-steroidal anti-inflammatory drugs (NSAIDs) or any blood thinners for a week after the biopsy unless instructed to do so by your health care provider. °· Only take medicines for pain, fever, or discomfort as directed by your health care provider. °SEEK MEDICAL CARE IF: °· You have bloody urine more than 24 hours after the biopsy.   °· You develop a fever.   °· You cannot urinate.   °· You have increasing pain at the biopsy site.   °SEEK IMMEDIATE MEDICAL CARE IF: °You feel faint or dizzy.  °  °This information is not intended to replace advice given to you by your health care provider. Make sure you discuss any questions you have with your health care provider. °  °Document Released: 02/14/2013 Document Reviewed: 02/14/2013 °Elsevier Interactive Patient Education ©2016 [employer] ° °

## 2015-04-15 NOTE — Progress Notes (Signed)
Patient ID: [name] [name], male   DOB: September 29, 1945, 69 y.o.   MRN: [id number]   New urine at 205 pm is much less dark Now blood tinged per RN  I have talked again with pt Still denies pain Still denies blood clots in urine Still denies difficulty to urinate  To clarify: States he has had bloody urine off and on for yrs Most recent was bloody for weeks in Aug and Sept And cleared Urology performed Cystoscopy just last week-- Developed dark hematuria again for few days and gradually resolved  Will recheck urine once more--as long as continues to clear can be dc'd home Pt knows he is to return to ED if hematuria recurs; worsens or develops clots and becomes  difficult to urinate  Will discuss with Dr Kathlene Cote

## 2015-04-15 NOTE — Progress Notes (Signed)
Referring Physician(s): Dr [name]  Chief Complaint:  Hematuria L renal mass B lung nodules  Subjective:  L renal mass bx performed in [place] today No complication Tolerated well Post procedure pt doing well in [facility] Eating well; resting well Noted hematuria when urinated into urinal Hematuria seems worse than what is usual for pt Denies pain Denies urgency or inability to urinate Did feel like he may have seen some very small clot in the blood in the urine; he has noted this before with hematuria at home   Allergies: Hydrocodone  Medications: Prior to Admission medications   Medication Sig Start Date End Date Taking? Authorizing Provider  acetaminophen (TYLENOL) 325 MG tablet Take 650 mg by mouth every 6 (six) hours as needed for moderate pain or headache.    Yes Historical Provider, MD  atorvastatin (LIPITOR) 40 MG tablet Take 1 tablet (40 mg total) by mouth daily. 03/07/15  Yes Larey Dresser, MD  Cholecalciferol (VITAMIN D) 1000 UNITS capsule Take 1,000 Units by mouth daily.     Yes Historical Provider, MD  lisinopril (PRINIVIL,ZESTRIL) 5 MG tablet Take 1 tablet (5 mg total) by mouth daily. Patient taking differently: Take 5 mg by mouth every evening.  03/07/15  Yes Larey Dresser, MD  metoprolol tartrate (LOPRESSOR) 25 MG tablet Take 0.5 tablets (12.5 mg total) by mouth 2 (two) times daily. 03/07/15  Yes Larey Dresser, MD  polycarbophil (FIBERCON) 625 MG tablet Take 625 mg by mouth daily.    Yes Historical Provider, MD  ranitidine (ZANTAC) 150 MG tablet Take 150 mg by mouth 2 (two) times daily.   Yes Historical Provider, MD  ciprofloxacin (CIPRO) 500 MG tablet Take 1 tablet (500 mg total) by mouth 2 (two) times daily. Patient not taking: Reported on 04/10/2015 04/08/15   Nickie Retort, MD  docusate sodium (COLACE) 100 MG capsule Take 1 capsule (100 mg total) by mouth 2 (two) times daily. Patient taking differently: Take 100 mg by mouth 2 (two) times daily as needed for  mild constipation.  04/08/15   Nickie Retort, MD  HYDROcodone-acetaminophen (NORCO) 5-325 MG tablet Take 1 tablet by mouth every 6 (six) hours as needed for moderate pain. 04/08/15   Nickie Retort, MD  nitroGLYCERIN (NITROSTAT) 0.4 MG SL tablet Place 1 tablet (0.4 mg total) under the tongue every 5 (five) minutes as needed. May repeat up to 3 doses. 03/07/15   Larey Dresser, MD  sildenafil (VIAGRA) 50 MG tablet Take 50 mg by mouth as needed for erectile dysfunction.     Historical Provider, MD     Vital Signs: BP 144/86 mmHg  Pulse 86  Temp(Src) 97.6 F (36.4 C) (Oral)  Resp 16  Ht 5\' 9"  (1.753 m)  Wt 175 lb (79.379 kg)  BMI 25.83 kg/m2  SpO2 99%  Physical Exam  Constitutional: He is oriented to person, place, and time.  Urine definitely is bloody No definite clotting noted approx 100 cc in urinal   Abdominal: Soft. Bowel sounds are normal. There is no tenderness.  Neurological: He is alert and oriented to person, place, and time.  Nursing note and vitals reviewed.   Imaging: US Biopsy  04/15/2015  CLINICAL DATA:  Infiltrated mass of the upper to mid left kidney with multiple bilateral small pulmonary nodules. The left renal mass could not be biopsy from a cystoscopic approach. After considering biopsy of one of the lung nodules under CT guidance, decision was made to perform renal biopsy  to try to establish a tissue diagnosis due to the higher risk of performing a lung biopsy. EXAM: ULTRASOUND GUIDED CORE BIOPSY OF LEFT KIDNEY MEDICATIONS: 2.0 mg IV Versed; 100 mcg IV Fentanyl Total Moderate Sedation Time: 17 minutes. PROCEDURE: The procedure, risks, benefits, and alternatives were explained to the patient. Questions regarding the procedure were encouraged and answered. The patient understands and consents to the procedure. A time-out was performed prior to the procedure. The left flank region was prepped with Betadine in a sterile fashion, and a sterile drape was applied  covering the operative field. A sterile gown and sterile gloves were used for the procedure. Local anesthesia was provided with 1% Lidocaine. Ultrasound was performed of the left kidney. Under ultrasound guidance, a 17 gauge needle was advanced into the kidney. Coaxial core biopsy samples were obtained with an 18 gauge device. Three samples were obtained and submitted in formalin. The tract was embolized with Gel-Foam pledgets as the outer needle was retracted. COMPLICATIONS: None. FINDINGS: Region of heterogeneous tissue in the upper to mid left kidney was targeted. Solid tissue was obtained. There were no immediate bleeding complications. IMPRESSION: Ultrasound-guided core biopsy performed of a left renal mass. Electronically Signed   By: Aletta Edouard M.D.   On: 04/15/2015 12:25    Labs:  CBC:  Recent Labs  04/02/15 1125 04/15/15 0826  WBC 7.5 9.0  HGB 13.7 13.6  HCT 41.7 40.3  PLT 218 251    COAGS:  Recent Labs  04/15/15 0826  INR 1.12  APTT 23*    BMP:  Recent Labs  06/13/14 0916 03/05/15 0831 04/02/15 1125  NA 137 137 136  K 3.9 3.9 4.1  CL 105 103 104  CO2 24 27 25   GLUCOSE 106* 109* 105*  BUN 19 18 18   CALCIUM 9.6 9.4 9.4  CREATININE 0.9 0.96 0.90  GFRNONAA  --   --  >60  GFRAA  --   --  >60    LIVER FUNCTION TESTS:  Recent Labs  06/13/14 0916 03/05/15 0831  BILITOT 1.4* 0.8  AST 32 19  ALT 29 17  ALKPHOS 59 82  PROT 6.8 6.9  ALBUMIN 4.0 3.8    Assessment and Plan:  Hematuria post L renal bx Although hematuria not new to pt---does seem "worse than normal" Discussed with Dr Kathlene Cote Will recheck urine at least once more IV fluids to 50 cc/hr Check h/h Report to Dr Kathlene Cote  Signed: Monia Sabal A 04/15/2015, 1:25 PM   I spent a total of 15 Minutes at the the patient's bedside AND on the patient's hospital floor or unit, greater than 50% of which was counseling/coordinating care for renal bx---hematuria

## 2015-04-18 ENCOUNTER — Other Ambulatory Visit: Payer: Self-pay | Admitting: Urology

## 2015-04-18 DIAGNOSIS — C680 Malignant neoplasm of urethra: Secondary | ICD-10-CM

## 2015-05-06 ENCOUNTER — Telehealth: Payer: Self-pay | Admitting: Oncology

## 2015-05-06 ENCOUNTER — Ambulatory Visit (HOSPITAL_COMMUNITY)
Admission: RE | Admit: 2015-05-06 | Discharge: 2015-05-06 | Disposition: A | Discharge status: Home / Self Care | Payer: Medicare Other | Source: Ambulatory Visit | Attending: Urology | Admitting: Urology

## 2015-05-06 DIAGNOSIS — I6523 Occlusion and stenosis of bilateral carotid arteries: Secondary | ICD-10-CM | POA: Insufficient documentation

## 2015-05-06 DIAGNOSIS — K7689 Other specified diseases of liver: Secondary | ICD-10-CM | POA: Insufficient documentation

## 2015-05-06 DIAGNOSIS — C772 Secondary and unspecified malignant neoplasm of intra-abdominal lymph nodes: Secondary | ICD-10-CM | POA: Diagnosis not present

## 2015-05-06 DIAGNOSIS — C779 Secondary and unspecified malignant neoplasm of lymph node, unspecified: Secondary | ICD-10-CM | POA: Diagnosis not present

## 2015-05-06 DIAGNOSIS — Q676 Pectus excavatum: Secondary | ICD-10-CM | POA: Diagnosis not present

## 2015-05-06 DIAGNOSIS — I700 Atherosclerosis of aorta: Secondary | ICD-10-CM | POA: Diagnosis not present

## 2015-05-06 DIAGNOSIS — R918 Other nonspecific abnormal finding of lung field: Secondary | ICD-10-CM | POA: Diagnosis not present

## 2015-05-06 DIAGNOSIS — C669 Malignant neoplasm of unspecified ureter: Secondary | ICD-10-CM | POA: Diagnosis not present

## 2015-05-06 DIAGNOSIS — C680 Malignant neoplasm of urethra: Secondary | ICD-10-CM

## 2015-05-06 DIAGNOSIS — C661 Malignant neoplasm of right ureter: Secondary | ICD-10-CM | POA: Diagnosis not present

## 2015-05-06 DIAGNOSIS — C642 Malignant neoplasm of left kidney, except renal pelvis: Secondary | ICD-10-CM | POA: Insufficient documentation

## 2015-05-06 LAB — GLUCOSE, CAPILLARY: Glucose-Capillary: 109 mg/dL — ABNORMAL HIGH (ref 65–99)

## 2015-05-06 MED ORDER — FLUDEOXYGLUCOSE F - 18 (FDG) INJECTION
8.4600 | Freq: Once | INTRAVENOUS | Status: DC | PRN
  Administered 2015-05-06: 8.46 via INTRAVENOUS
  Filled 2015-05-06: qty 8.46

## 2015-05-06 NOTE — Telephone Encounter (Signed)
Spoke with pt regarding new pt appt and pt's wife confirmed appt.

## 2015-05-06 NOTE — Telephone Encounter (Signed)
Lt mess regarding new pt appt and mailed out calendar.

## 2015-05-16 ENCOUNTER — Ambulatory Visit (HOSPITAL_BASED_OUTPATIENT_CLINIC_OR_DEPARTMENT_OTHER): Payer: Medicare Other | Admitting: Oncology

## 2015-05-16 ENCOUNTER — Other Ambulatory Visit: Payer: Self-pay | Admitting: *Deleted

## 2015-05-16 ENCOUNTER — Telehealth: Payer: Self-pay | Admitting: Oncology

## 2015-05-16 VITALS — BP 107/63 | HR 64 | Temp 98.2°F | Resp 18 | Ht 69.0 in | Wt 176.2 lb

## 2015-05-16 DIAGNOSIS — N2889 Other specified disorders of kidney and ureter: Secondary | ICD-10-CM

## 2015-05-16 DIAGNOSIS — C7800 Secondary malignant neoplasm of unspecified lung: Secondary | ICD-10-CM

## 2015-05-16 DIAGNOSIS — Z87891 Personal history of nicotine dependence: Secondary | ICD-10-CM

## 2015-05-16 DIAGNOSIS — J449 Chronic obstructive pulmonary disease, unspecified: Secondary | ICD-10-CM | POA: Diagnosis not present

## 2015-05-16 DIAGNOSIS — I2510 Atherosclerotic heart disease of native coronary artery without angina pectoris: Secondary | ICD-10-CM

## 2015-05-16 DIAGNOSIS — R05 Cough: Secondary | ICD-10-CM

## 2015-05-16 DIAGNOSIS — C680 Malignant neoplasm of urethra: Secondary | ICD-10-CM

## 2015-05-16 DIAGNOSIS — C689 Malignant neoplasm of urinary organ, unspecified: Secondary | ICD-10-CM

## 2015-05-16 MED ORDER — DEXAMETHASONE 4 MG PO TABS: ORAL_TABLET | ORAL | Status: DC

## 2015-05-16 MED ORDER — ONDANSETRON HCL 8 MG PO TABS: 8.0000 mg | ORAL_TABLET | Freq: Three times a day (TID) | ORAL | Status: DC | PRN

## 2015-05-16 MED ORDER — LIDOCAINE-PRILOCAINE 2.5-2.5 % EX CREA: 1.0000 "application " | TOPICAL_CREAM | CUTANEOUS | Status: DC | PRN

## 2015-05-16 MED ORDER — LIDOCAINE-PRILOCAINE 2.5-2.5 % EX CREA: TOPICAL_CREAM | CUTANEOUS | Status: AC

## 2015-05-16 NOTE — Telephone Encounter (Signed)
Left message for patient re next appointment for ched on 11/22. Message to [name] [name] re tx 11/29 awaiting response. Patient will be contacted re start date for  tx and other appointments.

## 2015-05-16 NOTE — Consult Note (Signed)
Reason for Referral: Urothelial cancer.   HPI: 69 year old gentleman currently of [place] where he lived the majority of his life. He gentleman with history of heavy tobacco use, COPD and coronary disease. He is status post coronary artery bypass in 2010. He developed intermittent hematuria, last 4 years and his evaluation have been unrevealing in the past. Most recently he developed more brisk hematuria and was evaluated by Dr. [name] and his evaluation included a CT scan of the abdomen and pelvis done on 03/24/2015 which showed 4.9 cm mass in the upper pole of the left kidney with features suggesting transitional cell carcinoma. Multiple pulmonary nodules were noted suspicious for metastatic disease. He underwent a image guided biopsy of that left kidney mass on 04/15/2015 which showed urothelial carcinoma. He also underwent a cystoscopy, left retrograde pyelogram and left ureteral stent was placed on 04/08/2015. A PET CT scan done on 05/06/2015 showed a left renal primary tumor with hypermetabolic activity in the renal pelvis. No abdominal adenopathy noted. The chest portion showed innumerable hypermetabolic pulmonary nodules with the index lesion showed a left lower lobe cavitary lesion measuring 1.5 cm with an SUV of 8.2. There is an index right lower lobe cavitary lesion measuring 1.8 cm with an SUV of 13.4. This on these findings, patient was referred to me for an evaluation. Clinically, he reports very little symptoms at this time. He reports his hematuria have resolved and have reported some intermittent left-sided flank pain which have also resolved. He does not report any back pain or any bone pain. Has not reported any decline in energy her performance status. Continues to be active and performs activities of daily living.  He does not report any headaches, blurry vision, syncope or seizures. He does not report any fevers, chills, sweats with loss he does report some appetite changes. He does  not report any chest pain, palpitation, orthopnea or leg edema. He does not report any wheezing or hemoptysis. He does report chronic cough that is not productive. He does not report any chest pain, palpitation, orthopnea, leg edema. He does not report any nausea, vomiting, abdominal pain does report occasional dyspepsia. He does not report any frequency, urgency or hesitancy. He does not report any skeletal complaints. Remaining review of systems unremarkable.   Past Medical History  Diagnosis Date  . COPD (chronic obstructive pulmonary disease) ([place])     Quit smoking 10/11  . ED (erectile dysfunction) of organic origin   . Hypertension   . GERD (gastroesophageal reflux disease)   . CAD (coronary artery disease)     Exertional chest pain prompted LHC 10/11 showing EF 55%, mild inferior hypokinesis, 90% prox RCA, 70% mid RCA, 80% distal RCA, 70% ostial PDA, 70% mPLV, 90% mid OM1 (large), 90-95% prox LAD. Pt had CABG with LIMA-LAD, SVG-OMG1, seq SVG-PDA/PLV  . Atrial fibrillation ([facility])     Breif, post-op CABG  :  Past Surgical History  Procedure Laterality Date  . Coronary artery bypass graft      LIMA-LAD, SVG-OM1, seq SVG-PDA/PLV  . Cystoscopy with retrograde pyelogram, ureteroscopy and stent placement Left 04/08/2015    Procedure: CYSTOSCOPY WITH LEFT  RETROGRADE PYELOGRAM, BALLOON DILATION LEFT URETER, LEFT URETEROSCOPY,LEFT STENT PLACEMENT;  Surgeon: Nickie Retort, MD;  Location: [facility];  Service: Urology;  Laterality: Left;  :   Current outpatient prescriptions:  .  acetaminophen (TYLENOL) 325 MG tablet, Take 650 mg by mouth every 6 (six) hours as needed for moderate pain or headache. , Disp: , Rfl:  .  atorvastatin (LIPITOR) 40 MG tablet, Take 1 tablet (40 mg total) by mouth daily., Disp: 90 tablet, Rfl: 3 .  Cholecalciferol (VITAMIN D) 1000 UNITS capsule, Take 1,000 Units by mouth daily.  , Disp: , Rfl:  .  lisinopril (PRINIVIL,ZESTRIL) 5 MG tablet, Take 1 tablet (5 mg total)  by mouth daily. (Patient taking differently: Take 5 mg by mouth every evening. ), Disp: 90 tablet, Rfl: 3 .  metoprolol tartrate (LOPRESSOR) 25 MG tablet, Take 0.5 tablets (12.5 mg total) by mouth 2 (two) times daily., Disp: 90 tablet, Rfl: 3 .  polycarbophil (FIBERCON) 625 MG tablet, Take 625 mg by mouth daily. , Disp: , Rfl:  .  ranitidine (ZANTAC) 150 MG tablet, Take 150 mg by mouth 2 (two) times daily., Disp: , Rfl:  .  sildenafil (VIAGRA) 50 MG tablet, Take 50 mg by mouth as needed for erectile dysfunction. , Disp: , Rfl:  .  dexamethasone (DECADRON) 4 MG tablet, Take one tablet twice a day for 4 days after each long chemotherapy., Disp: 40 tablet, Rfl: 1 .  docusate sodium (COLACE) 100 MG capsule, Take 1 capsule (100 mg total) by mouth 2 (two) times daily. (Patient not taking: Reported on 05/16/2015), Disp: 10 capsule, Rfl: 0 .  lidocaine-prilocaine (EMLA) cream, Apply 1 application topically as needed., Disp: 30 g, Rfl: 0 .  nitroGLYCERIN (NITROSTAT) 0.4 MG SL tablet, Place 1 tablet (0.4 mg total) under the tongue every 5 (five) minutes as needed. May repeat up to 3 doses. (Patient not taking: Reported on 05/16/2015), Disp: 100 tablet, Rfl: 3 .  ondansetron (ZOFRAN) 8 MG tablet, Take 1 tablet (8 mg total) by mouth every 8 (eight) hours as needed for nausea or vomiting., Disp: 20 tablet, Rfl: 0:  Allergies  Allergen Reactions  . Hydrocodone Other (See Comments)    Became too sedated  :  Family History  Problem Relation Age of Onset  . Arthritis Mother   . Hypertension Mother   . Arthritis Father   . Hypertension Father   . Lung cancer Father   . Heart attack Neg Hx   . Stroke Maternal Uncle   . Sudden death Mother 15  :  Social History   Social History  . Marital Status: Married    Spouse Name: N/A  . Number of Children: N/A  . Years of Education: N/A   Occupational History  . 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 Smoking status:  Former Smoker    Quit date: 07/07/2009  . Smokeless tobacco: Former Systems developer  . Alcohol Use: No  . Drug Use: No  . Sexual Activity: Not on file   Other Topics Concern  . Not on file   Social History Narrative   Married   3 healthy children   Regular exercise   Caffeinated beverages   Dentures      Designated Party Form signed on 01/06/2010 appointing Ward Chatters. May leave message on cell phone.  :  Pertinent items are noted in HPI.  Exam: ECOG 0 Blood pressure 107/63, pulse 64, temperature 98.2 F (36.8 C), temperature source Oral, resp. rate 18, height 5\' 9"  (1.753 m), weight 176 lb 3.2 oz (79.924 kg), SpO2 100 %. General appearance: alert and cooperative not in any distress. Head: Normocephalic, without obvious abnormality Throat: lips, mucosa, and tongue normal; teeth and gums normal. No oral ulcers or lesions. Neck: no adenopathy Back: negative no lesions or rashes. Resp: clear to  auscultation bilaterally Chest wall: no tenderness Cardio: regular rate and rhythm, S1, S2 normal, no murmur, click, rub or gallop GI: soft, non-tender; bowel sounds normal; no masses,  no organomegaly no shifting dullness or ascites. Extremities: extremities normal, atraumatic, no cyanosis or edema Pulses: 2+ and symmetric Skin: Skin color, texture, turgor normal. No rashes or lesions Lymph nodes: Cervical, supraclavicular, and axillary nodes normal.  CBC    Component Value Date/Time   WBC 9.0 04/15/2015 0826   RBC 4.25 04/15/2015 0826   HGB 13.6 04/15/2015 0826   HCT 40.3 04/15/2015 0826   PLT 251 04/15/2015 0826   MCV 94.8 04/15/2015 0826   MCH 32.0 04/15/2015 0826   MCHC 33.7 04/15/2015 0826   RDW 12.2 04/15/2015 0826   LYMPHSABS 1.5 04/17/2010 0948   MONOABS 0.8 04/17/2010 0948   EOSABS 0.1 04/17/2010 0948   BASOSABS 0.0 04/17/2010 0948      Chemistry      Component Value Date/Time   NA 136 04/02/2015 1125   K 4.1 04/02/2015 1125   CL 104 04/02/2015 1125   CO2 25  04/02/2015 1125   BUN 18 04/02/2015 1125   CREATININE 0.90 04/02/2015 1125      Component Value Date/Time   CALCIUM 9.4 04/02/2015 1125   ALKPHOS 82 03/05/2015 0831   AST 19 03/05/2015 0831   ALT 17 03/05/2015 0831   BILITOT 0.8 03/05/2015 0831        Nm Pet Image Initial (pi) Skull Base To Thigh  05/06/2015  CLINICAL DATA:  Initial treatment strategy for staging of ureteral carcinoma. EXAM: NUCLEAR MEDICINE PET SKULL BASE TO THIGH TECHNIQUE: 8.5 mCi F-18 FDG was injected intravenously. Full-ring PET imaging was performed from the skull base to thigh after the radiotracer. CT data was obtained and used for attenuation correction and anatomic localization. FASTING BLOOD GLUCOSE:  Value: 109 mg/dl COMPARISON:  04/07/2015 chest CT.  03/24/2015 abdominal pelvic CT. FINDINGS: NECK No areas of abnormal hypermetabolism. CHEST Innumerable hypermetabolic pulmonary nodules, many of which are cavitary. An index left lower lobe cavitary nodule measures 1.5 cm and a S.U.V. max of 8.2 (image 39, series 6). Index right lower lobe cavitary nodule measures 1.8 cm and a S.U.V. max of 13.4 (image 34, series 6). No thoracic nodal hypermetabolism. ABDOMEN/PELVIS Hypermetabolism corresponding to the central inter and upper pole left renal mass. Hypermetabolism difficult to quantify, secondary to concurrent urinary excretion. Measures a S.U.V. max of 62.5 (image 120, series 4). No abdominal pelvic nodal hypermetabolism. SKELETON No abnormal marrow activity. CT IMAGES PERFORMED FOR ATTENUATION CORRECTION Bilateral carotid atherosclerosis. No cervical adenopathy. Chest abdomen and pelvic findings deferred to recent diagnostic CTs. Pectus excavatum deformity. Median sternotomy. No consolidation or other acute superimposed process in the lungs. Left ureteric stent in place, without hydronephrosis. Left hepatic lobe cyst. Aortic and branch vessel atherosclerosis. Mild prostatomegaly. IMPRESSION: Left renal primary with  extensive hypermetabolic nodal metastasis. Electronically Signed   By: Abigail Miyamoto M.D.   On: 05/06/2015 11:14    Assessment and Plan:   69 year old gentleman with the following issues:  1. 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 He did have a pathological confirmation done on 04/15/2015 with the specimen showed urothelial carcinoma.  The natural course of stage IV urothelial carcinoma arising from the upper genitourinary tract was discussed today including different treatment options. He  certainly has stage IV disease and the only treatment modality to be considered up front would be systemic chemotherapy. Multi agent platinum based chemotherapy would serve as a palliative approach an attempt to reduce tumor volume and rarely can result in a complete response of his disease outside of the renal pelvis and might lend itself to a possible surgical resection. This particular scenario is rather rare and chemotherapy will likely be palliative in this setting.  The logistics of administration of cisplatin and gemcitabine chemotherapy were reviewed specifically. Complications would include nausea, vomiting, myelosuppression, neutropenia, neutropenic sepsis, infusion related complications among others. Renal toxicity is certainly important at this setting and the implication of nephrotoxicity associated with cisplatin were reviewed extensively. Chemotherapy will be administered on day 1 and day 8 out of a 21 day cycle. Day 1 will include gemcitabine and cisplatin and day 8 will be gemcitabine alone. Gemcitabine will be given at 1000 mg/m and cisplatin at 70 mg/m. After 3 cycles of therapy, repeat PET scan will be obtained and depending on response 3 more cycles can be given at that time.  After discussing the risks and benefits the patient  is agreeable to proceed after chemotherapy education class.  2. Nausea and GI complication prophylaxis: Description for Zofran as well as dexamethasone was given to the patient to prevent delayed nausea and vomiting. He will take dexamethasone at 4 mg twice a day for 4 days after day 1 of each cycle of chemotherapy.  3. IV access: Risks and benefits of a Port-A-Cath insertion were reviewed. These risks include thrombosis, bleeding and infection. He is agreeable to proceed and EMLA cream was given to the patient.  4. Renal function prophylaxis: Instructions to avoid nephrotoxic drugs were reviewed with the patient. He is include nonsteroidal anti-inflammatories among others.  5. I anticipate the start of chemotherapy on November 29 and I will evaluate him on day 8 of cycle 1 of chemotherapy to discuss any complications.  All his questions were answered to his satisfaction.

## 2015-05-16 NOTE — Progress Notes (Signed)
Please see consult note.  

## 2015-05-18 ENCOUNTER — Encounter: Payer: Self-pay | Admitting: Oncology

## 2015-05-19 ENCOUNTER — Other Ambulatory Visit: Payer: Self-pay | Admitting: *Deleted

## 2015-05-19 ENCOUNTER — Other Ambulatory Visit: Payer: Self-pay | Admitting: Oncology

## 2015-05-19 DIAGNOSIS — R059 Cough, unspecified: Secondary | ICD-10-CM

## 2015-05-19 DIAGNOSIS — R05 Cough: Secondary | ICD-10-CM

## 2015-05-19 MED ORDER — HYDROCODONE-HOMATROPINE 5-1.5 MG/5ML PO SYRP: 5.0000 mL | ORAL_SOLUTION | Freq: Four times a day (QID) | ORAL | Status: DC | PRN

## 2015-05-19 MED ORDER — BENZONATATE 200 MG PO CAPS: 200.0000 mg | ORAL_CAPSULE | Freq: Four times a day (QID) | ORAL | Status: DC | PRN

## 2015-05-19 NOTE — Telephone Encounter (Signed)
Patient calling to c/o dry cough and tickle in his throat for several weeks. Goes away when he lies down at hs. Has tried otc cough medicine and drops, only helps for a little while. Per dr [name], tessalon pearls called in to patient's pharmacy. Patient notified.

## 2015-05-20 ENCOUNTER — Encounter: Payer: Self-pay | Admitting: *Deleted

## 2015-05-20 ENCOUNTER — Other Ambulatory Visit: Payer: Self-pay | Admitting: Radiology

## 2015-05-20 ENCOUNTER — Other Ambulatory Visit: Payer: Medicare Other

## 2015-05-21 ENCOUNTER — Other Ambulatory Visit: Payer: Self-pay | Admitting: Oncology

## 2015-05-21 ENCOUNTER — Ambulatory Visit (HOSPITAL_COMMUNITY)
Admission: RE | Admit: 2015-05-21 | Discharge: 2015-05-21 | Disposition: A | Discharge status: Home / Self Care | Payer: Medicare Other | Source: Ambulatory Visit | Attending: Oncology | Admitting: Oncology

## 2015-05-21 ENCOUNTER — Encounter (HOSPITAL_COMMUNITY): Payer: Self-pay

## 2015-05-21 ENCOUNTER — Ambulatory Visit: Payer: Medicare Other | Admitting: Oncology

## 2015-05-21 ENCOUNTER — Telehealth: Payer: Self-pay | Admitting: Oncology

## 2015-05-21 ENCOUNTER — Telehealth: Payer: Self-pay | Admitting: *Deleted

## 2015-05-21 DIAGNOSIS — I4891 Unspecified atrial fibrillation: Secondary | ICD-10-CM | POA: Insufficient documentation

## 2015-05-21 DIAGNOSIS — I2510 Atherosclerotic heart disease of native coronary artery without angina pectoris: Secondary | ICD-10-CM | POA: Insufficient documentation

## 2015-05-21 DIAGNOSIS — Z452 Encounter for adjustment and management of vascular access device: Secondary | ICD-10-CM | POA: Diagnosis not present

## 2015-05-21 DIAGNOSIS — J449 Chronic obstructive pulmonary disease, unspecified: Secondary | ICD-10-CM | POA: Insufficient documentation

## 2015-05-21 DIAGNOSIS — K219 Gastro-esophageal reflux disease without esophagitis: Secondary | ICD-10-CM | POA: Diagnosis not present

## 2015-05-21 DIAGNOSIS — C689 Malignant neoplasm of urinary organ, unspecified: Secondary | ICD-10-CM | POA: Diagnosis not present

## 2015-05-21 DIAGNOSIS — Z951 Presence of aortocoronary bypass graft: Secondary | ICD-10-CM | POA: Diagnosis not present

## 2015-05-21 DIAGNOSIS — I10 Essential (primary) hypertension: Secondary | ICD-10-CM | POA: Diagnosis not present

## 2015-05-21 DIAGNOSIS — Z87891 Personal history of nicotine dependence: Secondary | ICD-10-CM | POA: Diagnosis not present

## 2015-05-21 DIAGNOSIS — C688 Malignant neoplasm of overlapping sites of urinary organs: Secondary | ICD-10-CM | POA: Insufficient documentation

## 2015-05-21 LAB — PROTIME-INR
INR: 1.1 (ref 0.00–1.49)
Prothrombin Time: 14.4 seconds (ref 11.6–15.2)

## 2015-05-21 LAB — BASIC METABOLIC PANEL
Anion gap: 7 (ref 5–15)
BUN: 18 mg/dL (ref 6–20)
CHLORIDE: 104 mmol/L (ref 101–111)
CO2: 25 mmol/L (ref 22–32)
CREATININE: 1.04 mg/dL (ref 0.61–1.24)
Calcium: 9.4 mg/dL (ref 8.9–10.3)
GFR calc non Af Amer: >60 mL/min (ref >60)
Glucose, Bld: 114 mg/dL — ABNORMAL HIGH (ref 65–99)
POTASSIUM: 4.1 mmol/L (ref 3.5–5.1)
SODIUM: 136 mmol/L (ref 135–145)

## 2015-05-21 LAB — CBC
HCT: 40.2 % (ref 39.0–52.0)
Hemoglobin: 13.1 g/dL (ref 13.0–17.0)
MCH: 31.3 pg (ref 26.0–34.0)
MCHC: 32.6 g/dL (ref 30.0–36.0)
MCV: 96.2 fL (ref 78.0–100.0)
PLATELETS: 292 10*3/uL (ref 150–400)
RBC: 4.18 MIL/uL — AB (ref 4.22–5.81)
RDW: 12.6 % (ref 11.5–15.5)
WBC: 9.7 10*3/uL (ref 4.0–10.5)

## 2015-05-21 LAB — APTT: aPTT: 28 seconds (ref 24–37)

## 2015-05-21 MED ORDER — FENTANYL CITRATE (PF) 100 MCG/2ML IJ SOLN
INTRAMUSCULAR | Status: AC | PRN
  Administered 2015-05-21 (×2): 50 ug via INTRAVENOUS

## 2015-05-21 MED ORDER — MIDAZOLAM HCL 2 MG/2ML IJ SOLN
INTRAMUSCULAR | Status: AC
  Filled 2015-05-21: qty 2

## 2015-05-21 MED ORDER — CEFAZOLIN SODIUM-DEXTROSE 2-3 GM-% IV SOLR
2.0000 g | INTRAVENOUS | Status: AC
  Administered 2015-05-21: 2 g via INTRAVENOUS

## 2015-05-21 MED ORDER — HEPARIN SOD (PORK) LOCK FLUSH 100 UNIT/ML IV SOLN
INTRAVENOUS | Status: AC
  Filled 2015-05-21: qty 5

## 2015-05-21 MED ORDER — LIDOCAINE-EPINEPHRINE (PF) 1 %-1:200000 IJ SOLN
INTRAMUSCULAR | Status: AC
  Filled 2015-05-21: qty 30

## 2015-05-21 MED ORDER — LIDOCAINE HCL 1 % IJ SOLN
INTRAMUSCULAR | Status: AC
  Filled 2015-05-21: qty 20

## 2015-05-21 MED ORDER — HEPARIN SODIUM (PORCINE) 1000 UNIT/ML IJ SOLN
INTRAMUSCULAR | Status: AC | PRN
  Administered 2015-05-21: 500 [IU] via INTRAVENOUS

## 2015-05-21 MED ORDER — MIDAZOLAM HCL 2 MG/2ML IJ SOLN
INTRAMUSCULAR | Status: AC | PRN
  Administered 2015-05-21 (×2): 1 mg via INTRAVENOUS

## 2015-05-21 MED ORDER — CEFAZOLIN SODIUM-DEXTROSE 2-3 GM-% IV SOLR
INTRAVENOUS | Status: AC
  Filled 2015-05-21: qty 50

## 2015-05-21 MED ORDER — FENTANYL CITRATE (PF) 100 MCG/2ML IJ SOLN
INTRAMUSCULAR | Status: AC
  Filled 2015-05-21: qty 2

## 2015-05-21 MED ORDER — SODIUM CHLORIDE 0.9 % IV SOLN: Freq: Once | INTRAVENOUS | Status: DC

## 2015-05-21 NOTE — Telephone Encounter (Signed)
Per staff message and POF I have scheduled appts. Advised scheduler of appts. JMW  

## 2015-05-21 NOTE — Telephone Encounter (Signed)
Called patient and left a message with 11/28 appointments

## 2015-05-21 NOTE — Procedures (Signed)
RIJV PAC SVC RA No comp/EBL 

## 2015-05-21 NOTE — Discharge Instructions (Signed)

## 2015-05-21 NOTE — Progress Notes (Signed)
Patient ID: [name] [name], male   DOB: [birth year], 69 y.o.   MRN: [id number]    Referring Physician(s): [id number]  Chief Complaint:  Urothelial cancer  Subjective:  Pt familiar to IR service from prior left renal mass biopsy on 04/15/15. He has hx of stage IV urothelial carcinoma and presents today for port a cath placement for chemotherapy. He denies recent fever/chills, HA, CP ,dyspnea, abd/back pain,N/V or abnormal bleeding. He does have dry cough.  Past Medical History  Diagnosis Date  . COPD (chronic obstructive pulmonary disease) ([place])     Quit smoking 10/11  . ED (erectile dysfunction) of organic origin   . Hypertension   . GERD (gastroesophageal reflux disease)   . CAD (coronary artery disease)     Exertional chest pain prompted LHC 10/11 showing EF 55%, mild inferior hypokinesis, 90% prox RCA, 70% mid RCA, 80% distal RCA, 70% ostial PDA, 70% mPLV, 90% mid OM1 (large), 90-95% prox LAD. Pt had CABG with LIMA-LAD, SVG-OMG1, seq SVG-PDA/PLV  . Atrial fibrillation ([place])     Breif, post-op CABG   Past Surgical History  Procedure Laterality Date  . Coronary artery bypass graft      LIMA-LAD, SVG-OM1, seq SVG-PDA/PLV  . Cystoscopy with retrograde pyelogram, ureteroscopy and stent placement Left 04/08/2015    Procedure: CYSTOSCOPY WITH LEFT  RETROGRADE PYELOGRAM, BALLOON DILATION LEFT URETER, LEFT URETEROSCOPY,LEFT STENT PLACEMENT;  Surgeon: Nickie Retort, MD;  Location: [facility];  Service: Urology;  Laterality: Left;     Allergies: Hydrocodone  Medications: Prior to Admission medications   Medication Sig Start Date End Date Taking? Authorizing Provider  acetaminophen (TYLENOL) 325 MG tablet Take 650 mg by mouth every 6 (six) hours as needed for moderate pain or headache.    Yes Historical Provider, MD  atorvastatin (LIPITOR) 40 MG tablet Take 1 tablet (40 mg total) by mouth daily. 03/07/15  Yes Larey Dresser, MD  benzonatate (TESSALON) 200 MG capsule Take 1  capsule (200 mg total) by mouth every 6 (six) hours as needed for cough. 05/19/15  Yes Wyatt Portela, MD  Cholecalciferol (VITAMIN D) 1000 UNITS capsule Take 1,000 Units by mouth daily.     Yes Historical Provider, MD  diphenhydrAMINE (SOMINEX) 25 MG tablet Take 25 mg by mouth at bedtime.   Yes Historical Provider, MD  docusate sodium (COLACE) 100 MG capsule Take 1 capsule (100 mg total) by mouth 2 (two) times daily. Patient taking differently: Take 100 mg by mouth daily as needed for mild constipation.  04/08/15  Yes Nickie Retort, MD  lisinopril (PRINIVIL,ZESTRIL) 5 MG tablet Take 1 tablet (5 mg total) by mouth daily. Patient taking differently: Take 5 mg by mouth every evening.  03/07/15  Yes Larey Dresser, MD  metoprolol tartrate (LOPRESSOR) 25 MG tablet Take 0.5 tablets (12.5 mg total) by mouth 2 (two) times daily. 03/07/15  Yes Larey Dresser, MD  polycarbophil (FIBERCON) 625 MG tablet Take 625 mg by mouth daily.    Yes Historical Provider, MD  ranitidine (ZANTAC) 150 MG tablet Take 150 mg by mouth 2 (two) times daily.   Yes Historical Provider, MD  dexamethasone (DECADRON) 4 MG tablet Take one tablet twice a day for 4 days after each long chemotherapy. 05/16/15   Wyatt Portela, MD  HYDROcodone-homatropine (HYCODAN) 5-1.5 MG/5ML syrup Take 5 mLs by mouth every 6 (six) hours as needed for cough. Patient not taking: Reported on 05/19/2015 05/19/15   Wyatt Portela, MD  lidocaine-prilocaine (EMLA)  cream Apply to port-a-cath 1-2 hours prior to acces. Cover with saran wrap. 05/16/15   Wyatt Portela, MD  nitroGLYCERIN (NITROSTAT) 0.4 MG SL tablet Place 1 tablet (0.4 mg total) under the tongue every 5 (five) minutes as needed. May repeat up to 3 doses. Patient not taking: Reported on 05/16/2015 03/07/15   Larey Dresser, MD  ondansetron (ZOFRAN) 8 MG tablet Take 1 tablet (8 mg total) by mouth every 8 (eight) hours as needed for nausea or vomiting. 05/16/15   Wyatt Portela, MD  sildenafil  (VIAGRA) 50 MG tablet Take 50 mg by mouth as needed for erectile dysfunction.     Historical Provider, MD     Vital Signs: BP 121/76 mmHg  Pulse 67  Temp(Src) 97.8 F (36.6 C) (Oral)  Resp 18  Ht 5\' 9"  (1.753 m)  Wt 168 lb (76.204 kg)  BMI 24.80 kg/m2  SpO2 100%  Physical Exam  Constitutional: He is oriented to person, place, and time. He appears well-developed and well-nourished.  Cardiovascular: Normal rate and regular rhythm.   Pulmonary/Chest: Effort normal and breath sounds normal.  Abdominal: Soft. Bowel sounds are normal. There is no tenderness.  Musculoskeletal: Normal range of motion. He exhibits no edema.  Neurological: He is alert and oriented to person, place, and time.    Imaging: No results found.  Labs:  CBC:  Recent Labs  04/02/15 1125 04/15/15 0826 05/21/15 0840  WBC 7.5 9.0 9.7  HGB 13.7 13.6 13.1  HCT 41.7 40.3 40.2  PLT 218 251 292    COAGS:  Recent Labs  04/15/15 0826  INR 1.12  APTT 23*    BMP:  Recent Labs  06/13/14 0916 03/05/15 0831 04/02/15 1125  NA 137 137 136  K 3.9 3.9 4.1  CL 105 103 104  CO2 24 27 25   GLUCOSE 106* 109* 105*  BUN 19 18 18   CALCIUM 9.6 9.4 9.4  CREATININE 0.9 0.96 0.90  GFRNONAA  --   --  >60  GFRAA  --   --  >60    LIVER FUNCTION TESTS:  Recent Labs  06/13/14 0916 03/05/15 0831  BILITOT 1.4* 0.8  AST 32 19  ALT 29 17  ALKPHOS 59 82  PROT 6.8 6.9  ALBUMIN 4.0 3.8    Assessment and Plan: Pt with hx stage IV urothelial carcinoma. Plan is for port a cath placement today for chemotherapy. Risks and benefits discussed with the patient/wife including, but not limited to bleeding, infection, pneumothorax, or fibrin sheath development and need for additional procedures.All of the patient's questions were answered, patient is agreeable to proceed.Consent signed and in chart.     Signed: D. Rowe Robert 05/21/2015, 8:55 AM   I spent a total of 15 minutes at the the patient's bedside AND  on the patient's hospital floor or unit, greater than 50% of which was counseling/coordinating care for port a cath placement

## 2015-05-26 ENCOUNTER — Other Ambulatory Visit (HOSPITAL_BASED_OUTPATIENT_CLINIC_OR_DEPARTMENT_OTHER): Payer: Medicare Other

## 2015-05-26 ENCOUNTER — Encounter: Payer: Self-pay | Admitting: Oncology

## 2015-05-26 ENCOUNTER — Ambulatory Visit (HOSPITAL_BASED_OUTPATIENT_CLINIC_OR_DEPARTMENT_OTHER): Payer: Medicare Other

## 2015-05-26 VITALS — BP 123/74 | HR 74

## 2015-05-26 DIAGNOSIS — C680 Malignant neoplasm of urethra: Secondary | ICD-10-CM

## 2015-05-26 DIAGNOSIS — Z5111 Encounter for antineoplastic chemotherapy: Secondary | ICD-10-CM | POA: Diagnosis not present

## 2015-05-26 DIAGNOSIS — C689 Malignant neoplasm of urinary organ, unspecified: Secondary | ICD-10-CM

## 2015-05-26 LAB — CBC WITH DIFFERENTIAL/PLATELET
BASO%: 0.3 % (ref 0.0–2.0)
BASOS ABS: 0 10*3/uL (ref 0.0–0.1)
EOS ABS: 0.2 10*3/uL (ref 0.0–0.5)
EOS%: 2.5 % (ref 0.0–7.0)
HCT: 39.1 % (ref 38.4–49.9)
HEMOGLOBIN: 12.8 g/dL — AB (ref 13.0–17.1)
LYMPH%: 10.4 % — ABNORMAL LOW (ref 14.0–49.0)
MCH: 31.1 pg (ref 27.2–33.4)
MCHC: 32.7 g/dL (ref 32.0–36.0)
MCV: 95.1 fL (ref 79.3–98.0)
MONO#: 1 10*3/uL — AB (ref 0.1–0.9)
MONO%: 11.3 % (ref 0.0–14.0)
NEUT%: 75.5 % — ABNORMAL HIGH (ref 39.0–75.0)
NEUTROS ABS: 6.9 10*3/uL — AB (ref 1.5–6.5)
PLATELETS: 243 10*3/uL (ref 140–400)
RBC: 4.11 10*6/uL — ABNORMAL LOW (ref 4.20–5.82)
RDW: 12.3 % (ref 11.0–14.6)
WBC: 9.2 10*3/uL (ref 4.0–10.3)
lymph#: 1 10*3/uL (ref 0.9–3.3)

## 2015-05-26 LAB — COMPREHENSIVE METABOLIC PANEL (CC13)
ALBUMIN: 3.1 g/dL — AB (ref 3.5–5.0)
ALK PHOS: 74 U/L (ref 40–150)
ALT: 13 U/L (ref 0–55)
ANION GAP: 9 meq/L (ref 3–11)
AST: 15 U/L (ref 5–34)
BILIRUBIN TOTAL: 0.68 mg/dL (ref 0.20–1.20)
BUN: 19.2 mg/dL (ref 7.0–26.0)
CALCIUM: 9.6 mg/dL (ref 8.4–10.4)
CO2: 25 mEq/L (ref 22–29)
Chloride: 103 mEq/L (ref 98–109)
Creatinine: 1.1 mg/dL (ref 0.7–1.3)
EGFR: 71 mL/min/{1.73_m2} — AB (ref >90)
GLUCOSE: 151 mg/dL — AB (ref 70–140)
POTASSIUM: 4 meq/L (ref 3.5–5.1)
SODIUM: 136 meq/L (ref 136–145)
TOTAL PROTEIN: 7.1 g/dL (ref 6.4–8.3)

## 2015-05-26 MED ORDER — PALONOSETRON HCL INJECTION 0.25 MG/5ML
INTRAVENOUS | Status: AC
  Filled 2015-05-26: qty 5

## 2015-05-26 MED ORDER — SODIUM CHLORIDE 0.9 % IJ SOLN
10.0000 mL | INTRAMUSCULAR | Status: DC | PRN
  Administered 2015-05-26: 10 mL
  Filled 2015-05-26: qty 10

## 2015-05-26 MED ORDER — PALONOSETRON HCL INJECTION 0.25 MG/5ML
0.2500 mg | Freq: Once | INTRAVENOUS | Status: AC
  Administered 2015-05-26: 0.25 mg via INTRAVENOUS

## 2015-05-26 MED ORDER — GEMCITABINE HCL CHEMO INJECTION 1 GM/26.3ML
2000.0000 mg | Freq: Once | INTRAVENOUS | Status: AC
  Administered 2015-05-26: 2000 mg via INTRAVENOUS
  Filled 2015-05-26: qty 52.6

## 2015-05-26 MED ORDER — SODIUM CHLORIDE 0.9 % IV SOLN
70.0000 mg/m2 | Freq: Once | INTRAVENOUS | Status: AC
  Administered 2015-05-26: 138 mg via INTRAVENOUS
  Filled 2015-05-26: qty 138

## 2015-05-26 MED ORDER — SODIUM CHLORIDE 0.9 % IV SOLN
Freq: Once | INTRAVENOUS | Status: AC
  Administered 2015-05-26: 10:00:00 via INTRAVENOUS

## 2015-05-26 MED ORDER — HEPARIN SOD (PORK) LOCK FLUSH 100 UNIT/ML IV SOLN
500.0000 [IU] | Freq: Once | INTRAVENOUS | Status: AC | PRN
  Administered 2015-05-26: 500 [IU]
  Filled 2015-05-26: qty 5

## 2015-05-26 MED ORDER — SODIUM CHLORIDE 0.9 % IV SOLN
Freq: Once | INTRAVENOUS | Status: AC
  Administered 2015-05-26: 11:00:00 via INTRAVENOUS
  Filled 2015-05-26: qty 5

## 2015-05-26 MED ORDER — POTASSIUM CHLORIDE 2 MEQ/ML IV SOLN
Freq: Once | INTRAVENOUS | Status: AC
  Administered 2015-05-26: 10:00:00 via INTRAVENOUS
  Filled 2015-05-26: qty 10

## 2015-05-26 NOTE — Progress Notes (Signed)
Introduced myself as his FA.  Informed him that I researched different foundations that could possibly assist with his copay for chemotherapy.  Unfortunately, at this time there aren't any foundations that will cover his Dx.  I informed him of the [place], what it covers and what's needed to apply.  If interested he will bring me or Marguarite Arbour his proof of income for his entire household.  He has my card for any questions or concerns he may have in the future.

## 2015-05-28 ENCOUNTER — Telehealth: Payer: Self-pay | Admitting: *Deleted

## 2015-05-28 NOTE — Telephone Encounter (Signed)
This RN spoke with pt per receiving cisplatin and gemzar on Monday 11/28.  Only noted concern is " mild acid reflux ".  Pt is on decadron as post med for nausea.  Appetite is fair and patient is able to maintain hydration.  Per verification [name] [name] is taking zantac bid ( has for years ).  Inquiry will be sent to MD and RN at desk for possible additional recommendations and patient contact.  Return call number [phone]

## 2015-06-02 ENCOUNTER — Encounter: Payer: Self-pay | Admitting: *Deleted

## 2015-06-03 ENCOUNTER — Other Ambulatory Visit: Payer: Self-pay | Admitting: *Deleted

## 2015-06-03 ENCOUNTER — Other Ambulatory Visit (HOSPITAL_BASED_OUTPATIENT_CLINIC_OR_DEPARTMENT_OTHER): Payer: Medicare Other

## 2015-06-03 ENCOUNTER — Ambulatory Visit (HOSPITAL_BASED_OUTPATIENT_CLINIC_OR_DEPARTMENT_OTHER): Payer: Medicare Other

## 2015-06-03 ENCOUNTER — Telehealth: Payer: Self-pay | Admitting: Oncology

## 2015-06-03 ENCOUNTER — Ambulatory Visit (HOSPITAL_BASED_OUTPATIENT_CLINIC_OR_DEPARTMENT_OTHER): Payer: Medicare Other | Admitting: Oncology

## 2015-06-03 VITALS — BP 115/64 | HR 74 | Temp 98.2°F | Resp 20 | Ht 69.0 in | Wt 171.9 lb

## 2015-06-03 DIAGNOSIS — C689 Malignant neoplasm of urinary organ, unspecified: Secondary | ICD-10-CM

## 2015-06-03 DIAGNOSIS — C680 Malignant neoplasm of urethra: Secondary | ICD-10-CM

## 2015-06-03 DIAGNOSIS — K5900 Constipation, unspecified: Secondary | ICD-10-CM

## 2015-06-03 DIAGNOSIS — C7800 Secondary malignant neoplasm of unspecified lung: Secondary | ICD-10-CM

## 2015-06-03 DIAGNOSIS — R05 Cough: Secondary | ICD-10-CM

## 2015-06-03 DIAGNOSIS — Z5111 Encounter for antineoplastic chemotherapy: Secondary | ICD-10-CM

## 2015-06-03 LAB — CBC WITH DIFFERENTIAL/PLATELET
BASO%: 0.2 % (ref 0.0–2.0)
BASOS ABS: 0 10*3/uL (ref 0.0–0.1)
EOS%: 2.7 % (ref 0.0–7.0)
Eosinophils Absolute: 0.2 10*3/uL (ref 0.0–0.5)
HEMATOCRIT: 37.7 % — AB (ref 38.4–49.9)
HEMOGLOBIN: 12.5 g/dL — AB (ref 13.0–17.1)
LYMPH#: 0.9 10*3/uL (ref 0.9–3.3)
LYMPH%: 14.2 % (ref 14.0–49.0)
MCH: 30.7 pg (ref 27.2–33.4)
MCHC: 33.1 g/dL (ref 32.0–36.0)
MCV: 92.8 fL (ref 79.3–98.0)
MONO#: 0.2 10*3/uL (ref 0.1–0.9)
MONO%: 3.3 % (ref 0.0–14.0)
NEUT#: 5.3 10*3/uL (ref 1.5–6.5)
NEUT%: 79.6 % — AB (ref 39.0–75.0)
Platelets: 126 10*3/uL — ABNORMAL LOW (ref 140–400)
RBC: 4.07 10*6/uL — ABNORMAL LOW (ref 4.20–5.82)
RDW: 12.7 % (ref 11.0–14.6)
WBC: 6.6 10*3/uL (ref 4.0–10.3)

## 2015-06-03 LAB — COMPREHENSIVE METABOLIC PANEL
ALBUMIN: 2.9 g/dL — AB (ref 3.5–5.0)
ALK PHOS: 85 U/L (ref 40–150)
ALT: 20 U/L (ref 0–55)
ANION GAP: 8 meq/L (ref 3–11)
AST: 19 U/L (ref 5–34)
BUN: 26.4 mg/dL — ABNORMAL HIGH (ref 7.0–26.0)
CO2: 25 mEq/L (ref 22–29)
Calcium: 9.6 mg/dL (ref 8.4–10.4)
Chloride: 100 mEq/L (ref 98–109)
Creatinine: 1.4 mg/dL — ABNORMAL HIGH (ref 0.7–1.3)
EGFR: 51 mL/min/{1.73_m2} — AB (ref >90)
GLUCOSE: 150 mg/dL — AB (ref 70–140)
POTASSIUM: 4.5 meq/L (ref 3.5–5.1)
SODIUM: 133 meq/L — AB (ref 136–145)
Total Bilirubin: 0.67 mg/dL (ref 0.20–1.20)
Total Protein: 6.8 g/dL (ref 6.4–8.3)

## 2015-06-03 MED ORDER — SENNOSIDES-DOCUSATE SODIUM 8.6-50 MG PO TABS: 1.0000 | ORAL_TABLET | Freq: Two times a day (BID) | ORAL | Status: DC

## 2015-06-03 MED ORDER — PROCHLORPERAZINE MALEATE 10 MG PO TABS
10.0000 mg | ORAL_TABLET | Freq: Once | ORAL | Status: AC
  Administered 2015-06-03: 10 mg via ORAL

## 2015-06-03 MED ORDER — SODIUM CHLORIDE 0.9 % IJ SOLN
10.0000 mL | INTRAMUSCULAR | Status: DC | PRN
  Administered 2015-06-03: 10 mL
  Filled 2015-06-03: qty 10

## 2015-06-03 MED ORDER — HYDROCODONE-HOMATROPINE 5-1.5 MG/5ML PO SYRP: 5.0000 mL | ORAL_SOLUTION | Freq: Four times a day (QID) | ORAL | Status: DC | PRN

## 2015-06-03 MED ORDER — PROCHLORPERAZINE MALEATE 10 MG PO TABS: 10.0000 mg | ORAL_TABLET | Freq: Four times a day (QID) | ORAL | Status: DC | PRN

## 2015-06-03 MED ORDER — SODIUM CHLORIDE 0.9 % IV SOLN
2000.0000 mg | Freq: Once | INTRAVENOUS | Status: AC
  Administered 2015-06-03: 2000 mg via INTRAVENOUS
  Filled 2015-06-03: qty 52.6

## 2015-06-03 MED ORDER — HEPARIN SOD (PORK) LOCK FLUSH 100 UNIT/ML IV SOLN
500.0000 [IU] | Freq: Once | INTRAVENOUS | Status: AC | PRN
  Administered 2015-06-03: 500 [IU]
  Filled 2015-06-03: qty 5

## 2015-06-03 MED ORDER — SODIUM CHLORIDE 0.9 % IV SOLN
Freq: Once | INTRAVENOUS | Status: AC
  Administered 2015-06-03: 11:00:00 via INTRAVENOUS

## 2015-06-03 MED ORDER — PROCHLORPERAZINE MALEATE 10 MG PO TABS
ORAL_TABLET | ORAL | Status: AC
  Filled 2015-06-03: qty 1

## 2015-06-03 NOTE — Telephone Encounter (Signed)
Gave and pritned appt sched and avs for pt for DEC and Jan 2017

## 2015-06-03 NOTE — Patient Instructions (Signed)
[facility] Discharge Instructions for Patients Receiving Chemotherapy  Today you received the following chemotherapy agents Gemzar  To help prevent nausea and vomiting after your treatment, we encourage you to take your nausea medication as prescribed   If you develop nausea and vomiting that is not controlled by your nausea medication, call the clinic.   BELOW ARE SYMPTOMS THAT SHOULD BE REPORTED IMMEDIATELY:  *FEVER GREATER THAN 100.5 F  *CHILLS WITH OR WITHOUT FEVER  NAUSEA AND VOMITING THAT IS NOT CONTROLLED WITH YOUR NAUSEA MEDICATION  *UNUSUAL SHORTNESS OF BREATH  *UNUSUAL BRUISING OR BLEEDING  TENDERNESS IN MOUTH AND THROAT WITH OR WITHOUT PRESENCE OF ULCERS  *URINARY PROBLEMS  *BOWEL PROBLEMS  UNUSUAL RASH Items with * indicate a potential emergency and should be followed up as soon as possible.  Feel free to call the clinic you have any questions or concerns. The clinic phone number is [phone].  Please show the CHEMO ALERT CARD at check-in to the Emergency Department and triage nurse.   

## 2015-06-03 NOTE — Progress Notes (Signed)
Hematology and Oncology Follow Up Visit  [name] [name] [id number] 1945-12-05 69 y.o. 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 He presented with 4.9 cm mass of the upper pole of the left kidney with documented pulmonary metastasis. Neurological confirmation done on 04/15/2015 with a biopsy showed urothelial carcinoma.   Prior Therapy:   Status post biopsy of the left renal mass done on 04/15/2015. He is status post Port-A-Cath insertion on 05/21/2015.   Current therapy: Systemic chemotherapy in the form of cisplatin and gemcitabine started on 05/27/2015. Today is day 8 of cycle 1 of therapy.  Interim History:  Mr. Borsch presents today for a follow-up visit. Since the last visit, he tolerated day 1 of cycle 1 of chemotherapy without any major complications. He did have some delayed nausea but no vomiting. He does have problem with constipation but no diarrhea. He reports dexamethasone has helped his nausea tremendously but after stopping it 4 days after chemotherapy he did have some delayed nausea. He reports his cough was much improved temporarily but now is reporting nonproductive cough again. Otherwise no neuropathy, hematuria or infusion related complications. Performance status and activity levels remain excellent.  He does not report any headaches, blurry vision, syncope or seizures. He does not report any fevers, chills, sweats with loss he does report some appetite changes. He does not report any chest pain, palpitation, orthopnea or leg edema. He does not report any wheezing or hemoptysis. He does report chronic cough. He does not report any chest pain, palpitation, orthopnea, leg edema. He does not report any  vomiting, abdominal pain does report occasional dyspepsia. He does not report any frequency, urgency or hesitancy. He does not report  any skeletal complaints. Remaining review of systems unremarkable.   Medications: I have reviewed the patient's current medications.  Current Outpatient Prescriptions  Medication Sig Dispense Refill  . acetaminophen (TYLENOL) 325 MG tablet Take 650 mg by mouth every 6 (six) hours as needed for moderate pain or headache.     [name] atorvastatin (LIPITOR) 40 MG tablet Take 1 tablet (40 mg total) by mouth daily. 90 tablet 3  . benzonatate (TESSALON) 200 MG capsule Take 1 capsule (200 mg total) by mouth every 6 (six) hours as needed for cough. 30 capsule 0  . Cholecalciferol (VITAMIN D) 1000 UNITS capsule Take 1,000 Units by mouth daily.      [name] dexamethasone (DECADRON) 4 MG tablet Take one tablet twice a day for 4 days after each long chemotherapy. 40 tablet 1  . diphenhydrAMINE (SOMINEX) 25 MG tablet Take 25 mg by mouth at bedtime.    . docusate sodium (COLACE) 100 MG capsule Take 1 capsule (100 mg total) by mouth 2 (two) times daily. (Patient taking differently: Take 100 mg by mouth daily as needed for mild constipation. ) 10 capsule 0  . lidocaine-prilocaine (EMLA) cream Apply to port-a-cath 1-2 hours prior to acces. Cover with saran wrap. 30 g 1  . lisinopril (PRINIVIL,ZESTRIL) 5 MG tablet Take 1 tablet (5 mg total) by mouth daily. (Patient taking differently: Take 5 mg by mouth every evening. ) 90 tablet 3  . metoprolol tartrate (LOPRESSOR) 25 MG tablet Take 0.5 tablets (12.5 mg total) by mouth 2 (two) times daily. 90 tablet 3  . nitroGLYCERIN (NITROSTAT) 0.4 MG SL tablet Place 1 tablet (0.4 mg total) under the tongue every 5 (five) minutes as needed. May  repeat up to 3 doses. 100 tablet 3  . ondansetron (ZOFRAN) 8 MG tablet Take 1 tablet (8 mg total) by mouth every 8 (eight) hours as needed for nausea or vomiting. 30 tablet 0  . polycarbophil (FIBERCON) 625 MG tablet Take 625 mg by mouth daily.     . ranitidine (ZANTAC) 150 MG tablet Take 150 mg by mouth 2 (two) times daily.    . sildenafil (VIAGRA)  50 MG tablet Take 50 mg by mouth as needed for erectile dysfunction.     [name] HYDROcodone-homatropine (HYCODAN) 5-1.5 MG/5ML syrup Take 5 mLs by mouth every 6 (six) hours as needed for cough. 120 mL 0  . prochlorperazine (COMPAZINE) 10 MG tablet Take 1 tablet (10 mg total) by mouth every 6 (six) hours as needed for nausea or vomiting. 30 tablet 0  . senna-docusate (SENNA S) 8.6-50 MG tablet Take 1 tablet by mouth 2 (two) times daily. 60 tablet 0   No current facility-administered medications for this visit.     Allergies:  Allergies  Allergen Reactions  . Hydrocodone Other (See Comments)    Became too sedated    Past Medical History, Surgical history, Social history, and Family History were reviewed and updated.   Physical Exam: Blood pressure 115/64, pulse 74, temperature 98.2 F (36.8 C), temperature source Oral, resp. rate 20, height 5\' 9"  (1.753 m), weight 171 lb 14.4 oz (77.973 kg), SpO2 99 %. ECOG: 0 General appearance: alert and cooperative Head: Normocephalic, without obvious abnormality Neck: no adenopathy Lymph nodes: Cervical, supraclavicular, and axillary nodes normal. Heart:regular rate and rhythm, S1, S2 normal, no murmur, click, rub or gallop Lung:chest clear, no wheezing, rales, normal symmetric air entry Abdomin: soft, non-tender, without masses or organomegaly EXT:no erythema, induration, or nodules   Lab Results: Lab Results  Component Value Date   WBC 6.6 06/03/2015   HGB 12.5* 06/03/2015   HCT 37.7* 06/03/2015   MCV 92.8 06/03/2015   PLT 126* 06/03/2015     Chemistry      Component Value Date/Time   NA 133* 06/03/2015 0948   NA 136 05/21/2015 0840   K 4.5 06/03/2015 0948   K 4.1 05/21/2015 0840   CL 104 05/21/2015 0840   CO2 25 06/03/2015 0948   CO2 25 05/21/2015 0840   BUN 26.4* 06/03/2015 0948   BUN 18 05/21/2015 0840   CREATININE 1.4* 06/03/2015 0948   CREATININE 1.04 05/21/2015 0840      Component Value Date/Time   CALCIUM 9.6 06/03/2015  0948   CALCIUM 9.4 05/21/2015 0840   ALKPHOS 85 06/03/2015 0948   ALKPHOS 82 03/05/2015 0831   AST 19 06/03/2015 0948   AST 19 03/05/2015 0831   ALT 20 06/03/2015 0948   ALT 17 03/05/2015 0831   BILITOT 0.67 06/03/2015 0948   BILITOT 0.8 03/05/2015 0831       Impression and Plan:   69 year old gentleman with the following issues:  1. 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 He did have a pathological confirmation done on 04/15/2015 with the specimen showed urothelial carcinoma.  He is currently on gemcitabine and cisplatin and received day 1 of cycle 1 on 05/27/2015. He is ready to proceed with day 8 of cycle 1. The plan is to receive 3 cycles of chemotherapy and repeat imaging studies at that time. See no  need for any dose reduction or delay.  2. Nausea and GI complication prophylaxis: I have added Compazine to his Zofran regimen will be followed. He will continue to use dexamethasone for delayed nausea and vomiting after cisplatin therapy.  3. IV access: Port-A-Cath inserted without any complications. Prescription for EMLA cream was available to the patient.  4. Renal function prophylaxis: Instructions to avoid nephrotoxic drugs were reviewed with the patient. He is include nonsteroidal anti-inflammatories among others.  5. Constipation: I have added the Senokot S to his bowel regimen to take twice a day.  6. Cough: Likely related to his pulmonary metastasis. Hycodan was given to the patient today to use as needed..  7. Follow-up: Will be on 06/17/2015 for the start of day 1 cycle 2.   [id number], MD 12/6/201610:32 AM

## 2015-06-10 ENCOUNTER — Telehealth: Payer: Self-pay | Admitting: *Deleted

## 2015-06-10 DIAGNOSIS — C689 Malignant neoplasm of urinary organ, unspecified: Secondary | ICD-10-CM

## 2015-06-10 DIAGNOSIS — N2889 Other specified disorders of kidney and ureter: Secondary | ICD-10-CM

## 2015-06-10 MED ORDER — ONDANSETRON HCL 8 MG PO TABS: 8.0000 mg | ORAL_TABLET | Freq: Three times a day (TID) | ORAL | Status: DC | PRN

## 2015-06-10 NOTE — Telephone Encounter (Signed)
Call from patient's spouse requesting refill for ondansetron.  Request order be sent to [facility] on [address] because they're open until midnight.  [facility] at her request.

## 2015-06-17 ENCOUNTER — Ambulatory Visit (HOSPITAL_BASED_OUTPATIENT_CLINIC_OR_DEPARTMENT_OTHER): Payer: Medicare Other

## 2015-06-17 ENCOUNTER — Other Ambulatory Visit (HOSPITAL_BASED_OUTPATIENT_CLINIC_OR_DEPARTMENT_OTHER): Payer: Medicare Other

## 2015-06-17 ENCOUNTER — Ambulatory Visit (HOSPITAL_BASED_OUTPATIENT_CLINIC_OR_DEPARTMENT_OTHER): Payer: Medicare Other | Admitting: Oncology

## 2015-06-17 VITALS — BP 121/63 | HR 67 | Temp 97.8°F | Resp 18 | Ht 69.0 in | Wt 167.4 lb

## 2015-06-17 DIAGNOSIS — C689 Malignant neoplasm of urinary organ, unspecified: Secondary | ICD-10-CM

## 2015-06-17 DIAGNOSIS — C7800 Secondary malignant neoplasm of unspecified lung: Secondary | ICD-10-CM

## 2015-06-17 DIAGNOSIS — Z5111 Encounter for antineoplastic chemotherapy: Secondary | ICD-10-CM

## 2015-06-17 DIAGNOSIS — C680 Malignant neoplasm of urethra: Secondary | ICD-10-CM | POA: Diagnosis not present

## 2015-06-17 DIAGNOSIS — D709 Neutropenia, unspecified: Secondary | ICD-10-CM | POA: Diagnosis not present

## 2015-06-17 DIAGNOSIS — K5900 Constipation, unspecified: Secondary | ICD-10-CM | POA: Diagnosis not present

## 2015-06-17 DIAGNOSIS — R05 Cough: Secondary | ICD-10-CM

## 2015-06-17 LAB — CBC WITH DIFFERENTIAL/PLATELET
BASO%: 0.9 % (ref 0.0–2.0)
Basophils Absolute: 0 10*3/uL (ref 0.0–0.1)
EOS ABS: 0.1 10*3/uL (ref 0.0–0.5)
EOS%: 2.2 % (ref 0.0–7.0)
HEMATOCRIT: 33.5 % — AB (ref 38.4–49.9)
HEMOGLOBIN: 11 g/dL — AB (ref 13.0–17.1)
LYMPH#: 1 10*3/uL (ref 0.9–3.3)
LYMPH%: 30.5 % (ref 14.0–49.0)
MCH: 30.7 pg (ref 27.2–33.4)
MCHC: 32.9 g/dL (ref 32.0–36.0)
MCV: 93.2 fL (ref 79.3–98.0)
MONO#: 0.8 10*3/uL (ref 0.1–0.9)
MONO%: 24.8 % — AB (ref 0.0–14.0)
NEUT%: 41.6 % (ref 39.0–75.0)
NEUTROS ABS: 1.4 10*3/uL — AB (ref 1.5–6.5)
PLATELETS: 539 10*3/uL — AB (ref 140–400)
RBC: 3.6 10*6/uL — ABNORMAL LOW (ref 4.20–5.82)
RDW: 13 % (ref 11.0–14.6)
WBC: 3.4 10*3/uL — AB (ref 4.0–10.3)

## 2015-06-17 LAB — COMPREHENSIVE METABOLIC PANEL
ALBUMIN: 3 g/dL — AB (ref 3.5–5.0)
ALK PHOS: 86 U/L (ref 40–150)
ALT: 15 U/L (ref 0–55)
AST: 18 U/L (ref 5–34)
Anion Gap: 7 mEq/L (ref 3–11)
BUN: 18.3 mg/dL (ref 7.0–26.0)
CO2: 26 mEq/L (ref 22–29)
Calcium: 9.2 mg/dL (ref 8.4–10.4)
Chloride: 105 mEq/L (ref 98–109)
Creatinine: 1 mg/dL (ref 0.7–1.3)
EGFR: 76 mL/min/{1.73_m2} — AB (ref >90)
GLUCOSE: 143 mg/dL — AB (ref 70–140)
Potassium: 4 mEq/L (ref 3.5–5.1)
SODIUM: 138 meq/L (ref 136–145)
TOTAL PROTEIN: 6.5 g/dL (ref 6.4–8.3)

## 2015-06-17 MED ORDER — CISPLATIN CHEMO INJECTION 100MG/100ML
70.0000 mg/m2 | Freq: Once | INTRAVENOUS | Status: AC
  Administered 2015-06-17: 138 mg via INTRAVENOUS
  Filled 2015-06-17: qty 138

## 2015-06-17 MED ORDER — SODIUM CHLORIDE 0.9 % IV SOLN
2000.0000 mg | Freq: Once | INTRAVENOUS | Status: AC
  Administered 2015-06-17: 2000 mg via INTRAVENOUS
  Filled 2015-06-17: qty 52.6

## 2015-06-17 MED ORDER — FOSAPREPITANT DIMEGLUMINE INJECTION 150 MG
Freq: Once | INTRAVENOUS | Status: AC
  Administered 2015-06-17: 12:00:00 via INTRAVENOUS
  Filled 2015-06-17: qty 5

## 2015-06-17 MED ORDER — SODIUM CHLORIDE 0.9 % IV SOLN
Freq: Once | INTRAVENOUS | Status: AC
  Administered 2015-06-17: 10:00:00 via INTRAVENOUS

## 2015-06-17 MED ORDER — PALONOSETRON HCL INJECTION 0.25 MG/5ML
0.2500 mg | Freq: Once | INTRAVENOUS | Status: AC
  Administered 2015-06-17: 0.25 mg via INTRAVENOUS

## 2015-06-17 MED ORDER — SODIUM CHLORIDE 0.9 % IJ SOLN
10.0000 mL | INTRAMUSCULAR | Status: DC | PRN
  Administered 2015-06-17: 10 mL
  Filled 2015-06-17: qty 10

## 2015-06-17 MED ORDER — POTASSIUM CHLORIDE 2 MEQ/ML IV SOLN
Freq: Once | INTRAVENOUS | Status: AC
  Administered 2015-06-17: 10:00:00 via INTRAVENOUS
  Filled 2015-06-17: qty 10

## 2015-06-17 MED ORDER — HEPARIN SOD (PORK) LOCK FLUSH 100 UNIT/ML IV SOLN
500.0000 [IU] | Freq: Once | INTRAVENOUS | Status: AC | PRN
Start: 2015-06-17 — End: 2015-06-17
  Administered 2015-06-17: 500 [IU]
  Filled 2015-06-17: qty 5

## 2015-06-17 NOTE — Patient Instructions (Signed)
[facility] Discharge Instructions for Patients Receiving Chemotherapy  Today you received the following chemotherapy agents Cisplatin/Gemzar  To help prevent nausea and vomiting after your treatment, we encourage you to take your nausea medication   If you develop nausea and vomiting that is not controlled by your nausea medication, call the clinic.   BELOW ARE SYMPTOMS THAT SHOULD BE REPORTED IMMEDIATELY:  *FEVER GREATER THAN 100.5 F  *CHILLS WITH OR WITHOUT FEVER  NAUSEA AND VOMITING THAT IS NOT CONTROLLED WITH YOUR NAUSEA MEDICATION  *UNUSUAL SHORTNESS OF BREATH  *UNUSUAL BRUISING OR BLEEDING  TENDERNESS IN MOUTH AND THROAT WITH OR WITHOUT PRESENCE OF ULCERS  *URINARY PROBLEMS  *BOWEL PROBLEMS  UNUSUAL RASH Items with * indicate a potential emergency and should be followed up as soon as possible.  Feel free to call the clinic you have any questions or concerns. The clinic phone number is [phone].  Please show the CHEMO ALERT CARD at check-in to the Emergency Department and triage nurse.   

## 2015-06-17 NOTE — Progress Notes (Signed)
Hematology and Oncology Follow Up Visit  [name] [name] [id number] Jan 03, 1946 69 y.o. 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 He presented with 4.9 cm mass of the upper pole of the left kidney with documented pulmonary metastasis. Neurological confirmation done on 04/15/2015 with a biopsy showed urothelial carcinoma.   Prior Therapy:   Status post biopsy of the left renal mass done on 04/15/2015. He is status post Port-A-Cath insertion on 05/21/2015.   Current therapy: Systemic chemotherapy in the form of cisplatin and gemcitabine started on 05/27/2015. Today is day 1 of cycle 2 of therapy.  Interim History:  [name] presents today for a follow-up visit. Since the last visit, he reports no recent complications. He tolerated cycle 1 of chemotherapy without any delayed effects. He reports dexamethasone has helped his nausea tremendously but that caused him to have reflux which is helped by Prilosec at this time. He reports his cough was much improved. His appetite have resumed to normal and reports no neuropathy or hearing deficits. His activity level is back to normal and his performance status is normal.  He does not report any headaches, blurry vision, syncope or seizures. He does not report any fevers, chills, sweats with loss he does report some appetite changes. He does not report any chest pain, palpitation, orthopnea or leg edema. He does not report any wheezing or hemoptysis. He does report chronic cough. He does not report any chest pain, palpitation, orthopnea, leg edema. He does not report any  vomiting, abdominal pain does report occasional dyspepsia. He does not report any frequency, urgency or hesitancy. He does not report any skeletal complaints. Remaining review of systems unremarkable.   Medications: I have reviewed the patient's  current medications.  Current Outpatient Prescriptions  Medication Sig Dispense Refill  . acetaminophen (TYLENOL) 325 MG tablet Take 650 mg by mouth every 6 (six) hours as needed for moderate pain or headache.     [name] atorvastatin (LIPITOR) 40 MG tablet Take 1 tablet (40 mg total) by mouth daily. 90 tablet 3  . benzonatate (TESSALON) 200 MG capsule Take 1 capsule (200 mg total) by mouth every 6 (six) hours as needed for cough. 30 capsule 0  . Cholecalciferol (VITAMIN D) 1000 UNITS capsule Take 1,000 Units by mouth daily.      [name] dexamethasone (DECADRON) 4 MG tablet Take one tablet twice a day for 4 days after each long chemotherapy. 40 tablet 1  . diphenhydrAMINE (SOMINEX) 25 MG tablet Take 25 mg by mouth at bedtime.    . docusate sodium (COLACE) 100 MG capsule Take 1 capsule (100 mg total) by mouth 2 (two) times daily. (Patient taking differently: Take 100 mg by mouth daily as needed for mild constipation. ) 10 capsule 0  . HYDROcodone-homatropine (HYCODAN) 5-1.5 MG/5ML syrup Take 5 mLs by mouth every 6 (six) hours as needed for cough. 120 mL 0  . lidocaine-prilocaine (EMLA) cream Apply to port-a-cath 1-2 hours prior to acces. Cover with saran wrap. 30 g 1  . lisinopril (PRINIVIL,ZESTRIL) 5 MG tablet Take 1 tablet (5 mg total) by mouth daily. (Patient taking differently: Take 5 mg by mouth every evening. ) 90 tablet 3  . metoprolol tartrate (LOPRESSOR) 25 MG tablet Take 0.5 tablets (12.5 mg total) by mouth 2 (two) times daily. 90 tablet 3  . nitroGLYCERIN (NITROSTAT) 0.4 MG SL tablet Place 1 tablet (0.4 mg  total) under the tongue every 5 (five) minutes as needed. May repeat up to 3 doses. 100 tablet 3  . omeprazole (PRILOSEC) 20 MG capsule Take 20 mg by mouth daily.    . ondansetron (ZOFRAN) 8 MG tablet Take 1 tablet (8 mg total) by mouth every 8 (eight) hours as needed for nausea or vomiting. 30 tablet 1  . polycarbophil (FIBERCON) 625 MG tablet Take 625 mg by mouth daily.     . prochlorperazine  (COMPAZINE) 10 MG tablet Take 1 tablet (10 mg total) by mouth every 6 (six) hours as needed for nausea or vomiting. 30 tablet 0  . senna-docusate (SENNA S) 8.6-50 MG tablet Take 1 tablet by mouth 2 (two) times daily. 60 tablet 0  . sildenafil (VIAGRA) 50 MG tablet Take 50 mg by mouth as needed for erectile dysfunction.      No current facility-administered medications for this visit.     Allergies:  Allergies  Allergen Reactions  . Hydrocodone Other (See Comments)    Became too sedated    Past Medical History, Surgical history, Social history, and Family History were reviewed and updated.   Physical Exam: Blood pressure 121/63, pulse 67, temperature 97.8 F (36.6 C), temperature source Oral, resp. rate 18, height 5\' 9"  (1.753 m), weight 167 lb 6.4 oz (75.932 kg), SpO2 99 %. ECOG: 0 General appearance: alert and cooperative appeared in no active distress. Head: Normocephalic, without obvious abnormality no oral ulcers or lesions. Neck: no adenopathy Lymph nodes: Cervical, supraclavicular, and axillary nodes normal. Heart:regular rate and rhythm, S1, S2 normal, no murmur, click, rub or gallop Lung:chest clear, no wheezing, rales, normal symmetric air entry Abdomin: soft, non-tender, without masses or organomegaly EXT:no erythema, induration, or nodules   Lab Results: Lab Results  Component Value Date   WBC 3.4* 06/17/2015   HGB 11.0* 06/17/2015   HCT 33.5* 06/17/2015   MCV 93.2 06/17/2015   PLT 539* 06/17/2015     Chemistry      Component Value Date/Time   NA 133* 06/03/2015 0948   NA 136 05/21/2015 0840   K 4.5 06/03/2015 0948   K 4.1 05/21/2015 0840   CL 104 05/21/2015 0840   CO2 25 06/03/2015 0948   CO2 25 05/21/2015 0840   BUN 26.4* 06/03/2015 0948   BUN 18 05/21/2015 0840   CREATININE 1.4* 06/03/2015 0948   CREATININE 1.04 05/21/2015 0840      Component Value Date/Time   CALCIUM 9.6 06/03/2015 0948   CALCIUM 9.4 05/21/2015 0840   ALKPHOS 85 06/03/2015  0948   ALKPHOS 82 03/05/2015 0831   AST 19 06/03/2015 0948   AST 19 03/05/2015 0831   ALT 20 06/03/2015 0948   ALT 17 03/05/2015 0831   BILITOT 0.67 06/03/2015 0948   BILITOT 0.8 03/05/2015 0831       Impression and Plan:   69 year old gentleman with the following issues:  1. 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 He did have a pathological confirmation done on 04/15/2015 with the specimen showed urothelial carcinoma.  He is currently on gemcitabine and cisplatin and received day 1 of cycle 1 on 05/27/2015. He is ready to proceed with day 1 of cycle 2. The plan is to proceed with 3 cycles before repeat imaging studies.  2. Nausea and GI complication prophylaxis: I have added Compazine  to his Zofran regimen will be followed. He will continue to use dexamethasone for delayed nausea and vomiting after cisplatin therapy. He did develop reflux symptoms which is aided by Prilosec which I have encouraged him to continue.  3. IV access: Port-A-Cath inserted without any complications. Prescription for EMLA cream was available to the patient.  4. Renal function prophylaxis: Instructions to avoid nephrotoxic drugs were reviewed with the patient. He is include nonsteroidal anti-inflammatories among others. Creatinine is pending from today.  5. Constipation: I have added the Senokot S to his bowel regimen to take twice a day.  6. Cough: Likely related to his pulmonary metastasis. Hycodan was given to the patient today to use as needed. His cough is much improved.  7. Neutropenia: His ANC is adequate today but day 8 of cycle 2 might be held in his [place] is less than 1000.  8. Follow-up: Will be on and 1 week for day 8 of cycle 2.   [facility], MD 12/20/20169:26 AM

## 2015-06-24 ENCOUNTER — Ambulatory Visit (HOSPITAL_BASED_OUTPATIENT_CLINIC_OR_DEPARTMENT_OTHER): Payer: Medicare Other

## 2015-06-24 ENCOUNTER — Other Ambulatory Visit (HOSPITAL_BASED_OUTPATIENT_CLINIC_OR_DEPARTMENT_OTHER): Payer: Medicare Other

## 2015-06-24 VITALS — BP 105/56 | HR 77 | Temp 98.0°F | Resp 18

## 2015-06-24 DIAGNOSIS — C689 Malignant neoplasm of urinary organ, unspecified: Secondary | ICD-10-CM

## 2015-06-24 DIAGNOSIS — Z5111 Encounter for antineoplastic chemotherapy: Secondary | ICD-10-CM

## 2015-06-24 LAB — CBC WITH DIFFERENTIAL/PLATELET
BASO%: 0.2 % (ref 0.0–2.0)
BASOS ABS: 0 10*3/uL (ref 0.0–0.1)
EOS ABS: 0 10*3/uL (ref 0.0–0.5)
EOS%: 0.5 % (ref 0.0–7.0)
HCT: 36 % — ABNORMAL LOW (ref 38.4–49.9)
HGB: 11.8 g/dL — ABNORMAL LOW (ref 13.0–17.1)
LYMPH%: 25.2 % (ref 14.0–49.0)
MCH: 30.2 pg (ref 27.2–33.4)
MCHC: 32.7 g/dL (ref 32.0–36.0)
MCV: 92.3 fL (ref 79.3–98.0)
MONO#: 0.3 10*3/uL (ref 0.1–0.9)
MONO%: 4.2 % (ref 0.0–14.0)
NEUT%: 69.9 % (ref 39.0–75.0)
NEUTROS ABS: 4.4 10*3/uL (ref 1.5–6.5)
PLATELETS: 293 10*3/uL (ref 140–400)
RBC: 3.91 10*6/uL — AB (ref 4.20–5.82)
RDW: 13.3 % (ref 11.0–14.6)
WBC: 6.2 10*3/uL (ref 4.0–10.3)
lymph#: 1.6 10*3/uL (ref 0.9–3.3)

## 2015-06-24 LAB — COMPREHENSIVE METABOLIC PANEL
ALBUMIN: 3 g/dL — AB (ref 3.5–5.0)
ALK PHOS: 90 U/L (ref 40–150)
ALT: 39 U/L (ref 0–55)
ANION GAP: 8 meq/L (ref 3–11)
AST: 26 U/L (ref 5–34)
BUN: 29.5 mg/dL — ABNORMAL HIGH (ref 7.0–26.0)
CALCIUM: 9 mg/dL (ref 8.4–10.4)
CO2: 25 mEq/L (ref 22–29)
CREATININE: 1.2 mg/dL (ref 0.7–1.3)
Chloride: 102 mEq/L (ref 98–109)
EGFR: 62 mL/min/{1.73_m2} — AB (ref >90)
Glucose: 138 mg/dl (ref 70–140)
Potassium: 4.4 mEq/L (ref 3.5–5.1)
Sodium: 135 mEq/L — ABNORMAL LOW (ref 136–145)
TOTAL PROTEIN: 6.3 g/dL — AB (ref 6.4–8.3)

## 2015-06-24 MED ORDER — SODIUM CHLORIDE 0.9 % IJ SOLN
10.0000 mL | INTRAMUSCULAR | Status: DC | PRN
  Administered 2015-06-24: 10 mL
  Filled 2015-06-24: qty 10

## 2015-06-24 MED ORDER — PROCHLORPERAZINE MALEATE 10 MG PO TABS
ORAL_TABLET | ORAL | Status: AC
  Filled 2015-06-24: qty 1

## 2015-06-24 MED ORDER — SODIUM CHLORIDE 0.9 % IV SOLN
2000.0000 mg | Freq: Once | INTRAVENOUS | Status: AC
  Administered 2015-06-24: 2000 mg via INTRAVENOUS
  Filled 2015-06-24: qty 52.6

## 2015-06-24 MED ORDER — HEPARIN SOD (PORK) LOCK FLUSH 100 UNIT/ML IV SOLN
500.0000 [IU] | Freq: Once | INTRAVENOUS | Status: AC | PRN
  Administered 2015-06-24: 500 [IU]
  Filled 2015-06-24: qty 5

## 2015-06-24 MED ORDER — PROCHLORPERAZINE MALEATE 10 MG PO TABS
10.0000 mg | ORAL_TABLET | Freq: Once | ORAL | Status: AC
  Administered 2015-06-24: 10 mg via ORAL

## 2015-06-24 MED ORDER — SODIUM CHLORIDE 0.9 % IV SOLN
Freq: Once | INTRAVENOUS | Status: AC
  Administered 2015-06-24: 14:00:00 via INTRAVENOUS

## 2015-06-24 NOTE — Patient Instructions (Signed)
[facility] Discharge Instructions for Patients Receiving Chemotherapy  Today you received the following chemotherapy agents Gemzar.  To help prevent nausea and vomiting after your treatment, we encourage you to take your nausea medication.   If you develop nausea and vomiting that is not controlled by your nausea medication, call the clinic.   BELOW ARE SYMPTOMS THAT SHOULD BE REPORTED IMMEDIATELY:  *FEVER GREATER THAN 100.5 F  *CHILLS WITH OR WITHOUT FEVER  NAUSEA AND VOMITING THAT IS NOT CONTROLLED WITH YOUR NAUSEA MEDICATION  *UNUSUAL SHORTNESS OF BREATH  *UNUSUAL BRUISING OR BLEEDING  TENDERNESS IN MOUTH AND THROAT WITH OR WITHOUT PRESENCE OF ULCERS  *URINARY PROBLEMS  *BOWEL PROBLEMS  UNUSUAL RASH Items with * indicate a potential emergency and should be followed up as soon as possible.  Feel free to call the clinic you have any questions or concerns. The clinic phone number is [phone].  Please show the CHEMO ALERT CARD at check-in to the Emergency Department and triage nurse.   

## 2015-07-08 ENCOUNTER — Ambulatory Visit (HOSPITAL_BASED_OUTPATIENT_CLINIC_OR_DEPARTMENT_OTHER): Payer: Medicare Other | Admitting: Oncology

## 2015-07-08 ENCOUNTER — Telehealth: Payer: Self-pay | Admitting: *Deleted

## 2015-07-08 ENCOUNTER — Other Ambulatory Visit (HOSPITAL_BASED_OUTPATIENT_CLINIC_OR_DEPARTMENT_OTHER): Payer: Medicare Other

## 2015-07-08 ENCOUNTER — Other Ambulatory Visit: Payer: Self-pay | Admitting: *Deleted

## 2015-07-08 ENCOUNTER — Telehealth: Payer: Self-pay | Admitting: Oncology

## 2015-07-08 ENCOUNTER — Ambulatory Visit (HOSPITAL_BASED_OUTPATIENT_CLINIC_OR_DEPARTMENT_OTHER): Payer: Medicare Other

## 2015-07-08 VITALS — BP 127/56 | HR 73

## 2015-07-08 VITALS — BP 112/62 | HR 65 | Temp 98.0°F | Resp 18 | Ht 69.0 in | Wt 173.4 lb

## 2015-07-08 DIAGNOSIS — C689 Malignant neoplasm of urinary organ, unspecified: Secondary | ICD-10-CM

## 2015-07-08 DIAGNOSIS — Z5111 Encounter for antineoplastic chemotherapy: Secondary | ICD-10-CM

## 2015-07-08 DIAGNOSIS — C7800 Secondary malignant neoplasm of unspecified lung: Secondary | ICD-10-CM

## 2015-07-08 DIAGNOSIS — K5900 Constipation, unspecified: Secondary | ICD-10-CM

## 2015-07-08 DIAGNOSIS — R05 Cough: Secondary | ICD-10-CM

## 2015-07-08 LAB — CBC WITH DIFFERENTIAL/PLATELET
BASO%: 0.7 % (ref 0.0–2.0)
BASOS ABS: 0 10*3/uL (ref 0.0–0.1)
EOS%: 3.4 % (ref 0.0–7.0)
Eosinophils Absolute: 0.2 10*3/uL (ref 0.0–0.5)
HEMATOCRIT: 32.9 % — AB (ref 38.4–49.9)
HGB: 10.8 g/dL — ABNORMAL LOW (ref 13.0–17.1)
LYMPH%: 23.5 % (ref 14.0–49.0)
MCH: 30.6 pg (ref 27.2–33.4)
MCHC: 32.8 g/dL (ref 32.0–36.0)
MCV: 93.3 fL (ref 79.3–98.0)
MONO#: 1.3 10*3/uL — AB (ref 0.1–0.9)
MONO%: 24.7 % — ABNORMAL HIGH (ref 0.0–14.0)
NEUT#: 2.6 10*3/uL (ref 1.5–6.5)
NEUT%: 47.7 % (ref 39.0–75.0)
PLATELETS: 326 10*3/uL (ref 140–400)
RBC: 3.53 10*6/uL — AB (ref 4.20–5.82)
RDW: 14 % (ref 11.0–14.6)
WBC: 5.3 10*3/uL (ref 4.0–10.3)
lymph#: 1.3 10*3/uL (ref 0.9–3.3)

## 2015-07-08 LAB — COMPREHENSIVE METABOLIC PANEL
ALT: 18 U/L (ref 0–55)
ANION GAP: 8 meq/L (ref 3–11)
AST: 20 U/L (ref 5–34)
Albumin: 3.1 g/dL — ABNORMAL LOW (ref 3.5–5.0)
Alkaline Phosphatase: 85 U/L (ref 40–150)
BUN: 13.8 mg/dL (ref 7.0–26.0)
CALCIUM: 9.4 mg/dL (ref 8.4–10.4)
CHLORIDE: 103 meq/L (ref 98–109)
CO2: 27 meq/L (ref 22–29)
CREATININE: 1 mg/dL (ref 0.7–1.3)
EGFR: 76 mL/min/{1.73_m2} — AB (ref >90)
Glucose: 122 mg/dl (ref 70–140)
POTASSIUM: 4.2 meq/L (ref 3.5–5.1)
Sodium: 138 mEq/L (ref 136–145)
Total Bilirubin: <0.3 mg/dL (ref 0.20–1.20)
Total Protein: 6.4 g/dL (ref 6.4–8.3)

## 2015-07-08 LAB — TECHNOLOGIST REVIEW

## 2015-07-08 MED ORDER — SODIUM CHLORIDE 0.9 % IV SOLN
2000.0000 mg | Freq: Once | INTRAVENOUS | Status: AC
  Administered 2015-07-08: 2000 mg via INTRAVENOUS
  Filled 2015-07-08: qty 52.6

## 2015-07-08 MED ORDER — SODIUM CHLORIDE 0.9 % IV SOLN
Freq: Once | INTRAVENOUS | Status: AC
  Administered 2015-07-08: 14:00:00 via INTRAVENOUS
  Filled 2015-07-08: qty 5

## 2015-07-08 MED ORDER — PALONOSETRON HCL INJECTION 0.25 MG/5ML
INTRAVENOUS | Status: AC
  Filled 2015-07-08: qty 5

## 2015-07-08 MED ORDER — SODIUM CHLORIDE 0.9 % IV SOLN: 1000.0000 mg/m2 | Freq: Once | INTRAVENOUS | Status: DC

## 2015-07-08 MED ORDER — MANNITOL 25 % IV SOLN
Freq: Once | INTRAVENOUS | Status: AC
  Administered 2015-07-08: 11:00:00 via INTRAVENOUS
  Filled 2015-07-08: qty 10

## 2015-07-08 MED ORDER — SODIUM CHLORIDE 0.9 % IV SOLN
Freq: Once | INTRAVENOUS | Status: AC
  Administered 2015-07-08: 11:00:00 via INTRAVENOUS

## 2015-07-08 MED ORDER — SODIUM CHLORIDE 0.9 % IJ SOLN
10.0000 mL | INTRAMUSCULAR | Status: DC | PRN
  Administered 2015-07-08: 10 mL
  Filled 2015-07-08: qty 10

## 2015-07-08 MED ORDER — PALONOSETRON HCL INJECTION 0.25 MG/5ML
0.2500 mg | Freq: Once | INTRAVENOUS | Status: AC
  Administered 2015-07-08: 0.25 mg via INTRAVENOUS

## 2015-07-08 MED ORDER — HEPARIN SOD (PORK) LOCK FLUSH 100 UNIT/ML IV SOLN
500.0000 [IU] | Freq: Once | INTRAVENOUS | Status: AC | PRN
  Administered 2015-07-08: 500 [IU]
  Filled 2015-07-08: qty 5

## 2015-07-08 MED ORDER — HYDROCODONE-HOMATROPINE 5-1.5 MG/5ML PO SYRP: 5.0000 mL | ORAL_SOLUTION | Freq: Four times a day (QID) | ORAL | Status: DC | PRN

## 2015-07-08 MED ORDER — SODIUM CHLORIDE 0.9 % IV SOLN
70.0000 mg/m2 | Freq: Once | INTRAVENOUS | Status: AC
  Administered 2015-07-08: 138 mg via INTRAVENOUS
  Filled 2015-07-08: qty 138

## 2015-07-08 NOTE — Progress Notes (Signed)
Hematology and Oncology Follow Up Visit  [name] [name] [id number] 1945-07-15 70 y.o. 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 He presented with 4.9 cm mass of the upper pole of the left kidney with documented pulmonary metastasis. Neurological confirmation done on 04/15/2015 with a biopsy showed urothelial carcinoma.   Prior Therapy:   Status post biopsy of the left renal mass done on 04/15/2015. He is status post Port-A-Cath insertion on 05/21/2015.   Current therapy: Systemic chemotherapy in the form of cisplatin and gemcitabine started on 05/27/2015. Today is day 1 of cycle 3 of therapy.  Interim History:  [name] presents today for a follow-up visit. Since the last visit, he continues to tolerate chemotherapy without any major complications. He has reported mild fatigue, nausea and weight loss.  He reports dexamethasone has helped his nausea but does report decrease in his appetite and some weight loss.  He does not report any other complications related to chemotherapy. He does not report any peripheral neuropathy, hearing loss or decline in his performance status. He reports improvement in his cough as well as constitutional symptoms of chills or sweats since the start of chemotherapy. He is no longer reporting any hematuria or urinary symptoms.  He does not report any headaches, blurry vision, syncope or seizures. He does not report any fevers, chills, sweats with loss he does report some appetite changes. He does not report any chest pain, palpitation, orthopnea or leg edema. He does not report any wheezing or hemoptysis. He does report chronic cough. He does not report any chest pain, palpitation, orthopnea, leg edema. He does not report any  vomiting, abdominal pain does report occasional dyspepsia. He does not report any frequency,  urgency or hesitancy. He does not report any skeletal complaints. Remaining review of systems unremarkable.   Medications: I have reviewed the patient's current medications.  Current Outpatient Prescriptions  Medication Sig Dispense Refill  . acetaminophen (TYLENOL) 325 MG tablet Take 650 mg by mouth every 6 (six) hours as needed for moderate pain or headache.     [name] atorvastatin (LIPITOR) 40 MG tablet Take 1 tablet (40 mg total) by mouth daily. 90 tablet 3  . Cholecalciferol (VITAMIN D) 1000 UNITS capsule Take 1,000 Units by mouth daily.      [name] dexamethasone (DECADRON) 4 MG tablet Take one tablet twice a day for 4 days after each long chemotherapy. 40 tablet 1  . diphenhydrAMINE (SOMINEX) 25 MG tablet Take 25 mg by mouth at bedtime.    . docusate sodium (COLACE) 100 MG capsule Take 1 capsule (100 mg total) by mouth 2 (two) times daily. (Patient taking differently: Take 100 mg by mouth daily as needed for mild constipation. ) 10 capsule 0  . lidocaine-prilocaine (EMLA) cream Apply to port-a-cath 1-2 hours prior to acces. Cover with saran wrap. 30 g 1  . lisinopril (PRINIVIL,ZESTRIL) 5 MG tablet Take 1 tablet (5 mg total) by mouth daily. (Patient taking differently: Take 5 mg by mouth every evening. ) 90 tablet 3  . metoprolol tartrate (LOPRESSOR) 25 MG tablet Take 0.5 tablets (12.5 mg total) by mouth 2 (two) times daily. 90 tablet 3  . omeprazole (PRILOSEC) 20 MG capsule Take 20 mg by mouth daily.    . ondansetron (ZOFRAN) 8 MG tablet Take 1 tablet (8 mg total) by mouth every 8 (eight) hours as needed for nausea or vomiting.  30 tablet 1  . polycarbophil (FIBERCON) 625 MG tablet Take 625 mg by mouth daily.     . prochlorperazine (COMPAZINE) 10 MG tablet Take 1 tablet (10 mg total) by mouth every 6 (six) hours as needed for nausea or vomiting. 30 tablet 0  . senna-docusate (SENNA S) 8.6-50 MG tablet Take 1 tablet by mouth 2 (two) times daily. 60 tablet 0  . sildenafil (VIAGRA) 50 MG tablet Take 50 mg  by mouth as needed for erectile dysfunction.     [name] HYDROcodone-homatropine (HYCODAN) 5-1.5 MG/5ML syrup Take 5 mLs by mouth every 6 (six) hours as needed for cough. 120 mL 0  . nitroGLYCERIN (NITROSTAT) 0.4 MG SL tablet Place 1 tablet (0.4 mg total) under the tongue every 5 (five) minutes as needed. May repeat up to 3 doses. (Patient not taking: Reported on 07/08/2015) 100 tablet 3   No current facility-administered medications for this visit.     Allergies:  Allergies  Allergen Reactions  . Hydrocodone Other (See Comments)    Became too sedated    Past Medical History, Surgical history, Social history, and Family History were reviewed and updated.   Physical Exam: Blood pressure 112/62, pulse 65, temperature 98 F (36.7 C), temperature source Oral, resp. rate 18, height 5\' 9"  (1.753 m), weight 173 lb 6.4 oz (78.654 kg), SpO2 100 %. ECOG: 0 General appearance: alert and cooperative well-appearing without distress. Head: Normocephalic, without obvious abnormality no oral thrush. Neck: no adenopathy Lymph nodes: Cervical, supraclavicular, and axillary nodes normal. Heart:regular rate and rhythm, S1, S2 normal, no murmur, click, rub or gallop Lung:chest clear, no wheezing, rales, normal symmetric air entry Abdomin: soft, non-tender, without masses or organomegaly no shifting dullness or ascites. EXT:no erythema, induration, or nodules   Lab Results: Lab Results  Component Value Date   WBC 5.3 07/08/2015   HGB 10.8* 07/08/2015   HCT 32.9* 07/08/2015   MCV 93.3 07/08/2015   PLT 326 07/08/2015     Chemistry      Component Value Date/Time   NA 138 07/08/2015 0937   NA 136 05/21/2015 0840   K 4.2 07/08/2015 0937   K 4.1 05/21/2015 0840   CL 104 05/21/2015 0840   CO2 27 07/08/2015 0937   CO2 25 05/21/2015 0840   BUN 13.8 07/08/2015 0937   BUN 18 05/21/2015 0840   CREATININE 1.0 07/08/2015 0937   CREATININE 1.04 05/21/2015 0840      Component Value Date/Time   CALCIUM  9.4 07/08/2015 0937   CALCIUM 9.4 05/21/2015 0840   ALKPHOS 85 07/08/2015 0937   ALKPHOS 82 03/05/2015 0831   AST 20 07/08/2015 0937   AST 19 03/05/2015 0831   ALT 18 07/08/2015 0937   ALT 17 03/05/2015 0831   BILITOT <0.30 07/08/2015 0937   BILITOT 0.8 03/05/2015 0831       Impression and Plan:   70 year old gentleman with the following issues:  1. 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 He did have a pathological confirmation done on 04/15/2015 with the specimen showed urothelial carcinoma.  He is currently on gemcitabine and cisplatin and received day 1 of cycle 1 on 05/27/2015. He is ready to proceed with day 1 of cycle 3. The plan is to complete cycle 3 and obtain imaging studies after that. After obtaining CT scan will determine the  duration of therapy and likely will require 6 cycles of he shows good response. His clinical presentation indicates response to chemotherapy and the scan will document these findings.  2. Nausea and GI complication prophylaxis: I have added Compazine to his Zofran regimen will be followed. He will continue to use dexamethasone for delayed nausea and vomiting after cisplatin therapy. Prilosec helped his reflux symptoms related to steroids.  3. IV access: Port-A-Cath inserted without any complications. No bleeding or pain associated with it. He continues to use EMLA cream as needed.  4. Renal function prophylaxis: Instructions to avoid nephrotoxic drugs were reviewed with the patient. He is include nonsteroidal anti-inflammatories among others. Creatinine continues to around baseline.  5. Constipation: I have added the Senokot S to his bowel regimen to take twice a day. His bowels seems to be improving.  6. Cough: Likely related to his pulmonary metastasis. Hycodan was given to the  patient today to use as needed. His cough has improved with chemotherapy.  7. Neutropenia: His ANC is adequate today and we'll proceed without any dose reduction on today.  8. Follow-up: Will be in one week for day 8 cycle 3 and he will be evaluated before day 1 of cycle 4 of therapy.   [id number], MD 1/10/201710:23 AM

## 2015-07-08 NOTE — Patient Instructions (Signed)
[facility] Center Discharge Instructions for Patients Receiving Chemotherapy  Today you received the following chemotherapy agents:  Gemzar and Cisplatin  To help prevent nausea and vomiting after your treatment, we encourage you to take your nausea medication as ordered per MD.   If you develop nausea and vomiting that is not controlled by your nausea medication, call the clinic.   BELOW ARE SYMPTOMS THAT SHOULD BE REPORTED IMMEDIATELY:  *FEVER GREATER THAN 100.5 F  *CHILLS WITH OR WITHOUT FEVER  NAUSEA AND VOMITING THAT IS NOT CONTROLLED WITH YOUR NAUSEA MEDICATION  *UNUSUAL SHORTNESS OF BREATH  *UNUSUAL BRUISING OR BLEEDING  TENDERNESS IN MOUTH AND THROAT WITH OR WITHOUT PRESENCE OF ULCERS  *URINARY PROBLEMS  *BOWEL PROBLEMS  UNUSUAL RASH Items with * indicate a potential emergency and should be followed up as soon as possible.  Feel free to call the clinic you have any questions or concerns. The clinic phone number is [phone].  Please show the CHEMO ALERT CARD at check-in to the Emergency Department and triage nurse.   

## 2015-07-08 NOTE — Telephone Encounter (Signed)
Talked to patient here in office. Scheduled appt.       [facility]. °

## 2015-07-08 NOTE — Telephone Encounter (Signed)
Per staff message and POF I have scheduled appts. Advised scheduler of appts. JMW  

## 2015-07-15 ENCOUNTER — Other Ambulatory Visit (HOSPITAL_BASED_OUTPATIENT_CLINIC_OR_DEPARTMENT_OTHER): Payer: Medicare Other

## 2015-07-15 ENCOUNTER — Ambulatory Visit (HOSPITAL_BASED_OUTPATIENT_CLINIC_OR_DEPARTMENT_OTHER): Payer: Medicare Other

## 2015-07-15 VITALS — BP 106/61 | HR 65 | Temp 97.7°F | Resp 18 | Ht 69.0 in

## 2015-07-15 DIAGNOSIS — Z5111 Encounter for antineoplastic chemotherapy: Secondary | ICD-10-CM

## 2015-07-15 DIAGNOSIS — C689 Malignant neoplasm of urinary organ, unspecified: Secondary | ICD-10-CM

## 2015-07-15 LAB — COMPREHENSIVE METABOLIC PANEL
ALBUMIN: 3.1 g/dL — AB (ref 3.5–5.0)
ALT: 57 U/L — AB (ref 0–55)
AST: 35 U/L — AB (ref 5–34)
Alkaline Phosphatase: 71 U/L (ref 40–150)
Anion Gap: 7 mEq/L (ref 3–11)
BUN: 28.9 mg/dL — AB (ref 7.0–26.0)
CALCIUM: 9.2 mg/dL (ref 8.4–10.4)
CHLORIDE: 101 meq/L (ref 98–109)
CO2: 27 mEq/L (ref 22–29)
CREATININE: 1.2 mg/dL (ref 0.7–1.3)
EGFR: 59 mL/min/{1.73_m2} — ABNORMAL LOW (ref >90)
GLUCOSE: 142 mg/dL — AB (ref 70–140)
Potassium: 4.1 mEq/L (ref 3.5–5.1)
SODIUM: 136 meq/L (ref 136–145)
Total Bilirubin: 0.33 mg/dL (ref 0.20–1.20)
Total Protein: 6 g/dL — ABNORMAL LOW (ref 6.4–8.3)

## 2015-07-15 LAB — CBC WITH DIFFERENTIAL/PLATELET
BASO%: 0.7 % (ref 0.0–2.0)
BASOS ABS: 0 10*3/uL (ref 0.0–0.1)
EOS%: 0.6 % (ref 0.0–7.0)
Eosinophils Absolute: 0 10*3/uL (ref 0.0–0.5)
HEMATOCRIT: 33 % — AB (ref 38.4–49.9)
HGB: 10.9 g/dL — ABNORMAL LOW (ref 13.0–17.1)
LYMPH%: 34.6 % (ref 14.0–49.0)
MCH: 30.5 pg (ref 27.2–33.4)
MCHC: 33.1 g/dL (ref 32.0–36.0)
MCV: 92.1 fL (ref 79.3–98.0)
MONO#: 0.3 10*3/uL (ref 0.1–0.9)
MONO%: 8.5 % (ref 0.0–14.0)
NEUT#: 2.2 10*3/uL (ref 1.5–6.5)
NEUT%: 55.6 % (ref 39.0–75.0)
Platelets: 284 10*3/uL (ref 140–400)
RBC: 3.58 10*6/uL — ABNORMAL LOW (ref 4.20–5.82)
RDW: 14.6 % (ref 11.0–14.6)
WBC: 3.9 10*3/uL — ABNORMAL LOW (ref 4.0–10.3)
lymph#: 1.3 10*3/uL (ref 0.9–3.3)

## 2015-07-15 LAB — TECHNOLOGIST REVIEW

## 2015-07-15 MED ORDER — SODIUM CHLORIDE 0.9 % IV SOLN
2000.0000 mg | Freq: Once | INTRAVENOUS | Status: AC
  Administered 2015-07-15: 2000 mg via INTRAVENOUS
  Filled 2015-07-15: qty 52.6

## 2015-07-15 MED ORDER — PROCHLORPERAZINE MALEATE 10 MG PO TABS
ORAL_TABLET | ORAL | Status: AC
  Filled 2015-07-15: qty 1

## 2015-07-15 MED ORDER — SODIUM CHLORIDE 0.9 % IV SOLN
Freq: Once | INTRAVENOUS | Status: AC
  Administered 2015-07-15: 10:00:00 via INTRAVENOUS

## 2015-07-15 MED ORDER — PROCHLORPERAZINE MALEATE 10 MG PO TABS
10.0000 mg | ORAL_TABLET | Freq: Once | ORAL | Status: AC
  Administered 2015-07-15: 10 mg via ORAL

## 2015-07-15 NOTE — Patient Instructions (Signed)
[facility] Discharge Instructions for Patients Receiving Chemotherapy  Today you received the following chemotherapy agents Gemzar.  To help prevent nausea and vomiting after your treatment, we encourage you to take your nausea medication.   If you develop nausea and vomiting that is not controlled by your nausea medication, call the clinic.   BELOW ARE SYMPTOMS THAT SHOULD BE REPORTED IMMEDIATELY:  *FEVER GREATER THAN 100.5 F  *CHILLS WITH OR WITHOUT FEVER  NAUSEA AND VOMITING THAT IS NOT CONTROLLED WITH YOUR NAUSEA MEDICATION  *UNUSUAL SHORTNESS OF BREATH  *UNUSUAL BRUISING OR BLEEDING  TENDERNESS IN MOUTH AND THROAT WITH OR WITHOUT PRESENCE OF ULCERS  *URINARY PROBLEMS  *BOWEL PROBLEMS  UNUSUAL RASH Items with * indicate a potential emergency and should be followed up as soon as possible.  Feel free to call the clinic you have any questions or concerns. The clinic phone number is [phone].  Please show the CHEMO ALERT CARD at check-in to the Emergency Department and triage nurse.   

## 2015-07-23 DIAGNOSIS — Z0000 Encounter for general adult medical examination without abnormal findings: Secondary | ICD-10-CM | POA: Diagnosis not present

## 2015-07-23 DIAGNOSIS — C642 Malignant neoplasm of left kidney, except renal pelvis: Secondary | ICD-10-CM | POA: Diagnosis not present

## 2015-07-24 ENCOUNTER — Encounter: Payer: Self-pay | Admitting: *Deleted

## 2015-07-25 ENCOUNTER — Ambulatory Visit (HOSPITAL_COMMUNITY)
Admission: RE | Admit: 2015-07-25 | Discharge: 2015-07-25 | Disposition: A | Discharge status: Home / Self Care | Payer: Medicare Other | Source: Ambulatory Visit | Attending: Oncology | Admitting: Oncology

## 2015-07-25 ENCOUNTER — Encounter (HOSPITAL_COMMUNITY): Payer: Self-pay

## 2015-07-25 DIAGNOSIS — R918 Other nonspecific abnormal finding of lung field: Secondary | ICD-10-CM | POA: Insufficient documentation

## 2015-07-25 DIAGNOSIS — C689 Malignant neoplasm of urinary organ, unspecified: Secondary | ICD-10-CM | POA: Diagnosis not present

## 2015-07-25 DIAGNOSIS — N2889 Other specified disorders of kidney and ureter: Secondary | ICD-10-CM | POA: Diagnosis not present

## 2015-07-25 DIAGNOSIS — Z8554 Personal history of malignant neoplasm of ureter: Secondary | ICD-10-CM | POA: Diagnosis not present

## 2015-07-25 HISTORY — DX: Malignant (primary) neoplasm, unspecified: C80.1

## 2015-07-25 MED ORDER — IOHEXOL 300 MG/ML  SOLN
100.0000 mL | Freq: Once | INTRAMUSCULAR | Status: AC | PRN
  Administered 2015-07-25: 100 mL via INTRAVENOUS

## 2015-07-29 ENCOUNTER — Ambulatory Visit (HOSPITAL_BASED_OUTPATIENT_CLINIC_OR_DEPARTMENT_OTHER): Payer: Medicare Other

## 2015-07-29 ENCOUNTER — Telehealth: Payer: Self-pay | Admitting: Oncology

## 2015-07-29 ENCOUNTER — Ambulatory Visit (HOSPITAL_BASED_OUTPATIENT_CLINIC_OR_DEPARTMENT_OTHER): Payer: Medicare Other | Admitting: Oncology

## 2015-07-29 ENCOUNTER — Other Ambulatory Visit (HOSPITAL_BASED_OUTPATIENT_CLINIC_OR_DEPARTMENT_OTHER): Payer: Medicare Other

## 2015-07-29 ENCOUNTER — Telehealth: Payer: Self-pay | Admitting: *Deleted

## 2015-07-29 VITALS — BP 125/63 | HR 71 | Temp 98.1°F | Resp 18 | Wt 178.5 lb

## 2015-07-29 DIAGNOSIS — R05 Cough: Secondary | ICD-10-CM | POA: Diagnosis not present

## 2015-07-29 DIAGNOSIS — C7800 Secondary malignant neoplasm of unspecified lung: Secondary | ICD-10-CM

## 2015-07-29 DIAGNOSIS — C689 Malignant neoplasm of urinary organ, unspecified: Secondary | ICD-10-CM

## 2015-07-29 DIAGNOSIS — Z5111 Encounter for antineoplastic chemotherapy: Secondary | ICD-10-CM

## 2015-07-29 LAB — CBC WITH DIFFERENTIAL/PLATELET
BASO%: 0.8 % (ref 0.0–2.0)
Basophils Absolute: 0 10*3/uL (ref 0.0–0.1)
EOS ABS: 0.1 10*3/uL (ref 0.0–0.5)
EOS%: 3 % (ref 0.0–7.0)
HEMATOCRIT: 31.7 % — AB (ref 38.4–49.9)
HEMOGLOBIN: 10.3 g/dL — AB (ref 13.0–17.1)
LYMPH#: 0.8 10*3/uL — AB (ref 0.9–3.3)
LYMPH%: 24.7 % (ref 14.0–49.0)
MCH: 30.9 pg (ref 27.2–33.4)
MCHC: 32.5 g/dL (ref 32.0–36.0)
MCV: 94.9 fL (ref 79.3–98.0)
MONO#: 0.7 10*3/uL (ref 0.1–0.9)
MONO%: 22.1 % — ABNORMAL HIGH (ref 0.0–14.0)
NEUT%: 49.4 % (ref 39.0–75.0)
NEUTROS ABS: 1.6 10*3/uL (ref 1.5–6.5)
PLATELETS: 134 10*3/uL — AB (ref 140–400)
RBC: 3.34 10*6/uL — ABNORMAL LOW (ref 4.20–5.82)
RDW: 17.5 % — AB (ref 11.0–14.6)
WBC: 3.2 10*3/uL — AB (ref 4.0–10.3)

## 2015-07-29 LAB — COMPREHENSIVE METABOLIC PANEL
ALBUMIN: 3.2 g/dL — AB (ref 3.5–5.0)
ALK PHOS: 74 U/L (ref 40–150)
ALT: 22 U/L (ref 0–55)
ANION GAP: 8 meq/L (ref 3–11)
AST: 19 U/L (ref 5–34)
BUN: 14.8 mg/dL (ref 7.0–26.0)
CALCIUM: 8.9 mg/dL (ref 8.4–10.4)
CO2: 25 meq/L (ref 22–29)
CREATININE: 1 mg/dL (ref 0.7–1.3)
Chloride: 106 mEq/L (ref 98–109)
EGFR: 78 mL/min/{1.73_m2} — AB (ref >90)
Glucose: 160 mg/dl — ABNORMAL HIGH (ref 70–140)
Potassium: 3.9 mEq/L (ref 3.5–5.1)
Sodium: 139 mEq/L (ref 136–145)
TOTAL PROTEIN: 5.9 g/dL — AB (ref 6.4–8.3)
Total Bilirubin: <0.3 mg/dL (ref 0.20–1.20)

## 2015-07-29 MED ORDER — SODIUM CHLORIDE 0.9 % IJ SOLN
10.0000 mL | INTRAMUSCULAR | Status: DC | PRN
  Administered 2015-07-29: 10 mL
  Filled 2015-07-29: qty 10

## 2015-07-29 MED ORDER — POTASSIUM CHLORIDE 2 MEQ/ML IV SOLN
Freq: Once | INTRAVENOUS | Status: AC
  Administered 2015-07-29: 10:00:00 via INTRAVENOUS
  Filled 2015-07-29: qty 10

## 2015-07-29 MED ORDER — SODIUM CHLORIDE 0.9 % IV SOLN
70.0000 mg/m2 | Freq: Once | INTRAVENOUS | Status: AC
  Administered 2015-07-29: 138 mg via INTRAVENOUS
  Filled 2015-07-29: qty 138

## 2015-07-29 MED ORDER — HEPARIN SOD (PORK) LOCK FLUSH 100 UNIT/ML IV SOLN
500.0000 [IU] | Freq: Once | INTRAVENOUS | Status: AC | PRN
  Administered 2015-07-29: 500 [IU]
  Filled 2015-07-29: qty 5

## 2015-07-29 MED ORDER — SODIUM CHLORIDE 0.9 % IV SOLN
2000.0000 mg | Freq: Once | INTRAVENOUS | Status: AC
  Administered 2015-07-29: 2000 mg via INTRAVENOUS
  Filled 2015-07-29: qty 52.6

## 2015-07-29 MED ORDER — PALONOSETRON HCL INJECTION 0.25 MG/5ML
INTRAVENOUS | Status: AC
  Filled 2015-07-29: qty 5

## 2015-07-29 MED ORDER — SODIUM CHLORIDE 0.9 % IV SOLN
Freq: Once | INTRAVENOUS | Status: AC
  Administered 2015-07-29: 10:00:00 via INTRAVENOUS

## 2015-07-29 MED ORDER — PALONOSETRON HCL INJECTION 0.25 MG/5ML
0.2500 mg | Freq: Once | INTRAVENOUS | Status: AC
  Administered 2015-07-29: 0.25 mg via INTRAVENOUS

## 2015-07-29 MED ORDER — SODIUM CHLORIDE 0.9 % IV SOLN
Freq: Once | INTRAVENOUS | Status: AC
  Administered 2015-07-29: 12:00:00 via INTRAVENOUS
  Filled 2015-07-29: qty 5

## 2015-07-29 NOTE — Telephone Encounter (Signed)
Per staff message and POF I have scheduled appts. Advised scheduler of appts. JMW  

## 2015-07-29 NOTE — Progress Notes (Signed)
Hematology and Oncology Follow Up Visit  [name] [name] [id number] 09-16-1945 70 y.o. 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 He presented with 4.9 cm mass of the upper pole of the left kidney with documented pulmonary metastasis. Neurological confirmation done on 04/15/2015 with a biopsy showed urothelial carcinoma.   Prior Therapy:   Status post biopsy of the left renal mass done on 04/15/2015. He is status post Port-A-Cath insertion on 05/21/2015.   Current therapy: Systemic chemotherapy in the form of cisplatin and gemcitabine started on 05/27/2015. Today is day 1 of cycle 4 of therapy.  Interim History:  Mr. [name] [name] presents today for a follow-up visit. Since the last visit, he reports no recent complaints. He continues to report improvement in his overall health and quality of life. He reports no respiratory symptoms at this time including no cough, dyspnea or hemoptysis. He has reported mild fatigue, nausea and weight loss.  He reports dexamethasone has helped his nausea but does report decrease in his appetite and some weight loss.   He does not report any peripheral neuropathy, hearing loss or decline in his performance status. He reports improvement in his cough as well as constitutional symptoms of chills or sweats since the start of chemotherapy. He is no longer reporting any hematuria or urinary symptoms.  He does not report any headaches, blurry vision, syncope or seizures. He does not report any fevers, chills, sweats with loss he does report some appetite changes. He does not report any chest pain, palpitation, orthopnea or leg edema. He does not report any wheezing or hemoptysis. He does report chronic cough. He does not report any chest pain, palpitation, orthopnea, leg edema. He does not report any  vomiting, abdominal pain does  report occasional dyspepsia. He does not report any frequency, urgency or hesitancy. He does not report any skeletal complaints. Remaining review of systems unremarkable.   Medications: I have reviewed the patient's current medications.  Current Outpatient Prescriptions  Medication Sig Dispense Refill  . acetaminophen (TYLENOL) 325 MG tablet Take 650 mg by mouth every 6 (six) hours as needed for moderate pain or headache.     [name] atorvastatin (LIPITOR) 40 MG tablet Take 1 tablet (40 mg total) by mouth daily. 90 tablet 3  . Cholecalciferol (VITAMIN D) 1000 UNITS capsule Take 1,000 Units by mouth daily.      [name] dexamethasone (DECADRON) 4 MG tablet Take one tablet twice a day for 4 days after each long chemotherapy. 40 tablet 1  . diphenhydrAMINE (SOMINEX) 25 MG tablet Take 25 mg by mouth at bedtime.    . docusate sodium (COLACE) 100 MG capsule Take 1 capsule (100 mg total) by mouth 2 (two) times daily. (Patient taking differently: Take 100 mg by mouth daily as needed for mild constipation. ) 10 capsule 0  . HYDROcodone-homatropine (HYCODAN) 5-1.5 MG/5ML syrup Take 5 mLs by mouth every 6 (six) hours as needed for cough. 120 mL 0  . lidocaine-prilocaine (EMLA) cream Apply to port-a-cath 1-2 hours prior to acces. Cover with saran wrap. 30 g 1  . lisinopril (PRINIVIL,ZESTRIL) 5 MG tablet Take 1 tablet (5 mg total) by mouth daily. (Patient taking differently: Take 5 mg by mouth every evening. ) 90 tablet 3  . metoprolol tartrate (LOPRESSOR) 25 MG tablet Take 0.5 tablets (12.5 mg total) by mouth 2 (two) times daily. 90 tablet 3  . nitroGLYCERIN (  NITROSTAT) 0.4 MG SL tablet Place 1 tablet (0.4 mg total) under the tongue every 5 (five) minutes as needed. May repeat up to 3 doses. 100 tablet 3  . omeprazole (PRILOSEC) 20 MG capsule Take 20 mg by mouth daily.    . ondansetron (ZOFRAN) 8 MG tablet Take 1 tablet (8 mg total) by mouth every 8 (eight) hours as needed for nausea or vomiting. 30 tablet 1  .  polycarbophil (FIBERCON) 625 MG tablet Take 625 mg by mouth daily.     . prochlorperazine (COMPAZINE) 10 MG tablet Take 1 tablet (10 mg total) by mouth every 6 (six) hours as needed for nausea or vomiting. 30 tablet 0  . senna-docusate (SENNA S) 8.6-50 MG tablet Take 1 tablet by mouth 2 (two) times daily. 60 tablet 0  . sildenafil (VIAGRA) 50 MG tablet Take 50 mg by mouth as needed for erectile dysfunction.      No current facility-administered medications for this visit.     Allergies:  Allergies  Allergen Reactions  . Hydrocodone Other (See Comments)    Became too sedated    Past Medical History, Surgical history, Social history, and Family History were reviewed and updated.   Physical Exam: Blood pressure 125/63, pulse 71, temperature 98.1 F (36.7 C), temperature source Oral, resp. rate 18, weight 178 lb 8 oz (80.967 kg), SpO2 100 %. ECOG: 0 General appearance: alert and cooperative not in any distress. Head: Normocephalic, without obvious abnormality no oral ulcers or lesions. Neck: no adenopathy Lymph nodes: Cervical, supraclavicular, and axillary nodes normal. Heart:regular rate and rhythm, S1, S2 normal, no murmur, click, rub or gallop Lung:chest clear, no wheezing, rales, normal symmetric air entry Abdomin: soft, non-tender, without masses or organomegaly no ascites. EXT:no erythema, induration, or nodules   Lab Results: Lab Results  Component Value Date   WBC 3.2* 07/29/2015   HGB 10.3* 07/29/2015   HCT 31.7* 07/29/2015   MCV 94.9 07/29/2015   PLT 134* 07/29/2015     Chemistry      Component Value Date/Time   NA 136 07/15/2015 0945   NA 136 05/21/2015 0840   K 4.1 07/15/2015 0945   K 4.1 05/21/2015 0840   CL 104 05/21/2015 0840   CO2 27 07/15/2015 0945   CO2 25 05/21/2015 0840   BUN 28.9* 07/15/2015 0945   BUN 18 05/21/2015 0840   CREATININE 1.2 07/15/2015 0945   CREATININE 1.04 05/21/2015 0840      Component Value Date/Time   CALCIUM 9.2 07/15/2015  0945   CALCIUM 9.4 05/21/2015 0840   ALKPHOS 71 07/15/2015 0945   ALKPHOS 82 03/05/2015 0831   AST 35* 07/15/2015 0945   AST 19 03/05/2015 0831   ALT 57* 07/15/2015 0945   ALT 17 03/05/2015 0831   BILITOT 0.33 07/15/2015 0945   BILITOT 0.8 03/05/2015 0831     EXAM: CT CHEST, ABDOMEN AND PELVIS WITHOUT CONTRAST  TECHNIQUE: Multidetector CT imaging of the chest, abdomen and pelvis was performed following the standard protocol without IV contrast.  COMPARISON: PET-CT 05/06/2015; chest CT 04/07/2015.  FINDINGS: CT CHEST  Mediastinum/Nodes: Right anterior chest wall Port-A-Cath is present with tip terminating in the superior vena cava. Visualized thyroid is unremarkable. The heart is normal in size. No pericardial effusion. Patient status post CABG procedure. Aorta and main pulmonary artery are normal in caliber.  Lungs/Pleura: Central airways are patent. Interval decrease in size of diffuse bilateral pulmonary nodules, the majority of which are cavitary. Reference nodule within the left lower  lobe measures 1.1 cm (image 48; series 6), previously 1.7 cm. Reference nodule within the right lower lobe measures 0.9 cm (image 30; series 6), previously 1.5 cm. No pleural effusion or pneumothorax. No new or enlarging pulmonary nodules are identified.  Musculoskeletal: Status post median sternotomy. No aggressive or acute appearing osseous lesions.  CT ABDOMEN AND PELVIS  Hepatobiliary: Liver is normal in size and contour. Unchanged 1.5 cm low-attenuation lesion near the hepatic dome (image 53; series 2), compatible with hepatic cyst. Gallbladder is unremarkable. No intrahepatic or extrahepatic biliary ductal dilatation.  Pancreas: Unremarkable  Spleen: Unremarkable  Adrenals/Urinary Tract: The adrenal glands are normal. Significant interval decrease in size and conspicuity of previously visualized masslike lesion within the superior pole of the left kidney.  This mass measures approximately 2.1 x 2.5 cm (image 65; series 2), previously 4.9 x 4.4 cm. There is residual perinephric fat stranding about the left kidney and left ureter. Double-J left-sided nephro ureteral stent is in place. Urinary bladder is unremarkable.  Stomach/Bowel: No abnormal bowel wall thickening or evidence for bowel obstruction. No free fluid or free intraperitoneal air. The appendix is normal. No evidence for bowel obstruction. Normal morphology to the stomach.  Vascular/Lymphatic: The abdominal aorta is normal in caliber with extensive peripheral calcified and noncalcified atherosclerotic plaque. No retroperitoneal adenopathy.  Other: Prostate unremarkable.  Musculoskeletal: Lumbar spine degenerative changes. No aggressive or acute appearing osseous lesions.  IMPRESSION: Interval decrease in size of bilateral diffuse predominately cavitary pulmonary nodules.  Interval decrease in size of left upper pole renal mass.  Impression and Plan:   70 year old gentleman with the following issues:  1. 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 He did have a pathological confirmation done on 04/15/2015 with the specimen showed urothelial carcinoma.  He is currently on gemcitabine and cisplatin started on 05/27/2015 and he is status post 3 cycles. CT scan on 07/25/2015 was reviewed today and showed excellent response to therapy.   The plan is to proceed with cycle 4 as planned today. I anticipate completing close to 5 cycles of therapy if he can able to tolerated and repeat imaging studies. He'll require maintenance therapy subsequently.  2. Nausea and GI complication prophylaxis: He will continue to use dexamethasone for delayed nausea and vomiting after cisplatin therapy. Prilosec helped his reflux  symptoms related to steroids.  3. IV access: Port-A-Cath in place without any complications. No bleeding or pain associated with it. He continues to use EMLA cream as needed.  4. Renal function prophylaxis: Instructions to avoid nephrotoxic drugs were reviewed with the patient. He is include nonsteroidal anti-inflammatories among others. Creatinine continues to around baseline. He does have a stent in place which will be changed by his urologist in March 2017.  5. Constipation: I have added the Senokot S to his bowel regimen to take twice a day. No issues with constipation noted.  6. Cough: Likely related to his pulmonary metastasis. This have resolved at this time.  7. Neutropenia: His ANC is adequate today and we'll proceed without any dose reduction on today.  8. Follow-up: Will be in one week for day 8 cycle 4 and he will be evaluated before day 1 of cycle 5 of therapy.   [facility], MD 1/31/20179:09 AM

## 2015-07-29 NOTE — Telephone Encounter (Signed)
Talked with and scheduled this patient’s appointment(s) while patient was here in our office.       [facility]. °

## 2015-07-29 NOTE — Patient Instructions (Signed)
[facility] Discharge Instructions for Patients Receiving Chemotherapy  Today you received the following chemotherapy agents Gemzar/Cisplatin To help prevent nausea and vomiting after your treatment, we encourage you to take your nausea medication as prescribed.   If you develop nausea and vomiting that is not controlled by your nausea medication, call the clinic.   BELOW ARE SYMPTOMS THAT SHOULD BE REPORTED IMMEDIATELY:  *FEVER GREATER THAN 100.5 F  *CHILLS WITH OR WITHOUT FEVER  NAUSEA AND VOMITING THAT IS NOT CONTROLLED WITH YOUR NAUSEA MEDICATION  *UNUSUAL SHORTNESS OF BREATH  *UNUSUAL BRUISING OR BLEEDING  TENDERNESS IN MOUTH AND THROAT WITH OR WITHOUT PRESENCE OF ULCERS  *URINARY PROBLEMS  *BOWEL PROBLEMS  UNUSUAL RASH Items with * indicate a potential emergency and should be followed up as soon as possible.  Feel free to call the clinic you have any questions or concerns. The clinic phone number is [phone].  Please show the CHEMO ALERT CARD at check-in to the Emergency Department and triage nurse.   

## 2015-08-05 ENCOUNTER — Other Ambulatory Visit (HOSPITAL_BASED_OUTPATIENT_CLINIC_OR_DEPARTMENT_OTHER): Payer: Medicare Other

## 2015-08-05 ENCOUNTER — Ambulatory Visit (HOSPITAL_BASED_OUTPATIENT_CLINIC_OR_DEPARTMENT_OTHER): Payer: Medicare Other

## 2015-08-05 VITALS — BP 109/65 | HR 71 | Temp 97.7°F | Resp 19

## 2015-08-05 DIAGNOSIS — C689 Malignant neoplasm of urinary organ, unspecified: Secondary | ICD-10-CM | POA: Diagnosis not present

## 2015-08-05 DIAGNOSIS — Z5111 Encounter for antineoplastic chemotherapy: Secondary | ICD-10-CM | POA: Diagnosis not present

## 2015-08-05 LAB — CBC WITH DIFFERENTIAL/PLATELET
BASO%: 0.2 % (ref 0.0–2.0)
BASOS ABS: 0 10*3/uL (ref 0.0–0.1)
EOS ABS: 0 10*3/uL (ref 0.0–0.5)
EOS%: 1.4 % (ref 0.0–7.0)
HEMATOCRIT: 31.7 % — AB (ref 38.4–49.9)
HEMOGLOBIN: 10.6 g/dL — AB (ref 13.0–17.1)
LYMPH#: 0.9 10*3/uL (ref 0.9–3.3)
LYMPH%: 32.1 % (ref 14.0–49.0)
MCH: 31.5 pg (ref 27.2–33.4)
MCHC: 33.3 g/dL (ref 32.0–36.0)
MCV: 94.8 fL (ref 79.3–98.0)
MONO#: 0.1 10*3/uL (ref 0.1–0.9)
MONO%: 3 % (ref 0.0–14.0)
NEUT%: 63.3 % (ref 39.0–75.0)
NEUTROS ABS: 1.8 10*3/uL (ref 1.5–6.5)
Platelets: 146 10*3/uL (ref 140–400)
RBC: 3.35 10*6/uL — ABNORMAL LOW (ref 4.20–5.82)
RDW: 17.6 % — AB (ref 11.0–14.6)
WBC: 2.9 10*3/uL — ABNORMAL LOW (ref 4.0–10.3)

## 2015-08-05 LAB — COMPREHENSIVE METABOLIC PANEL
ALBUMIN: 3.2 g/dL — AB (ref 3.5–5.0)
ALK PHOS: 81 U/L (ref 40–150)
ALT: 43 U/L (ref 0–55)
AST: 29 U/L (ref 5–34)
Anion Gap: 10 mEq/L (ref 3–11)
BILIRUBIN TOTAL: 0.4 mg/dL (ref 0.20–1.20)
BUN: 24.3 mg/dL (ref 7.0–26.0)
CALCIUM: 9.2 mg/dL (ref 8.4–10.4)
CO2: 27 mEq/L (ref 22–29)
CREATININE: 1.2 mg/dL (ref 0.7–1.3)
Chloride: 100 mEq/L (ref 98–109)
EGFR: 61 mL/min/{1.73_m2} — ABNORMAL LOW (ref >90)
Glucose: 196 mg/dl — ABNORMAL HIGH (ref 70–140)
POTASSIUM: 4 meq/L (ref 3.5–5.1)
Sodium: 136 mEq/L (ref 136–145)
TOTAL PROTEIN: 5.8 g/dL — AB (ref 6.4–8.3)

## 2015-08-05 MED ORDER — PROCHLORPERAZINE MALEATE 10 MG PO TABS
ORAL_TABLET | ORAL | Status: AC
  Filled 2015-08-05: qty 1

## 2015-08-05 MED ORDER — HEPARIN SOD (PORK) LOCK FLUSH 100 UNIT/ML IV SOLN
500.0000 [IU] | Freq: Once | INTRAVENOUS | Status: AC | PRN
  Administered 2015-08-05: 500 [IU]
  Filled 2015-08-05: qty 5

## 2015-08-05 MED ORDER — SODIUM CHLORIDE 0.9 % IV SOLN
Freq: Once | INTRAVENOUS | Status: AC
  Administered 2015-08-05: 10:00:00 via INTRAVENOUS

## 2015-08-05 MED ORDER — PROCHLORPERAZINE MALEATE 10 MG PO TABS
10.0000 mg | ORAL_TABLET | Freq: Once | ORAL | Status: AC
  Administered 2015-08-05: 10 mg via ORAL

## 2015-08-05 MED ORDER — SODIUM CHLORIDE 0.9 % IV SOLN
2000.0000 mg | Freq: Once | INTRAVENOUS | Status: AC
  Administered 2015-08-05: 2000 mg via INTRAVENOUS
  Filled 2015-08-05: qty 52.6

## 2015-08-05 MED ORDER — SODIUM CHLORIDE 0.9 % IJ SOLN
10.0000 mL | INTRAMUSCULAR | Status: DC | PRN
  Administered 2015-08-05: 10 mL
  Filled 2015-08-05: qty 10

## 2015-08-05 NOTE — Patient Instructions (Signed)
[facility] Discharge Instructions for Patients Receiving Chemotherapy  Today you received the following chemotherapy agents Gemzar  To help prevent nausea and vomiting after your treatment, we encourage you to take your nausea medication as directed.    If you develop nausea and vomiting that is not controlled by your nausea medication, call the clinic.   BELOW ARE SYMPTOMS THAT SHOULD BE REPORTED IMMEDIATELY:  *FEVER GREATER THAN 100.5 F  *CHILLS WITH OR WITHOUT FEVER  NAUSEA AND VOMITING THAT IS NOT CONTROLLED WITH YOUR NAUSEA MEDICATION  *UNUSUAL SHORTNESS OF BREATH  *UNUSUAL BRUISING OR BLEEDING  TENDERNESS IN MOUTH AND THROAT WITH OR WITHOUT PRESENCE OF ULCERS  *URINARY PROBLEMS  *BOWEL PROBLEMS  UNUSUAL RASH Items with * indicate a potential emergency and should be followed up as soon as possible.  Feel free to call the clinic you have any questions or concerns. The clinic phone number is [phone].  Please show the CHEMO ALERT CARD at check-in to the Emergency Department and triage nurse.   

## 2015-08-05 NOTE — Progress Notes (Signed)
Pt presents to infusion room for Gemzar infusion.  Upon assessment, pt reports numbness and tingling to bilateral fingers starting 2-3 days ago.  Pt reports it does not affect his ability to use his fingers.  He does report he noticed some numbness to his toes "a couple days ago" but that has resolved.  No other complaints or concerns at this time.  Dr. [name], MD notified and given okay to proceed with today's scheduled Gemzar treatment.  Pt and wife informed to let us know if neuropathy worsens or changes prior to return follow up visit.

## 2015-08-07 ENCOUNTER — Telehealth: Payer: Self-pay | Admitting: *Deleted

## 2015-08-07 DIAGNOSIS — C689 Malignant neoplasm of urinary organ, unspecified: Secondary | ICD-10-CM

## 2015-08-07 DIAGNOSIS — N2889 Other specified disorders of kidney and ureter: Secondary | ICD-10-CM

## 2015-08-07 MED ORDER — ONDANSETRON HCL 8 MG PO TABS: 8.0000 mg | ORAL_TABLET | Freq: Three times a day (TID) | ORAL | Status: DC | PRN

## 2015-08-07 NOTE — Telephone Encounter (Signed)
"  My husband is out of zofran refills." This nurse will send refill to [facility] and added this pharmacy to preferred pharmacy list.  Advised for refills to call Pharmacy.

## 2015-08-19 ENCOUNTER — Telehealth: Payer: Self-pay | Admitting: Oncology

## 2015-08-19 ENCOUNTER — Ambulatory Visit: Payer: Medicare Other

## 2015-08-19 ENCOUNTER — Other Ambulatory Visit (HOSPITAL_BASED_OUTPATIENT_CLINIC_OR_DEPARTMENT_OTHER): Payer: Medicare Other

## 2015-08-19 ENCOUNTER — Telehealth: Payer: Self-pay | Admitting: *Deleted

## 2015-08-19 ENCOUNTER — Ambulatory Visit (HOSPITAL_BASED_OUTPATIENT_CLINIC_OR_DEPARTMENT_OTHER): Payer: Medicare Other | Admitting: Oncology

## 2015-08-19 VITALS — BP 114/60 | HR 73 | Temp 97.9°F | Resp 18 | Ht 69.0 in | Wt 175.4 lb

## 2015-08-19 DIAGNOSIS — C689 Malignant neoplasm of urinary organ, unspecified: Secondary | ICD-10-CM | POA: Diagnosis not present

## 2015-08-19 DIAGNOSIS — K5900 Constipation, unspecified: Secondary | ICD-10-CM

## 2015-08-19 DIAGNOSIS — D709 Neutropenia, unspecified: Secondary | ICD-10-CM

## 2015-08-19 DIAGNOSIS — R05 Cough: Secondary | ICD-10-CM

## 2015-08-19 DIAGNOSIS — R110 Nausea: Secondary | ICD-10-CM

## 2015-08-19 DIAGNOSIS — N2889 Other specified disorders of kidney and ureter: Secondary | ICD-10-CM

## 2015-08-19 DIAGNOSIS — C7800 Secondary malignant neoplasm of unspecified lung: Secondary | ICD-10-CM

## 2015-08-19 LAB — COMPREHENSIVE METABOLIC PANEL
ALBUMIN: 3.2 g/dL — AB (ref 3.5–5.0)
ALK PHOS: 78 U/L (ref 40–150)
ALT: 21 U/L (ref 0–55)
ANION GAP: 8 meq/L (ref 3–11)
AST: 23 U/L (ref 5–34)
BUN: 18.1 mg/dL (ref 7.0–26.0)
CO2: 26 meq/L (ref 22–29)
Calcium: 8.9 mg/dL (ref 8.4–10.4)
Chloride: 105 mEq/L (ref 98–109)
Creatinine: 1.1 mg/dL (ref 0.7–1.3)
EGFR: 64 mL/min/{1.73_m2} — AB (ref >90)
GLUCOSE: 160 mg/dL — AB (ref 70–140)
POTASSIUM: 4.2 meq/L (ref 3.5–5.1)
SODIUM: 139 meq/L (ref 136–145)
TOTAL PROTEIN: 5.7 g/dL — AB (ref 6.4–8.3)

## 2015-08-19 LAB — CBC WITH DIFFERENTIAL/PLATELET
BASO%: 1.5 % (ref 0.0–2.0)
BASOS ABS: 0 10*3/uL (ref 0.0–0.1)
EOS ABS: 0.1 10*3/uL (ref 0.0–0.5)
EOS%: 3 % (ref 0.0–7.0)
HCT: 27.7 % — ABNORMAL LOW (ref 38.4–49.9)
HGB: 9 g/dL — ABNORMAL LOW (ref 13.0–17.1)
LYMPH%: 47.5 % (ref 14.0–49.0)
MCH: 31.9 pg (ref 27.2–33.4)
MCHC: 32.5 g/dL (ref 32.0–36.0)
MCV: 98.2 fL — AB (ref 79.3–98.0)
MONO#: 0.4 10*3/uL (ref 0.1–0.9)
MONO%: 21.8 % — AB (ref 0.0–14.0)
NEUT#: 0.5 10*3/uL — CL (ref 1.5–6.5)
NEUT%: 26.2 % — AB (ref 39.0–75.0)
NRBC: 0 % (ref 0–0)
Platelets: 173 10*3/uL (ref 140–400)
RBC: 2.82 10*6/uL — ABNORMAL LOW (ref 4.20–5.82)
RDW: 17.2 % — ABNORMAL HIGH (ref 11.0–14.6)
WBC: 2 10*3/uL — ABNORMAL LOW (ref 4.0–10.3)
lymph#: 1 10*3/uL (ref 0.9–3.3)

## 2015-08-19 LAB — MAGNESIUM: MAGNESIUM: 1.9 mg/dL (ref 1.5–2.5)

## 2015-08-19 MED ORDER — ONDANSETRON HCL 8 MG PO TABS: 8.0000 mg | ORAL_TABLET | Freq: Three times a day (TID) | ORAL | Status: DC | PRN

## 2015-08-19 NOTE — Telephone Encounter (Signed)
Pt confirmed labs/ov per 02/21 POF, gave pt AVS and Calendar... KJ, sent msg to add chemo and add hours to next chemo

## 2015-08-19 NOTE — Telephone Encounter (Signed)
Per staff message and POF I have scheduled appts. Advised scheduler of appts. JMW  

## 2015-08-19 NOTE — Progress Notes (Signed)
Hematology and Oncology Follow Up Visit  [name] [name] [id number] 1946/05/21 70 y.o. 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 He presented with 4.9 cm mass of the upper pole of the left kidney with documented pulmonary metastasis. Neurological confirmation done on 04/15/2015 with a biopsy showed urothelial carcinoma.   Prior Therapy:   Status post biopsy of the left renal mass done on 04/15/2015. He is status post Port-A-Cath insertion on 05/21/2015.   Current therapy: Systemic chemotherapy in the form of cisplatin and gemcitabine started on 05/27/2015. Today is day 1 of cycle 5 of therapy.  Interim History:  Mr. [name] [name] presents today for a follow-up visit. Since the last visit, he reports slight increase in his delayed nausea. He has reported he needed to use Zofran after day 8 of chemotherapy for a few days which is new to him. He was able to make a trip in his week off and able to enjoy it.   He continues to report improvement in his overall health and quality of life. He reports no respiratory symptoms at this time including no cough, dyspnea or hemoptysis. He has reported mild fatigue, nausea and weight loss.  He reports dexamethasone has helped his nausea.  He does not report any peripheral neuropathy, hearing loss or decline in his performance status. He does not report any other neurological deficits.  He does not report any headaches, blurry vision, syncope or seizures. He does not report any fevers, chills, sweats with loss he does report some appetite changes. He does not report any chest pain, palpitation, orthopnea or leg edema. He does not report any wheezing or hemoptysis. He does report chronic cough. He does not report any chest pain, palpitation, orthopnea, leg edema. He does not report any  vomiting, abdominal pain does report  occasional dyspepsia. He does not report any frequency, urgency or hesitancy. He does not report any skeletal complaints. Remaining review of systems unremarkable.   Medications: I have reviewed the patient's current medications.  Current Outpatient Prescriptions  Medication Sig Dispense Refill  . acetaminophen (TYLENOL) 325 MG tablet Take 650 mg by mouth every 6 (six) hours as needed for moderate pain or headache.     [name] atorvastatin (LIPITOR) 40 MG tablet Take 1 tablet (40 mg total) by mouth daily. 90 tablet 3  . Cholecalciferol (VITAMIN D) 1000 UNITS capsule Take 1,000 Units by mouth daily.      [name] dexamethasone (DECADRON) 4 MG tablet Take one tablet twice a day for 4 days after each long chemotherapy. 40 tablet 1  . diphenhydrAMINE (SOMINEX) 25 MG tablet Take 25 mg by mouth at bedtime.    . docusate sodium (COLACE) 100 MG capsule Take 1 capsule (100 mg total) by mouth 2 (two) times daily. (Patient taking differently: Take 100 mg by mouth daily as needed for mild constipation. ) 10 capsule 0  . HYDROcodone-homatropine (HYCODAN) 5-1.5 MG/5ML syrup Take 5 mLs by mouth every 6 (six) hours as needed for cough. 120 mL 0  . lidocaine-prilocaine (EMLA) cream Apply to port-a-cath 1-2 hours prior to acces. Cover with saran wrap. 30 g 1  . lisinopril (PRINIVIL,ZESTRIL) 5 MG tablet Take 1 tablet (5 mg total) by mouth daily. (Patient taking differently: Take 5 mg by mouth every evening. ) 90 tablet 3  . metoprolol tartrate (LOPRESSOR) 25 MG tablet Take 0.5 tablets (12.5 mg total) by mouth 2 (  two) times daily. 90 tablet 3  . nitroGLYCERIN (NITROSTAT) 0.4 MG SL tablet Place 1 tablet (0.4 mg total) under the tongue every 5 (five) minutes as needed. May repeat up to 3 doses. 100 tablet 3  . omeprazole (PRILOSEC) 20 MG capsule Take 20 mg by mouth daily.    . ondansetron (ZOFRAN) 8 MG tablet Take 1 tablet (8 mg total) by mouth every 8 (eight) hours as needed for nausea or vomiting. 30 tablet 1  . polycarbophil  (FIBERCON) 625 MG tablet Take 625 mg by mouth daily.     . prochlorperazine (COMPAZINE) 10 MG tablet Take 1 tablet (10 mg total) by mouth every 6 (six) hours as needed for nausea or vomiting. 30 tablet 0  . senna-docusate (SENNA S) 8.6-50 MG tablet Take 1 tablet by mouth 2 (two) times daily. 60 tablet 0  . sildenafil (VIAGRA) 50 MG tablet Take 50 mg by mouth as needed for erectile dysfunction.      No current facility-administered medications for this visit.     Allergies:  Allergies  Allergen Reactions  . Hydrocodone Other (See Comments)    Became too sedated    Past Medical History, Surgical history, Social history, and Family History were reviewed and updated.   Physical Exam: Blood pressure 114/60, pulse 73, temperature 97.9 F (36.6 C), temperature source Oral, resp. rate 18, height 5\' 9"  (1.753 m), weight 175 lb 6.4 oz (79.561 kg), SpO2 99 %. ECOG: 0 General appearance: alert and cooperative well appearing. Head: Normocephalic, without obvious abnormality no oral thrush. Neck: no adenopathy Lymph nodes: Cervical, supraclavicular, and axillary nodes normal. Heart:regular rate and rhythm, S1, S2 normal, no murmur, click, rub or gallop Lung:chest clear, no wheezing, rales, normal symmetric air entry Abdomin: soft, non-tender, without masses or organomegaly no ascites. EXT:no erythema, induration, or nodules   Lab Results: Lab Results  Component Value Date   WBC 2.0* 08/19/2015   HGB 9.0* 08/19/2015   HCT 27.7* 08/19/2015   MCV 98.2* 08/19/2015   PLT 173 08/19/2015     Chemistry      Component Value Date/Time   NA 136 08/05/2015 0835   NA 136 05/21/2015 0840   K 4.0 08/05/2015 0835   K 4.1 05/21/2015 0840   CL 104 05/21/2015 0840   CO2 27 08/05/2015 0835   CO2 25 05/21/2015 0840   BUN 24.3 08/05/2015 0835   BUN 18 05/21/2015 0840   CREATININE 1.2 08/05/2015 0835   CREATININE 1.04 05/21/2015 0840      Component Value Date/Time   CALCIUM 9.2 08/05/2015 0835    CALCIUM 9.4 05/21/2015 0840   ALKPHOS 81 08/05/2015 0835   ALKPHOS 82 03/05/2015 0831   AST 29 08/05/2015 0835   AST 19 03/05/2015 0831   ALT 43 08/05/2015 0835   ALT 17 03/05/2015 0831   BILITOT 0.40 08/05/2015 0835   BILITOT 0.8 03/05/2015 0831     l mass.  Impression and Plan:   70 year old gentleman with the following issues:  1. 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 He did have a pathological confirmation done on 04/15/2015 with the specimen showed urothelial carcinoma.  He is currently on gemcitabine and cisplatin started on 05/27/2015 and he is status post 3 cycles. CT scan on 07/25/2015 was reviewed showed excellent response to therapy.   The plan is  to proceed with cycle with cycle 5 of chemotherapy after 1 week delay due to 08/26/2015 because of neutropenia. I will add growth factor support to have cycle and will eliminate day 8 moving forward. He'll also receive cycle 6 with growth factor support and without day 8.  After completing 6 cycles of therapy, he will receive treatment holiday and repeat imaging studies after that.  2. Nausea and GI complication prophylaxis: He will continue to use dexamethasone for delayed nausea and vomiting after cisplatin therapy. Zofran was refilled today. He did report some constipation as well and I have urged him to use Senokot S and laxative as needed.  3. IV access: Port-A-Cath in place without any complications. No bleeding or pain associated with it. He continues to use EMLA cream as needed.  4. Renal function prophylaxis: Instructions to avoid nephrotoxic drugs were reviewed with the patient. He is include nonsteroidal anti-inflammatories among others. Creatinine is pending from today but has been within normal range.  5. Constipation: I have added the Senokot S  to his bowel regimen to take twice a day. I continue to urge him to do so especially with Zofran.  6. Cough: Likely related to his pulmonary metastasis. His respiratory status is stable.  7. Neutropenia: His ANC is low and will not receive chemotherapy. Neutropenia precautions was given to the patient today and he will receive growth factor support with cycle 5 and cycle 6 of therapy.  8. Follow-up: Will be in one week for the start of cycle 5 which will be delayed because of neutropenia.   [facility], MD 2/21/20179:00 AM

## 2015-08-25 ENCOUNTER — Other Ambulatory Visit: Payer: Self-pay | Admitting: *Deleted

## 2015-08-26 ENCOUNTER — Other Ambulatory Visit (HOSPITAL_BASED_OUTPATIENT_CLINIC_OR_DEPARTMENT_OTHER): Payer: Medicare Other

## 2015-08-26 ENCOUNTER — Ambulatory Visit (HOSPITAL_BASED_OUTPATIENT_CLINIC_OR_DEPARTMENT_OTHER): Payer: Medicare Other

## 2015-08-26 VITALS — BP 116/64 | HR 63 | Temp 98.0°F | Resp 18

## 2015-08-26 DIAGNOSIS — C689 Malignant neoplasm of urinary organ, unspecified: Secondary | ICD-10-CM

## 2015-08-26 DIAGNOSIS — Z5111 Encounter for antineoplastic chemotherapy: Secondary | ICD-10-CM

## 2015-08-26 DIAGNOSIS — C7800 Secondary malignant neoplasm of unspecified lung: Secondary | ICD-10-CM

## 2015-08-26 LAB — COMPREHENSIVE METABOLIC PANEL
ALT: 17 U/L (ref 0–55)
ANION GAP: 9 meq/L (ref 3–11)
AST: 19 U/L (ref 5–34)
Albumin: 3.3 g/dL — ABNORMAL LOW (ref 3.5–5.0)
Alkaline Phosphatase: 84 U/L (ref 40–150)
BUN: 15.9 mg/dL (ref 7.0–26.0)
CHLORIDE: 105 meq/L (ref 98–109)
CO2: 26 meq/L (ref 22–29)
CREATININE: 1 mg/dL (ref 0.7–1.3)
Calcium: 8.8 mg/dL (ref 8.4–10.4)
EGFR: 72 mL/min/{1.73_m2} — ABNORMAL LOW (ref >90)
GLUCOSE: 173 mg/dL — AB (ref 70–140)
Potassium: 3.9 mEq/L (ref 3.5–5.1)
SODIUM: 139 meq/L (ref 136–145)
TOTAL PROTEIN: 5.7 g/dL — AB (ref 6.4–8.3)

## 2015-08-26 LAB — CBC WITH DIFFERENTIAL/PLATELET
BASO%: 0.9 % (ref 0.0–2.0)
Basophils Absolute: 0 10*3/uL (ref 0.0–0.1)
EOS%: 1.3 % (ref 0.0–7.0)
Eosinophils Absolute: 0 10*3/uL (ref 0.0–0.5)
HCT: 30.5 % — ABNORMAL LOW (ref 38.4–49.9)
HGB: 10.1 g/dL — ABNORMAL LOW (ref 13.0–17.1)
LYMPH%: 24.6 % (ref 14.0–49.0)
MCH: 32.5 pg (ref 27.2–33.4)
MCHC: 33 g/dL (ref 32.0–36.0)
MCV: 98.3 fL — ABNORMAL HIGH (ref 79.3–98.0)
MONO#: 1.1 10*3/uL — AB (ref 0.1–0.9)
MONO%: 30.6 % — AB (ref 0.0–14.0)
NEUT%: 42.6 % (ref 39.0–75.0)
NEUTROS ABS: 1.6 10*3/uL (ref 1.5–6.5)
PLATELETS: 243 10*3/uL (ref 140–400)
RBC: 3.1 10*6/uL — AB (ref 4.20–5.82)
RDW: 20 % — ABNORMAL HIGH (ref 11.0–14.6)
WBC: 3.7 10*3/uL — AB (ref 4.0–10.3)
lymph#: 0.9 10*3/uL (ref 0.9–3.3)

## 2015-08-26 MED ORDER — SODIUM CHLORIDE 0.9 % IV SOLN
Freq: Once | INTRAVENOUS | Status: AC
  Administered 2015-08-26: 13:00:00 via INTRAVENOUS
  Filled 2015-08-26: qty 5

## 2015-08-26 MED ORDER — PALONOSETRON HCL INJECTION 0.25 MG/5ML
INTRAVENOUS | Status: AC
  Filled 2015-08-26: qty 5

## 2015-08-26 MED ORDER — HEPARIN SOD (PORK) LOCK FLUSH 100 UNIT/ML IV SOLN
500.0000 [IU] | Freq: Once | INTRAVENOUS | Status: AC | PRN
  Administered 2015-08-26: 500 [IU]
  Filled 2015-08-26: qty 5

## 2015-08-26 MED ORDER — POTASSIUM CHLORIDE 2 MEQ/ML IV SOLN
Freq: Once | INTRAVENOUS | Status: AC
  Administered 2015-08-26: 10:00:00 via INTRAVENOUS
  Filled 2015-08-26: qty 10

## 2015-08-26 MED ORDER — CISPLATIN CHEMO INJECTION 100MG/100ML
70.0000 mg/m2 | Freq: Once | INTRAVENOUS | Status: AC
  Administered 2015-08-26: 138 mg via INTRAVENOUS
  Filled 2015-08-26: qty 138

## 2015-08-26 MED ORDER — SODIUM CHLORIDE 0.9 % IJ SOLN
10.0000 mL | INTRAMUSCULAR | Status: DC | PRN
  Administered 2015-08-26: 10 mL
  Filled 2015-08-26: qty 10

## 2015-08-26 MED ORDER — SODIUM CHLORIDE 0.9 % IV SOLN
Freq: Once | INTRAVENOUS | Status: AC
  Administered 2015-08-26: 10:00:00 via INTRAVENOUS

## 2015-08-26 MED ORDER — PALONOSETRON HCL INJECTION 0.25 MG/5ML
0.2500 mg | Freq: Once | INTRAVENOUS | Status: AC
  Administered 2015-08-26: 0.25 mg via INTRAVENOUS

## 2015-08-26 MED ORDER — SODIUM CHLORIDE 0.9 % IV SOLN
2000.0000 mg | Freq: Once | INTRAVENOUS | Status: AC
  Administered 2015-08-26: 2000 mg via INTRAVENOUS
  Filled 2015-08-26: qty 52.6

## 2015-08-26 NOTE — Patient Instructions (Signed)
[facility] Discharge Instructions for Patients Receiving Chemotherapy  Today you received the following chemotherapy agents Gemzar/Cisplatin To help prevent nausea and vomiting after your treatment, we encourage you to take your nausea medication as prescribed.   If you develop nausea and vomiting that is not controlled by your nausea medication, call the clinic.   BELOW ARE SYMPTOMS THAT SHOULD BE REPORTED IMMEDIATELY:  *FEVER GREATER THAN 100.5 F  *CHILLS WITH OR WITHOUT FEVER  NAUSEA AND VOMITING THAT IS NOT CONTROLLED WITH YOUR NAUSEA MEDICATION  *UNUSUAL SHORTNESS OF BREATH  *UNUSUAL BRUISING OR BLEEDING  TENDERNESS IN MOUTH AND THROAT WITH OR WITHOUT PRESENCE OF ULCERS  *URINARY PROBLEMS  *BOWEL PROBLEMS  UNUSUAL RASH Items with * indicate a potential emergency and should be followed up as soon as possible.  Feel free to call the clinic you have any questions or concerns. The clinic phone number is [phone].  Please show the CHEMO ALERT CARD at check-in to the Emergency Department and triage nurse.   

## 2015-08-28 ENCOUNTER — Ambulatory Visit (HOSPITAL_BASED_OUTPATIENT_CLINIC_OR_DEPARTMENT_OTHER): Payer: Medicare Other

## 2015-08-28 VITALS — BP 136/69 | HR 73 | Temp 97.6°F

## 2015-08-28 DIAGNOSIS — C689 Malignant neoplasm of urinary organ, unspecified: Secondary | ICD-10-CM

## 2015-08-28 DIAGNOSIS — Z5189 Encounter for other specified aftercare: Secondary | ICD-10-CM

## 2015-08-28 MED ORDER — PEGFILGRASTIM INJECTION 6 MG/0.6ML ~~LOC~~
6.0000 mg | PREFILLED_SYRINGE | Freq: Once | SUBCUTANEOUS | Status: AC
  Administered 2015-08-28: 6 mg via SUBCUTANEOUS
  Filled 2015-08-28: qty 0.6

## 2015-08-28 NOTE — Patient Instructions (Signed)
Pegfilgrastim injection What is this medicine? PEGFILGRASTIM (PEG fil gra stim) is a long-acting granulocyte colony-stimulating factor that stimulates the growth of neutrophils, a type of white blood cell important in the body's fight against infection. It is used to reduce the incidence of fever and infection in patients with certain types of cancer who are receiving chemotherapy that affects the bone marrow, and to increase survival after being exposed to high doses of radiation. This medicine may be used for other purposes; ask your health care provider or pharmacist if you have questions. What should I tell my health care provider before I take this medicine? They need to know if you have any of these conditions: -kidney disease -latex allergy -ongoing radiation therapy -sickle cell disease -skin reactions to acrylic adhesives (On-Body Injector only) -an unusual or allergic reaction to pegfilgrastim, filgrastim, other medicines, foods, dyes, or preservatives -pregnant or trying to get pregnant -breast-feeding How should I use this medicine? This medicine is for injection under the skin. If you get this medicine at home, you will be taught how to prepare and give the pre-filled syringe or how to use the On-body Injector. Refer to the patient Instructions for Use for detailed instructions. Use exactly as directed. Take your medicine at regular intervals. Do not take your medicine more often than directed. It is important that you put your used needles and syringes in a special sharps container. Do not put them in a trash can. If you do not have a sharps container, call your pharmacist or healthcare provider to get one. Talk to your pediatrician regarding the use of this medicine in children. While this drug may be prescribed for selected conditions, precautions do apply. Overdosage: If you think you have taken too much of this medicine contact a poison control center or emergency room at  once. NOTE: This medicine is only for you. Do not share this medicine with others. What if I miss a dose? It is important not to miss your dose. Call your doctor or health care professional if you miss your dose. If you miss a dose due to an On-body Injector failure or leakage, a new dose should be administered as soon as possible using a single prefilled syringe for manual use. What may interact with this medicine? Interactions have not been studied. Give your health care provider a list of all the medicines, herbs, non-prescription drugs, or dietary supplements you use. Also tell them if you smoke, drink alcohol, or use illegal drugs. Some items may interact with your medicine. This list may not describe all possible interactions. Give your health care provider a list of all the medicines, herbs, non-prescription drugs, or dietary supplements you use. Also tell them if you smoke, drink alcohol, or use illegal drugs. Some items may interact with your medicine. What should I watch for while using this medicine? You may need blood work done while you are taking this medicine. If you are going to need a MRI, CT scan, or other procedure, tell your doctor that you are using this medicine (On-Body Injector only). What side effects may I notice from receiving this medicine? Side effects that you should report to your doctor or health care professional as soon as possible: -allergic reactions like skin rash, itching or hives, swelling of the face, lips, or tongue -dizziness -fever -pain, redness, or irritation at site where injected -pinpoint red spots on the skin -red or dark-brown urine -shortness of breath or breathing problems -stomach or side pain, or pain   at the shoulder -swelling -tiredness -trouble passing urine or change in the amount of urine Side effects that usually do not require medical attention (report to your doctor or health care professional if they continue or are  bothersome): -bone pain -muscle pain This list may not describe all possible side effects. Call your doctor for medical advice about side effects. You may report side effects to FDA at 1-800-FDA-1088. Where should I keep my medicine? Keep out of the reach of children. Store pre-filled syringes in a refrigerator between 2 and 8 degrees C (36 and 46 degrees F). Do not freeze. Keep in carton to protect from light. Throw away this medicine if it is left out of the refrigerator for more than 48 hours. Throw away any unused medicine after the expiration date. NOTE: This sheet is a summary. It may not cover all possible information. If you have questions about this medicine, talk to your doctor, pharmacist, or health care provider.    2016, [employer]. (2014-07-04 14:30:14)  

## 2015-09-01 ENCOUNTER — Telehealth: Payer: Self-pay | Admitting: *Deleted

## 2015-09-01 NOTE — Telephone Encounter (Signed)
"  Is it okay to take the compazine and zofran at the same time or close together?  I took zofran early this morning and still nauseated.  No vomiting.  I am able to drink and eat small amounts." No harm taking these medicines close together or at the same time if the eight hour and six hours catch and fall at same time.  No further questions.

## 2015-09-16 ENCOUNTER — Other Ambulatory Visit (HOSPITAL_BASED_OUTPATIENT_CLINIC_OR_DEPARTMENT_OTHER): Payer: Medicare Other

## 2015-09-16 ENCOUNTER — Other Ambulatory Visit: Payer: Self-pay | Admitting: Pharmacist

## 2015-09-16 ENCOUNTER — Ambulatory Visit (HOSPITAL_BASED_OUTPATIENT_CLINIC_OR_DEPARTMENT_OTHER): Payer: Medicare Other | Admitting: Oncology

## 2015-09-16 ENCOUNTER — Telehealth: Payer: Self-pay | Admitting: Oncology

## 2015-09-16 ENCOUNTER — Ambulatory Visit (HOSPITAL_BASED_OUTPATIENT_CLINIC_OR_DEPARTMENT_OTHER): Payer: Medicare Other

## 2015-09-16 VITALS — BP 123/79 | HR 69 | Resp 17

## 2015-09-16 VITALS — BP 120/68 | HR 71 | Temp 97.7°F | Resp 18 | Ht 69.0 in | Wt 180.7 lb

## 2015-09-16 DIAGNOSIS — C689 Malignant neoplasm of urinary organ, unspecified: Secondary | ICD-10-CM | POA: Diagnosis not present

## 2015-09-16 DIAGNOSIS — N2889 Other specified disorders of kidney and ureter: Secondary | ICD-10-CM

## 2015-09-16 DIAGNOSIS — C7800 Secondary malignant neoplasm of unspecified lung: Secondary | ICD-10-CM

## 2015-09-16 DIAGNOSIS — K5900 Constipation, unspecified: Secondary | ICD-10-CM

## 2015-09-16 DIAGNOSIS — R05 Cough: Secondary | ICD-10-CM

## 2015-09-16 DIAGNOSIS — D709 Neutropenia, unspecified: Secondary | ICD-10-CM

## 2015-09-16 DIAGNOSIS — Z5111 Encounter for antineoplastic chemotherapy: Secondary | ICD-10-CM

## 2015-09-16 LAB — COMPREHENSIVE METABOLIC PANEL
ALBUMIN: 3.3 g/dL — AB (ref 3.5–5.0)
ALK PHOS: 91 U/L (ref 40–150)
ALT: 21 U/L (ref 0–55)
AST: 22 U/L (ref 5–34)
Anion Gap: 8 mEq/L (ref 3–11)
BILIRUBIN TOTAL: 0.37 mg/dL (ref 0.20–1.20)
BUN: 17.3 mg/dL (ref 7.0–26.0)
CALCIUM: 8.9 mg/dL (ref 8.4–10.4)
CO2: 26 mEq/L (ref 22–29)
CREATININE: 1 mg/dL (ref 0.7–1.3)
Chloride: 104 mEq/L (ref 98–109)
EGFR: 76 mL/min/{1.73_m2} — ABNORMAL LOW (ref >90)
GLUCOSE: 185 mg/dL — AB (ref 70–140)
Potassium: 4.1 mEq/L (ref 3.5–5.1)
SODIUM: 139 meq/L (ref 136–145)
TOTAL PROTEIN: 5.7 g/dL — AB (ref 6.4–8.3)

## 2015-09-16 LAB — CBC WITH DIFFERENTIAL/PLATELET
BASO%: 0.6 % (ref 0.0–2.0)
BASOS ABS: 0 10*3/uL (ref 0.0–0.1)
EOS ABS: 0.1 10*3/uL (ref 0.0–0.5)
EOS%: 1.4 % (ref 0.0–7.0)
HEMATOCRIT: 31.5 % — AB (ref 38.4–49.9)
HEMOGLOBIN: 10.2 g/dL — AB (ref 13.0–17.1)
LYMPH#: 1.2 10*3/uL (ref 0.9–3.3)
LYMPH%: 15.7 % (ref 14.0–49.0)
MCH: 33.3 pg (ref 27.2–33.4)
MCHC: 32.5 g/dL (ref 32.0–36.0)
MCV: 102.6 fL — ABNORMAL HIGH (ref 79.3–98.0)
MONO#: 0.9 10*3/uL (ref 0.1–0.9)
MONO%: 12.3 % (ref 0.0–14.0)
NEUT%: 70 % (ref 39.0–75.0)
NEUTROS ABS: 5.2 10*3/uL (ref 1.5–6.5)
PLATELETS: 178 10*3/uL (ref 140–400)
RBC: 3.08 10*6/uL — ABNORMAL LOW (ref 4.20–5.82)
RDW: 18.4 % — AB (ref 11.0–14.6)
WBC: 7.4 10*3/uL (ref 4.0–10.3)

## 2015-09-16 LAB — MAGNESIUM: Magnesium: 1.8 mg/dl (ref 1.5–2.5)

## 2015-09-16 MED ORDER — SODIUM CHLORIDE 0.9 % IV SOLN
2000.0000 mg | Freq: Once | INTRAVENOUS | Status: AC
  Administered 2015-09-16: 2000 mg via INTRAVENOUS
  Filled 2015-09-16: qty 52.6

## 2015-09-16 MED ORDER — SODIUM CHLORIDE 0.9 % IV SOLN
Freq: Once | INTRAVENOUS | Status: AC
  Administered 2015-09-16: 12:00:00 via INTRAVENOUS
  Filled 2015-09-16: qty 5

## 2015-09-16 MED ORDER — PALONOSETRON HCL INJECTION 0.25 MG/5ML
0.2500 mg | Freq: Once | INTRAVENOUS | Status: AC
  Administered 2015-09-16: 0.25 mg via INTRAVENOUS

## 2015-09-16 MED ORDER — CISPLATIN CHEMO INJECTION 100MG/100ML
70.0000 mg/m2 | Freq: Once | INTRAVENOUS | Status: AC
  Administered 2015-09-16: 138 mg via INTRAVENOUS
  Filled 2015-09-16: qty 138

## 2015-09-16 MED ORDER — PALONOSETRON HCL INJECTION 0.25 MG/5ML
INTRAVENOUS | Status: AC
  Filled 2015-09-16: qty 5

## 2015-09-16 MED ORDER — SODIUM CHLORIDE 0.9 % IV SOLN
Freq: Once | INTRAVENOUS | Status: AC
  Administered 2015-09-16: 09:00:00 via INTRAVENOUS

## 2015-09-16 MED ORDER — HEPARIN SOD (PORK) LOCK FLUSH 100 UNIT/ML IV SOLN
500.0000 [IU] | Freq: Once | INTRAVENOUS | Status: AC | PRN
  Administered 2015-09-16: 500 [IU]
  Filled 2015-09-16: qty 5

## 2015-09-16 MED ORDER — POTASSIUM CHLORIDE 2 MEQ/ML IV SOLN
Freq: Once | INTRAVENOUS | Status: AC
  Administered 2015-09-16: 10:00:00 via INTRAVENOUS
  Filled 2015-09-16: qty 10

## 2015-09-16 MED ORDER — SODIUM CHLORIDE 0.9 % IJ SOLN
10.0000 mL | INTRAMUSCULAR | Status: DC | PRN
  Administered 2015-09-16: 10 mL
  Filled 2015-09-16: qty 10

## 2015-09-16 NOTE — Telephone Encounter (Signed)
Gave and printed appt sched and avs fo rpt for March and April...gv barium

## 2015-09-16 NOTE — Patient Instructions (Signed)
[facility] Discharge Instructions for Patients Receiving Chemotherapy  Today you received the following chemotherapy agents:  Gemzar and Cisplatin.  To help prevent nausea and vomiting after your treatment, we encourage you to take your nausea medication as directed.   If you develop nausea and vomiting that is not controlled by your nausea medication, call the clinic.   BELOW ARE SYMPTOMS THAT SHOULD BE REPORTED IMMEDIATELY:  *FEVER GREATER THAN 100.5 F  *CHILLS WITH OR WITHOUT FEVER  NAUSEA AND VOMITING THAT IS NOT CONTROLLED WITH YOUR NAUSEA MEDICATION  *UNUSUAL SHORTNESS OF BREATH  *UNUSUAL BRUISING OR BLEEDING  TENDERNESS IN MOUTH AND THROAT WITH OR WITHOUT PRESENCE OF ULCERS  *URINARY PROBLEMS  *BOWEL PROBLEMS  UNUSUAL RASH Items with * indicate a potential emergency and should be followed up as soon as possible.  Feel free to call the clinic you have any questions or concerns. The clinic phone number is [phone].  Please show the CHEMO ALERT CARD at check-in to the Emergency Department and triage nurse.   

## 2015-09-16 NOTE — Progress Notes (Signed)
Hematology and Oncology Follow Up Visit  [name] [name] [id number] [birth year] 70 y.o. 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 He presented with 4.9 cm mass of the upper pole of the left kidney with documented pulmonary metastasis. Neurological confirmation done on 04/15/2015 with a biopsy showed urothelial carcinoma.   Prior Therapy:   Status post biopsy of the left renal mass done on 04/15/2015. He is status post Port-A-Cath insertion on 05/21/2015.   Current therapy: Systemic chemotherapy in the form of cisplatin and gemcitabine started on 05/27/2015. Today is day 1 of cycle 6 of therapy.  Interim History:  [name] [name] presents today for a follow-up visit. Since the last visit, he reports doing very well. He has tolerated the last chemotherapy without any major complications. He reports some minor nausea but no vomiting. He feels that dexamethasone has helped his symptoms at this time. Also removing day 8 of chemotherapy cycle have helped. He denied any infusion-related complications, neuropathy or hearing deficits.  He continues to report improvement in his overall health and quality of life. He reports no respiratory symptoms at this time including no cough, dyspnea or hemoptysis. He was able to travel to [place] for a few days and have been eating very well and have gained 5 pounds.   He does not report any headaches, blurry vision, syncope or seizures. He does not report any fevers, chills, sweats with loss he does report some appetite changes. He does not report any chest pain, palpitation, orthopnea or leg edema. He does not report any wheezing or hemoptysis. He does report chronic cough. He does not report any chest pain, palpitation, orthopnea, leg edema. He does not report any  vomiting, abdominal pain does report occasional dyspepsia.  He does not report any frequency, urgency or hesitancy. He does not report any skeletal complaints. Remaining review of systems unremarkable.   Medications: I have reviewed the patient's current medications.  Current Outpatient Prescriptions  Medication Sig Dispense Refill  . acetaminophen (TYLENOL) 325 MG tablet Take 650 mg by mouth every 6 (six) hours as needed for moderate pain or headache.     [name] atorvastatin (LIPITOR) 40 MG tablet Take 1 tablet (40 mg total) by mouth daily. 90 tablet 3  . Cholecalciferol (VITAMIN D) 1000 UNITS capsule Take 1,000 Units by mouth daily.      [name] dexamethasone (DECADRON) 4 MG tablet Take one tablet twice a day for 4 days after each long chemotherapy. 40 tablet 1  . diphenhydrAMINE (SOMINEX) 25 MG tablet Take 25 mg by mouth at bedtime.    . docusate sodium (COLACE) 100 MG capsule Take 1 capsule (100 mg total) by mouth 2 (two) times daily. (Patient taking differently: Take 100 mg by mouth daily as needed for mild constipation. ) 10 capsule 0  . HYDROcodone-homatropine (HYCODAN) 5-1.5 MG/5ML syrup Take 5 mLs by mouth every 6 (six) hours as needed for cough. 120 mL 0  . lidocaine-prilocaine (EMLA) cream Apply to port-a-cath 1-2 hours prior to acces. Cover with saran wrap. 30 g 1  . lisinopril (PRINIVIL,ZESTRIL) 5 MG tablet Take 1 tablet (5 mg total) by mouth daily. (Patient taking differently: Take 5 mg by mouth every evening. ) 90 tablet 3  . metoprolol tartrate (LOPRESSOR) 25 MG tablet Take 0.5 tablets (12.5 mg total) by mouth 2 (two) times daily. 90 tablet 3  . nitroGLYCERIN (NITROSTAT) 0.4 MG  SL tablet Place 1 tablet (0.4 mg total) under the tongue every 5 (five) minutes as needed. May repeat up to 3 doses. 100 tablet 3  . omeprazole (PRILOSEC) 20 MG capsule Take 20 mg by mouth daily.    . ondansetron (ZOFRAN) 8 MG tablet Take 1 tablet (8 mg total) by mouth every 8 (eight) hours as needed for nausea or vomiting. 30 tablet 1  . polycarbophil (FIBERCON) 625 MG tablet  Take 625 mg by mouth daily.     . prochlorperazine (COMPAZINE) 10 MG tablet Take 1 tablet (10 mg total) by mouth every 6 (six) hours as needed for nausea or vomiting. 30 tablet 0  . senna-docusate (SENNA S) 8.6-50 MG tablet Take 1 tablet by mouth 2 (two) times daily. 60 tablet 0  . sildenafil (VIAGRA) 50 MG tablet Take 50 mg by mouth as needed for erectile dysfunction.      No current facility-administered medications for this visit.     Allergies:  Allergies  Allergen Reactions  . Hydrocodone Other (See Comments)    Became too sedated    Past Medical History, Surgical history, Social history, and Family History were reviewed and updated.   Physical Exam: Blood pressure 120/68, pulse 71, temperature 97.7 F (36.5 C), temperature source Oral, resp. rate 18, height 5\' 9"  (1.753 m), weight 180 lb 11.2 oz (81.965 kg), SpO2 97 %. ECOG: 0 General appearance: alert and cooperative not in any distress. Head: Normocephalic, without obvious abnormality no oral thrush. Neck: no adenopathy Lymph nodes: Cervical, supraclavicular, and axillary nodes normal. Heart:regular rate and rhythm, S1, S2 normal, no murmur, click, rub or gallop Lung:chest clear, no wheezing, rales, normal symmetric air entry Abdomin: soft, non-tender, without masses or organomegaly no shifting dullness or ascites. EXT:no erythema, induration, or nodules   Lab Results: Lab Results  Component Value Date   WBC 7.4 09/16/2015   HGB 10.2* 09/16/2015   HCT 31.5* 09/16/2015   MCV 102.6* 09/16/2015   PLT 178 09/16/2015     Chemistry      Component Value Date/Time   NA 139 08/26/2015 0838   NA 136 05/21/2015 0840   K 3.9 08/26/2015 0838   K 4.1 05/21/2015 0840   CL 104 05/21/2015 0840   CO2 26 08/26/2015 0838   CO2 25 05/21/2015 0840   BUN 15.9 08/26/2015 0838   BUN 18 05/21/2015 0840   CREATININE 1.0 08/26/2015 0838   CREATININE 1.04 05/21/2015 0840      Component Value Date/Time   CALCIUM 8.8 08/26/2015  0838   CALCIUM 9.4 05/21/2015 0840   ALKPHOS 84 08/26/2015 0838   ALKPHOS 82 03/05/2015 0831   AST 19 08/26/2015 0838   AST 19 03/05/2015 0831   ALT 17 08/26/2015 0838   ALT 17 03/05/2015 0831   BILITOT <0.30 08/26/2015 0838   BILITOT 0.8 03/05/2015 0831       Impression and Plan:   [name] [name] with the following issues:  1. 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 He did have a pathological confirmation done on 04/15/2015 with the specimen showed urothelial carcinoma.  He is currently on gemcitabine and cisplatin started on 05/27/2015. CT scan on 07/25/2015 After 3 cycles of therapy showed excellent response to therapy.   The plan is to proceed with cycle with cycle 6 of chemotherapy and eliminate day  8 from this cycle. He will also receive staging CT scan in 3 weeks before the next visit. In all likelihood, he will be on a treatment break after that depending on the CT scan results.   2. Nausea and GI complication prophylaxis: He will continue to use dexamethasone for delayed nausea and vomiting after cisplatin therapy. Antiemetic regimen have been successful at this time without any modification needed.  3. IV access: Port-A-Cath in place without any complications. This will be flushed every 6 weeks moving forward.  4. Renal function prophylaxis: Instructions to avoid nephrotoxic drugs were reviewed with the patient. He is include nonsteroidal anti-inflammatories among others. Creatinine has been within normal range with good urine output.  5. Constipation: I have added the Senokot S to his bowel regimen to take twice a day. I continue to urge him to do so especially with Zofran.  6. Cough: Likely related to his pulmonary metastasis. No longer an issue at this time.  7. Neutropenia: His ANC is adequate  today to receive chemotherapy. He will receive Neulasta after cycle 6 on 09/18/2015.  8. Follow-up: Will be in 3 weeks after CT scan to discuss these results.   [facility], MD 3/21/20178:45 AM

## 2015-09-18 ENCOUNTER — Ambulatory Visit (HOSPITAL_BASED_OUTPATIENT_CLINIC_OR_DEPARTMENT_OTHER): Payer: Medicare Other

## 2015-09-18 VITALS — BP 131/67 | HR 70 | Temp 97.7°F

## 2015-09-18 DIAGNOSIS — Z5189 Encounter for other specified aftercare: Secondary | ICD-10-CM | POA: Diagnosis not present

## 2015-09-18 DIAGNOSIS — C7800 Secondary malignant neoplasm of unspecified lung: Secondary | ICD-10-CM

## 2015-09-18 DIAGNOSIS — C689 Malignant neoplasm of urinary organ, unspecified: Secondary | ICD-10-CM | POA: Diagnosis not present

## 2015-09-18 MED ORDER — PEGFILGRASTIM INJECTION 6 MG/0.6ML ~~LOC~~
6.0000 mg | PREFILLED_SYRINGE | Freq: Once | SUBCUTANEOUS | Status: AC
  Administered 2015-09-18: 6 mg via SUBCUTANEOUS
  Filled 2015-09-18: qty 0.6

## 2015-10-07 ENCOUNTER — Ambulatory Visit (HOSPITAL_BASED_OUTPATIENT_CLINIC_OR_DEPARTMENT_OTHER): Payer: Medicare Other

## 2015-10-07 ENCOUNTER — Other Ambulatory Visit (HOSPITAL_BASED_OUTPATIENT_CLINIC_OR_DEPARTMENT_OTHER): Payer: Medicare Other

## 2015-10-07 ENCOUNTER — Ambulatory Visit (HOSPITAL_COMMUNITY)
Admission: RE | Admit: 2015-10-07 | Discharge: 2015-10-07 | Disposition: A | Discharge status: Home / Self Care | Payer: Medicare Other | Source: Ambulatory Visit | Attending: Oncology | Admitting: Oncology

## 2015-10-07 ENCOUNTER — Encounter (HOSPITAL_COMMUNITY): Payer: Self-pay

## 2015-10-07 DIAGNOSIS — C689 Malignant neoplasm of urinary organ, unspecified: Secondary | ICD-10-CM

## 2015-10-07 DIAGNOSIS — N289 Disorder of kidney and ureter, unspecified: Secondary | ICD-10-CM | POA: Diagnosis not present

## 2015-10-07 DIAGNOSIS — C679 Malignant neoplasm of bladder, unspecified: Secondary | ICD-10-CM | POA: Diagnosis not present

## 2015-10-07 DIAGNOSIS — Z95828 Presence of other vascular implants and grafts: Secondary | ICD-10-CM

## 2015-10-07 DIAGNOSIS — R918 Other nonspecific abnormal finding of lung field: Secondary | ICD-10-CM | POA: Insufficient documentation

## 2015-10-07 DIAGNOSIS — Z951 Presence of aortocoronary bypass graft: Secondary | ICD-10-CM | POA: Insufficient documentation

## 2015-10-07 LAB — COMPREHENSIVE METABOLIC PANEL
ALBUMIN: 3.5 g/dL (ref 3.5–5.0)
ALK PHOS: 100 U/L (ref 40–150)
ALT: 23 U/L (ref 0–55)
AST: 22 U/L (ref 5–34)
Anion Gap: 8 mEq/L (ref 3–11)
BILIRUBIN TOTAL: 0.4 mg/dL (ref 0.20–1.20)
BUN: 17.5 mg/dL (ref 7.0–26.0)
CO2: 27 mEq/L (ref 22–29)
CREATININE: 1.1 mg/dL (ref 0.7–1.3)
Calcium: 9.3 mg/dL (ref 8.4–10.4)
Chloride: 105 mEq/L (ref 98–109)
EGFR: 68 mL/min/{1.73_m2} — ABNORMAL LOW (ref >90)
GLUCOSE: 190 mg/dL — AB (ref 70–140)
POTASSIUM: 4.1 meq/L (ref 3.5–5.1)
SODIUM: 140 meq/L (ref 136–145)
TOTAL PROTEIN: 5.8 g/dL — AB (ref 6.4–8.3)

## 2015-10-07 LAB — CBC WITH DIFFERENTIAL/PLATELET
BASO%: 0.6 % (ref 0.0–2.0)
Basophils Absolute: 0 10*3/uL (ref 0.0–0.1)
EOS%: 2.1 % (ref 0.0–7.0)
Eosinophils Absolute: 0.1 10*3/uL (ref 0.0–0.5)
HCT: 32.6 % — ABNORMAL LOW (ref 38.4–49.9)
HEMOGLOBIN: 10.7 g/dL — AB (ref 13.0–17.1)
LYMPH#: 0.9 10*3/uL (ref 0.9–3.3)
LYMPH%: 15.2 % (ref 14.0–49.0)
MCH: 34.7 pg — ABNORMAL HIGH (ref 27.2–33.4)
MCHC: 32.9 g/dL (ref 32.0–36.0)
MCV: 105.4 fL — ABNORMAL HIGH (ref 79.3–98.0)
MONO#: 0.7 10*3/uL (ref 0.1–0.9)
MONO%: 11.7 % (ref 0.0–14.0)
NEUT%: 70.4 % (ref 39.0–75.0)
NEUTROS ABS: 4.2 10*3/uL (ref 1.5–6.5)
Platelets: 143 10*3/uL (ref 140–400)
RBC: 3.1 10*6/uL — ABNORMAL LOW (ref 4.20–5.82)
RDW: 15.8 % — AB (ref 11.0–14.6)
WBC: 5.9 10*3/uL (ref 4.0–10.3)

## 2015-10-07 MED ORDER — IOPAMIDOL (ISOVUE-300) INJECTION 61%
150.0000 mL | Freq: Once | INTRAVENOUS | Status: AC | PRN
  Administered 2015-10-07: 125 mL via INTRAVENOUS

## 2015-10-07 MED ORDER — SODIUM CHLORIDE 0.9% FLUSH
10.0000 mL | INTRAVENOUS | Status: DC | PRN
  Administered 2015-10-07: 10 mL via INTRAVENOUS
  Filled 2015-10-07: qty 10

## 2015-10-07 NOTE — Patient Instructions (Signed)

## 2015-10-09 ENCOUNTER — Ambulatory Visit (HOSPITAL_BASED_OUTPATIENT_CLINIC_OR_DEPARTMENT_OTHER): Payer: Medicare Other | Admitting: Oncology

## 2015-10-09 ENCOUNTER — Telehealth: Payer: Self-pay | Admitting: Oncology

## 2015-10-09 VITALS — BP 124/69 | HR 62 | Temp 97.7°F | Resp 18 | Ht 69.0 in | Wt 174.1 lb

## 2015-10-09 DIAGNOSIS — C689 Malignant neoplasm of urinary organ, unspecified: Secondary | ICD-10-CM | POA: Diagnosis not present

## 2015-10-09 DIAGNOSIS — C7800 Secondary malignant neoplasm of unspecified lung: Secondary | ICD-10-CM

## 2015-10-09 NOTE — Progress Notes (Signed)
Hematology and Oncology Follow Up Visit  [name] [name] [id number] [birth year] 69 y.o. 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 He presented with 4.9 cm mass of the upper pole of the left kidney with documented pulmonary metastasis. Neurological confirmation done on 04/15/2015 with a biopsy showed urothelial carcinoma.   Prior Therapy:   Status post biopsy of the left renal mass done on 04/15/2015. He is status post Port-A-Cath insertion on 05/21/2015. Systemic chemotherapy in the form of cisplatin and gemcitabine started on 05/27/2015. He is status post 6 cycles completed on 09/16/2015.  Current therapy:   Interim History:  Mr. Salami presents today for a follow-up visit. Since the last visit, he reports no major delayed complications related to chemotherapy. He does have faint sensory neuropathy and abdominal fullness but the majority of his side effects have resolved. He reports some minor nausea but no vomiting.  He continues to report improvement in his overall health and quality of life. He reports no respiratory symptoms at this time including no cough, dyspnea or hemoptysis. He is planning a camping trip in the near future.   He does not report any headaches, blurry vision, syncope or seizures. He does not report any fevers, chills, sweats with loss he does report some appetite changes. He does not report any chest pain, palpitation, orthopnea or leg edema. He does not report any wheezing or hemoptysis. He does report chronic cough. He does not report any chest pain, palpitation, orthopnea, leg edema. He does not report any  vomiting, abdominal pain does report occasional dyspepsia. He does not report any frequency, urgency or hesitancy. He does not report any skeletal complaints. Remaining review of systems unremarkable.   Medications: I have  reviewed the patient's current medications.  Current Outpatient Prescriptions  Medication Sig Dispense Refill  . acetaminophen (TYLENOL) 325 MG tablet Take 650 mg by mouth every 6 (six) hours as needed for moderate pain or headache.     [name] atorvastatin (LIPITOR) 40 MG tablet Take 1 tablet (40 mg total) by mouth daily. 90 tablet 3  . Cholecalciferol (VITAMIN D) 1000 UNITS capsule Take 1,000 Units by mouth daily.      [name] dexamethasone (DECADRON) 4 MG tablet Take one tablet twice a day for 4 days after each long chemotherapy. 40 tablet 1  . diphenhydrAMINE (SOMINEX) 25 MG tablet Take 25 mg by mouth at bedtime.    . docusate sodium (COLACE) 100 MG capsule Take 1 capsule (100 mg total) by mouth 2 (two) times daily. (Patient taking differently: Take 100 mg by mouth daily as needed for mild constipation. ) 10 capsule 0  . HYDROcodone-homatropine (HYCODAN) 5-1.5 MG/5ML syrup Take 5 mLs by mouth every 6 (six) hours as needed for cough. 120 mL 0  . lidocaine-prilocaine (EMLA) cream Apply to port-a-cath 1-2 hours prior to acces. Cover with saran wrap. 30 g 1  . lisinopril (PRINIVIL,ZESTRIL) 5 MG tablet Take 1 tablet (5 mg total) by mouth daily. (Patient taking differently: Take 5 mg by mouth every evening. ) 90 tablet 3  . metoprolol tartrate (LOPRESSOR) 25 MG tablet Take 0.5 tablets (12.5 mg total) by mouth 2 (two) times daily. 90 tablet 3  . nitroGLYCERIN (NITROSTAT) 0.4 MG SL tablet Place 1 tablet (0.4 mg total) under the tongue every 5 (five) minutes as needed. May repeat up to 3 doses. 100 tablet 3  . omeprazole (PRILOSEC)  20 MG capsule Take 20 mg by mouth daily.    . ondansetron (ZOFRAN) 8 MG tablet Take 1 tablet (8 mg total) by mouth every 8 (eight) hours as needed for nausea or vomiting. 30 tablet 1  . polycarbophil (FIBERCON) 625 MG tablet Take 625 mg by mouth daily.     . prochlorperazine (COMPAZINE) 10 MG tablet Take 1 tablet (10 mg total) by mouth every 6 (six) hours as needed for nausea or vomiting.  30 tablet 0  . senna-docusate (SENNA S) 8.6-50 MG tablet Take 1 tablet by mouth 2 (two) times daily. 60 tablet 0  . sildenafil (VIAGRA) 50 MG tablet Take 50 mg by mouth as needed for erectile dysfunction.      No current facility-administered medications for this visit.     Allergies:  Allergies  Allergen Reactions  . Hydrocodone Other (See Comments)    Became too sedated    Past Medical History, Surgical history, Social history, and Family History were reviewed and updated.   Physical Exam: Blood pressure 124/69, pulse 62, temperature 97.7 F (36.5 C), temperature source Oral, resp. rate 18, height 5\' 9"  (1.753 m), weight 174 lb 1.6 oz (78.971 kg), SpO2 100 %. ECOG: 0 General appearance: Alert, pleasant gentleman without distress. Head: Normocephalic, without obvious abnormality no oral ulcers or lesions. Neck: no adenopathy Lymph nodes: Cervical, supraclavicular, and axillary nodes normal. Heart:regular rate and rhythm, S1, S2 normal, no murmur, click, rub or gallop Lung:chest clear, no wheezing, rales, normal symmetric air entry Abdomin: soft, non-tender, without masses or organomegaly no rebound or guarding. EXT:no erythema, induration, or nodules   Lab Results: Lab Results  Component Value Date   WBC 5.9 10/07/2015   HGB 10.7* 10/07/2015   HCT 32.6* 10/07/2015   MCV 105.4* 10/07/2015   PLT 143 10/07/2015     Chemistry      Component Value Date/Time   NA 140 10/07/2015 0803   NA 136 05/21/2015 0840   K 4.1 10/07/2015 0803   K 4.1 05/21/2015 0840   CL 104 05/21/2015 0840   CO2 27 10/07/2015 0803   CO2 25 05/21/2015 0840   BUN 17.5 10/07/2015 0803   BUN 18 05/21/2015 0840   CREATININE 1.1 10/07/2015 0803   CREATININE 1.04 05/21/2015 0840      Component Value Date/Time   CALCIUM 9.3 10/07/2015 0803   CALCIUM 9.4 05/21/2015 0840   ALKPHOS 100 10/07/2015 0803   ALKPHOS 82 03/05/2015 0831   AST 22 10/07/2015 0803   AST 19 03/05/2015 0831   ALT 23  10/07/2015 0803   ALT 17 03/05/2015 0831   BILITOT 0.40 10/07/2015 0803   BILITOT 0.8 03/05/2015 0831     EXAM: CT CHEST, ABDOMEN, AND PELVIS WITH CONTRAST  TECHNIQUE: Multidetector CT imaging of the chest, abdomen and pelvis was performed following the standard protocol during bolus administration of intravenous contrast.  CONTRAST: 137mL ISOVUE-300 IOPAMIDOL (ISOVUE-300) INJECTION 61%  COMPARISON: 07/25/2015  FINDINGS: CT CHEST FINDINGS  Mediastinum/Lymph Nodes: There is no axillary lymphadenopathy. No mediastinal lymphadenopathy. There is no hilar lymphadenopathy. The heart size is normal. No pericardial effusion. Patient is status post CABG. Right Port-A-Cath tip positioned in the upper right atrium. The esophagus has normal imaging features.  Lungs/Pleura: The multiple bilateral pulmonary nodules seen previously have continued to decrease in the interval. Some of the smaller pulmonary nodule seen on the previous study have completely resolved in the interval. The index lesion identified previously in the left lower lobe is seen on image  81 of series 4 today, measuring 4 mm in the same dimension that it measured 11 mm previously. No new or enlarging pulmonary nodule or mass is evident.  Musculoskeletal: Bone windows reveal no worrisome lytic or sclerotic osseous lesions.  CT ABDOMEN PELVIS FINDINGS  Hepatobiliary: 17 mm water density lesion in the left liver was 15 mm previously. This is compatible with a cyst. There is no evidence for gallstones, gallbladder wall thickening, or pericholecystic fluid. No intrahepatic or extrahepatic biliary dilation.  Pancreas: No focal mass lesion. No dilatation of the main duct. No intraparenchymal cyst. No peripancreatic edema.  Spleen: No splenomegaly. No focal mass lesion.  Adrenals/Urinary Tract: No adrenal nodule or mass. No evidence for hydronephrosis. Masslike opacity seen previously in the upper pole of  the left kidney is less prominent today measuring 16 x 22 mm today compared 21 x 25 mm previously. The left double-J internal ureteral stent remains in place with stable position. No right hydroureter. Bladder is decompressed.  Stomach/Bowel: Stomach is nondistended. No gastric wall thickening. No evidence of outlet obstruction. Duodenum is normally positioned as is the ligament of Treitz. No small bowel wall thickening. No small bowel dilatation. The terminal ileum is normal. The appendix is normal. No gross colonic mass. No colonic wall thickening. No substantial diverticular change.  Vascular/Lymphatic: There is abdominal aortic atherosclerosis without aneurysm. There is no gastrohepatic or hepatoduodenal ligament lymphadenopathy. No intraperitoneal or retroperitoneal lymphadenopathy. No pelvic sidewall lymphadenopathy.  Reproductive: The prostate gland and seminal vesicles have normal imaging features.  Other: No intraperitoneal free fluid.  Musculoskeletal: Bone windows reveal no worrisome lytic or sclerotic osseous lesions.  IMPRESSION: Continued further decrease in the bilateral pulmonary nodules with some interval resolution of the smaller nodules.  Continued further interval decrease in size of the left upper pole renal lesion.  No new or progressive findings on today's exam.   Impression and Plan:   70 year old gentleman with the following issues:  1. 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 He did have a pathological confirmation done on 04/15/2015 with the specimen showed urothelial carcinoma.  He is currently on gemcitabine and cisplatin started on 05/27/2015. CT scan on 07/25/2015 After 3 cycles of therapy showed excellent response to therapy.   He is status post 6 cycles of therapy  completed in March 2017. CT scan on 10/07/2015 was reviewed today and showed continuous response to therapy. He had further decrease in his bilateral pulmonary nodules as well as decrease in the left upper pole renal lesion.  The plan is to proceed with a treatment holiday at this time and repeat imaging studies in 3 months. If he shows progression of disease, second line therapy will be used. He would be a good candidate for Tecentriq immunotherapy as a salvage regimen.   2. Nausea and GI complication prophylaxis: This have resolved at this time.  3. IV access: Port-A-Cath in place without any complications. This will be flushed every 6 weeks moving forward.  4. Renal function prophylaxis: Creatinine remains stable.  5. Constipation: This have resolved.  6. Cough: Likely related to his pulmonary metastasis. No longer an issue at this time.  7. Neutropenia: Neulasta is no longer needed at this time.  8. Follow-up: Will be in 6 weeks for a clinical visit.   [facility], MD 4/13/201710:17 AM

## 2015-10-09 NOTE — Telephone Encounter (Signed)
per pof to sch pt appt-gave pt copy of avs °

## 2015-10-24 DIAGNOSIS — C642 Malignant neoplasm of left kidney, except renal pelvis: Secondary | ICD-10-CM | POA: Diagnosis not present

## 2015-10-24 DIAGNOSIS — R310 Gross hematuria: Secondary | ICD-10-CM | POA: Diagnosis not present

## 2015-10-24 DIAGNOSIS — Z0000 Encounter for general adult medical examination without abnormal findings: Secondary | ICD-10-CM | POA: Diagnosis not present

## 2015-11-03 DIAGNOSIS — D239 Other benign neoplasm of skin, unspecified: Secondary | ICD-10-CM | POA: Diagnosis not present

## 2015-11-03 DIAGNOSIS — L570 Actinic keratosis: Secondary | ICD-10-CM | POA: Diagnosis not present

## 2015-11-03 DIAGNOSIS — L821 Other seborrheic keratosis: Secondary | ICD-10-CM | POA: Diagnosis not present

## 2015-11-21 ENCOUNTER — Telehealth: Payer: Self-pay | Admitting: Oncology

## 2015-11-21 ENCOUNTER — Ambulatory Visit: Payer: Medicare Other

## 2015-11-21 ENCOUNTER — Other Ambulatory Visit (HOSPITAL_BASED_OUTPATIENT_CLINIC_OR_DEPARTMENT_OTHER): Payer: Medicare Other

## 2015-11-21 ENCOUNTER — Ambulatory Visit (HOSPITAL_BASED_OUTPATIENT_CLINIC_OR_DEPARTMENT_OTHER): Payer: Medicare Other | Admitting: Oncology

## 2015-11-21 VITALS — BP 119/73 | HR 62 | Temp 97.8°F | Resp 17 | Wt 178.2 lb

## 2015-11-21 DIAGNOSIS — C7800 Secondary malignant neoplasm of unspecified lung: Secondary | ICD-10-CM

## 2015-11-21 DIAGNOSIS — C689 Malignant neoplasm of urinary organ, unspecified: Secondary | ICD-10-CM

## 2015-11-21 LAB — CBC WITH DIFFERENTIAL/PLATELET
BASO%: 0.5 % (ref 0.0–2.0)
Basophils Absolute: 0 10*3/uL (ref 0.0–0.1)
EOS%: 1.8 % (ref 0.0–7.0)
Eosinophils Absolute: 0.1 10*3/uL (ref 0.0–0.5)
HCT: 36.2 % — ABNORMAL LOW (ref 38.4–49.9)
HGB: 12.1 g/dL — ABNORMAL LOW (ref 13.0–17.1)
LYMPH#: 1.3 10*3/uL (ref 0.9–3.3)
LYMPH%: 33.6 % (ref 14.0–49.0)
MCH: 34.1 pg — ABNORMAL HIGH (ref 27.2–33.4)
MCHC: 33.4 g/dL (ref 32.0–36.0)
MCV: 102 fL — ABNORMAL HIGH (ref 79.3–98.0)
MONO#: 0.4 10*3/uL (ref 0.1–0.9)
MONO%: 11.2 % (ref 0.0–14.0)
NEUT#: 2.1 10*3/uL (ref 1.5–6.5)
NEUT%: 52.9 % (ref 39.0–75.0)
Platelets: 111 10*3/uL — ABNORMAL LOW (ref 140–400)
RBC: 3.55 10*6/uL — AB (ref 4.20–5.82)
RDW: 11.9 % (ref 11.0–14.6)
WBC: 3.9 10*3/uL — ABNORMAL LOW (ref 4.0–10.3)

## 2015-11-21 LAB — COMPREHENSIVE METABOLIC PANEL
ALT: 18 U/L (ref 0–55)
AST: 23 U/L (ref 5–34)
Albumin: 3.7 g/dL (ref 3.5–5.0)
Alkaline Phosphatase: 79 U/L (ref 40–150)
Anion Gap: 7 mEq/L (ref 3–11)
BUN: 21.1 mg/dL (ref 7.0–26.0)
CHLORIDE: 106 meq/L (ref 98–109)
CO2: 27 meq/L (ref 22–29)
CREATININE: 1 mg/dL (ref 0.7–1.3)
Calcium: 9.1 mg/dL (ref 8.4–10.4)
EGFR: 78 mL/min/{1.73_m2} — ABNORMAL LOW (ref >90)
Glucose: 134 mg/dl (ref 70–140)
Potassium: 4.1 mEq/L (ref 3.5–5.1)
SODIUM: 139 meq/L (ref 136–145)
Total Bilirubin: 0.64 mg/dL (ref 0.20–1.20)
Total Protein: 6.1 g/dL — ABNORMAL LOW (ref 6.4–8.3)

## 2015-11-21 MED ORDER — SODIUM CHLORIDE 0.9% FLUSH
10.0000 mL | INTRAVENOUS | Status: DC | PRN
  Filled 2015-11-21: qty 10

## 2015-11-21 MED ORDER — HEPARIN SOD (PORK) LOCK FLUSH 100 UNIT/ML IV SOLN
500.0000 [IU] | Freq: Once | INTRAVENOUS | Status: DC
  Filled 2015-11-21: qty 5

## 2015-11-21 MED ORDER — HEPARIN SOD (PORK) LOCK FLUSH 100 UNIT/ML IV SOLN
500.0000 [IU] | Freq: Once | INTRAVENOUS | Status: AC
  Administered 2015-11-21: 500 [IU] via INTRAVENOUS
  Filled 2015-11-21: qty 5

## 2015-11-21 MED ORDER — SODIUM CHLORIDE 0.9% FLUSH
10.0000 mL | INTRAVENOUS | Status: DC | PRN
  Administered 2015-11-21: 10 mL via INTRAVENOUS
  Filled 2015-11-21: qty 10

## 2015-11-21 NOTE — Telephone Encounter (Signed)
Gave pt apt & avs °

## 2015-11-21 NOTE — Progress Notes (Signed)
Hematology and Oncology Follow Up Visit  [name] [name] [id number] 1945-11-05 70 y.o. 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 He presented with 4.9 cm mass of the upper pole of the left kidney with documented pulmonary metastasis. Neurological confirmation done on 04/15/2015 with a biopsy showed urothelial carcinoma.   Prior Therapy:   Status post biopsy of the left renal mass done on 04/15/2015. He is status post Port-A-Cath insertion on 05/21/2015. Systemic chemotherapy in the form of cisplatin and gemcitabine started on 05/27/2015. He is status post 6 cycles completed on 09/16/2015.  Current therapy: Observation and surveillance.  Interim History:  [name] [name] presents today for a follow-up visit. Since the last visit, he continues to improve and feels well. He does not reports delayed complications related to chemotherapy. He does have faint sensory neuropathy in his upper extremities which have not progressed. He reports no respiratory symptoms at this time including no cough, dyspnea or hemoptysis. He continues to be active and eating well. His quality of life is dramatically improve since stopping chemotherapy.   He does not report any headaches, blurry vision, syncope or seizures. He does not report any fevers, chills, sweats with loss he does report some appetite changes. He does not report any chest pain, palpitation, orthopnea or leg edema. He does not report any wheezing or hemoptysis. He does report chronic cough. He does not report any chest pain, palpitation, orthopnea, leg edema. He does not report any  vomiting, abdominal pain does report occasional dyspepsia. He does not report any frequency, urgency or hesitancy. He does not report any skeletal complaints. Remaining review of systems unremarkable.   Medications: I have reviewed the  patient's current medications.  Current Outpatient Prescriptions  Medication Sig Dispense Refill  . acetaminophen (TYLENOL) 325 MG tablet Take 650 mg by mouth every 6 (six) hours as needed for moderate pain or headache.     [name] atorvastatin (LIPITOR) 40 MG tablet Take 1 tablet (40 mg total) by mouth daily. 90 tablet 3  . Cholecalciferol (VITAMIN D) 1000 UNITS capsule Take 1,000 Units by mouth daily.      . diphenhydrAMINE (SOMINEX) 25 MG tablet Take 25 mg by mouth at bedtime.    . docusate sodium (COLACE) 100 MG capsule Take 1 capsule (100 mg total) by mouth 2 (two) times daily. (Patient taking differently: Take 100 mg by mouth daily as needed for mild constipation. ) 10 capsule 0  . HYDROcodone-homatropine (HYCODAN) 5-1.5 MG/5ML syrup Take 5 mLs by mouth every 6 (six) hours as needed for cough. 120 mL 0  . lisinopril (PRINIVIL,ZESTRIL) 5 MG tablet Take 1 tablet (5 mg total) by mouth daily. (Patient taking differently: Take 5 mg by mouth every evening. ) 90 tablet 3  . metoprolol tartrate (LOPRESSOR) 25 MG tablet Take 0.5 tablets (12.5 mg total) by mouth 2 (two) times daily. 90 tablet 3  . omeprazole (PRILOSEC) 20 MG capsule Take 20 mg by mouth daily.    . polycarbophil (FIBERCON) 625 MG tablet Take 625 mg by mouth daily.     [name] senna-docusate (SENNA S) 8.6-50 MG tablet Take 1 tablet by mouth 2 (two) times daily. 60 tablet 0  . sildenafil (VIAGRA) 50 MG tablet Take 50 mg by mouth as needed for erectile dysfunction.     [name] dexamethasone (DECADRON) 4 MG tablet Take one tablet twice a day for 4 days after  each long chemotherapy. (Patient not taking: Reported on 11/21/2015) 40 tablet 1  . lidocaine-prilocaine (EMLA) cream Apply to port-a-cath 1-2 hours prior to acces. Cover with saran wrap. (Patient not taking: Reported on 11/21/2015) 30 g 1  . nitroGLYCERIN (NITROSTAT) 0.4 MG SL tablet Place 1 tablet (0.4 mg total) under the tongue every 5 (five) minutes as needed. May repeat up to 3 doses. 100 tablet 3  .  ondansetron (ZOFRAN) 8 MG tablet Take 1 tablet (8 mg total) by mouth every 8 (eight) hours as needed for nausea or vomiting. (Patient not taking: Reported on 11/21/2015) 30 tablet 1  . prochlorperazine (COMPAZINE) 10 MG tablet Take 1 tablet (10 mg total) by mouth every 6 (six) hours as needed for nausea or vomiting. (Patient not taking: Reported on 11/21/2015) 30 tablet 0   No current facility-administered medications for this visit.     Allergies:  Allergies  Allergen Reactions  . Hydrocodone Other (See Comments)    Became too sedated    Past Medical History, Surgical history, Social history, and Family History were reviewed and updated.   Physical Exam: Blood pressure 119/73, pulse 62, temperature 97.8 F (36.6 C), temperature source Oral, resp. rate 17, weight 178 lb 3.2 oz (80.831 kg), SpO2 99 %. ECOG: 0 General appearance: Pleasant gentleman without distress. Head: Normocephalic, without obvious abnormality no oral thrush noted. Neck: no adenopathy Lymph nodes: Cervical, supraclavicular, and axillary nodes normal. Heart:regular rate and rhythm, S1, S2 normal, no murmur, click, rub or gallop Lung:chest clear, no wheezing, rales, normal symmetric air entry Abdomin: soft, non-tender, without masses or organomegaly no shifting dullness or ascites. EXT:no erythema, induration, or nodules   Lab Results: Lab Results  Component Value Date   WBC 3.9* 11/21/2015   HGB 12.1* 11/21/2015   HCT 36.2* 11/21/2015   MCV 102.0* 11/21/2015   PLT 111* 11/21/2015     Chemistry      Component Value Date/Time   NA 139 11/21/2015 1117   NA 136 05/21/2015 0840   K 4.1 11/21/2015 1117   K 4.1 05/21/2015 0840   CL 104 05/21/2015 0840   CO2 27 11/21/2015 1117   CO2 25 05/21/2015 0840   BUN 21.1 11/21/2015 1117   BUN 18 05/21/2015 0840   CREATININE 1.0 11/21/2015 1117   CREATININE 1.04 05/21/2015 0840      Component Value Date/Time   CALCIUM 9.1 11/21/2015 1117   CALCIUM 9.4 05/21/2015  0840   ALKPHOS 79 11/21/2015 1117   ALKPHOS 82 03/05/2015 0831   AST 23 11/21/2015 1117   AST 19 03/05/2015 0831   ALT 18 11/21/2015 1117   ALT 17 03/05/2015 0831   BILITOT 0.64 11/21/2015 1117   BILITOT 0.8 03/05/2015 0831       Impression and Plan:   70 year old gentleman with the following issues:  1. 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 He did have a pathological confirmation done on 04/15/2015 with the specimen showed urothelial carcinoma.  He is currently on gemcitabine and cisplatin started on 05/27/2015. CT scan on 07/25/2015 After 3 cycles of therapy showed excellent response to therapy.   He is status post 6 cycles of therapy completed in March 2017. CT scan on 04/11/2017showed continuous response to therapy. He had further decrease in his bilateral pulmonary nodules as well as decrease in the left upper  pole renal lesion.  The plan is to continue with active surveillance and treatment holidays for the time being. I will repeat imaging studies in July 2017 and discuss further options of treatment at that time. His options would be to continue surveillance versus using immunotherapy as second line maintenance at that time. He is agreeable with this plan and prefers to be off chemotherapy in the interim.   2. Nausea and GI complication prophylaxis: He is no longer reporting nausea or vomiting. His appetite is improved no intervention is needed.  3. IV access: Port-A-Cath in place without any complications. This will be flushed every 6 weeks moving forward.  4. Renal function prophylaxis: Creatinine remains stable.  5. Cough: Likely related to his pulmonary metastasis. 7 resolved at this time.  6. Follow-up: Will be in 6 weeks for repeat imaging studies.  Zola Button, MD 5/26/201712:44 PM

## 2015-11-25 DIAGNOSIS — C642 Malignant neoplasm of left kidney, except renal pelvis: Secondary | ICD-10-CM | POA: Diagnosis not present

## 2015-11-25 DIAGNOSIS — R310 Gross hematuria: Secondary | ICD-10-CM | POA: Diagnosis not present

## 2015-11-25 DIAGNOSIS — Z0000 Encounter for general adult medical examination without abnormal findings: Secondary | ICD-10-CM | POA: Diagnosis not present

## 2015-11-26 ENCOUNTER — Encounter: Payer: Self-pay | Admitting: *Deleted

## 2015-12-02 ENCOUNTER — Ambulatory Visit (INDEPENDENT_AMBULATORY_CARE_PROVIDER_SITE_OTHER): Payer: Medicare Other | Admitting: Physician Assistant

## 2015-12-02 VITALS — BP 122/76 | HR 77 | Temp 98.0°F | Resp 17 | Ht 69.0 in | Wt 179.0 lb

## 2015-12-02 DIAGNOSIS — L729 Follicular cyst of the skin and subcutaneous tissue, unspecified: Secondary | ICD-10-CM | POA: Diagnosis not present

## 2015-12-02 DIAGNOSIS — L600 Ingrowing nail: Secondary | ICD-10-CM | POA: Diagnosis not present

## 2015-12-02 DIAGNOSIS — L089 Local infection of the skin and subcutaneous tissue, unspecified: Secondary | ICD-10-CM

## 2015-12-02 MED ORDER — CEPHALEXIN 500 MG PO CAPS
500.0000 mg | ORAL_CAPSULE | Freq: Three times a day (TID) | ORAL | Status: DC
Start: 2015-12-02 — End: 2016-01-08

## 2015-12-02 NOTE — Patient Instructions (Addendum)
  INGROWN TOENAIL . Keep area clean, dry and bandaged for 24 hours. . After 24 hours, remove outer bandage and leave yellow gauze in place. Domingo Madeira toe/foot in warm soapy water for 5-10 minutes, once daily for 5 days. Rebandage toe after each cleaning. . Continue soaks until yellow gauze falls off. . Notify the office if you experience any of the following signs of infection: Swelling, redness, pus drainage, streaking, fever > 101.0 F     IF you received an x-ray today, you will receive an invoice from [facility]. Please contact [facility] at [phone] with questions or concerns regarding your invoice.   IF you received labwork today, you will receive an invoice from [employer]. Please contact [facility] at [phone] with questions or concerns regarding your invoice.   Our billing staff will not be able to assist you with questions regarding bills from these companies.  You will be contacted with the lab results as soon as they are available. The fastest way to get your results is to activate your My Chart account. Instructions are located on the last page of this paperwork. If you have not heard from [place] regarding the results in 2 weeks, please contact this office.

## 2015-12-02 NOTE — Progress Notes (Signed)
[name] [name]  MRN: [id number] DOB: 02-06-1946  Subjective:  Pt presents to clinic with what he thinks is a ingrown toenail - the pain is worse over the last week or so.  He had the toenail excised about 3 years ago for an ingrown toenail and it was doing good and he has been trying to cut it straight across since then but a few weeks ago it started to hurt and it has not stopped.  He he also has an area on his right elbow that was a red bump that was tender that has gotten some better but he wants to make sure it is not infected.    He finished Chemotherappy 3/21 - he still portacath in place but he is having no trouble with that area  Patient Active Problem List   Diagnosis Date Noted  . Urothelial cancer ([place]) 05/16/2015  . Renal mass, left   . Pulmonary nodule 09/19/2011  . Hematuria 09/09/2011  . Hyperlipidemia 12/22/2010  . Atherosclerosis of coronary artery bypass graft 05/14/2010  . ATRIAL FIBRILLATION 05/14/2010  . CHEST PAIN 04/10/2010  . ARM PAIN, LEFT 04/01/2010  . TOBACCO ABUSE 01/19/2010  . COPD 01/19/2010  . HYPERTENSION 01/06/2010  . GERD 01/06/2010  . ERECTILE DYSFUNCTION, ORGANIC 01/06/2010  . CHICKENPOX, HX OF 01/06/2010    Current Outpatient Prescriptions on File Prior to Visit  Medication Sig Dispense Refill  . acetaminophen (TYLENOL) 325 MG tablet Take 650 mg by mouth every 6 (six) hours as needed for moderate pain or headache.     [name] atorvastatin (LIPITOR) 40 MG tablet Take 1 tablet (40 mg total) by mouth daily. 90 tablet 3  . Cholecalciferol (VITAMIN D) 1000 UNITS capsule Take 1,000 Units by mouth daily.      . diphenhydrAMINE (SOMINEX) 25 MG tablet Take 25 mg by mouth at bedtime.    . docusate sodium (COLACE) 100 MG capsule Take 1 capsule (100 mg total) by mouth 2 (two) times daily. (Patient taking differently: Take 100 mg by mouth daily as needed for mild constipation. ) 10 capsule 0  . lidocaine-prilocaine (EMLA) cream Apply to port-a-cath 1-2 hours  prior to acces. Cover with saran wrap. 30 g 1  . lisinopril (PRINIVIL,ZESTRIL) 5 MG tablet Take 1 tablet (5 mg total) by mouth daily. (Patient taking differently: Take 5 mg by mouth every evening. ) 90 tablet 3  . metoprolol tartrate (LOPRESSOR) 25 MG tablet Take 0.5 tablets (12.5 mg total) by mouth 2 (two) times daily. 90 tablet 3  . nitroGLYCERIN (NITROSTAT) 0.4 MG SL tablet Place 1 tablet (0.4 mg total) under the tongue every 5 (five) minutes as needed. May repeat up to 3 doses. 100 tablet 3  . omeprazole (PRILOSEC) 20 MG capsule Take 20 mg by mouth daily.    . polycarbophil (FIBERCON) 625 MG tablet Take 625 mg by mouth daily.     [name] senna-docusate (SENNA S) 8.6-50 MG tablet Take 1 tablet by mouth 2 (two) times daily. 60 tablet 0  . sildenafil (VIAGRA) 50 MG tablet Take 50 mg by mouth as needed for erectile dysfunction.      No current facility-administered medications on file prior to visit.    Allergies  Allergen Reactions  . Hydrocodone Other (See Comments)    Became too sedated    Review of Systems  Constitutional: Negative for fever and chills.  Skin: Positive for wound.   Objective:  BP 122/76 mmHg  Pulse 77  Temp(Src) 98 F (36.7 C) (Oral)  Resp 17  Ht 5\' 9"  (1.753 m)  Wt 179 lb (81.194 kg)  BMI 26.42 kg/m2  SpO2 97%  Physical Exam  Constitutional: He is oriented to person, place, and time and well-developed, well-nourished, and in no distress.  HENT:  Head: Normocephalic and atraumatic.  Right Ear: External ear normal.  Left Ear: External ear normal.  Eyes: Conjunctivae are normal.  Neck: Normal range of motion.  Pulmonary/Chest: Effort normal.  Neurological: He is alert and oriented to person, place, and time. Gait normal.  Skin: Skin is warm and dry.  Medial ingrown toenail on the right side with erythema but no pus expressed - it is TTP  Left forearm - 1 cm indurated circular area with erythema and TTP but no warmth palpable - feels like a cyst    Psychiatric: Mood, memory, affect and judgment normal.   Procedure:  Consent obtained.  Digital block with 1% lido and marcaine 1:1 - betadine prep to area and sterile drape placed - medial nail removed and xeroform gauzed placed on nailbed.  Drsg placed.  Assessment and Plan :  Ingrown left big toenail - Plan: cephALEXin (KEFLEX) 500 MG capsule  Infected cyst of skin - Plan: cephALEXin (KEFLEX) 500 MG capsule   Wound care d/w pt of both the cyst cellulitis and ingrown toenail.  Windell Hummingbird PA-C  [facility] 12/02/2015 3:01 PM

## 2015-12-04 ENCOUNTER — Telehealth: Payer: Self-pay | Admitting: Family Medicine

## 2015-12-04 NOTE — Telephone Encounter (Signed)
LVM per [facility]. Pt has not been seen since 2013 and would like to know if he wants to re-est with Dr. Anthonette Legato min office visit

## 2016-01-06 ENCOUNTER — Encounter (HOSPITAL_COMMUNITY): Payer: Self-pay

## 2016-01-06 ENCOUNTER — Other Ambulatory Visit (HOSPITAL_BASED_OUTPATIENT_CLINIC_OR_DEPARTMENT_OTHER): Payer: Medicare Other

## 2016-01-06 ENCOUNTER — Ambulatory Visit: Payer: Medicare Other

## 2016-01-06 ENCOUNTER — Ambulatory Visit (HOSPITAL_COMMUNITY)
Admission: RE | Admit: 2016-01-06 | Discharge: 2016-01-06 | Disposition: A | Discharge status: Home / Self Care | Payer: Medicare Other | Source: Ambulatory Visit | Attending: Oncology | Admitting: Oncology

## 2016-01-06 DIAGNOSIS — C7800 Secondary malignant neoplasm of unspecified lung: Secondary | ICD-10-CM

## 2016-01-06 DIAGNOSIS — Z951 Presence of aortocoronary bypass graft: Secondary | ICD-10-CM | POA: Insufficient documentation

## 2016-01-06 DIAGNOSIS — R590 Localized enlarged lymph nodes: Secondary | ICD-10-CM | POA: Insufficient documentation

## 2016-01-06 DIAGNOSIS — C689 Malignant neoplasm of urinary organ, unspecified: Secondary | ICD-10-CM | POA: Insufficient documentation

## 2016-01-06 DIAGNOSIS — N2889 Other specified disorders of kidney and ureter: Secondary | ICD-10-CM | POA: Diagnosis not present

## 2016-01-06 DIAGNOSIS — I2510 Atherosclerotic heart disease of native coronary artery without angina pectoris: Secondary | ICD-10-CM | POA: Diagnosis not present

## 2016-01-06 DIAGNOSIS — I700 Atherosclerosis of aorta: Secondary | ICD-10-CM | POA: Insufficient documentation

## 2016-01-06 DIAGNOSIS — R599 Enlarged lymph nodes, unspecified: Secondary | ICD-10-CM | POA: Diagnosis not present

## 2016-01-06 DIAGNOSIS — Z95828 Presence of other vascular implants and grafts: Secondary | ICD-10-CM

## 2016-01-06 LAB — CBC WITH DIFFERENTIAL/PLATELET
BASO%: 0.7 % (ref 0.0–2.0)
BASOS ABS: 0 10*3/uL (ref 0.0–0.1)
EOS%: 2.7 % (ref 0.0–7.0)
Eosinophils Absolute: 0.1 10*3/uL (ref 0.0–0.5)
HCT: 37.8 % — ABNORMAL LOW (ref 38.4–49.9)
HEMOGLOBIN: 12.6 g/dL — AB (ref 13.0–17.1)
LYMPH%: 24.2 % (ref 14.0–49.0)
MCH: 33.1 pg (ref 27.2–33.4)
MCHC: 33.3 g/dL (ref 32.0–36.0)
MCV: 99.4 fL — ABNORMAL HIGH (ref 79.3–98.0)
MONO#: 0.8 10*3/uL (ref 0.1–0.9)
MONO%: 16.7 % — AB (ref 0.0–14.0)
NEUT#: 2.8 10*3/uL (ref 1.5–6.5)
NEUT%: 55.7 % (ref 39.0–75.0)
Platelets: 129 10*3/uL — ABNORMAL LOW (ref 140–400)
RBC: 3.8 10*6/uL — ABNORMAL LOW (ref 4.20–5.82)
RDW: 12.6 % (ref 11.0–14.6)
WBC: 4.9 10*3/uL (ref 4.0–10.3)
lymph#: 1.2 10*3/uL (ref 0.9–3.3)

## 2016-01-06 LAB — COMPREHENSIVE METABOLIC PANEL
ALT: 21 U/L (ref 0–55)
AST: 25 U/L (ref 5–34)
Albumin: 3.6 g/dL (ref 3.5–5.0)
Alkaline Phosphatase: 82 U/L (ref 40–150)
Anion Gap: 8 mEq/L (ref 3–11)
BUN: 22.5 mg/dL (ref 7.0–26.0)
CHLORIDE: 104 meq/L (ref 98–109)
CO2: 26 meq/L (ref 22–29)
Calcium: 9.3 mg/dL (ref 8.4–10.4)
Creatinine: 1 mg/dL (ref 0.7–1.3)
EGFR: 71 mL/min/{1.73_m2} — ABNORMAL LOW (ref >90)
GLUCOSE: 88 mg/dL (ref 70–140)
POTASSIUM: 4.2 meq/L (ref 3.5–5.1)
SODIUM: 139 meq/L (ref 136–145)
Total Bilirubin: 0.74 mg/dL (ref 0.20–1.20)
Total Protein: 6.3 g/dL — ABNORMAL LOW (ref 6.4–8.3)

## 2016-01-06 MED ORDER — HEPARIN SOD (PORK) LOCK FLUSH 100 UNIT/ML IV SOLN
500.0000 [IU] | Freq: Once | INTRAVENOUS | Status: AC
  Administered 2016-01-06: 500 [IU] via INTRAVENOUS
  Filled 2016-01-06: qty 5

## 2016-01-06 MED ORDER — IOPAMIDOL (ISOVUE-300) INJECTION 61%
150.0000 mL | Freq: Once | INTRAVENOUS | Status: AC | PRN
  Administered 2016-01-06: 125 mL via INTRAVENOUS

## 2016-01-06 MED ORDER — SODIUM CHLORIDE 0.9% FLUSH
10.0000 mL | INTRAVENOUS | Status: DC | PRN
  Administered 2016-01-06: 10 mL via INTRAVENOUS
  Filled 2016-01-06: qty 10

## 2016-01-06 NOTE — Patient Instructions (Signed)

## 2016-01-08 ENCOUNTER — Ambulatory Visit (HOSPITAL_BASED_OUTPATIENT_CLINIC_OR_DEPARTMENT_OTHER): Payer: Medicare Other | Admitting: Oncology

## 2016-01-08 ENCOUNTER — Telehealth: Payer: Self-pay | Admitting: Oncology

## 2016-01-08 ENCOUNTER — Telehealth: Payer: Self-pay | Admitting: *Deleted

## 2016-01-08 VITALS — BP 124/57 | HR 72 | Temp 97.8°F | Resp 18 | Ht 69.0 in | Wt 183.0 lb

## 2016-01-08 DIAGNOSIS — C689 Malignant neoplasm of urinary organ, unspecified: Secondary | ICD-10-CM | POA: Diagnosis not present

## 2016-01-08 DIAGNOSIS — C7800 Secondary malignant neoplasm of unspecified lung: Secondary | ICD-10-CM | POA: Diagnosis not present

## 2016-01-08 NOTE — Progress Notes (Signed)
Hematology and Oncology Follow Up Visit  [name] [name] [id number] 1945/08/18 70 y.o. 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 He presented with 4.9 cm mass of the upper pole of the left kidney with documented pulmonary metastasis. Neurological confirmation done on 04/15/2015 with a biopsy showed urothelial carcinoma.   Prior Therapy:   Status post biopsy of the left renal mass done on 04/15/2015. He is status post Port-A-Cath insertion on 05/21/2015. Systemic chemotherapy in the form of cisplatin and gemcitabine started on 05/27/2015. He is status post 6 cycles completed on 09/16/2015.  Current therapy: Observation and surveillance.  Interim History:  [name] [name] presents today for a follow-up visit. Since the last visit, he reports feeling very well without any recent complaints. He continues to enjoy excellent quality of life without any delayed complications related to chemotherapy. He does have faint sensory neuropathy in his upper extremities which have not progressed. He reports no respiratory symptoms at this time including no cough, dyspnea or hemoptysis. He remains active and exercises regularly.   He does not report any headaches, blurry vision, syncope or seizures. He does not report any fevers, chills, sweats with loss he does report some appetite changes. He does not report any chest pain, palpitation, orthopnea or leg edema. He does not report any wheezing or hemoptysis. He does report chronic cough. He does not report any chest pain, palpitation, orthopnea, leg edema. He does not report any  vomiting, abdominal pain does report occasional dyspepsia. He does not report any frequency, urgency or hesitancy. He does not report any skeletal complaints. Remaining review of systems unremarkable.   Medications: I have reviewed the patient's  current medications.  Current Outpatient Prescriptions  Medication Sig Dispense Refill  . acetaminophen (TYLENOL) 325 MG tablet Take 650 mg by mouth every 6 (six) hours as needed for moderate pain or headache.     [name] atorvastatin (LIPITOR) 40 MG tablet Take 1 tablet (40 mg total) by mouth daily. 90 tablet 3  . Cholecalciferol (VITAMIN D) 1000 UNITS capsule Take 1,000 Units by mouth daily.      . diphenhydrAMINE (SOMINEX) 25 MG tablet Take 25 mg by mouth at bedtime.    . docusate sodium (COLACE) 100 MG capsule Take 1 capsule (100 mg total) by mouth 2 (two) times daily. (Patient taking differently: Take 100 mg by mouth daily as needed for mild constipation. ) 10 capsule 0  . lidocaine-prilocaine (EMLA) cream Apply to port-a-cath 1-2 hours prior to acces. Cover with saran wrap. 30 g 1  . lisinopril (PRINIVIL,ZESTRIL) 5 MG tablet Take 1 tablet (5 mg total) by mouth daily. (Patient taking differently: Take 5 mg by mouth every evening. ) 90 tablet 3  . metoprolol tartrate (LOPRESSOR) 25 MG tablet Take 0.5 tablets (12.5 mg total) by mouth 2 (two) times daily. 90 tablet 3  . omeprazole (PRILOSEC) 20 MG capsule Take 20 mg by mouth daily.    . polycarbophil (FIBERCON) 625 MG tablet Take 625 mg by mouth daily.     . sildenafil (VIAGRA) 50 MG tablet Take 50 mg by mouth as needed for erectile dysfunction.     . nitroGLYCERIN (NITROSTAT) 0.4 MG SL tablet Place 1 tablet (0.4 mg total) under the tongue every 5 (five) minutes as needed. May repeat up to 3 doses. (Patient not taking: Reported on 01/08/2016) 100 tablet 3   No current facility-administered medications  for this visit.     Allergies:  Allergies  Allergen Reactions  . Hydrocodone Other (See Comments)    Became too sedated    Past Medical History, Surgical history, Social history, and Family History were reviewed and updated.   Physical Exam: Blood pressure 124/57, pulse 72, temperature 97.8 F (36.6 C), temperature source Oral, resp. rate 18,  height 5\' 9"  (1.753 m), weight 183 lb (83.008 kg), SpO2 100 %. ECOG: 0 General appearance: Alert, awake gentleman without distress. Head: Normocephalic, without obvious abnormality no oral thrush or ulcers. Neck: no adenopathy Lymph nodes: Cervical, supraclavicular, and axillary nodes normal. Heart:regular rate and rhythm, S1, S2 normal, no murmur, click, rub or gallop Lung:chest clear, no wheezing, rales, normal symmetric air entry Abdomin: soft, non-tender, without masses or organomegaly no shifting dullness or ascites. EXT:no erythema, induration, or nodules   Lab Results: Lab Results  Component Value Date   WBC 4.9 01/06/2016   HGB 12.6* 01/06/2016   HCT 37.8* 01/06/2016   MCV 99.4* 01/06/2016   PLT 129* 01/06/2016     Chemistry      Component Value Date/Time   NA 139 01/06/2016 0917   NA 136 05/21/2015 0840   K 4.2 01/06/2016 0917   K 4.1 05/21/2015 0840   CL 104 05/21/2015 0840   CO2 26 01/06/2016 0917   CO2 25 05/21/2015 0840   BUN 22.5 01/06/2016 0917   BUN 18 05/21/2015 0840   CREATININE 1.0 01/06/2016 0917   CREATININE 1.04 05/21/2015 0840      Component Value Date/Time   CALCIUM 9.3 01/06/2016 0917   CALCIUM 9.4 05/21/2015 0840   ALKPHOS 82 01/06/2016 0917   ALKPHOS 82 03/05/2015 0831   AST 25 01/06/2016 0917   AST 19 03/05/2015 0831   ALT 21 01/06/2016 0917   ALT 17 03/05/2015 0831   BILITOT 0.74 01/06/2016 0917   BILITOT 0.8 03/05/2015 0831     EXAM: CT CHEST, ABDOMEN, AND PELVIS WITH CONTRAST  TECHNIQUE: Multidetector CT imaging of the chest, abdomen and pelvis was performed following the standard protocol during bolus administration of intravenous contrast.  CONTRAST: 190mL ISOVUE-300 IOPAMIDOL (ISOVUE-300) INJECTION 61%  COMPARISON: CT the chest, abdomen and pelvis 10/07/2015.  FINDINGS: CT CHEST FINDINGS  Mediastinum/Lymph Nodes: Heart size is normal. There is no significant pericardial fluid, thickening or  pericardial calcification. There is aortic atherosclerosis, as well as atherosclerosis of the great vessels of the mediastinum and the coronary arteries, including calcified atherosclerotic plaque in the left main, left anterior descending, left circumflex and right coronary arteries. Status post median sternotomy for CABG, including LIMA to the LAD. No pathologically enlarged mediastinal or hilar lymph nodes. Esophagus is unremarkable in appearance. No axillary lymphadenopathy. Right internal jugular single-lumen porta cath with tip terminating in the right atrium.  Lungs/Pleura: Multiple pulmonary nodules are again noted throughout the lungs bilaterally, which generally appear stable to decreased compared to the prior examination. Index nodule in the left lower lobe seen on prior studies no longer exists, with only a small linear opacity in its wake (image 121 of series 4). The largest current nodule is a subpleural nodule in the periphery of the left upper lobe near the apex (image 24 of series 4), which is unchanged compared to prior examination measuring 10 x 8 mm. No definite new nodules. No acute consolidative airspace disease. No pleural effusions. Patchy areas of mild linear scarring are noted throughout the lungs bilaterally, predominantly in the lung bases.  Musculoskeletal/Soft Tissues: Median sternotomy wires.  There are no aggressive appearing lytic or blastic lesions noted in the visualized portions of the skeleton.  CT ABDOMEN AND PELVIS FINDINGS  Hepatobiliary: Well-defined 2.0 x 1.9 cm low-attenuation lesion in segment 4A of the liver is similar to the prior study, compatible with a simple cyst. No suspicious hepatic lesions. No intra or extrahepatic biliary ductal dilatation. Gallbladder is normal in appearance.  Pancreas: No pancreatic mass. No pancreatic ductal dilatation. No pancreatic or peripancreatic fluid or inflammatory changes.  Spleen:  Unremarkable.  Adrenals/Urinary Tract: The previously described ill-defined infiltrative mass in the upper pole of the left kidney appears slightly larger than the prior examination. This lesion is difficult to discretely measure, but it appears best demonstrated on delayed image 30 of series 6 where it measures approximately 3.1 x 2.1 cm (previously 2.2 x 1.6 cm on prior examination 10/07/2015). Right kidney and bilateral adrenal glands are normal in appearance. No hydroureteronephrosis. No additional filling defects are noted within the collecting system of the right kidney, along the course of either ureter or within the lumen of the urinary bladder. Urinary bladder is grossly unremarkable in appearance.  Stomach/Bowel: The appearance of the stomach is normal. No pathologic dilatation of small bowel or colon. Normal appendix.  Vascular/Lymphatic: Aortic atherosclerosis, in addition to atherosclerotic disease throughout other abdominal and pelvic vasculature, without evidence of aneurysm or dissection. Enhancing left periaortic lymph node has significantly increased in size compared to the prior study, currently measuring 1.9 cm in short axis (image 68 of series 2). No other definite lymphadenopathy in the abdomen or pelvis.  Reproductive: Prostate gland and seminal vesicles are unremarkable in appearance.  Other: No significant volume of ascites. No pneumoperitoneum.  Musculoskeletal: There are no aggressive appearing lytic or blastic lesions noted in the visualized portions of the skeleton.  IMPRESSION: 1. Interval enlargement of the previously noted infiltrative mass in the upper pole the left kidney, with enlarging left para-aortic lymphadenopathy, as above. 2. Previously noted metastatic pulmonary nodules all appear stable compared to prior examination, as discussed above. 3. Aortic atherosclerosis, in addition to left main and 3 vessel coronary artery disease.  Status post median sternotomy for CABG, including LIMA to the LAD. 4. Additional incidental findings, as above.   Impression and Plan:   70 year old gentleman with the following issues:  1. 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 He did have a pathological confirmation done on 04/15/2015 with the specimen showed urothelial carcinoma.  He is status post 6 cycles of therapy completed in March 2017. CT scan on 04/11/2017showed continuous response to therapy. He had further decrease in his bilateral pulmonary nodules as well as decrease in the left upper pole renal lesion.  Repeat CT scan on 01/06/2016 was reviewed today and showed mild progression of disease especially in his primary tumor. His pulmonary nodules have remained stable.   Options of therapy were reviewed today with the patient and his wife which include continued observation and surveillance versus using salvage immunotherapy. Risks and benefits of Pembrolizumab as a salvage agent given at 200 mg every 3 weeks was reviewed. Complications include immune mediated side effects that include pneumonitis, thyroiditis and dermatitis were discussed. The benefit would be a objective response rate in the 20-30% range and improvement in overall survival.  After discussion he is agreeable to proceed with this therapy after a trip  to [facility] that he is planned. I anticipate the start of therapy in the third week of August based on his request.   2. Nausea and GI complication prophylaxis: He is no longer reporting nausea or vomiting. Antiemetics are available for him in case he developed complications associated with this medication.  3. IV access: Port-A-Cath in place without any complications. This will be utilized for this therapy.  4. Renal function prophylaxis: Creatinine  remains stable.  5. Cough: Likely related to his pulmonary metastasis. Resolved at this point.  6. Follow-up: Will be in 4 weeks to start of therapy.  [facility], MD 7/13/20174:54 PM

## 2016-01-08 NOTE — Telephone Encounter (Signed)
per pof to sch pt appt-gave pt copy of avs °

## 2016-01-08 NOTE — Telephone Encounter (Signed)
Per staff phone call and POF I have schedueld appts. Scheduler advised of appts.  JMW  

## 2016-02-13 ENCOUNTER — Telehealth: Payer: Self-pay | Admitting: Oncology

## 2016-02-13 ENCOUNTER — Ambulatory Visit (HOSPITAL_BASED_OUTPATIENT_CLINIC_OR_DEPARTMENT_OTHER): Payer: Medicare Other

## 2016-02-13 ENCOUNTER — Ambulatory Visit (HOSPITAL_BASED_OUTPATIENT_CLINIC_OR_DEPARTMENT_OTHER): Payer: Medicare Other | Admitting: Oncology

## 2016-02-13 ENCOUNTER — Ambulatory Visit: Payer: Medicare Other

## 2016-02-13 ENCOUNTER — Other Ambulatory Visit (HOSPITAL_BASED_OUTPATIENT_CLINIC_OR_DEPARTMENT_OTHER): Payer: Medicare Other

## 2016-02-13 VITALS — BP 131/61 | HR 60 | Temp 97.8°F | Resp 18 | Ht 69.0 in | Wt 184.1 lb

## 2016-02-13 DIAGNOSIS — C7800 Secondary malignant neoplasm of unspecified lung: Secondary | ICD-10-CM

## 2016-02-13 DIAGNOSIS — R05 Cough: Secondary | ICD-10-CM | POA: Diagnosis not present

## 2016-02-13 DIAGNOSIS — C689 Malignant neoplasm of urinary organ, unspecified: Secondary | ICD-10-CM

## 2016-02-13 DIAGNOSIS — Z95828 Presence of other vascular implants and grafts: Secondary | ICD-10-CM

## 2016-02-13 DIAGNOSIS — Z5112 Encounter for antineoplastic immunotherapy: Secondary | ICD-10-CM

## 2016-02-13 LAB — CBC WITH DIFFERENTIAL/PLATELET
BASO%: 0.8 % (ref 0.0–2.0)
BASOS ABS: 0 10*3/uL (ref 0.0–0.1)
EOS ABS: 0.2 10*3/uL (ref 0.0–0.5)
EOS%: 3.7 % (ref 0.0–7.0)
HEMATOCRIT: 38.8 % (ref 38.4–49.9)
HEMOGLOBIN: 13 g/dL (ref 13.0–17.1)
LYMPH#: 1.1 10*3/uL (ref 0.9–3.3)
LYMPH%: 20.3 % (ref 14.0–49.0)
MCH: 32.5 pg (ref 27.2–33.4)
MCHC: 33.4 g/dL (ref 32.0–36.0)
MCV: 97.2 fL (ref 79.3–98.0)
MONO#: 0.8 10*3/uL (ref 0.1–0.9)
MONO%: 14 % (ref 0.0–14.0)
NEUT#: 3.4 10*3/uL (ref 1.5–6.5)
NEUT%: 61.2 % (ref 39.0–75.0)
PLATELETS: 151 10*3/uL (ref 140–400)
RBC: 3.99 10*6/uL — ABNORMAL LOW (ref 4.20–5.82)
RDW: 12.6 % (ref 11.0–14.6)
WBC: 5.5 10*3/uL (ref 4.0–10.3)

## 2016-02-13 LAB — COMPREHENSIVE METABOLIC PANEL
ALBUMIN: 3.5 g/dL (ref 3.5–5.0)
ALK PHOS: 80 U/L (ref 40–150)
ALT: 21 U/L (ref 0–55)
ANION GAP: 7 meq/L (ref 3–11)
AST: 26 U/L (ref 5–34)
BUN: 23 mg/dL (ref 7.0–26.0)
CALCIUM: 9.5 mg/dL (ref 8.4–10.4)
CHLORIDE: 104 meq/L (ref 98–109)
CO2: 27 mEq/L (ref 22–29)
Creatinine: 1.2 mg/dL (ref 0.7–1.3)
EGFR: 61 mL/min/{1.73_m2} — ABNORMAL LOW (ref >90)
Glucose: 121 mg/dl (ref 70–140)
POTASSIUM: 4.4 meq/L (ref 3.5–5.1)
Sodium: 138 mEq/L (ref 136–145)
Total Bilirubin: 0.95 mg/dL (ref 0.20–1.20)
Total Protein: 6.5 g/dL (ref 6.4–8.3)

## 2016-02-13 MED ORDER — SODIUM CHLORIDE 0.9% FLUSH
10.0000 mL | INTRAVENOUS | Status: DC | PRN
  Administered 2016-02-13: 10 mL
  Filled 2016-02-13: qty 10

## 2016-02-13 MED ORDER — HEPARIN SOD (PORK) LOCK FLUSH 100 UNIT/ML IV SOLN
500.0000 [IU] | Freq: Once | INTRAVENOUS | Status: AC | PRN
  Administered 2016-02-13: 500 [IU]
  Filled 2016-02-13: qty 5

## 2016-02-13 MED ORDER — PEMBROLIZUMAB CHEMO INJECTION 100 MG/4ML
200.0000 mg | Freq: Once | INTRAVENOUS | Status: AC
  Administered 2016-02-13: 200 mg via INTRAVENOUS
  Filled 2016-02-13: qty 8

## 2016-02-13 MED ORDER — SODIUM CHLORIDE 0.9 % IJ SOLN
10.0000 mL | INTRAMUSCULAR | Status: DC | PRN
  Administered 2016-02-13: 10 mL via INTRAVENOUS
  Filled 2016-02-13: qty 10

## 2016-02-13 MED ORDER — SODIUM CHLORIDE 0.9 % IV SOLN
Freq: Once | INTRAVENOUS | Status: AC
  Administered 2016-02-13: 11:00:00 via INTRAVENOUS

## 2016-02-13 NOTE — Patient Instructions (Signed)

## 2016-02-13 NOTE — Patient Instructions (Signed)
[facility] Discharge Instructions for Patients Receiving Chemotherapy  Today you received the following chemotherapy agents:  Keytruda  To help prevent nausea and vomiting after your treatment, we encourage you to take your nausea medication as ordered per MD.   If you develop nausea and vomiting that is not controlled by your nausea medication, call the clinic.   BELOW ARE SYMPTOMS THAT SHOULD BE REPORTED IMMEDIATELY:  *FEVER GREATER THAN 100.5 F  *CHILLS WITH OR WITHOUT FEVER  NAUSEA AND VOMITING THAT IS NOT CONTROLLED WITH YOUR NAUSEA MEDICATION  *UNUSUAL SHORTNESS OF BREATH  *UNUSUAL BRUISING OR BLEEDING  TENDERNESS IN MOUTH AND THROAT WITH OR WITHOUT PRESENCE OF ULCERS  *URINARY PROBLEMS  *BOWEL PROBLEMS  UNUSUAL RASH Items with * indicate a potential emergency and should be followed up as soon as possible.  Feel free to call the clinic you have any questions or concerns. The clinic phone number is [phone].  Please show the CHEMO ALERT CARD at check-in to the Emergency Department and triage nurse.   

## 2016-02-13 NOTE — Telephone Encounter (Signed)
Gave pt cal & avs °

## 2016-02-13 NOTE — Progress Notes (Signed)
Hematology and Oncology Follow Up Visit  [name] [name] [id number] March 25, 1946 70 y.o. 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 He presented with 4.9 cm mass of the upper pole of the left kidney with documented pulmonary metastasis. Neurological confirmation done on 04/15/2015 with a biopsy showed urothelial carcinoma.   Prior Therapy:   Status post biopsy of the left renal mass done on 04/15/2015. He is status post Port-A-Cath insertion on 05/21/2015. Systemic chemotherapy in the form of cisplatin and gemcitabine started on 05/27/2015. He is status post 6 cycles completed on 09/16/2015. CT scan on 01/06/2016 showed progression of disease.  Current therapy: Pembrolizumab 200 mg every 3 weeks cycle 1 on 02/13/2016.  Interim History:  [name] [name] presents today for a follow-up visit. Since the last visit, he reports no recent complaints. He traveled recently to [facility] for vacation and enjoyed his time off. He denied any respiratory symptoms at this time including cough, wheezing or hemoptysis. He denied any back pain or shoulder pain. He denied any hematuria or pelvic pain.   He continues to enjoy excellent quality of life without any delayed complications related to chemotherapy. He does have faint sensory neuropathy in his upper extremities which is improving but does have persistent sensory lower extremity neuropathy. Despite these symptoms he continues to ambulate without any major difficulties and attends to activities of daily living.   He does not report any headaches, blurry vision, syncope or seizures. He does not report any fevers, chills, sweats with loss he does report some appetite changes. He does not report any chest pain, palpitation, orthopnea or leg edema. He does not report any wheezing or hemoptysis. He does not report any  vomiting,  abdominal pain does report occasional dyspepsia. He does not report any frequency, urgency or hesitancy. He does not report any skeletal complaints. Remaining review of systems unremarkable.   Medications: I have reviewed the patient's current medications.  Current Outpatient Prescriptions  Medication Sig Dispense Refill  . acetaminophen (TYLENOL) 325 MG tablet Take 650 mg by mouth every 6 (six) hours as needed for moderate pain or headache.     [name] atorvastatin (LIPITOR) 40 MG tablet Take 1 tablet (40 mg total) by mouth daily. 90 tablet 3  . Cholecalciferol (VITAMIN D) 1000 UNITS capsule Take 1,000 Units by mouth daily.      . diphenhydrAMINE (SOMINEX) 25 MG tablet Take 25 mg by mouth at bedtime.    . docusate sodium (COLACE) 100 MG capsule Take 1 capsule (100 mg total) by mouth 2 (two) times daily. (Patient taking differently: Take 100 mg by mouth daily as needed for mild constipation. ) 10 capsule 0  . lidocaine-prilocaine (EMLA) cream Apply to port-a-cath 1-2 hours prior to acces. Cover with saran wrap. 30 g 1  . lisinopril (PRINIVIL,ZESTRIL) 5 MG tablet Take 1 tablet (5 mg total) by mouth daily. (Patient taking differently: Take 5 mg by mouth every evening. ) 90 tablet 3  . metoprolol tartrate (LOPRESSOR) 25 MG tablet Take 0.5 tablets (12.5 mg total) by mouth 2 (two) times daily. 90 tablet 3  . nitroGLYCERIN (NITROSTAT) 0.4 MG SL tablet Place 1 tablet (0.4 mg total) under the tongue every 5 (five) minutes as needed. May repeat up to 3 doses. 100 tablet 3  . polycarbophil (FIBERCON) 625 MG tablet Take 625 mg by mouth daily.     . ranitidine (ZANTAC) 300  MG capsule Take 300 mg by mouth every evening.    . sildenafil (VIAGRA) 50 MG tablet Take 50 mg by mouth as needed for erectile dysfunction.      No current facility-administered medications for this visit.      Allergies:  Allergies  Allergen Reactions  . Hydrocodone Other (See Comments)    Became too sedated    Past Medical History,  Surgical history, Social history, and Family History were reviewed and updated.   Physical Exam: Blood pressure 131/61, pulse 60, temperature 97.8 F (36.6 C), temperature source Oral, resp. rate 18, height 5\' 9"  (1.753 m), weight 184 lb 1.6 oz (83.5 kg), SpO2 98 %. ECOG: 0 General appearance: Well-appearing gentleman appeared without distress. Head: Normocephalic, without obvious abnormality no oral thrush noted. Neck: no adenopathy Lymph nodes: Cervical, supraclavicular, and axillary nodes normal. Heart:regular rate and rhythm, S1, S2 normal, no murmur, click, rub or gallop Lung:chest clear, no wheezing, rales, normal symmetric air entry Abdomin: soft, non-tender, without masses or organomegaly no shifting dullness or ascites. EXT:no erythema, induration, or nodules   Lab Results: Lab Results  Component Value Date   WBC 5.5 02/13/2016   HGB 13.0 02/13/2016   HCT 38.8 02/13/2016   MCV 97.2 02/13/2016   PLT 151 02/13/2016     Chemistry      Component Value Date/Time   NA 139 01/06/2016 0917   K 4.2 01/06/2016 0917   CL 104 05/21/2015 0840   CO2 26 01/06/2016 0917   BUN 22.5 01/06/2016 0917   CREATININE 1.0 01/06/2016 0917      Component Value Date/Time   CALCIUM 9.3 01/06/2016 0917   ALKPHOS 82 01/06/2016 0917   AST 25 01/06/2016 0917   ALT 21 01/06/2016 0917   BILITOT 0.74 01/06/2016 0917     EXAM: CT CHEST, ABDOMEN, AND PELVIS WITH CONTRAST  TECHNIQUE: Multidetector CT imaging of the chest, abdomen and pelvis was performed following the standard protocol during bolus administration of intravenous contrast.  CONTRAST: 130mL ISOVUE-300 IOPAMIDOL (ISOVUE-300) INJECTION 61%  COMPARISON: CT the chest, abdomen and pelvis 10/07/2015.  FINDINGS: CT CHEST FINDINGS  Mediastinum/Lymph Nodes: Heart size is normal. There is no significant pericardial fluid, thickening or pericardial calcification. There is aortic atherosclerosis, as well as atherosclerosis  of the great vessels of the mediastinum and the coronary arteries, including calcified atherosclerotic plaque in the left main, left anterior descending, left circumflex and right coronary arteries. Status post median sternotomy for CABG, including LIMA to the LAD. No pathologically enlarged mediastinal or hilar lymph nodes. Esophagus is unremarkable in appearance. No axillary lymphadenopathy. Right internal jugular single-lumen porta cath with tip terminating in the right atrium.  Lungs/Pleura: Multiple pulmonary nodules are again noted throughout the lungs bilaterally, which generally appear stable to decreased compared to the prior examination. Index nodule in the left lower lobe seen on prior studies no longer exists, with only a small linear opacity in its wake (image 121 of series 4). The largest current nodule is a subpleural nodule in the periphery of the left upper lobe near the apex (image 24 of series 4), which is unchanged compared to prior examination measuring 10 x 8 mm. No definite new nodules. No acute consolidative airspace disease. No pleural effusions. Patchy areas of mild linear scarring are noted throughout the lungs bilaterally, predominantly in the lung bases.  Musculoskeletal/Soft Tissues: Median sternotomy wires. There are no aggressive appearing lytic or blastic lesions noted in the visualized portions of the skeleton.  CT ABDOMEN AND  PELVIS FINDINGS  Hepatobiliary: Well-defined 2.0 x 1.9 cm low-attenuation lesion in segment 4A of the liver is similar to the prior study, compatible with a simple cyst. No suspicious hepatic lesions. No intra or extrahepatic biliary ductal dilatation. Gallbladder is normal in appearance.  Pancreas: No pancreatic mass. No pancreatic ductal dilatation. No pancreatic or peripancreatic fluid or inflammatory changes.  Spleen: Unremarkable.  Adrenals/Urinary Tract: The previously described ill-defined infiltrative mass  in the upper pole of the left kidney appears slightly larger than the prior examination. This lesion is difficult to discretely measure, but it appears best demonstrated on delayed image 30 of series 6 where it measures approximately 3.1 x 2.1 cm (previously 2.2 x 1.6 cm on prior examination 10/07/2015). Right kidney and bilateral adrenal glands are normal in appearance. No hydroureteronephrosis. No additional filling defects are noted within the collecting system of the right kidney, along the course of either ureter or within the lumen of the urinary bladder. Urinary bladder is grossly unremarkable in appearance.  Stomach/Bowel: The appearance of the stomach is normal. No pathologic dilatation of small bowel or colon. Normal appendix.  Vascular/Lymphatic: Aortic atherosclerosis, in addition to atherosclerotic disease throughout other abdominal and pelvic vasculature, without evidence of aneurysm or dissection. Enhancing left periaortic lymph node has significantly increased in size compared to the prior study, currently measuring 1.9 cm in short axis (image 68 of series 2). No other definite lymphadenopathy in the abdomen or pelvis.  Reproductive: Prostate gland and seminal vesicles are unremarkable in appearance.  Other: No significant volume of ascites. No pneumoperitoneum.  Musculoskeletal: There are no aggressive appearing lytic or blastic lesions noted in the visualized portions of the skeleton.  IMPRESSION: 1. Interval enlargement of the previously noted infiltrative mass in the upper pole the left kidney, with enlarging left para-aortic lymphadenopathy, as above. 2. Previously noted metastatic pulmonary nodules all appear stable compared to prior examination, as discussed above. 3. Aortic atherosclerosis, in addition to left main and 3 vessel coronary artery disease. Status post median sternotomy for CABG, including LIMA to the LAD. 4. Additional incidental  findings, as above.   Impression and Plan:   70 year old gentleman with the following issues:  1. 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 He did have a pathological confirmation done on 04/15/2015 with the specimen showed urothelial carcinoma.  He is status post 6 cycles of therapy completed in March 2017. CT scan on 04/11/2017showed continuous response to therapy. He had further decrease in his bilateral pulmonary nodules as well as decrease in the left upper pole renal lesion.  Repeat CT scan on 01/06/2016 was reviewed again and discussed with the patient. I scan showed mild progression of disease especially in his primary tumor. His pulmonary nodules have remained stable.   He is ready to proceed with salvage therapy utilizing Pembrolizumab. Risks and benefits of this medication was reviewed again and potential immune complications were discussed in detail. These complications include pneumonitis, thyroid disease and dermatitis. The benefit would be response rate and disease control. All his questions were answered and will receive cycle one today without delay.   2. Nausea and GI complication prophylaxis: He is no longer reporting nausea or vomiting. Antiemetics are available for him in case he developed with the new regimen.  3. IV access: Port-A-Cath was accessed to start therapy today. No complications noted.  4. Renal function prophylaxis: Creatinine remains stable.  5. Cough: Likely related to his pulmonary metastasis. He is no longer reporting this issue.  6. Autoimmune surveillance: Thyroid function will be checked periodically.  7. Follow-up: Will be in 3 weeks for the next cycle of therapy.  [facility] [place], MD 8/18/20179:57 AM

## 2016-02-16 ENCOUNTER — Telehealth: Payer: Self-pay | Admitting: *Deleted

## 2016-02-16 NOTE — Telephone Encounter (Signed)
Called Lucianne Lei for chemotherapy F/U.  Patient is doing well.  Denies n/v.  Denies any new side effects or symptoms.  Bowel and bladder is functioning well.  Eating and drinking well and I instructed to drink 64 oz minimum daily or at least the day before, of and after treatment.  "This sure is better than what I was receiving, I feel fine."  Denies questions at this time and encouraged to call if needed.  Reviewed how to call after hours in the case of an emergency.

## 2016-03-05 ENCOUNTER — Ambulatory Visit (HOSPITAL_BASED_OUTPATIENT_CLINIC_OR_DEPARTMENT_OTHER): Payer: Medicare Other

## 2016-03-05 ENCOUNTER — Other Ambulatory Visit (HOSPITAL_BASED_OUTPATIENT_CLINIC_OR_DEPARTMENT_OTHER): Payer: Medicare Other

## 2016-03-05 ENCOUNTER — Ambulatory Visit (HOSPITAL_BASED_OUTPATIENT_CLINIC_OR_DEPARTMENT_OTHER): Payer: Medicare Other | Admitting: Oncology

## 2016-03-05 ENCOUNTER — Telehealth: Payer: Self-pay | Admitting: Oncology

## 2016-03-05 ENCOUNTER — Ambulatory Visit: Payer: Medicare Other

## 2016-03-05 VITALS — BP 120/69 | HR 63 | Temp 98.1°F | Resp 18 | Ht 69.0 in | Wt 185.5 lb

## 2016-03-05 DIAGNOSIS — C689 Malignant neoplasm of urinary organ, unspecified: Secondary | ICD-10-CM

## 2016-03-05 DIAGNOSIS — Z95828 Presence of other vascular implants and grafts: Secondary | ICD-10-CM

## 2016-03-05 DIAGNOSIS — Z79899 Other long term (current) drug therapy: Secondary | ICD-10-CM

## 2016-03-05 DIAGNOSIS — Z5112 Encounter for antineoplastic immunotherapy: Secondary | ICD-10-CM | POA: Diagnosis not present

## 2016-03-05 DIAGNOSIS — C7800 Secondary malignant neoplasm of unspecified lung: Secondary | ICD-10-CM | POA: Diagnosis not present

## 2016-03-05 LAB — CBC WITH DIFFERENTIAL/PLATELET
BASO%: 0.5 % (ref 0.0–2.0)
Basophils Absolute: 0 10*3/uL (ref 0.0–0.1)
EOS%: 7.1 % — ABNORMAL HIGH (ref 0.0–7.0)
Eosinophils Absolute: 0.4 10*3/uL (ref 0.0–0.5)
HEMATOCRIT: 35.5 % — AB (ref 38.4–49.9)
HGB: 12 g/dL — ABNORMAL LOW (ref 13.0–17.1)
LYMPH%: 16.8 % (ref 14.0–49.0)
MCH: 32.9 pg (ref 27.2–33.4)
MCHC: 33.8 g/dL (ref 32.0–36.0)
MCV: 97.3 fL (ref 79.3–98.0)
MONO#: 0.9 10*3/uL (ref 0.1–0.9)
MONO%: 16.3 % — ABNORMAL HIGH (ref 0.0–14.0)
NEUT%: 59.3 % (ref 39.0–75.0)
NEUTROS ABS: 3.3 10*3/uL (ref 1.5–6.5)
PLATELETS: 152 10*3/uL (ref 140–400)
RBC: 3.65 10*6/uL — AB (ref 4.20–5.82)
RDW: 12.4 % (ref 11.0–14.6)
WBC: 5.5 10*3/uL (ref 4.0–10.3)
lymph#: 0.9 10*3/uL (ref 0.9–3.3)

## 2016-03-05 LAB — COMPREHENSIVE METABOLIC PANEL
ALT: 17 U/L (ref 0–55)
ANION GAP: 8 meq/L (ref 3–11)
AST: 21 U/L (ref 5–34)
Albumin: 3.2 g/dL — ABNORMAL LOW (ref 3.5–5.0)
Alkaline Phosphatase: 89 U/L (ref 40–150)
BILIRUBIN TOTAL: 0.52 mg/dL (ref 0.20–1.20)
BUN: 23 mg/dL (ref 7.0–26.0)
CO2: 24 meq/L (ref 22–29)
CREATININE: 1.2 mg/dL (ref 0.7–1.3)
Calcium: 9 mg/dL (ref 8.4–10.4)
Chloride: 106 mEq/L (ref 98–109)
EGFR: 63 mL/min/{1.73_m2} — ABNORMAL LOW (ref >90)
Glucose: 132 mg/dl (ref 70–140)
Potassium: 4.2 mEq/L (ref 3.5–5.1)
Sodium: 139 mEq/L (ref 136–145)
TOTAL PROTEIN: 6.3 g/dL — AB (ref 6.4–8.3)

## 2016-03-05 LAB — TSH: TSH: 2.05 m(IU)/L (ref 0.320–4.118)

## 2016-03-05 MED ORDER — SODIUM CHLORIDE 0.9% FLUSH
10.0000 mL | INTRAVENOUS | Status: DC | PRN
  Administered 2016-03-05: 10 mL
  Filled 2016-03-05: qty 10

## 2016-03-05 MED ORDER — HEPARIN SOD (PORK) LOCK FLUSH 100 UNIT/ML IV SOLN
500.0000 [IU] | Freq: Once | INTRAVENOUS | Status: AC | PRN
  Administered 2016-03-05: 500 [IU]
  Filled 2016-03-05: qty 5

## 2016-03-05 MED ORDER — SODIUM CHLORIDE 0.9 % IJ SOLN
10.0000 mL | INTRAMUSCULAR | Status: DC | PRN
  Administered 2016-03-05: 10 mL via INTRAVENOUS
  Filled 2016-03-05: qty 10

## 2016-03-05 MED ORDER — SODIUM CHLORIDE 0.9 % IV SOLN
Freq: Once | INTRAVENOUS | Status: AC
  Administered 2016-03-05: 14:00:00 via INTRAVENOUS

## 2016-03-05 MED ORDER — SODIUM CHLORIDE 0.9 % IV SOLN
200.0000 mg | Freq: Once | INTRAVENOUS | Status: AC
  Administered 2016-03-05: 200 mg via INTRAVENOUS
  Filled 2016-03-05: qty 8

## 2016-03-05 NOTE — Patient Instructions (Signed)
[facility] Discharge Instructions for Patients Receiving Chemotherapy  Today you received the following chemotherapy agents: Keytruda   To help prevent nausea and vomiting after your treatment, we encourage you to take your nausea medication as directed    If you develop nausea and vomiting that is not controlled by your nausea medication, call the clinic.   BELOW ARE SYMPTOMS THAT SHOULD BE REPORTED IMMEDIATELY:  *FEVER GREATER THAN 100.5 F  *CHILLS WITH OR WITHOUT FEVER  NAUSEA AND VOMITING THAT IS NOT CONTROLLED WITH YOUR NAUSEA MEDICATION  *UNUSUAL SHORTNESS OF BREATH  *UNUSUAL BRUISING OR BLEEDING  TENDERNESS IN MOUTH AND THROAT WITH OR WITHOUT PRESENCE OF ULCERS  *URINARY PROBLEMS  *BOWEL PROBLEMS  UNUSUAL RASH Items with * indicate a potential emergency and should be followed up as soon as possible.  Feel free to call the clinic you have any questions or concerns. The clinic phone number is [phone].  Please show the CHEMO ALERT CARD at check-in to the Emergency Department and triage nurse.   

## 2016-03-05 NOTE — Telephone Encounter (Signed)
Gave patient avs report and appointments for September and October  °

## 2016-03-05 NOTE — Progress Notes (Signed)
Hematology and Oncology Follow Up Visit  [name] [name] [id number] 1946/05/18 70 y.o. 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 He presented with 4.9 cm mass of the upper pole of the left kidney with documented pulmonary metastasis. Neurological confirmation done on 04/15/2015 with a biopsy showed urothelial carcinoma.   Prior Therapy:   Status post biopsy of the left renal mass done on 04/15/2015. He is status post Port-A-Cath insertion on 05/21/2015. Systemic chemotherapy in the form of cisplatin and gemcitabine started on 05/27/2015. He is status post 6 cycles completed on 09/16/2015. CT scan on 01/06/2016 showed progression of disease.  Current therapy: Pembrolizumab 200 mg every 3 weeks cycle 1 on 02/13/2016. He is here for cycle 2 of therapy.  Interim History:  [name] presents today for a follow-up visit. Since the last visit, he received the first cycle of Pembrolizumab without any complications. He reported very mild pruritus and mild cough which have resolved at this time. He denied any nausea, abdominal pain or respiratory distress. His cough is not productive and have resolved at this time. He denied any wheezing or hemoptysis. He denied any back pain or shoulder pain. He denied any hematuria or pelvic pain.   He continues to have reasonable quality of life without any decline in his energy her performance status. His appetite remains excellent and have gained more weight since last visit.   He does not report any headaches, blurry vision, syncope or seizures. He does not report any fevers, chills, or sweats. He does not report any chest pain, palpitation, orthopnea or leg edema. He does not report any wheezing or hemoptysis. He does not report any  vomiting, abdominal pain does report occasional dyspepsia. He does not report any  frequency, urgency or hesitancy. He does not report any skeletal complaints. Remaining review of systems unremarkable.   Medications: I have reviewed the patient's current medications.  Current Outpatient Prescriptions  Medication Sig Dispense Refill  . acetaminophen (TYLENOL) 325 MG tablet Take 650 mg by mouth every 6 (six) hours as needed for moderate pain or headache.     [name] atorvastatin (LIPITOR) 40 MG tablet Take 1 tablet (40 mg total) by mouth daily. 90 tablet 3  . Cholecalciferol (VITAMIN D) 1000 UNITS capsule Take 1,000 Units by mouth daily.      . diphenhydrAMINE (SOMINEX) 25 MG tablet Take 25 mg by mouth at bedtime.    . docusate sodium (COLACE) 100 MG capsule Take 1 capsule (100 mg total) by mouth 2 (two) times daily. (Patient taking differently: Take 100 mg by mouth daily as needed for mild constipation. ) 10 capsule 0  . lidocaine-prilocaine (EMLA) cream Apply to port-a-cath 1-2 hours prior to acces. Cover with saran wrap. 30 g 1  . lisinopril (PRINIVIL,ZESTRIL) 5 MG tablet Take 1 tablet (5 mg total) by mouth daily. (Patient taking differently: Take 5 mg by mouth every evening. ) 90 tablet 3  . metoprolol tartrate (LOPRESSOR) 25 MG tablet Take 0.5 tablets (12.5 mg total) by mouth 2 (two) times daily. 90 tablet 3  . nitroGLYCERIN (NITROSTAT) 0.4 MG SL tablet Place 1 tablet (0.4 mg total) under the tongue every 5 (five) minutes as needed. May repeat up to 3 doses. 100 tablet 3  . polycarbophil (FIBERCON) 625 MG tablet Take 625 mg by mouth daily.     . ranitidine (ZANTAC) 300 MG capsule Take 300  mg by mouth every evening.    . sildenafil (VIAGRA) 50 MG tablet Take 50 mg by mouth as needed for erectile dysfunction.      No current facility-administered medications for this visit.      Allergies:  Allergies  Allergen Reactions  . Hydrocodone Other (See Comments)    Became too sedated    Past Medical History, Surgical history, Social history, and Family History were reviewed and  updated.   Physical Exam: Blood pressure 120/69, pulse 63, temperature 98.1 F (36.7 C), temperature source Oral, resp. rate 18, height 5\' 9"  (1.753 m), weight 185 lb 8 oz (84.1 kg), SpO2 99 %. ECOG: 0 General appearance: Alert, awake gentleman without distress. Head: Normocephalic, without obvious abnormality no oral thrush noted. Neck: no adenopathy Lymph nodes: Cervical, supraclavicular, and axillary nodes normal. Heart:regular rate and rhythm, S1, S2 normal, no murmur, click, rub or gallop Lung:chest clear, no wheezing, rales, normal symmetric air entry Abdomin: soft, non-tender, without masses or organomegaly no rebound or guarding. EXT:no erythema, induration, or nodules   Lab Results: Lab Results  Component Value Date   WBC 5.5 03/05/2016   HGB 12.0 (L) 03/05/2016   HCT 35.5 (L) 03/05/2016   MCV 97.3 03/05/2016   PLT 152 03/05/2016     Chemistry      Component Value Date/Time   NA 139 03/05/2016 1145   K 4.2 03/05/2016 1145   CL 104 05/21/2015 0840   CO2 24 03/05/2016 1145   BUN 23.0 03/05/2016 1145   CREATININE 1.2 03/05/2016 1145      Component Value Date/Time   CALCIUM 9.0 03/05/2016 1145   ALKPHOS 89 03/05/2016 1145   AST 21 03/05/2016 1145   ALT 17 03/05/2016 1145   BILITOT 0.52 03/05/2016 1145      Impression and Plan:   70 year old gentleman with the following issues:  1. 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 He did have a pathological confirmation done on 04/15/2015 with the specimen showed urothelial carcinoma.  He is status post 6 cycles of therapy completed in March 2017. CT scan on 04/11/2017showed continuous response to therapy. He had further decrease in his bilateral pulmonary nodules as well as decrease in the left upper pole renal lesion.  Repeat CT scan on 01/06/2016  showed mild progression of disease especially in his primary tumor. His pulmonary nodules have remained stable.   He is currently receiving Pembrolizumab as a salvage regimen given his progression of disease. He tolerated the first cycle without any major complications and ready to proceed with cycle 2. Plan is to repeat imaging studies after 3 cycles.   2. Nausea and GI complication prophylaxis: Antiemetics regimen is available to him as needed.  3. IV access: Port-A-Cath remained in use without any complications.  4. Renal function prophylaxis: Creatinine remains stable.  5. Cough: Likely related to his pulmonary metastasis. Appears to have resolved at this time.  6. Autoimmune surveillance: Thyroid function will be checked periodically. His TSH is currently pending.  7. Follow-up: Will be in 3 weeks for the next cycle of therapy.  Zola Button, [name] [name] 9/8/201712:50 PM

## 2016-03-26 ENCOUNTER — Ambulatory Visit (HOSPITAL_BASED_OUTPATIENT_CLINIC_OR_DEPARTMENT_OTHER): Payer: Medicare Other | Admitting: Oncology

## 2016-03-26 ENCOUNTER — Ambulatory Visit: Payer: Medicare Other

## 2016-03-26 ENCOUNTER — Ambulatory Visit (HOSPITAL_BASED_OUTPATIENT_CLINIC_OR_DEPARTMENT_OTHER): Payer: Medicare Other

## 2016-03-26 ENCOUNTER — Telehealth: Payer: Self-pay | Admitting: Oncology

## 2016-03-26 ENCOUNTER — Other Ambulatory Visit (HOSPITAL_BASED_OUTPATIENT_CLINIC_OR_DEPARTMENT_OTHER): Payer: Medicare Other

## 2016-03-26 VITALS — BP 115/66 | HR 56 | Temp 98.2°F | Resp 18 | Ht 69.0 in | Wt 185.1 lb

## 2016-03-26 DIAGNOSIS — C689 Malignant neoplasm of urinary organ, unspecified: Secondary | ICD-10-CM

## 2016-03-26 DIAGNOSIS — Z95828 Presence of other vascular implants and grafts: Secondary | ICD-10-CM

## 2016-03-26 DIAGNOSIS — C7800 Secondary malignant neoplasm of unspecified lung: Secondary | ICD-10-CM

## 2016-03-26 DIAGNOSIS — Z5112 Encounter for antineoplastic immunotherapy: Secondary | ICD-10-CM | POA: Diagnosis not present

## 2016-03-26 LAB — COMPREHENSIVE METABOLIC PANEL
ALT: 25 U/L (ref 0–55)
ANION GAP: 8 meq/L (ref 3–11)
AST: 28 U/L (ref 5–34)
Albumin: 3.4 g/dL — ABNORMAL LOW (ref 3.5–5.0)
Alkaline Phosphatase: 88 U/L (ref 40–150)
BILIRUBIN TOTAL: 0.84 mg/dL (ref 0.20–1.20)
BUN: 25.9 mg/dL (ref 7.0–26.0)
CALCIUM: 9.3 mg/dL (ref 8.4–10.4)
CHLORIDE: 106 meq/L (ref 98–109)
CO2: 26 meq/L (ref 22–29)
CREATININE: 1.2 mg/dL (ref 0.7–1.3)
EGFR: 58 mL/min/{1.73_m2} — ABNORMAL LOW (ref >90)
Glucose: 117 mg/dl (ref 70–140)
Potassium: 4.4 mEq/L (ref 3.5–5.1)
Sodium: 140 mEq/L (ref 136–145)
TOTAL PROTEIN: 6.5 g/dL (ref 6.4–8.3)

## 2016-03-26 LAB — CBC WITH DIFFERENTIAL/PLATELET
BASO%: 0.5 % (ref 0.0–2.0)
Basophils Absolute: 0 10*3/uL (ref 0.0–0.1)
EOS%: 3.4 % (ref 0.0–7.0)
Eosinophils Absolute: 0.2 10*3/uL (ref 0.0–0.5)
HEMATOCRIT: 38.2 % — AB (ref 38.4–49.9)
HEMOGLOBIN: 12.7 g/dL — AB (ref 13.0–17.1)
LYMPH#: 1.4 10*3/uL (ref 0.9–3.3)
LYMPH%: 24.5 % (ref 14.0–49.0)
MCH: 32.6 pg (ref 27.2–33.4)
MCHC: 33.2 g/dL (ref 32.0–36.0)
MCV: 97.9 fL (ref 79.3–98.0)
MONO#: 0.8 10*3/uL (ref 0.1–0.9)
MONO%: 14.6 % — ABNORMAL HIGH (ref 0.0–14.0)
NEUT%: 57 % (ref 39.0–75.0)
NEUTROS ABS: 3.2 10*3/uL (ref 1.5–6.5)
PLATELETS: 140 10*3/uL (ref 140–400)
RBC: 3.9 10*6/uL — ABNORMAL LOW (ref 4.20–5.82)
RDW: 12.7 % (ref 11.0–14.6)
WBC: 5.5 10*3/uL (ref 4.0–10.3)

## 2016-03-26 MED ORDER — HEPARIN SOD (PORK) LOCK FLUSH 100 UNIT/ML IV SOLN
500.0000 [IU] | Freq: Once | INTRAVENOUS | Status: AC | PRN
  Administered 2016-03-26: 500 [IU]
  Filled 2016-03-26: qty 5

## 2016-03-26 MED ORDER — SODIUM CHLORIDE 0.9 % IV SOLN
Freq: Once | INTRAVENOUS | Status: AC
  Administered 2016-03-26: 10:00:00 via INTRAVENOUS

## 2016-03-26 MED ORDER — SODIUM CHLORIDE 0.9 % IV SOLN
200.0000 mg | Freq: Once | INTRAVENOUS | Status: AC
  Administered 2016-03-26: 200 mg via INTRAVENOUS
  Filled 2016-03-26: qty 8

## 2016-03-26 MED ORDER — SODIUM CHLORIDE 0.9 % IJ SOLN
10.0000 mL | INTRAMUSCULAR | Status: DC | PRN
  Administered 2016-03-26: 10 mL via INTRAVENOUS
  Filled 2016-03-26: qty 10

## 2016-03-26 MED ORDER — SODIUM CHLORIDE 0.9% FLUSH
10.0000 mL | INTRAVENOUS | Status: DC | PRN
  Administered 2016-03-26: 10 mL
  Filled 2016-03-26: qty 10

## 2016-03-26 NOTE — Progress Notes (Signed)
Hematology and Oncology Follow Up Visit  [name] [name] [id number] 1945/07/08 70 y.o. 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 He presented with 4.9 cm mass of the upper pole of the left kidney with documented pulmonary metastasis. Neurological confirmation done on 04/15/2015 with a biopsy showed urothelial carcinoma.   Prior Therapy:   Status post biopsy of the left renal mass done on 04/15/2015. He is status post Port-A-Cath insertion on 05/21/2015. Systemic chemotherapy in the form of cisplatin and gemcitabine started on 05/27/2015. He is status post 6 cycles completed on 09/16/2015. CT scan on 01/06/2016 showed progression of disease.  Current therapy: Pembrolizumab 200 mg every 3 weeks cycle 1 on 02/13/2016. He is here for cycle 3 of therapy.  Interim History:  [name] [name] presents today for a follow-up visit. Since the last visit, he continues to do very well and enjoys excellent quality of life. He tolerated the last cycle of Pembrolizumab without any complications. He reported very mild pruritus without any rash and continues to be manageable at this time. He denied any nausea, abdominal pain or respiratory distress. He denied any wheezing or hemoptysis. He denied any cough. He denied any back pain or shoulder pain. He denied any hematuria or pelvic pain.     He does not report any headaches, blurry vision, syncope or seizures. He does not report any fevers, chills, or sweats. He does not report any chest pain, palpitation, orthopnea or leg edema. He does not report any wheezing or hemoptysis. He does not report any  vomiting, abdominal pain does report occasional dyspepsia. He does not report any frequency, urgency or hesitancy. He does not report any skeletal complaints. Remaining review of systems unremarkable.   Medications: I have  reviewed the patient's current medications.  Current Outpatient Prescriptions  Medication Sig Dispense Refill  . acetaminophen (TYLENOL) 325 MG tablet Take 650 mg by mouth every 6 (six) hours as needed for moderate pain or headache.     [name] atorvastatin (LIPITOR) 40 MG tablet Take 1 tablet (40 mg total) by mouth daily. 90 tablet 3  . Cholecalciferol (VITAMIN D) 1000 UNITS capsule Take 1,000 Units by mouth daily.      . diphenhydrAMINE (SOMINEX) 25 MG tablet Take 25 mg by mouth at bedtime.    . docusate sodium (COLACE) 100 MG capsule Take 1 capsule (100 mg total) by mouth 2 (two) times daily. (Patient taking differently: Take 100 mg by mouth daily as needed for mild constipation. ) 10 capsule 0  . lidocaine-prilocaine (EMLA) cream Apply to port-a-cath 1-2 hours prior to acces. Cover with saran wrap. 30 g 1  . lisinopril (PRINIVIL,ZESTRIL) 5 MG tablet Take 1 tablet (5 mg total) by mouth daily. (Patient taking differently: Take 5 mg by mouth every evening. ) 90 tablet 3  . metoprolol tartrate (LOPRESSOR) 25 MG tablet Take 0.5 tablets (12.5 mg total) by mouth 2 (two) times daily. 90 tablet 3  . nitroGLYCERIN (NITROSTAT) 0.4 MG SL tablet Place 1 tablet (0.4 mg total) under the tongue every 5 (five) minutes as needed. May repeat up to 3 doses. 100 tablet 3  . polycarbophil (FIBERCON) 625 MG tablet Take 625 mg by mouth daily.     . ranitidine (ZANTAC) 300 MG capsule Take 300 mg by mouth every evening.    . sildenafil (VIAGRA) 50 MG tablet Take 50 mg by mouth as needed for  erectile dysfunction.      No current facility-administered medications for this visit.      Allergies:  Allergies  Allergen Reactions  . Hydrocodone Other (See Comments)    Became too sedated    Past Medical History, Surgical history, Social history, and Family History were reviewed and updated.   Physical Exam: Blood pressure 115/66, pulse (!) 56, temperature 98.2 F (36.8 C), temperature source Oral, resp. rate 18, height  5\' 9"  (1.753 m), weight 185 lb 1.6 oz (84 kg), SpO2 99 %. ECOG: 0 General appearance: Pleasant-appearing gentleman without distress. Head: Normocephalic, without obvious abnormality no oral rash noted. Neck: no adenopathy Lymph nodes: Cervical, supraclavicular, and axillary nodes normal. Heart:regular rate and rhythm, S1, S2 normal, no murmur, click, rub or gallop Lung:chest clear, no wheezing, rales, normal symmetric air entry Abdomin: soft, non-tender, without masses or organomegaly no shifting dullness or ascites. EXT:no erythema, induration, or nodules   Lab Results: Lab Results  Component Value Date   WBC 5.5 03/26/2016   HGB 12.7 (L) 03/26/2016   HCT 38.2 (L) 03/26/2016   MCV 97.9 03/26/2016   PLT 140 03/26/2016     Chemistry      Component Value Date/Time   NA 139 03/05/2016 1145   K 4.2 03/05/2016 1145   CL 104 05/21/2015 0840   CO2 24 03/05/2016 1145   BUN 23.0 03/05/2016 1145   CREATININE 1.2 03/05/2016 1145      Component Value Date/Time   CALCIUM 9.0 03/05/2016 1145   ALKPHOS 89 03/05/2016 1145   AST 21 03/05/2016 1145   ALT 17 03/05/2016 1145   BILITOT 0.52 03/05/2016 1145      Impression and Plan:   70 year old gentleman with the following issues:  1. 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 He did have a pathological confirmation done on 04/15/2015 with the specimen showed urothelial carcinoma.  He is status post 6 cycles of therapy completed in March 2017. CT scan on 04/11/2017showed continuous response to therapy. He had further decrease in his bilateral pulmonary nodules as well as decrease in the left upper pole renal lesion.  Repeat CT scan on 01/06/2016 showed mild progression of disease especially in his primary tumor. His pulmonary nodules have remained stable.   He is  currently receiving Pembrolizumab and continues to tolerated very well. He is ready to proceed with cycle 3 without any dose reduction or delay. The plan is to proceed with staging CT scan before cycle 4 of therapy. He continues to have excellent response to this therapy, he will continue without any change. Different salvage therapy will be utilized if he develops progression of disease.   2. Nausea and GI complication prophylaxis: Antiemetics regimen is available to him as needed. None noted at this time.  3. IV access: Port-A-Cath remained in use without any complications.  4. Renal function prophylaxis: Creatinine remains stable.  5. Cough: Likely related to his pulmonary metastasis. Resolved at this time.  6. Autoimmune surveillance: Thyroid function will be checked periodically. His TSH is within normal range.  7. Follow-up: Will be in 3 weeks for the next cycle of therapy.  [name], MD 9/29/20179:52 AM

## 2016-03-26 NOTE — Telephone Encounter (Signed)
Gave patient avs report and appointments for October and November. Central radiology will call re scan.  °

## 2016-03-26 NOTE — Patient Instructions (Signed)

## 2016-03-26 NOTE — Patient Instructions (Signed)
[facility] Discharge Instructions for Patients Receiving Chemotherapy  Today you received the following chemotherapy agents:  Keytruda.  To help prevent nausea and vomiting after your treatment, we encourage you to take your nausea medication as prescribed.   If you develop nausea and vomiting that is not controlled by your nausea medication, call the clinic.   BELOW ARE SYMPTOMS THAT SHOULD BE REPORTED IMMEDIATELY:  *FEVER GREATER THAN 100.5 F  *CHILLS WITH OR WITHOUT FEVER  NAUSEA AND VOMITING THAT IS NOT CONTROLLED WITH YOUR NAUSEA MEDICATION  *UNUSUAL SHORTNESS OF BREATH  *UNUSUAL BRUISING OR BLEEDING  TENDERNESS IN MOUTH AND THROAT WITH OR WITHOUT PRESENCE OF ULCERS  *URINARY PROBLEMS  *BOWEL PROBLEMS  UNUSUAL RASH Items with * indicate a potential emergency and should be followed up as soon as possible.  Feel free to call the clinic you have any questions or concerns. The clinic phone number is [phone].  Please show the CHEMO ALERT CARD at check-in to the Emergency Department and triage nurse.   

## 2016-04-14 ENCOUNTER — Encounter (HOSPITAL_COMMUNITY): Payer: Self-pay

## 2016-04-14 ENCOUNTER — Ambulatory Visit (HOSPITAL_COMMUNITY)
Admission: RE | Admit: 2016-04-14 | Discharge: 2016-04-14 | Disposition: A | Discharge status: Home / Self Care | Payer: Medicare Other | Source: Ambulatory Visit | Attending: Oncology | Admitting: Oncology

## 2016-04-14 DIAGNOSIS — C689 Malignant neoplasm of urinary organ, unspecified: Secondary | ICD-10-CM | POA: Diagnosis not present

## 2016-04-14 DIAGNOSIS — C779 Secondary and unspecified malignant neoplasm of lymph node, unspecified: Secondary | ICD-10-CM | POA: Insufficient documentation

## 2016-04-14 DIAGNOSIS — R590 Localized enlarged lymph nodes: Secondary | ICD-10-CM | POA: Diagnosis not present

## 2016-04-14 DIAGNOSIS — R918 Other nonspecific abnormal finding of lung field: Secondary | ICD-10-CM | POA: Diagnosis not present

## 2016-04-14 DIAGNOSIS — N2889 Other specified disorders of kidney and ureter: Secondary | ICD-10-CM | POA: Insufficient documentation

## 2016-04-14 MED ORDER — IOPAMIDOL (ISOVUE-300) INJECTION 61%
100.0000 mL | Freq: Once | INTRAVENOUS | Status: AC | PRN
  Administered 2016-04-14: 100 mL via INTRAVENOUS

## 2016-04-16 ENCOUNTER — Ambulatory Visit: Payer: Medicare Other

## 2016-04-16 ENCOUNTER — Other Ambulatory Visit (HOSPITAL_BASED_OUTPATIENT_CLINIC_OR_DEPARTMENT_OTHER): Payer: Medicare Other

## 2016-04-16 ENCOUNTER — Ambulatory Visit (HOSPITAL_BASED_OUTPATIENT_CLINIC_OR_DEPARTMENT_OTHER): Payer: Medicare Other | Admitting: Oncology

## 2016-04-16 ENCOUNTER — Ambulatory Visit (HOSPITAL_BASED_OUTPATIENT_CLINIC_OR_DEPARTMENT_OTHER): Payer: Medicare Other

## 2016-04-16 VITALS — BP 130/66 | HR 55 | Temp 97.5°F | Resp 18 | Ht 69.0 in | Wt 188.0 lb

## 2016-04-16 DIAGNOSIS — C7800 Secondary malignant neoplasm of unspecified lung: Secondary | ICD-10-CM

## 2016-04-16 DIAGNOSIS — Z5112 Encounter for antineoplastic immunotherapy: Secondary | ICD-10-CM | POA: Diagnosis not present

## 2016-04-16 DIAGNOSIS — C689 Malignant neoplasm of urinary organ, unspecified: Secondary | ICD-10-CM

## 2016-04-16 DIAGNOSIS — Z95828 Presence of other vascular implants and grafts: Secondary | ICD-10-CM

## 2016-04-16 DIAGNOSIS — C688 Malignant neoplasm of overlapping sites of urinary organs: Secondary | ICD-10-CM | POA: Diagnosis not present

## 2016-04-16 LAB — CBC WITH DIFFERENTIAL/PLATELET
BASO%: 0.9 % (ref 0.0–2.0)
Basophils Absolute: 0 10*3/uL (ref 0.0–0.1)
EOS%: 3.2 % (ref 0.0–7.0)
Eosinophils Absolute: 0.2 10*3/uL (ref 0.0–0.5)
HEMATOCRIT: 38.5 % (ref 38.4–49.9)
HGB: 12.5 g/dL — ABNORMAL LOW (ref 13.0–17.1)
LYMPH#: 0.9 10*3/uL (ref 0.9–3.3)
LYMPH%: 18.4 % (ref 14.0–49.0)
MCH: 32 pg (ref 27.2–33.4)
MCHC: 32.5 g/dL (ref 32.0–36.0)
MCV: 98.5 fL — ABNORMAL HIGH (ref 79.3–98.0)
MONO#: 0.7 10*3/uL (ref 0.1–0.9)
MONO%: 13.4 % (ref 0.0–14.0)
NEUT%: 64.1 % (ref 39.0–75.0)
NEUTROS ABS: 3.3 10*3/uL (ref 1.5–6.5)
PLATELETS: 156 10*3/uL (ref 140–400)
RBC: 3.91 10*6/uL — ABNORMAL LOW (ref 4.20–5.82)
RDW: 13 % (ref 11.0–14.6)
WBC: 5.1 10*3/uL (ref 4.0–10.3)

## 2016-04-16 LAB — COMPREHENSIVE METABOLIC PANEL
ALBUMIN: 3.4 g/dL — AB (ref 3.5–5.0)
ALT: 27 U/L (ref 0–55)
ANION GAP: 8 meq/L (ref 3–11)
AST: 27 U/L (ref 5–34)
Alkaline Phosphatase: 90 U/L (ref 40–150)
BILIRUBIN TOTAL: 0.81 mg/dL (ref 0.20–1.20)
BUN: 19.1 mg/dL (ref 7.0–26.0)
CALCIUM: 9.1 mg/dL (ref 8.4–10.4)
CO2: 25 mEq/L (ref 22–29)
CREATININE: 1.2 mg/dL (ref 0.7–1.3)
Chloride: 106 mEq/L (ref 98–109)
EGFR: 62 mL/min/{1.73_m2} — ABNORMAL LOW (ref >90)
Glucose: 140 mg/dl (ref 70–140)
Potassium: 4.2 mEq/L (ref 3.5–5.1)
Sodium: 140 mEq/L (ref 136–145)
TOTAL PROTEIN: 6.4 g/dL (ref 6.4–8.3)

## 2016-04-16 MED ORDER — SODIUM CHLORIDE 0.9 % IV SOLN
Freq: Once | INTRAVENOUS | Status: AC
  Administered 2016-04-16: 10:00:00 via INTRAVENOUS

## 2016-04-16 MED ORDER — SODIUM CHLORIDE 0.9 % IJ SOLN
10.0000 mL | INTRAMUSCULAR | Status: DC | PRN
  Administered 2016-04-16: 10 mL via INTRAVENOUS
  Filled 2016-04-16: qty 10

## 2016-04-16 MED ORDER — SODIUM CHLORIDE 0.9% FLUSH
10.0000 mL | INTRAVENOUS | Status: DC | PRN
  Administered 2016-04-16: 10 mL
  Filled 2016-04-16: qty 10

## 2016-04-16 MED ORDER — SODIUM CHLORIDE 0.9 % IV SOLN
200.0000 mg | Freq: Once | INTRAVENOUS | Status: AC
  Administered 2016-04-16: 200 mg via INTRAVENOUS
  Filled 2016-04-16: qty 8

## 2016-04-16 MED ORDER — HEPARIN SOD (PORK) LOCK FLUSH 100 UNIT/ML IV SOLN
500.0000 [IU] | Freq: Once | INTRAVENOUS | Status: AC | PRN
  Administered 2016-04-16: 500 [IU]
  Filled 2016-04-16: qty 5

## 2016-04-16 NOTE — Progress Notes (Signed)
Hematology and Oncology Follow Up Visit  [name] [name] [id number] 30-Jun-1945 70 y.o. 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 He presented with 4.9 cm mass of the upper pole of the left kidney with documented pulmonary metastasis. Neurological confirmation done on 04/15/2015 with a biopsy showed urothelial carcinoma.   Prior Therapy:   Status post biopsy of the left renal mass done on 04/15/2015. He is status post Port-A-Cath insertion on 05/21/2015. Systemic chemotherapy in the form of cisplatin and gemcitabine started on 05/27/2015. He is status post 6 cycles completed on 09/16/2015. CT scan on 01/06/2016 showed progression of disease.  Current therapy: Pembrolizumab 200 mg every 3 weeks cycle 1 on 02/13/2016. He is here for cycle 4 of therapy.  Interim History:  [name] presents today for a follow-up visit. Since the last visit, he reports no major changes in his health. He tolerated the last cycle of Pembrolizumab without any complications. He reported continuous pruritus with the periodic dermatitis that has been manageable at this time. He denied any nausea, abdominal pain or respiratory distress. He denied any wheezing or hemoptysis. He denied any cough. He denied any back pain or shoulder pain. He denied any hematuria or pelvic pain. He continues to show excellent quality of life and his performance status is unchanged. His appetite continues to be excellent and have gained more weight.    He does not report any headaches, blurry vision, syncope or seizures. He does not report any fevers, chills, or sweats. He does not report any chest pain, palpitation, orthopnea or leg edema. He does not report any wheezing or hemoptysis. He does not report any  vomiting, abdominal pain does report occasional dyspepsia. He does not report any frequency,  urgency or hesitancy. He does not report any skeletal complaints. Remaining review of systems unremarkable.   Medications: I have reviewed the patient's current medications.  Current Outpatient Prescriptions  Medication Sig Dispense Refill  . acetaminophen (TYLENOL) 325 MG tablet Take 650 mg by mouth every 6 (six) hours as needed for moderate pain or headache.     [name] atorvastatin (LIPITOR) 40 MG tablet Take 1 tablet (40 mg total) by mouth daily. 90 tablet 3  . Cholecalciferol (VITAMIN D) 1000 UNITS capsule Take 1,000 Units by mouth daily.      . diphenhydrAMINE (SOMINEX) 25 MG tablet Take 25 mg by mouth at bedtime.    . docusate sodium (COLACE) 100 MG capsule Take 1 capsule (100 mg total) by mouth 2 (two) times daily. (Patient taking differently: Take 100 mg by mouth daily as needed for mild constipation. ) 10 capsule 0  . lidocaine-prilocaine (EMLA) cream Apply to port-a-cath 1-2 hours prior to acces. Cover with saran wrap. 30 g 1  . lisinopril (PRINIVIL,ZESTRIL) 5 MG tablet Take 1 tablet (5 mg total) by mouth daily. (Patient taking differently: Take 5 mg by mouth every evening. ) 90 tablet 3  . metoprolol tartrate (LOPRESSOR) 25 MG tablet Take 0.5 tablets (12.5 mg total) by mouth 2 (two) times daily. 90 tablet 3  . nitroGLYCERIN (NITROSTAT) 0.4 MG SL tablet Place 1 tablet (0.4 mg total) under the tongue every 5 (five) minutes as needed. May repeat up to 3 doses. 100 tablet 3  . polycarbophil (FIBERCON) 625 MG tablet Take 625 mg by mouth daily.     . ranitidine (ZANTAC) 300 MG capsule Take 300 mg by mouth  every evening.    . sildenafil (VIAGRA) 50 MG tablet Take 50 mg by mouth as needed for erectile dysfunction.      No current facility-administered medications for this visit.      Allergies:  Allergies  Allergen Reactions  . Hydrocodone Other (See Comments)    Became too sedated    Past Medical History, Surgical history, Social history, and Family History were reviewed and  updated.   Physical Exam: Blood pressure 130/66, pulse (!) 55, temperature 97.5 F (36.4 C), temperature source Oral, resp. rate 18, height 5\' 9"  (1.753 m), weight 188 lb (85.3 kg), SpO2 100 %. ECOG: 0 General appearance: Alert, awake gentleman without distress. Head: Normocephalic, without obvious abnormality no oral ulcers or lesions. Neck: no adenopathy Lymph nodes: Cervical, supraclavicular, and axillary nodes normal. Heart:regular rate and rhythm, S1, S2 normal, no murmur, click, rub or gallop Lung:chest clear, no wheezing, rales, normal symmetric air entry Abdomin: soft, non-tender, without masses or organomegaly no rebound or guarding. EXT:no erythema, induration, or nodules Skin: Very mild erythema noted in few lesions on his upper extremities.  Lab Results: Lab Results  Component Value Date   WBC 5.1 04/16/2016   HGB 12.5 (L) 04/16/2016   HCT 38.5 04/16/2016   MCV 98.5 (H) 04/16/2016   PLT 156 04/16/2016     Chemistry      Component Value Date/Time   NA 140 03/26/2016 0913   K 4.4 03/26/2016 0913   CL 104 05/21/2015 0840   CO2 26 03/26/2016 0913   BUN 25.9 03/26/2016 0913   CREATININE 1.2 03/26/2016 0913      Component Value Date/Time   CALCIUM 9.3 03/26/2016 0913   ALKPHOS 88 03/26/2016 0913   AST 28 03/26/2016 0913   ALT 25 03/26/2016 0913   BILITOT 0.84 03/26/2016 0913     EXAM: CT CHEST, ABDOMEN, AND PELVIS WITH CONTRAST  TECHNIQUE: Multidetector CT imaging of the chest, abdomen and pelvis was performed following the standard protocol during bolus administration of intravenous contrast.  CONTRAST:  162mL ISOVUE-300 IOPAMIDOL (ISOVUE-300) INJECTION 61%  COMPARISON:  01/06/2016  FINDINGS: CT CHEST FINDINGS  Cardiovascular: Previous median sternotomy and CABG procedure. Heart size is normal. No pericardial effusion.  Mediastinum/Nodes: No enlarged mediastinal, hilar, or axillary lymph nodes. Thyroid gland, trachea, and esophagus  demonstrate no significant findings.  Lungs/Pleura: No pleural effusion. Left upper lobe pulmonary nodule is identified measuring 9 mm, image number 22 of series 4. Previously 10 mm. Stable 5 mm left upper lobe pulmonary nodule, image number 47 of series 4. Stable subpleural nodule in the anterior left upper lobe measuring 4 mm, image 50 of series 4. Pulmonary nodule within the right lower lobe has increased in size. Currently this measures 10 mm, image 89 of series 4. Previously 4 mm.  Musculoskeletal: No aggressive lytic or sclerotic bone lesions identified.  CT ABDOMEN PELVIS FINDINGS  Hepatobiliary: 1.8 cm low-attenuation structure along the dome of liver is unchanged, image 49 of series 2.  Pancreas: Unremarkable. No pancreatic ductal dilatation or surrounding inflammatory changes.  Spleen: Normal in size without focal abnormality.  Adrenals/Urinary Tract: The adrenal glands are normal. Unremarkable appearance of the right kidney. Mass within the upper pole of the measures 2.9 x 2.2, image 14 of series 6. Previously 2.1 x 2.5 cm previously. Urinary bladder is normal.  Stomach/Bowel: Small hiatal hernia. The small bowel loops have a normal course and caliber. No obstruction. The appendix appears mildly thickened measuring 1 cm in diameter, image 92 of  series 2. This is similar to the previous exam. No pathologic dilatation of the colon  Vascular/Lymphatic: Aortic atherosclerosis. Previous enlarged periaortic node measures 1 cm, image 67 of series 2. Previously 1.7 cm. No enlarged pelvic or inguinal lymph nodes.  Reproductive: Prostate is unremarkable.  Other: No abdominal wall hernia or abnormality. No abdominopelvic ascites.  Musculoskeletal: Degenerative disc disease noted within the lumbar spine. No suspicious bone lesions identified.  IMPRESSION: 1. Mixed interval response to therapy. 2. Enlarging right lower lobe pulmonary nodule. The  remaining nodules are stable compared with previous exam. 3. Significant decrease in size of periaortic metastatic adenopathy identified on previous exam. 4. No significant change in size of mass involving the upper pole of left kidney   Impression and Plan:   70 year old gentleman with the following issues:  1. 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 He did have a pathological confirmation done on 04/15/2015 with the specimen showed urothelial carcinoma.  He is status post 6 cycles of therapy completed in March 2017. CT scan on 04/11/2017showed continuous response to therapy. He had further decrease in his bilateral pulmonary nodules as well as decrease in the left upper pole renal lesion.  Repeat CT scan on 01/06/2016 showed mild progression of disease especially in his primary tumor. His pulmonary nodules have remained stable.   He is currently receiving Pembrolizumab and continues to tolerated very well.   CT scan on 04/14/2016 was percent reviewed and showed excellent response after 3 cycles of therapy. The plan is to continue the same dose and schedule without any dose reduction or delay. The plan is to repeat imaging studies after 3 or 4 more cycles. Long as he is tolerating therapy at this time he will continue with treatments given his excellent benefit.  2. Nausea and GI complication prophylaxis: Antiemetics regimen is available to him as needed. No issues noted at this time.  3. IV access: Port-A-Cath remained in use without any complications.  4. Dermatitis: Related to Pembrolizumab which is manageable at this time with antihistamines. We will continue to monitor this closely as a potential side effect.  5. Cough: Likely related to his pulmonary metastasis. Resolved at this time.  6. Autoimmune  surveillance: Thyroid function will be checked periodically. His TSH is within normal range.  7. Follow-up: Will be in 3 weeks for the next cycle of therapy.  [id number], MD 10/20/20179:25 AM

## 2016-04-16 NOTE — Patient Instructions (Signed)
[facility] Discharge Instructions for Patients Receiving Chemotherapy  Today you received the following chemotherapy agents:  Keytruda.  To help prevent nausea and vomiting after your treatment, we encourage you to take your nausea medication as prescribed.   If you develop nausea and vomiting that is not controlled by your nausea medication, call the clinic.   BELOW ARE SYMPTOMS THAT SHOULD BE REPORTED IMMEDIATELY:  *FEVER GREATER THAN 100.5 F  *CHILLS WITH OR WITHOUT FEVER  NAUSEA AND VOMITING THAT IS NOT CONTROLLED WITH YOUR NAUSEA MEDICATION  *UNUSUAL SHORTNESS OF BREATH  *UNUSUAL BRUISING OR BLEEDING  TENDERNESS IN MOUTH AND THROAT WITH OR WITHOUT PRESENCE OF ULCERS  *URINARY PROBLEMS  *BOWEL PROBLEMS  UNUSUAL RASH Items with * indicate a potential emergency and should be followed up as soon as possible.  Feel free to call the clinic you have any questions or concerns. The clinic phone number is [phone].  Please show the CHEMO ALERT CARD at check-in to the Emergency Department and triage nurse.   

## 2016-04-21 ENCOUNTER — Encounter: Payer: Self-pay | Admitting: Oncology

## 2016-04-26 ENCOUNTER — Telehealth: Payer: Self-pay | Admitting: Internal Medicine

## 2016-04-26 NOTE — Telephone Encounter (Signed)
Left message re 12/1 appointments. Patient to get new schedule 11/10.

## 2016-04-27 ENCOUNTER — Encounter: Payer: Self-pay | Admitting: Cardiology

## 2016-05-06 ENCOUNTER — Other Ambulatory Visit: Payer: Self-pay

## 2016-05-06 DIAGNOSIS — C689 Malignant neoplasm of urinary organ, unspecified: Secondary | ICD-10-CM

## 2016-05-07 ENCOUNTER — Telehealth: Payer: Self-pay | Admitting: *Deleted

## 2016-05-07 ENCOUNTER — Ambulatory Visit (HOSPITAL_BASED_OUTPATIENT_CLINIC_OR_DEPARTMENT_OTHER): Payer: Medicare Other

## 2016-05-07 ENCOUNTER — Ambulatory Visit (HOSPITAL_BASED_OUTPATIENT_CLINIC_OR_DEPARTMENT_OTHER): Payer: Medicare Other | Admitting: Oncology

## 2016-05-07 ENCOUNTER — Ambulatory Visit: Payer: Medicare Other

## 2016-05-07 ENCOUNTER — Other Ambulatory Visit (HOSPITAL_BASED_OUTPATIENT_CLINIC_OR_DEPARTMENT_OTHER): Payer: Medicare Other

## 2016-05-07 ENCOUNTER — Telehealth: Payer: Self-pay | Admitting: Oncology

## 2016-05-07 VITALS — BP 127/64 | HR 58 | Temp 97.6°F | Resp 18 | Ht 69.0 in | Wt 188.7 lb

## 2016-05-07 DIAGNOSIS — C652 Malignant neoplasm of left renal pelvis: Secondary | ICD-10-CM | POA: Diagnosis not present

## 2016-05-07 DIAGNOSIS — Z79899 Other long term (current) drug therapy: Secondary | ICD-10-CM | POA: Diagnosis not present

## 2016-05-07 DIAGNOSIS — C7800 Secondary malignant neoplasm of unspecified lung: Secondary | ICD-10-CM

## 2016-05-07 DIAGNOSIS — Z95828 Presence of other vascular implants and grafts: Secondary | ICD-10-CM

## 2016-05-07 DIAGNOSIS — C689 Malignant neoplasm of urinary organ, unspecified: Secondary | ICD-10-CM

## 2016-05-07 DIAGNOSIS — Z5112 Encounter for antineoplastic immunotherapy: Secondary | ICD-10-CM | POA: Diagnosis not present

## 2016-05-07 LAB — CBC WITH DIFFERENTIAL/PLATELET
BASO%: 0.8 % (ref 0.0–2.0)
BASOS ABS: 0 10*3/uL (ref 0.0–0.1)
EOS ABS: 0.1 10*3/uL (ref 0.0–0.5)
EOS%: 2.6 % (ref 0.0–7.0)
HEMATOCRIT: 39.7 % (ref 38.4–49.9)
HEMOGLOBIN: 13.1 g/dL (ref 13.0–17.1)
LYMPH#: 1.2 10*3/uL (ref 0.9–3.3)
LYMPH%: 21.6 % (ref 14.0–49.0)
MCH: 32.1 pg (ref 27.2–33.4)
MCHC: 33 g/dL (ref 32.0–36.0)
MCV: 97.5 fL (ref 79.3–98.0)
MONO#: 0.7 10*3/uL (ref 0.1–0.9)
MONO%: 13.1 % (ref 0.0–14.0)
NEUT%: 61.9 % (ref 39.0–75.0)
NEUTROS ABS: 3.4 10*3/uL (ref 1.5–6.5)
PLATELETS: 159 10*3/uL (ref 140–400)
RBC: 4.07 10*6/uL — ABNORMAL LOW (ref 4.20–5.82)
RDW: 13.2 % (ref 11.0–14.6)
WBC: 5.5 10*3/uL (ref 4.0–10.3)

## 2016-05-07 LAB — TSH: TSH: 45.873 m[IU]/L — AB (ref 0.320–4.118)

## 2016-05-07 LAB — COMPREHENSIVE METABOLIC PANEL
ALBUMIN: 3.5 g/dL (ref 3.5–5.0)
ALK PHOS: 92 U/L (ref 40–150)
ALT: 25 U/L (ref 0–55)
AST: 28 U/L (ref 5–34)
Anion Gap: 8 mEq/L (ref 3–11)
BILIRUBIN TOTAL: 0.97 mg/dL (ref 0.20–1.20)
BUN: 22.9 mg/dL (ref 7.0–26.0)
CALCIUM: 9.4 mg/dL (ref 8.4–10.4)
CO2: 26 mEq/L (ref 22–29)
Chloride: 104 mEq/L (ref 98–109)
Creatinine: 1.3 mg/dL (ref 0.7–1.3)
EGFR: 54 mL/min/{1.73_m2} — ABNORMAL LOW (ref >90)
Glucose: 133 mg/dl (ref 70–140)
Potassium: 4.3 mEq/L (ref 3.5–5.1)
Sodium: 137 mEq/L (ref 136–145)
TOTAL PROTEIN: 6.7 g/dL (ref 6.4–8.3)

## 2016-05-07 MED ORDER — SODIUM CHLORIDE 0.9% FLUSH
10.0000 mL | INTRAVENOUS | Status: DC | PRN
  Administered 2016-05-07: 10 mL
  Filled 2016-05-07: qty 10

## 2016-05-07 MED ORDER — HEPARIN SOD (PORK) LOCK FLUSH 100 UNIT/ML IV SOLN
500.0000 [IU] | Freq: Once | INTRAVENOUS | Status: AC | PRN
  Administered 2016-05-07: 500 [IU]
  Filled 2016-05-07: qty 5

## 2016-05-07 MED ORDER — SODIUM CHLORIDE 0.9 % IJ SOLN
10.0000 mL | INTRAMUSCULAR | Status: DC | PRN
  Administered 2016-05-07: 10 mL via INTRAVENOUS
  Filled 2016-05-07: qty 10

## 2016-05-07 MED ORDER — PEMBROLIZUMAB CHEMO INJECTION 100 MG/4ML
200.0000 mg | Freq: Once | INTRAVENOUS | Status: AC
  Administered 2016-05-07: 200 mg via INTRAVENOUS
  Filled 2016-05-07: qty 8

## 2016-05-07 MED ORDER — SODIUM CHLORIDE 0.9 % IV SOLN
Freq: Once | INTRAVENOUS | Status: AC
  Administered 2016-05-07: 13:00:00 via INTRAVENOUS

## 2016-05-07 NOTE — Progress Notes (Signed)
Hematology and Oncology Follow Up Visit  [name] [name] [id number] 30-Jan-1946 70 y.o. 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 He presented with 4.9 cm mass of the upper pole of the left kidney with documented pulmonary metastasis. Neurological confirmation done on 04/15/2015 with a biopsy showed urothelial carcinoma.   Prior Therapy:   Status post biopsy of the left renal mass done on 04/15/2015. He is status post Port-A-Cath insertion on 05/21/2015. Systemic chemotherapy in the form of cisplatin and gemcitabine started on 05/27/2015. He is status post 6 cycles completed on 09/16/2015. CT scan on 01/06/2016 showed progression of disease.  Current therapy: Pembrolizumab 200 mg every 3 weeks cycle 1 on 02/13/2016. He is here for cycle 5 of therapy.  Interim History:  [name] [name] presents today for a follow-up visit. Since the last visit, he continues to do well and reports no side effects associated with his therapy. He tolerated the last cycle of Pembrolizumab without any denied any nausea, abdominal pain or respiratory distress. He denied any wheezing or hemoptysis. He denied any cough. He denied any back pain or shoulder pain. He denied any pelvic pain. He does report periodic rather rare intermittent hematuria. This has resolved at this time. His performance status and quality of life remains maintained and actually improved on this therapy.    He does not report any headaches, blurry vision, syncope or seizures. He does not report any fevers, chills, or sweats. He does not report any chest pain, palpitation, orthopnea or leg edema. He does not report any wheezing or hemoptysis. He does not report any  vomiting, abdominal pain does report occasional dyspepsia. He does not report any frequency, urgency or hesitancy. He does not report any skeletal  complaints. Remaining review of systems unremarkable.   Medications: I have reviewed the patient's current medications.  Current Outpatient Prescriptions  Medication Sig Dispense Refill  . acetaminophen (TYLENOL) 325 MG tablet Take 650 mg by mouth every 6 (six) hours as needed for moderate pain or headache.     [name] atorvastatin (LIPITOR) 40 MG tablet Take 1 tablet (40 mg total) by mouth daily. 90 tablet 3  . Cholecalciferol (VITAMIN D) 1000 UNITS capsule Take 1,000 Units by mouth daily.      . diphenhydrAMINE (SOMINEX) 25 MG tablet Take 25 mg by mouth at bedtime.    . docusate sodium (COLACE) 100 MG capsule Take 1 capsule (100 mg total) by mouth 2 (two) times daily. (Patient taking differently: Take 100 mg by mouth daily as needed for mild constipation. ) 10 capsule 0  . lidocaine-prilocaine (EMLA) cream Apply to port-a-cath 1-2 hours prior to acces. Cover with saran wrap. 30 g 1  . lisinopril (PRINIVIL,ZESTRIL) 5 MG tablet Take 1 tablet (5 mg total) by mouth daily. (Patient taking differently: Take 5 mg by mouth every evening. ) 90 tablet 3  . metoprolol tartrate (LOPRESSOR) 25 MG tablet Take 0.5 tablets (12.5 mg total) by mouth 2 (two) times daily. 90 tablet 3  . nitroGLYCERIN (NITROSTAT) 0.4 MG SL tablet Place 1 tablet (0.4 mg total) under the tongue every 5 (five) minutes as needed. May repeat up to 3 doses. 100 tablet 3  . polycarbophil (FIBERCON) 625 MG tablet Take 625 mg by mouth daily.     . ranitidine (ZANTAC) 300 MG capsule Take 300 mg by mouth every evening.    . sildenafil (VIAGRA) 50  MG tablet Take 50 mg by mouth as needed for erectile dysfunction.      No current facility-administered medications for this visit.      Allergies:  Allergies  Allergen Reactions  . Hydrocodone Other (See Comments)    Became too sedated    Past Medical History, Surgical history, Social history, and Family History were reviewed and updated.   Physical Exam: Blood pressure 127/64, pulse (!) 58,  temperature 97.6 F (36.4 C), temperature source Oral, resp. rate 18, height 5\' 9"  (1.753 m), weight 188 lb 11.2 oz (85.6 kg), SpO2 98 %. ECOG: 0 General appearance: Well-appearing gentleman without distress. Head: Normocephalic, without obvious abnormality no oral thrush noted. Neck: no adenopathy Lymph nodes: Cervical, supraclavicular, and axillary nodes normal. Heart:regular rate and rhythm, S1, S2 normal, no murmur, click, rub or gallop Lung:chest clear, no wheezing, rales, normal symmetric air entry Abdomin: soft, non-tender, without masses or organomegaly no shifting dullness or ascites. EXT:no erythema, induration, or nodules Skin: No rashes or lesions.  Lab Results: Lab Results  Component Value Date   WBC 5.5 05/07/2016   HGB 13.1 05/07/2016   HCT 39.7 05/07/2016   MCV 97.5 05/07/2016   PLT 159 05/07/2016     Chemistry      Component Value Date/Time   NA 140 04/16/2016 0850   K 4.2 04/16/2016 0850   CL 104 05/21/2015 0840   CO2 25 04/16/2016 0850   BUN 19.1 04/16/2016 0850   CREATININE 1.2 04/16/2016 0850      Component Value Date/Time   CALCIUM 9.1 04/16/2016 0850   ALKPHOS 90 04/16/2016 0850   AST 27 04/16/2016 0850   ALT 27 04/16/2016 0850   BILITOT 0.81 04/16/2016 0850       Impression and Plan:   70 year old gentleman with the following issues:  1. 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 He did have a pathological confirmation done on 04/15/2015 with the specimen showed urothelial carcinoma.  He is status post 6 cycles of therapy completed in March 2017. CT scan on 04/11/2017showed continuous response to therapy. He had further decrease in his bilateral pulmonary nodules as well as decrease in the left upper pole renal lesion.  Repeat CT scan on 01/06/2016 showed mild progression of  disease especially in his primary tumor. His pulmonary nodules have remained stable.   He is currently receiving Pembrolizumab and continues to tolerated very well. CT scan on 04/14/2016 showed excellent response after 3 cycles of therapy.   The plan is to continue the same dose and schedule without any dose reduction or delay. He continues to have excellent clinical benefit from this therapy and the plan is to repeat imaging studies in January 2018.  2. Nausea and GI complication prophylaxis: Antiemetics regimen is available to him as needed. No issues noted at this time.  3. IV access: Port-A-Cath remained in use without any complications.  4. Dermatitis: Related to Pembrolizumab which is manageable at this time with antihistamines.  Appears to be manageable on have resolved at this time.  5. Cough: Likely related to his pulmonary metastasis. Resolved at this time.  6. Autoimmune surveillance: Thyroid function will be checked periodically. His TSH is within normal range.  7. Follow-up: Will be in 3 weeks for the next cycle of therapy.  Zola Button, MD 11/10/201712:43 PM

## 2016-05-07 NOTE — Telephone Encounter (Signed)
Appointments scheduled per 11/10 LOS. Patient given AVS report and calendars with future scheduled appointments. °

## 2016-05-07 NOTE — Telephone Encounter (Signed)
Per LOS I have scheduled appts and notified the scheduler 

## 2016-05-07 NOTE — Patient Instructions (Signed)
[facility] Discharge Instructions for Patients Receiving Chemotherapy  Today you received the following chemotherapy agents: Keytruda   To help prevent nausea and vomiting after your treatment, we encourage you to take your nausea medication as directed    If you develop nausea and vomiting that is not controlled by your nausea medication, call the clinic.   BELOW ARE SYMPTOMS THAT SHOULD BE REPORTED IMMEDIATELY:  *FEVER GREATER THAN 100.5 F  *CHILLS WITH OR WITHOUT FEVER  NAUSEA AND VOMITING THAT IS NOT CONTROLLED WITH YOUR NAUSEA MEDICATION  *UNUSUAL SHORTNESS OF BREATH  *UNUSUAL BRUISING OR BLEEDING  TENDERNESS IN MOUTH AND THROAT WITH OR WITHOUT PRESENCE OF ULCERS  *URINARY PROBLEMS  *BOWEL PROBLEMS  UNUSUAL RASH Items with * indicate a potential emergency and should be followed up as soon as possible.  Feel free to call the clinic you have any questions or concerns. The clinic phone number is [phone].  Please show the CHEMO ALERT CARD at check-in to the Emergency Department and triage nurse.   

## 2016-05-11 ENCOUNTER — Encounter: Payer: Self-pay | Admitting: Cardiology

## 2016-05-11 ENCOUNTER — Ambulatory Visit (INDEPENDENT_AMBULATORY_CARE_PROVIDER_SITE_OTHER): Payer: Medicare Other | Admitting: Cardiology

## 2016-05-11 VITALS — BP 128/72 | HR 66 | Ht 70.0 in | Wt 190.1 lb

## 2016-05-11 DIAGNOSIS — I25810 Atherosclerosis of coronary artery bypass graft(s) without angina pectoris: Secondary | ICD-10-CM | POA: Diagnosis not present

## 2016-05-11 DIAGNOSIS — I10 Essential (primary) hypertension: Secondary | ICD-10-CM | POA: Diagnosis not present

## 2016-05-11 DIAGNOSIS — I4891 Unspecified atrial fibrillation: Secondary | ICD-10-CM | POA: Diagnosis not present

## 2016-05-11 LAB — LIPID PANEL
CHOLESTEROL: 121 mg/dL (ref <200)
HDL: 42 mg/dL (ref >40)
LDL Cholesterol: 45 mg/dL (ref <100)
TRIGLYCERIDES: 171 mg/dL — AB (ref <150)
Total CHOL/HDL Ratio: 2.9 Ratio (ref <5.0)
VLDL: 34 mg/dL — ABNORMAL HIGH (ref <30)

## 2016-05-11 MED ORDER — LISINOPRIL 5 MG PO TABS: 5.0000 mg | ORAL_TABLET | Freq: Every day | ORAL | 3 refills | Status: DC

## 2016-05-11 MED ORDER — ATORVASTATIN CALCIUM 40 MG PO TABS: 40.0000 mg | ORAL_TABLET | Freq: Every day | ORAL | 3 refills | Status: DC

## 2016-05-11 MED ORDER — METOPROLOL TARTRATE 25 MG PO TABS: 12.5000 mg | ORAL_TABLET | Freq: Two times a day (BID) | ORAL | 3 refills | Status: DC

## 2016-05-11 NOTE — Patient Instructions (Signed)
Medication Instructions:  Your physician recommends that you continue on your current medications as directed. Please refer to the Current Medication list given to you today.   Labwork: Lipid profile today  Testing/Procedures: None   Follow-Up: Your physician wants you to follow-up in: 1 year with Dr End. (November 2018).  You will receive a reminder letter in the mail two months in advance. If you don't receive a letter, please call our office to schedule the follow-up appointment.   Any Other Special Instructions Will Be Listed Below (If Applicable).     If you need a refill on your cardiac medications before your next appointment, please call your pharmacy.

## 2016-05-12 NOTE — Progress Notes (Signed)
Patient ID: [name] [name], male   DOB: 04/15/1946, 70 y.o.   MRN: [id number] PCP: Dr. [name]  70 yo with history of HTN, COPD and smoking initially presented with exertional chest pain.  Left heart cath was done, showing severe 3 vessel disease.  Patient therefore underwent CABG in 10/11.  Postoperative course was complicated by transient atrial fibrillation.  He continues in sinus rhythm today.  He has been doing well in general.  He is now retired.  He has stayed off tobacco.  He is walking several miles a day for exercise.  No significant exertional dyspnea or chest pain with this.  Very mild dyspnea with walking up a hill.     He has been treated over the last year for GU transitional cell cancer with pulmonary mets.  He has completed chemotherapy and is now being treated with Pembrolizumab.   ECG: NSR, normal  Labs (7/11): LDL 122, HDL 44, K 4.1, creatinine 0.9 Labs (11/11): TSH 6.34 (increased), free T3 low, free T4 normal, LFTs normal, K 4.4, creatinine 1.2 Labs (1/12): LDL 74, HDL 42, TSH normal Labs (7/12): K 3.8, creatinine 0.8, LDL 44, HDL 41 Labs (12/12): K 4.1, creatinine 0.8, LDL 58, HDL 36 Labs (11/13): LDL 46, HDL 35, K 3.9, creatinine 0.9 Labs (9/16): LDL 61, HDL 39.1, K 3.9, creatinine 0.96 Labs (11/17): K 4.3, creatinine 1.3  Allergies (verified):  No Known Drug Allergies  Past Medical History: 1. COPD: quit smoking 10/11.  2. ERECTILE DYSFUNCTION 3. HYPERTENSION  4. GERD  5. CAD: Exertional chest pain prompted LHC (10/11) showing EF 55%, mild inferior hypokinesis, 90% prox RCA, 70% mid RCA, 80% distal RCA, 70% ostial PDA, 70% mPLV, 90% mid OM1 (large), 90-95% prox LAD.  Patient had CABG with LIMA-LAD, SVG-OM1, seq SVG-PDA/PLV.  6. Atrial fibrillation: Brief, post-op CABG.  7. Left upper pole kidney transitional cell CA with pulmonary mets (2017 diagnosis): Cisplatin/gemcitabine chemotherapy initially, now getting Pembrolizumab.   Family History: Family History of  Arthritis, parents Family History Hypertension, parents Father: Died of lung cancer Mother: Died of unknown causes in a rest home - sounds like possible sudden cardiac death at age 1. Siblings: None  Social History: Quit smoking 10/11.  Alcohol use-no Drug use-no Regular exercise-yes Caffeinated beverages - yes Dentures - Yes Marital Status: Married Children:  3 who are well  Retired from [employer].   Current Outpatient Prescriptions  Medication Sig Dispense Refill  . acetaminophen (TYLENOL) 325 MG tablet Take 650 mg by mouth every 6 (six) hours as needed for moderate pain or headache.     [name] atorvastatin (LIPITOR) 40 MG tablet Take 1 tablet (40 mg total) by mouth daily. 90 tablet 3  . Cholecalciferol (VITAMIN D) 1000 UNITS capsule Take 1,000 Units by mouth daily.      . diphenhydrAMINE (SOMINEX) 25 MG tablet Take 25 mg by mouth at bedtime.    . docusate sodium (COLACE) 100 MG capsule Take 100 mg by mouth 2 (two) times daily as needed (constipation).    [name] lidocaine-prilocaine (EMLA) cream Apply to port-a-cath 1-2 hours prior to acces. Cover with saran wrap. 30 g 1  . lisinopril (PRINIVIL,ZESTRIL) 5 MG tablet Take 1 tablet (5 mg total) by mouth daily. 90 tablet 3  . metoprolol tartrate (LOPRESSOR) 25 MG tablet Take 0.5 tablets (12.5 mg total) by mouth 2 (two) times daily. 90 tablet 3  . nitroGLYCERIN (NITROSTAT) 0.4 MG SL tablet Place 1 tablet (0.4 mg total) under the tongue every 5 (five)  minutes as needed. May repeat up to 3 doses. 100 tablet 3  . polycarbophil (FIBERCON) 625 MG tablet Take 625 mg by mouth daily.     . ranitidine (ZANTAC) 300 MG capsule Take 300 mg by mouth every evening.    . sildenafil (VIAGRA) 50 MG tablet Take 50 mg by mouth as needed for erectile dysfunction.      No current facility-administered medications for this visit.     BP 128/72   Pulse 66   Ht 5\' 10"  (1.778 m)   Wt 190 lb 1.9 oz (86.2 kg)   BMI 27.28 kg/m  General:  Well developed, well  nourished, in no acute distress. Neck:  Neck supple, no JVD. No masses, thyromegaly or abnormal cervical nodes. Lungs:  Clear bilaterally to auscultation and percussion. Heart:  Non-displaced PMI, chest non-tender; regular rate and rhythm, S1, S2 without murmurs, rubs. +S4. Carotid upstroke normal, no bruit.  Pedals normal pulses. No edema, no varicosities. Abdomen:  Bowel sounds positive; abdomen soft and non-tender without masses, organomegaly, or hernias noted. No hepatosplenomegaly. Extremities:  No clubbing or cyanosis. Neurologic:  Alert and oriented x 3. Psych:  Normal affect.  Assessment/Plan:  Hyperlipidemia  Goal LDL < 70, check lipids today.  CAD Stable with no significant ischemic symptoms.  Continue ASA 81, statin, metoprolol, and lisinopril. ATRIAL FIBRILLATION  No atrial fibrillation has been noted except immediately post-operatively after CABG.  Followup in 1 year with Dr. Saunders Revel given my transition to CHF clinic.   Loralie Champagne 05/12/2016

## 2016-05-28 ENCOUNTER — Ambulatory Visit (HOSPITAL_BASED_OUTPATIENT_CLINIC_OR_DEPARTMENT_OTHER): Payer: Medicare Other | Admitting: Oncology

## 2016-05-28 ENCOUNTER — Telehealth: Payer: Self-pay | Admitting: *Deleted

## 2016-05-28 ENCOUNTER — Ambulatory Visit (HOSPITAL_BASED_OUTPATIENT_CLINIC_OR_DEPARTMENT_OTHER): Payer: Medicare Other

## 2016-05-28 ENCOUNTER — Ambulatory Visit: Payer: Medicare Other

## 2016-05-28 ENCOUNTER — Telehealth: Payer: Self-pay | Admitting: Oncology

## 2016-05-28 ENCOUNTER — Other Ambulatory Visit (HOSPITAL_BASED_OUTPATIENT_CLINIC_OR_DEPARTMENT_OTHER): Payer: Medicare Other

## 2016-05-28 ENCOUNTER — Other Ambulatory Visit: Payer: Medicare Other

## 2016-05-28 VITALS — BP 128/72 | HR 77 | Temp 98.2°F | Resp 17 | Ht 70.0 in | Wt 187.7 lb

## 2016-05-28 DIAGNOSIS — C642 Malignant neoplasm of left kidney, except renal pelvis: Secondary | ICD-10-CM

## 2016-05-28 DIAGNOSIS — C689 Malignant neoplasm of urinary organ, unspecified: Secondary | ICD-10-CM

## 2016-05-28 DIAGNOSIS — Z5112 Encounter for antineoplastic immunotherapy: Secondary | ICD-10-CM

## 2016-05-28 DIAGNOSIS — C7800 Secondary malignant neoplasm of unspecified lung: Secondary | ICD-10-CM

## 2016-05-28 DIAGNOSIS — Z95828 Presence of other vascular implants and grafts: Secondary | ICD-10-CM

## 2016-05-28 DIAGNOSIS — J069 Acute upper respiratory infection, unspecified: Secondary | ICD-10-CM | POA: Diagnosis not present

## 2016-05-28 DIAGNOSIS — C652 Malignant neoplasm of left renal pelvis: Secondary | ICD-10-CM | POA: Diagnosis not present

## 2016-05-28 LAB — COMPREHENSIVE METABOLIC PANEL
ALT: 26 U/L (ref 0–55)
AST: 43 U/L — AB (ref 5–34)
Albumin: 3.1 g/dL — ABNORMAL LOW (ref 3.5–5.0)
Alkaline Phosphatase: 85 U/L (ref 40–150)
Anion Gap: 10 mEq/L (ref 3–11)
BILIRUBIN TOTAL: 0.98 mg/dL (ref 0.20–1.20)
BUN: 20.1 mg/dL (ref 7.0–26.0)
CHLORIDE: 100 meq/L (ref 98–109)
CO2: 24 meq/L (ref 22–29)
CREATININE: 1.3 mg/dL (ref 0.7–1.3)
Calcium: 9.3 mg/dL (ref 8.4–10.4)
EGFR: 54 mL/min/{1.73_m2} — ABNORMAL LOW (ref >90)
GLUCOSE: 203 mg/dL — AB (ref 70–140)
Potassium: 3.7 mEq/L (ref 3.5–5.1)
SODIUM: 135 meq/L — AB (ref 136–145)
TOTAL PROTEIN: 6.9 g/dL (ref 6.4–8.3)

## 2016-05-28 LAB — CBC WITH DIFFERENTIAL/PLATELET
BASO%: 0.5 % (ref 0.0–2.0)
Basophils Absolute: 0 10*3/uL (ref 0.0–0.1)
EOS%: 1.8 % (ref 0.0–7.0)
Eosinophils Absolute: 0.1 10*3/uL (ref 0.0–0.5)
HCT: 36.2 % — ABNORMAL LOW (ref 38.4–49.9)
HGB: 12.2 g/dL — ABNORMAL LOW (ref 13.0–17.1)
LYMPH%: 12 % — AB (ref 14.0–49.0)
MCH: 32.5 pg (ref 27.2–33.4)
MCHC: 33.6 g/dL (ref 32.0–36.0)
MCV: 96.7 fL (ref 79.3–98.0)
MONO#: 0.9 10*3/uL (ref 0.1–0.9)
MONO%: 11.8 % (ref 0.0–14.0)
NEUT%: 73.9 % (ref 39.0–75.0)
NEUTROS ABS: 5.8 10*3/uL (ref 1.5–6.5)
PLATELETS: 168 10*3/uL (ref 140–400)
RBC: 3.74 10*6/uL — AB (ref 4.20–5.82)
RDW: 12.5 % (ref 11.0–14.6)
WBC: 7.8 10*3/uL (ref 4.0–10.3)
lymph#: 0.9 10*3/uL (ref 0.9–3.3)

## 2016-05-28 MED ORDER — HEPARIN SOD (PORK) LOCK FLUSH 100 UNIT/ML IV SOLN
500.0000 [IU] | Freq: Once | INTRAVENOUS | Status: AC | PRN
  Administered 2016-05-28: 500 [IU]
  Filled 2016-05-28: qty 5

## 2016-05-28 MED ORDER — BENZONATATE 200 MG PO CAPS: 200.0000 mg | ORAL_CAPSULE | Freq: Three times a day (TID) | ORAL | 0 refills | Status: DC | PRN

## 2016-05-28 MED ORDER — SODIUM CHLORIDE 0.9 % IV SOLN
Freq: Once | INTRAVENOUS | Status: AC
  Administered 2016-05-28: 10:00:00 via INTRAVENOUS

## 2016-05-28 MED ORDER — SODIUM CHLORIDE 0.9% FLUSH
10.0000 mL | INTRAVENOUS | Status: DC | PRN
  Administered 2016-05-28: 10 mL
  Filled 2016-05-28: qty 10

## 2016-05-28 MED ORDER — PSEUDOEPHEDRINE-GUAIFENESIN ER 60-600 MG PO TB12: 1.0000 | ORAL_TABLET | Freq: Two times a day (BID) | ORAL | 0 refills | Status: DC

## 2016-05-28 MED ORDER — SODIUM CHLORIDE 0.9 % IJ SOLN
10.0000 mL | INTRAMUSCULAR | Status: DC | PRN
  Administered 2016-05-28: 10 mL via INTRAVENOUS
  Filled 2016-05-28: qty 10

## 2016-05-28 MED ORDER — SODIUM CHLORIDE 0.9 % IV SOLN
200.0000 mg | Freq: Once | INTRAVENOUS | Status: AC
  Administered 2016-05-28: 200 mg via INTRAVENOUS
  Filled 2016-05-28: qty 8

## 2016-05-28 NOTE — Telephone Encounter (Signed)
Per LOS I have scheduled appts and notified the scheduler 

## 2016-05-28 NOTE — Progress Notes (Signed)
Hematology and Oncology Follow Up Visit  [name] [name] [id number] [birth year] 70 y.o. 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 He presented with 4.9 cm mass of the upper pole of the left kidney with documented pulmonary metastasis. Neurological confirmation done on 04/15/2015 with a biopsy showed urothelial carcinoma.   Prior Therapy:   Status post biopsy of the left renal mass done on 04/15/2015. He is status post Port-A-Cath insertion on 05/21/2015. Systemic chemotherapy in the form of cisplatin and gemcitabine started on 05/27/2015. He is status post 6 cycles completed on 09/16/2015. CT scan on 01/06/2016 showed progression of disease.  Current therapy: Pembrolizumab 200 mg every 3 weeks cycle 1 on 02/13/2016. He is here for cycle 6 of therapy.  Interim History:  Mr. Mockler presents today for a follow-up visit. Since the last visit, he reports upper respiratory tract infection started in the last 5 days. He is reporting, nonproductive cough and congestion. He denied any fevers, chills or arthralgias. His by mouth intake remains adequate at this time. Is able to drink and maintain hydration. His mobility is a little bit limited but for the most part not dramatically changed. He was able to travel to [facility] over Thanksgiving holiday without any limitations.    He tolerated the last cycle of Pembrolizumab without any denied any nausea, abdominal pain or respiratory distress. He denied any wheezing or hemoptysis. He denied any back pain or shoulder pain. He denied any pelvic pain. His performance status and quality of life remains maintained and actually improved on this therapy.   He does not report any headaches, blurry vision, syncope or seizures. He does not report any fevers, chills, or sweats. He does not report any chest pain,  palpitation, orthopnea or leg edema. He does not report any wheezing or hemoptysis. He does not report any  vomiting, abdominal pain does report occasional dyspepsia. He does not report any frequency, urgency or hesitancy. He does not report any skeletal complaints. Remaining review of systems unremarkable.   Medications: I have reviewed the patient's current medications.  Current Outpatient Prescriptions  Medication Sig Dispense Refill  . acetaminophen (TYLENOL) 325 MG tablet Take 650 mg by mouth every 6 (six) hours as needed for moderate pain or headache.     [name] atorvastatin (LIPITOR) 40 MG tablet Take 1 tablet (40 mg total) by mouth daily. 90 tablet 3  . Cholecalciferol (VITAMIN D) 1000 UNITS capsule Take 1,000 Units by mouth daily.      . diphenhydrAMINE (SOMINEX) 25 MG tablet Take 25 mg by mouth at bedtime.    . docusate sodium (COLACE) 100 MG capsule Take 100 mg by mouth 2 (two) times daily as needed (constipation).    [name] lidocaine-prilocaine (EMLA) cream Apply to port-a-cath 1-2 hours prior to acces. Cover with saran wrap. 30 g 1  . lisinopril (PRINIVIL,ZESTRIL) 5 MG tablet Take 1 tablet (5 mg total) by mouth daily. 90 tablet 3  . metoprolol tartrate (LOPRESSOR) 25 MG tablet Take 0.5 tablets (12.5 mg total) by mouth 2 (two) times daily. 90 tablet 3  . nitroGLYCERIN (NITROSTAT) 0.4 MG SL tablet Place 1 tablet (0.4 mg total) under the tongue every 5 (five) minutes as needed. May repeat up to 3 doses. 100 tablet 3  . polycarbophil (FIBERCON) 625 MG tablet Take 625 mg by mouth daily.     . ranitidine (ZANTAC) 300 MG  capsule Take 300 mg by mouth every evening.    . sildenafil (VIAGRA) 50 MG tablet Take 50 mg by mouth as needed for erectile dysfunction.     . benzonatate (TESSALON) 200 MG capsule Take 1 capsule (200 mg total) by mouth 3 (three) times daily as needed for cough. 20 capsule 0  . pseudoephedrine-guaifenesin (MUCINEX D) 60-600 MG 12 hr tablet Take 1 tablet by mouth every 12 (twelve)  hours. 30 tablet 0   No current facility-administered medications for this visit.      Allergies:  Allergies  Allergen Reactions  . Hydrocodone Other (See Comments)    Became too sedated    Past Medical History, Surgical history, Social history, and Family History were reviewed and updated.   Physical Exam: Blood pressure 128/72, pulse 77, temperature 98.2 F (36.8 C), temperature source Oral, resp. rate 17, height 5\' 10"  (1.778 m), weight 187 lb 11.2 oz (85.1 kg), SpO2 100 %. ECOG: 0 General appearance: Alert, awake gentleman without distress. Head: Normocephalic, without obvious abnormality no oral erythema or induration. No pus or pharyngitis. Neck: no adenopathy no thyroid masses. Lymph nodes: Cervical, supraclavicular, and axillary nodes normal. Heart:regular rate and rhythm, S1, S2 normal, no murmur, click, rub or gallop Lung:chest clear, no wheezing, rales, normal symmetric air entry Abdomin: soft, non-tender, without masses or organomegaly no rebound or guarding. EXT:no erythema, induration, or nodules Skin: No rashes or lesions.  Lab Results: Lab Results  Component Value Date   WBC 7.8 05/28/2016   HGB 12.2 (L) 05/28/2016   HCT 36.2 (L) 05/28/2016   MCV 96.7 05/28/2016   PLT 168 05/28/2016     Chemistry      Component Value Date/Time   NA 137 05/07/2016 1122   K 4.3 05/07/2016 1122   CL 104 05/21/2015 0840   CO2 26 05/07/2016 1122   BUN 22.9 05/07/2016 1122   CREATININE 1.3 05/07/2016 1122      Component Value Date/Time   CALCIUM 9.4 05/07/2016 1122   ALKPHOS 92 05/07/2016 1122   AST 28 05/07/2016 1122   ALT 25 05/07/2016 1122   BILITOT 0.97 05/07/2016 1122       Impression and Plan:   70 year old gentleman with the following issues:  1. 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  He did have a pathological confirmation done on 04/15/2015 with the specimen showed urothelial carcinoma.  He is status post 6 cycles of therapy completed in March 2017. CT scan on 04/11/2017showed continuous response to therapy. He had further decrease in his bilateral pulmonary nodules as well as decrease in the left upper pole renal lesion.  Repeat CT scan on 01/06/2016 showed mild progression of disease especially in his primary tumor. His pulmonary nodules have remained stable.   He is currently receiving Pembrolizumab and continues to tolerated very well. CT scan on 04/14/2016 showed excellent response after 3 cycles of therapy.   He continues to benefit clinically from this treatment and risks and benefits of continuing were reviewed today. He is agreeable to proceed with today's treatment and we will repeat imaging studies in January 2018.  2. Nausea and GI complication prophylaxis: Antiemetics regimen is available to him as needed. No issues noted at this time.  3. IV access: Port-A-Cath remained in use without any complications.  4. Dermatitis: Continues to be manageable at  this time without exacerbation.  5. Upper respiratory tract infection: Appears to be viral in nature without any evidence of bacterial infection. No flulike symptoms at this time. I have prescribed him Mucinex as well as Best boy for symptomatic management. If he develops fevers or signs or symptoms of sinusitis, antibiotics will be prescribed.  6. Autoimmune surveillance: Thyroid function will be checked periodically. His TSH was elevated and we will repeated with the next visit as well as repeat T3 and T4.  7. Follow-up: Will be in 3 weeks for the next cycle of therapy.  [id number], MD 12/1/20179:20 AM

## 2016-05-28 NOTE — Patient Instructions (Signed)
[facility] Discharge Instructions for Patients Receiving Chemotherapy  Today you received the following chemotherapy agents: Keytruda   To help prevent nausea and vomiting after your treatment, we encourage you to take your nausea medication as directed    If you develop nausea and vomiting that is not controlled by your nausea medication, call the clinic.   BELOW ARE SYMPTOMS THAT SHOULD BE REPORTED IMMEDIATELY:  *FEVER GREATER THAN 100.5 F  *CHILLS WITH OR WITHOUT FEVER  NAUSEA AND VOMITING THAT IS NOT CONTROLLED WITH YOUR NAUSEA MEDICATION  *UNUSUAL SHORTNESS OF BREATH  *UNUSUAL BRUISING OR BLEEDING  TENDERNESS IN MOUTH AND THROAT WITH OR WITHOUT PRESENCE OF ULCERS  *URINARY PROBLEMS  *BOWEL PROBLEMS  UNUSUAL RASH Items with * indicate a potential emergency and should be followed up as soon as possible.  Feel free to call the clinic you have any questions or concerns. The clinic phone number is [phone].  Please show the CHEMO ALERT CARD at check-in to the Emergency Department and triage nurse.   

## 2016-05-28 NOTE — Telephone Encounter (Signed)
Message sent to chemo scheduler to be added, per 05/28/16 los. Appointments scheduled per 05/28/16 los. A copy of the AVS report and appointment schedule was given to the patient, per 05/28/16 los.

## 2016-06-09 DIAGNOSIS — C642 Malignant neoplasm of left kidney, except renal pelvis: Secondary | ICD-10-CM | POA: Diagnosis not present

## 2016-06-09 DIAGNOSIS — R350 Frequency of micturition: Secondary | ICD-10-CM | POA: Diagnosis not present

## 2016-06-09 DIAGNOSIS — R310 Gross hematuria: Secondary | ICD-10-CM | POA: Diagnosis not present

## 2016-06-14 ENCOUNTER — Telehealth: Payer: Self-pay | Admitting: *Deleted

## 2016-06-14 DIAGNOSIS — R310 Gross hematuria: Secondary | ICD-10-CM | POA: Diagnosis not present

## 2016-06-14 NOTE — Telephone Encounter (Signed)
Patient notified

## 2016-06-14 NOTE — Telephone Encounter (Signed)
No problem from my stand point.

## 2016-06-14 NOTE — Telephone Encounter (Signed)
FYI "I have been in touch with urologist for problem with blood in urine past several days.  Urologist just called me to come in today to receive Gentamicin for a total of three days.  I'm on Keytruda and want to make Dr. [name] aware and make sure this will not cause any new problems.  I'll see urologist at 1:30 pm today for first antibiotic."

## 2016-06-15 DIAGNOSIS — R310 Gross hematuria: Secondary | ICD-10-CM | POA: Diagnosis not present

## 2016-06-16 DIAGNOSIS — R310 Gross hematuria: Secondary | ICD-10-CM | POA: Diagnosis not present

## 2016-06-17 ENCOUNTER — Encounter: Payer: Self-pay | Admitting: *Deleted

## 2016-06-17 ENCOUNTER — Telehealth: Payer: Self-pay | Admitting: *Deleted

## 2016-06-17 ENCOUNTER — Other Ambulatory Visit: Payer: Medicare Other

## 2016-06-17 ENCOUNTER — Emergency Department (HOSPITAL_COMMUNITY): Payer: Medicare Other

## 2016-06-17 ENCOUNTER — Observation Stay (HOSPITAL_COMMUNITY)
Admission: EM | Admit: 2016-06-17 | Discharge: 2016-06-19 | Disposition: A | Discharge status: Home / Self Care | Payer: Medicare Other | Attending: Internal Medicine | Admitting: Internal Medicine

## 2016-06-17 ENCOUNTER — Encounter (HOSPITAL_COMMUNITY): Payer: Self-pay

## 2016-06-17 DIAGNOSIS — Z79899 Other long term (current) drug therapy: Secondary | ICD-10-CM | POA: Diagnosis not present

## 2016-06-17 DIAGNOSIS — Z7982 Long term (current) use of aspirin: Secondary | ICD-10-CM | POA: Diagnosis not present

## 2016-06-17 DIAGNOSIS — I10 Essential (primary) hypertension: Secondary | ICD-10-CM | POA: Diagnosis not present

## 2016-06-17 DIAGNOSIS — K219 Gastro-esophageal reflux disease without esophagitis: Secondary | ICD-10-CM | POA: Diagnosis present

## 2016-06-17 DIAGNOSIS — J449 Chronic obstructive pulmonary disease, unspecified: Secondary | ICD-10-CM | POA: Diagnosis not present

## 2016-06-17 DIAGNOSIS — Z87891 Personal history of nicotine dependence: Secondary | ICD-10-CM | POA: Diagnosis not present

## 2016-06-17 DIAGNOSIS — D62 Acute posthemorrhagic anemia: Principal | ICD-10-CM | POA: Diagnosis present

## 2016-06-17 DIAGNOSIS — D649 Anemia, unspecified: Secondary | ICD-10-CM

## 2016-06-17 DIAGNOSIS — I2510 Atherosclerotic heart disease of native coronary artery without angina pectoris: Secondary | ICD-10-CM | POA: Insufficient documentation

## 2016-06-17 DIAGNOSIS — Z8551 Personal history of malignant neoplasm of bladder: Secondary | ICD-10-CM | POA: Diagnosis not present

## 2016-06-17 DIAGNOSIS — E785 Hyperlipidemia, unspecified: Secondary | ICD-10-CM | POA: Diagnosis present

## 2016-06-17 DIAGNOSIS — R310 Gross hematuria: Secondary | ICD-10-CM | POA: Diagnosis present

## 2016-06-17 DIAGNOSIS — I4891 Unspecified atrial fibrillation: Secondary | ICD-10-CM | POA: Diagnosis present

## 2016-06-17 DIAGNOSIS — R319 Hematuria, unspecified: Secondary | ICD-10-CM

## 2016-06-17 DIAGNOSIS — Z951 Presence of aortocoronary bypass graft: Secondary | ICD-10-CM | POA: Insufficient documentation

## 2016-06-17 DIAGNOSIS — C642 Malignant neoplasm of left kidney, except renal pelvis: Secondary | ICD-10-CM

## 2016-06-17 DIAGNOSIS — C689 Malignant neoplasm of urinary organ, unspecified: Secondary | ICD-10-CM | POA: Diagnosis present

## 2016-06-17 DIAGNOSIS — C673 Malignant neoplasm of anterior wall of bladder: Secondary | ICD-10-CM | POA: Diagnosis not present

## 2016-06-17 LAB — CBC WITH DIFFERENTIAL/PLATELET
BASOS PCT: 0 %
Basophils Absolute: 0 10*3/uL (ref 0.0–0.1)
EOS ABS: 0.1 10*3/uL (ref 0.0–0.7)
EOS PCT: 2 %
HCT: 24 % — ABNORMAL LOW (ref 39.0–52.0)
Hemoglobin: 8 g/dL — ABNORMAL LOW (ref 13.0–17.0)
Lymphocytes Relative: 23 %
Lymphs Abs: 1.8 10*3/uL (ref 0.7–4.0)
MCH: 32.9 pg (ref 26.0–34.0)
MCHC: 33.3 g/dL (ref 30.0–36.0)
MCV: 98.8 fL (ref 78.0–100.0)
MONO ABS: 0.6 10*3/uL (ref 0.1–1.0)
MONOS PCT: 7 %
NEUTROS PCT: 68 %
Neutro Abs: 5.5 10*3/uL (ref 1.7–7.7)
Platelets: 164 10*3/uL (ref 150–400)
RBC: 2.43 MIL/uL — ABNORMAL LOW (ref 4.22–5.81)
RDW: 14.2 % (ref 11.5–15.5)
WBC: 8.1 10*3/uL (ref 4.0–10.5)

## 2016-06-17 LAB — URINALYSIS, ROUTINE W REFLEX MICROSCOPIC
BILIRUBIN URINE: NEGATIVE
Bacteria, UA: NONE SEEN
Glucose, UA: 50 mg/dL — AB
Ketones, ur: 20 mg/dL — AB
LEUKOCYTES UA: NEGATIVE
NITRITE: NEGATIVE
PH: 6 (ref 5.0–8.0)
Protein, ur: 100 mg/dL — AB
SPECIFIC GRAVITY, URINE: 1.015 (ref 1.005–1.030)
SQUAMOUS EPITHELIAL / LPF: NONE SEEN
WBC, UA: NONE SEEN WBC/hpf (ref 0–5)

## 2016-06-17 LAB — PREPARE RBC (CROSSMATCH)

## 2016-06-17 LAB — BASIC METABOLIC PANEL
Anion gap: 8 (ref 5–15)
BUN: 23 mg/dL — ABNORMAL HIGH (ref 6–20)
CALCIUM: 8.8 mg/dL — AB (ref 8.9–10.3)
CO2: 25 mmol/L (ref 22–32)
CREATININE: 1.53 mg/dL — AB (ref 0.61–1.24)
Chloride: 103 mmol/L (ref 101–111)
GFR calc non Af Amer: 44 mL/min — ABNORMAL LOW (ref >60)
GFR, EST AFRICAN AMERICAN: 51 mL/min — AB (ref >60)
Glucose, Bld: 101 mg/dL — ABNORMAL HIGH (ref 65–99)
Potassium: 4 mmol/L (ref 3.5–5.1)
SODIUM: 136 mmol/L (ref 135–145)

## 2016-06-17 LAB — ABO/RH: ABO/RH(D): B POS

## 2016-06-17 MED ORDER — SODIUM CHLORIDE 0.9 % IV SOLN: Freq: Once | INTRAVENOUS | Status: DC

## 2016-06-17 MED ORDER — DOCUSATE SODIUM 100 MG PO CAPS
100.0000 mg | ORAL_CAPSULE | Freq: Two times a day (BID) | ORAL | Status: DC | PRN
  Administered 2016-06-18: 100 mg via ORAL

## 2016-06-17 MED ORDER — ONDANSETRON HCL 4 MG PO TABS: 4.0000 mg | ORAL_TABLET | Freq: Four times a day (QID) | ORAL | Status: DC | PRN

## 2016-06-17 MED ORDER — ONDANSETRON HCL 4 MG/2ML IJ SOLN: 4.0000 mg | Freq: Four times a day (QID) | INTRAMUSCULAR | Status: DC | PRN

## 2016-06-17 MED ORDER — IOPAMIDOL (ISOVUE-300) INJECTION 61%
INTRAVENOUS | Status: AC
  Filled 2016-06-17: qty 100

## 2016-06-17 MED ORDER — FAMOTIDINE 20 MG PO TABS
20.0000 mg | ORAL_TABLET | Freq: Two times a day (BID) | ORAL | Status: DC | PRN
  Administered 2016-06-17: 20 mg via ORAL
  Filled 2016-06-17: qty 1

## 2016-06-17 MED ORDER — IOPAMIDOL (ISOVUE-300) INJECTION 61%
100.0000 mL | Freq: Once | INTRAVENOUS | Status: AC | PRN
  Administered 2016-06-17: 80 mL via INTRAVENOUS

## 2016-06-17 MED ORDER — ATORVASTATIN CALCIUM 40 MG PO TABS
40.0000 mg | ORAL_TABLET | Freq: Every day | ORAL | Status: DC
  Administered 2016-06-18 – 2016-06-19 (×2): 40 mg via ORAL
  Filled 2016-06-17 (×2): qty 1

## 2016-06-17 MED ORDER — ACETAMINOPHEN 325 MG PO TABS: 650.0000 mg | ORAL_TABLET | Freq: Four times a day (QID) | ORAL | Status: DC | PRN

## 2016-06-17 MED ORDER — TRAZODONE HCL 50 MG PO TABS
25.0000 mg | ORAL_TABLET | Freq: Every evening | ORAL | Status: DC | PRN
Start: 2016-06-17 — End: 2016-06-19

## 2016-06-17 NOTE — ED Notes (Signed)
Report given to [facility] on 3W. She was informed that blood is ready for pt

## 2016-06-17 NOTE — ED Notes (Signed)
Nurse stated she is going to collect labs

## 2016-06-17 NOTE — ED Triage Notes (Signed)
Pt c/o hematuria and passing clots x 8 days and reports abnormal lab value yesterday (Hgb 8.2).  Denies pain.  Hx of tumor in L kidney and bladder CA.  Denies chemo and radiation.  Pt is followed by [facility] and [facility].  Sts Hgb was 12-ish last week.

## 2016-06-17 NOTE — Telephone Encounter (Signed)
He needs to go to ED

## 2016-06-17 NOTE — Telephone Encounter (Signed)
Pt says his Urologist told him to go to ED for Hgb dropped to 8.2 (drawn at their office yesterday)  Previous Hgb 12.2 on 05/28/16.   Pt asks if Dr. [name] wants him to come to our office or go to ED as directed by Urologist office?

## 2016-06-17 NOTE — H&P (Signed)
[facility] History and Physical  [name] [name] [id number] DOB: Dec 27, 1945 DOA: 06/17/2016  Referring physician:  PCP: [name], MD   Chief Complaint: "I started peeing blood."  HPI: [name] [name] is a 70 y.o. male  with past medical history significant for bladder cancer, coronary artery disease, COPD, reflux, hypertension and atrial fibrillation presents to the emergency room complaining hematuria. Patient was being seen by his urology office for hematuria. Getting treatment for suspected urinary tract infection with gentamicin intramuscular injections. Lab work showed the patient was anemic with a hemoglobin of 8.2. Patient was directed to the emergency room for further evaluation. Patient states that over the last few days he's developed worsening weakness and dizziness.  Patient denies fever, chills, nausea, vomiting, diarrhea, headache, shortness of breath and chest pain.   Review of Systems:  As per HPI otherwise 10 point review of systems negative.    Past Medical History:  Diagnosis Date  . Atrial fibrillation (HCC)    Breif, post-op CABG  . bladder ca dx'd 03/2015  . CAD (coronary artery disease)    Exertional chest pain prompted LHC 10/11 showing EF 55%, mild inferior hypokinesis, 90% prox RCA, 70% mid RCA, 80% distal RCA, 70% ostial PDA, 70% mPLV, 90% mid OM1 (large), 90-95% prox LAD. Pt had CABG with LIMA-LAD, SVG-OMG1, seq SVG-PDA/PLV  . COPD (chronic obstructive pulmonary disease) ([place])    Quit smoking 10/11  . ED (erectile dysfunction) of organic origin   . GERD (gastroesophageal reflux disease)   . Hypertension    Past Surgical History:  Procedure Laterality Date  . CORONARY ARTERY BYPASS GRAFT     LIMA-LAD, SVG-OM1, seq SVG-PDA/PLV  . CYSTOSCOPY WITH RETROGRADE PYELOGRAM, URETEROSCOPY AND STENT PLACEMENT Left 04/08/2015   Procedure: CYSTOSCOPY WITH LEFT  RETROGRADE PYELOGRAM, BALLOON DILATION LEFT URETER, LEFT URETEROSCOPY,LEFT STENT  PLACEMENT;  Surgeon: Nickie Retort, MD;  Location: [facility];  Service: Urology;  Laterality: Left;   Social History:  reports that he quit smoking about 6 years ago. He has quit using smokeless tobacco. He reports that he does not drink alcohol or use drugs.  Allergies  Allergen Reactions  . Hydrocodone Other (See Comments)    Became too sedated    Family History  Problem Relation Age of Onset  . Arthritis Mother   . Hypertension Mother   . Sudden death Mother 95  . Arthritis Father   . Hypertension Father   . Lung cancer Father   . Stroke Maternal Uncle   . Heart attack Neg Hx      Prior to Admission medications   Medication Sig Start Date End Date Taking? Authorizing Provider  acetaminophen (TYLENOL) 325 MG tablet Take 650 mg by mouth every 6 (six) hours as needed for moderate pain or headache.    Yes Historical Provider, MD  aspirin EC 81 MG tablet Take 81 mg by mouth every evening.   Yes Historical Provider, MD  atorvastatin (LIPITOR) 40 MG tablet Take 1 tablet (40 mg total) by mouth daily. Patient taking differently: Take 40 mg by mouth every morning.  05/11/16  Yes Larey Dresser, MD  Cholecalciferol (VITAMIN D) 1000 UNITS capsule Take 1,000 Units by mouth daily.     Yes Historical Provider, MD  docusate sodium (COLACE) 100 MG capsule Take 100 mg by mouth 2 (two) times daily as needed (constipation).   Yes Historical Provider, MD  lisinopril (PRINIVIL,ZESTRIL) 5 MG tablet Take 1 tablet (5 mg total) by mouth daily. 05/11/16  Yes  Larey Dresser, MD  metoprolol tartrate (LOPRESSOR) 25 MG tablet Take 0.5 tablets (12.5 mg total) by mouth 2 (two) times daily. 05/11/16  Yes Larey Dresser, MD  polycarbophil (FIBERCON) 625 MG tablet Take 625 mg by mouth 2 (two) times daily.    Yes Historical Provider, MD  sildenafil (VIAGRA) 50 MG tablet Take 50 mg by mouth as needed for erectile dysfunction.    Yes Historical Provider, MD  benzonatate (TESSALON) 200 MG capsule Take 1 capsule  (200 mg total) by mouth 3 (three) times daily as needed for cough. Patient not taking: Reported on 06/17/2016 05/28/16   Wyatt Portela, MD  lidocaine-prilocaine (EMLA) cream Apply to port-a-cath 1-2 hours prior to acces. Cover with saran wrap. 05/16/15   Wyatt Portela, MD  nitroGLYCERIN (NITROSTAT) 0.4 MG SL tablet Place 1 tablet (0.4 mg total) under the tongue every 5 (five) minutes as needed. May repeat up to 3 doses. 03/07/15   Larey Dresser, MD  PRESCRIPTION MEDICATION Inject 160 mg into the muscle daily. Gentamycin given for 3 days at [facility]    Historical Provider, MD  pseudoephedrine-guaifenesin [facility] D) 60-600 MG 12 hr tablet Take 1 tablet by mouth every 12 (twelve) hours. Patient not taking: Reported on 06/17/2016 05/28/16   Wyatt Portela, MD   Physical Exam: Vitals:   06/17/16 1645 06/17/16 1727 06/17/16 1815 06/17/16 1841  BP:  108/85 109/65 127/79  Pulse: 71 83 79 77  Resp: 19 16 16 16   Temp:  97.4 F (36.3 C) 97.7 F (36.5 C) 97.1 F (36.2 C)  TempSrc:  Oral Oral Oral  SpO2: 100% 100% 100% 100%  Weight:  83.2 kg (183 lb 7 oz)    Height:  5\' 10"  (1.778 m)      Wt Readings from Last 3 Encounters:  06/17/16 83.2 kg (183 lb 7 oz)  05/28/16 85.1 kg (187 lb 11.2 oz)  05/11/16 86.2 kg (190 lb 1.9 oz)    General:  Appears calm and comfortable. Alert and oriented 3 Eyes:  PERRL, EOMI, normal lids, iris ENT:  grossly normal hearing, lips & tongue Neck:  no LAD, masses or thyromegaly Cardiovascular:  RRR, no m/r/g. No LE edema.  Respiratory:  CTA bilaterally, no w/r/r. Normal respiratory effort. Abdomen:  soft, ntnd Skin:  no rash or induration seen on limited exam Musculoskeletal:  grossly normal tone BUE/BLE Psychiatric:  grossly normal mood and affect, speech fluent and appropriate Neurologic:  CN 2-12 grossly intact, moves all extremities in coordinated fashion. Hardware-port in right chest           Labs on Admission:  Basic Metabolic  Panel:  Recent Labs Lab 06/17/16 1303  NA 136  K 4.0  CL 103  CO2 25  GLUCOSE 101*  BUN 23*  CREATININE 1.53*  CALCIUM 8.8*   Liver Function Tests: No results for input(s): AST, ALT, ALKPHOS, BILITOT, PROT, ALBUMIN in the last 168 hours. No results for input(s): LIPASE, AMYLASE in the last 168 hours. No results for input(s): AMMONIA in the last 168 hours. CBC:  Recent Labs Lab 06/17/16 1303  WBC 8.1  NEUTROABS 5.5  HGB 8.0*  HCT 24.0*  MCV 98.8  PLT 164   Cardiac Enzymes: No results for input(s): CKTOTAL, CKMB, CKMBINDEX, TROPONINI in the last 168 hours.  BNP (last 3 results) No results for input(s): BNP in the last 8760 hours.  ProBNP (last 3 results) No results for input(s): PROBNP in the last 8760 hours.  Serum creatinine: 1.53 mg/dL High 06/17/16 1303 Estimated creatinine clearance: 45.1 mL/min  CBG: No results for input(s): GLUCAP in the last 168 hours.  Radiological Exams on Admission: Ct Abdomen Pelvis W Contrast  Result Date: 06/17/2016 CLINICAL DATA:  Hematuria.  Urothelial carcinoma EXAM: CT ABDOMEN AND PELVIS WITH CONTRAST TECHNIQUE: Multidetector CT imaging of the abdomen and pelvis was performed using the standard protocol following bolus administration of intravenous contrast. CONTRAST:  72mL ISOVUE-300 IOPAMIDOL (ISOVUE-300) INJECTION 61% COMPARISON:  04/14/2016 FINDINGS: Lower chest: Scar like densities noted within the left lower lobe. No pleural fluid. Hepatobiliary: Right kidney cyst measures 1.7 cm. Gallbladder appears normal. No biliary dilatation. Pancreas: Unremarkable. No pancreatic ductal dilatation or surrounding inflammatory changes. Spleen: Normal appearance of the spleen. Adrenals/Urinary Tract: Right kidney is normal. No mass or hydronephrosis. Infiltrating mass involving the upper pole of the left kidney is again identified. There is been interval expansion of the right renal pelvis which now measures 1.4 cm, image 31 of series 2.  Previously 5 mm. Filling defect within the proximal right ureter is identified which may represent clot and/or tumor. Stomach/Bowel: Small hiatal hernia. The stomach is otherwise unremarkable. Normal appearance of the small bowel loops. Thickened appendix is identified within the right iliac fossa measuring 1 cm in diameter, image 65 of series 2. Previously this measured the same. No pathologic dilatation of the colon. Vascular/Lymphatic: Aortic atherosclerosis noted. Index right periaortic lymph node measures 1.6 cm, image 31 of series 2. Previously 1 cm. No pelvic or inguinal adenopathy. Infiltrating Reproductive: Prostate is unremarkable. Other: There is no ascites or focal fluid collections within the abdomen or pelvis. Musculoskeletal: No acute or significant osseous findings. IMPRESSION: 1. Since the previous exam there has been interval expansion of the left renal pelvis which likely represents a combination of progressive tumor and blood clot. There is a filling defect within the proximal left ureter which may represent tumor and/or blood clot. 2. Metastatic lymph node within the left periaortic region has increased in size in the interval. 3. Aortic atherosclerosis. Electronically Signed   By: Kerby Moors M.D.   On: 06/17/2016 14:57    EKG: n/a  Assessment/Plan Principal Problem:   Acute blood loss anemia Active Problems:   Essential hypertension   ATRIAL FIBRILLATION   COPD (chronic obstructive pulmonary disease) (HCC)   GERD   Hyperlipidemia   Hematuria   Acute blood loss anemia from hematuria Due to cancer stg 4 urothelial Patient getting transferred to units of blood [facility] to see patient - family anxious about urology consult Hold ASA  Hypertension When necessary hydralazine 10 mg IV as needed for severe blood pressure Hold lisinopril & Lopressor, restart in AM  Afib No RVR Will monitor  Hyperlipidemia Continue statin  COPD Stable Will  monitor  GERD Prn pepcid   Code Status: FULL  DVT Prophylaxis: SCD Family Communication: Wife at bedside Disposition Plan: Pending Improvement  Status: med surg obs  Elwin Mocha, MD Family Medicine [facility] [email or web address] Password [email or web address]

## 2016-06-17 NOTE — Telephone Encounter (Signed)
Spoke with patient, per dr [name], he is to report to the E.R. D/t drop in hgb from 12 to 8. Patient verbalized understanding.

## 2016-06-17 NOTE — Progress Notes (Signed)
Nursing Note: First unit of blood in.No signs of reaction to transfusion.T-97 P-77,R-20 Bp 110/60 PO2 99% on r/a.wbb

## 2016-06-17 NOTE — ED Provider Notes (Signed)
[place] DEPT Provider Note   CSN: [id number] Arrival date & time: 06/17/16  1122     History   Chief Complaint Chief Complaint  Patient presents with  . Abnormal Lab  . Hematuria    HPI [name] [name] is a 70 y.o. male.  HPI Patient with a history of metastatic urothelial cancer with cancer to his left kidney who presents to emergency department with worsening hematuria and passing blood clots of the past 8 days.  He is found yesterday to have a hemoglobin of 8.2 was sent to the ER for further evaluation.  He follows with Dr. [name] of [facility].  He is currently been receiving IM gentamicin through the urology office of the past 3 days for suspected urinary tract infection.  He was sent to the ER doctors found to be anemic.  The patient does admit to worsening generalized weakness and exertional shortness of breath with some lightheadedness.  No chest pain or shortness of breath at rest.   Past Medical History:  Diagnosis Date  . Atrial fibrillation (HCC)    Breif, post-op CABG  . bladder ca dx'd 03/2015  . CAD (coronary artery disease)    Exertional chest pain prompted LHC 10/11 showing EF 55%, mild inferior hypokinesis, 90% prox RCA, 70% mid RCA, 80% distal RCA, 70% ostial PDA, 70% mPLV, 90% mid OM1 (large), 90-95% prox LAD. Pt had CABG with LIMA-LAD, SVG-OMG1, seq SVG-PDA/PLV  . COPD (chronic obstructive pulmonary disease) ([place])    Quit smoking 10/11  . ED (erectile dysfunction) of organic origin   . GERD (gastroesophageal reflux disease)   . Hypertension     Patient Active Problem List   Diagnosis Date Noted  . Port catheter in place 02/13/2016  . Urothelial cancer ([place]) 05/16/2015  . Renal mass, left   . Pulmonary nodule 09/19/2011  . Hematuria 09/09/2011  . Hyperlipidemia 12/22/2010  . Atherosclerosis of coronary artery bypass graft 05/14/2010  . ATRIAL FIBRILLATION 05/14/2010  . CHEST PAIN 04/10/2010  . ARM PAIN, LEFT 04/01/2010  . TOBACCO  ABUSE 01/19/2010  . COPD 01/19/2010  . HYPERTENSION 01/06/2010  . GERD 01/06/2010  . ERECTILE DYSFUNCTION, ORGANIC 01/06/2010  . CHICKENPOX, HX OF 01/06/2010    Past Surgical History:  Procedure Laterality Date  . CORONARY ARTERY BYPASS GRAFT     LIMA-LAD, SVG-OM1, seq SVG-PDA/PLV  . CYSTOSCOPY WITH RETROGRADE PYELOGRAM, URETEROSCOPY AND STENT PLACEMENT Left 04/08/2015   Procedure: CYSTOSCOPY WITH LEFT  RETROGRADE PYELOGRAM, BALLOON DILATION LEFT URETER, LEFT URETEROSCOPY,LEFT STENT PLACEMENT;  Surgeon: Nickie Retort, MD;  Location: [facility];  Service: Urology;  Laterality: Left;       Home Medications    Prior to Admission medications   Medication Sig Start Date End Date Taking? Authorizing Provider  acetaminophen (TYLENOL) 325 MG tablet Take 650 mg by mouth every 6 (six) hours as needed for moderate pain or headache.    Yes Historical Provider, MD  aspirin EC 81 MG tablet Take 81 mg by mouth every evening.   Yes Historical Provider, MD  atorvastatin (LIPITOR) 40 MG tablet Take 1 tablet (40 mg total) by mouth daily. Patient taking differently: Take 40 mg by mouth every morning.  05/11/16  Yes Larey Dresser, MD  Cholecalciferol (VITAMIN D) 1000 UNITS capsule Take 1,000 Units by mouth daily.     Yes Historical Provider, MD  docusate sodium (COLACE) 100 MG capsule Take 100 mg by mouth 2 (two) times daily as needed (constipation).   Yes Historical Provider,  MD  lisinopril (PRINIVIL,ZESTRIL) 5 MG tablet Take 1 tablet (5 mg total) by mouth daily. 05/11/16  Yes Larey Dresser, MD  metoprolol tartrate (LOPRESSOR) 25 MG tablet Take 0.5 tablets (12.5 mg total) by mouth 2 (two) times daily. 05/11/16  Yes Larey Dresser, MD  polycarbophil (FIBERCON) 625 MG tablet Take 625 mg by mouth 2 (two) times daily.    Yes Historical Provider, MD  sildenafil (VIAGRA) 50 MG tablet Take 50 mg by mouth as needed for erectile dysfunction.    Yes Historical Provider, MD  benzonatate (TESSALON) 200 MG  capsule Take 1 capsule (200 mg total) by mouth 3 (three) times daily as needed for cough. Patient not taking: Reported on 06/17/2016 05/28/16   Wyatt Portela, MD  lidocaine-prilocaine (EMLA) cream Apply to port-a-cath 1-2 hours prior to acces. Cover with saran wrap. 05/16/15   Wyatt Portela, MD  nitroGLYCERIN (NITROSTAT) 0.4 MG SL tablet Place 1 tablet (0.4 mg total) under the tongue every 5 (five) minutes as needed. May repeat up to 3 doses. 03/07/15   Larey Dresser, MD  PRESCRIPTION MEDICATION Inject 160 mg into the muscle daily. Gentamycin given for 3 days at [facility]    Historical Provider, MD  pseudoephedrine-guaifenesin [facility] D) 60-600 MG 12 hr tablet Take 1 tablet by mouth every 12 (twelve) hours. Patient not taking: Reported on 06/17/2016 05/28/16   Wyatt Portela, MD    Family History Family History  Problem Relation Age of Onset  . Arthritis Mother   . Hypertension Mother   . Sudden death Mother 74  . Arthritis Father   . Hypertension Father   . Lung cancer Father   . Stroke Maternal Uncle   . Heart attack Neg Hx     Social History Social History  Substance Use Topics  . Smoking status: Former Smoker    Quit date: 07/07/2009  . Smokeless tobacco: Former Systems developer  . Alcohol use No     Allergies   Hydrocodone   Review of Systems Review of Systems  All other systems reviewed and are negative.    Physical Exam Updated Vital Signs BP 123/75 (BP Location: Left Arm)   Pulse 71   Temp 97.7 F (36.5 C)   Resp 13   Ht 5\' 10"  (1.778 m)   Wt 180 lb (81.6 kg)   SpO2 100%   BMI 25.83 kg/m   Physical Exam  Constitutional: He is oriented to person, place, and time. He appears well-developed and well-nourished.  HENT:  Head: Normocephalic and atraumatic.  Eyes: EOM are normal.  Neck: Normal range of motion.  Cardiovascular: Normal rate, regular rhythm, normal heart sounds and intact distal pulses.   Pulmonary/Chest: Effort normal and breath sounds normal.  No respiratory distress.  Abdominal: Soft. He exhibits no distension. There is no tenderness.  Musculoskeletal: Normal range of motion.  Neurological: He is alert and oriented to person, place, and time.  Skin: Skin is warm and dry.  Psychiatric: He has a normal mood and affect. Judgment normal.  Nursing note and vitals reviewed.    ED Treatments / Results  Labs (all labs ordered are listed, but only abnormal results are displayed) Labs Reviewed  CBC WITH DIFFERENTIAL/PLATELET - Abnormal; Notable for the following:       Result Value   RBC 2.43 (*)    Hemoglobin 8.0 (*)    HCT 24.0 (*)    All other components within normal limits  BASIC METABOLIC PANEL - Abnormal; Notable  for the following:    Glucose, Bld 101 (*)    BUN 23 (*)    Creatinine, Ser 1.53 (*)    Calcium 8.8 (*)    GFR calc non Af Amer 44 (*)    GFR calc Af Amer 51 (*)    All other components within normal limits  URINALYSIS, ROUTINE W REFLEX MICROSCOPIC - Abnormal; Notable for the following:    Color, Urine RED (*)    APPearance TURBID (*)    Glucose, UA 50 (*)    Hgb urine dipstick LARGE (*)    Ketones, ur 20 (*)    Protein, ur 100 (*)    All other components within normal limits  TYPE AND SCREEN  ABO/RH   Hemoglobin  Date Value Ref Range Status  06/17/2016 8.0 (L) 13.0 - 17.0 g/dL Final   HGB  Date Value Ref Range Status  05/28/2016 12.2 (L) 13.0 - 17.1 g/dL Final  05/07/2016 13.1 13.0 - 17.1 g/dL Final  04/16/2016 12.5 (L) 13.0 - 17.1 g/dL Final  03/26/2016 12.7 (L) 13.0 - 17.1 g/dL Final    EKG  EKG Interpretation None       Radiology Ct Abdomen Pelvis W Contrast  Result Date: 06/17/2016 CLINICAL DATA:  Hematuria.  Urothelial carcinoma EXAM: CT ABDOMEN AND PELVIS WITH CONTRAST TECHNIQUE: Multidetector CT imaging of the abdomen and pelvis was performed using the standard protocol following bolus administration of intravenous contrast. CONTRAST:  71mL ISOVUE-300 IOPAMIDOL (ISOVUE-300)  INJECTION 61% COMPARISON:  04/14/2016 FINDINGS: Lower chest: Scar like densities noted within the left lower lobe. No pleural fluid. Hepatobiliary: Right kidney cyst measures 1.7 cm. Gallbladder appears normal. No biliary dilatation. Pancreas: Unremarkable. No pancreatic ductal dilatation or surrounding inflammatory changes. Spleen: Normal appearance of the spleen. Adrenals/Urinary Tract: Right kidney is normal. No mass or hydronephrosis. Infiltrating mass involving the upper pole of the left kidney is again identified. There is been interval expansion of the right renal pelvis which now measures 1.4 cm, image 31 of series 2. Previously 5 mm. Filling defect within the proximal right ureter is identified which may represent clot and/or tumor. Stomach/Bowel: Small hiatal hernia. The stomach is otherwise unremarkable. Normal appearance of the small bowel loops. Thickened appendix is identified within the right iliac fossa measuring 1 cm in diameter, image 65 of series 2. Previously this measured the same. No pathologic dilatation of the colon. Vascular/Lymphatic: Aortic atherosclerosis noted. Index right periaortic lymph node measures 1.6 cm, image 31 of series 2. Previously 1 cm. No pelvic or inguinal adenopathy. Infiltrating Reproductive: Prostate is unremarkable. Other: There is no ascites or focal fluid collections within the abdomen or pelvis. Musculoskeletal: No acute or significant osseous findings. IMPRESSION: 1. Since the previous exam there has been interval expansion of the left renal pelvis which likely represents a combination of progressive tumor and blood clot. There is a filling defect within the proximal left ureter which may represent tumor and/or blood clot. 2. Metastatic lymph node within the left periaortic region has increased in size in the interval. 3. Aortic atherosclerosis. Electronically Signed   By: Kerby Moors M.D.   On: 06/17/2016 14:57    Procedures Procedures (including critical  care time)  Medications Ordered in ED Medications  iopamidol (ISOVUE-300) 61 % injection (not administered)  iopamidol (ISOVUE-300) 61 % injection 100 mL (80 mLs Intravenous Contrast Given 06/17/16 1427)     Initial Impression / Assessment and Plan / ED Course  I have reviewed the triage vital signs and the  nursing notes.  Pertinent labs & imaging results that were available during my care of the patient were reviewed by me and considered in my medical decision making (see chart for details).  Clinical Course     Progressive tumor and blood clot noted in the left renal pelvis.  Patient with ongoing hematuria.  Urology consultation.  Patient is to be admitted the hospital for symptomatically anemia.  Will discuss case with hospitalist service as well regarding timing of transfusion.  Currently his hemoglobin is 8, and does have symptomatic anemia.  3:56 PM Recommendation by urology at this time is hospitalist admission. Urology will consult. May need nephrectomy in the next couple of days if bleeding continues. Pt and family updated. Urine sent for culture  Final Clinical Impressions(s) / ED Diagnoses   Final diagnoses:  Anemia, unspecified type  Hematuria, unspecified type  Renal cancer, left [facility])    New Prescriptions New Prescriptions   No medications on file     Jola Schmidt, MD 06/17/16 1556

## 2016-06-18 DIAGNOSIS — I4891 Unspecified atrial fibrillation: Secondary | ICD-10-CM | POA: Diagnosis not present

## 2016-06-18 DIAGNOSIS — R310 Gross hematuria: Secondary | ICD-10-CM

## 2016-06-18 DIAGNOSIS — I10 Essential (primary) hypertension: Secondary | ICD-10-CM

## 2016-06-18 DIAGNOSIS — E785 Hyperlipidemia, unspecified: Secondary | ICD-10-CM

## 2016-06-18 DIAGNOSIS — K219 Gastro-esophageal reflux disease without esophagitis: Secondary | ICD-10-CM

## 2016-06-18 DIAGNOSIS — D62 Acute posthemorrhagic anemia: Secondary | ICD-10-CM | POA: Diagnosis not present

## 2016-06-18 DIAGNOSIS — J449 Chronic obstructive pulmonary disease, unspecified: Secondary | ICD-10-CM | POA: Diagnosis not present

## 2016-06-18 LAB — BASIC METABOLIC PANEL
ANION GAP: 5 (ref 5–15)
BUN: 20 mg/dL (ref 6–20)
CALCIUM: 8.6 mg/dL — AB (ref 8.9–10.3)
CO2: 27 mmol/L (ref 22–32)
CREATININE: 1.31 mg/dL — AB (ref 0.61–1.24)
Chloride: 103 mmol/L (ref 101–111)
GFR calc Af Amer: >60 mL/min (ref >60)
GFR, EST NON AFRICAN AMERICAN: 53 mL/min — AB (ref >60)
GLUCOSE: 102 mg/dL — AB (ref 65–99)
Potassium: 4.1 mmol/L (ref 3.5–5.1)
Sodium: 135 mmol/L (ref 135–145)

## 2016-06-18 LAB — HEMOGLOBIN AND HEMATOCRIT, BLOOD
HCT: 29.3 % — ABNORMAL LOW (ref 39.0–52.0)
Hemoglobin: 10 g/dL — ABNORMAL LOW (ref 13.0–17.0)

## 2016-06-18 LAB — PROTIME-INR
INR: 1.04
Prothrombin Time: 13.7 seconds (ref 11.4–15.2)

## 2016-06-18 LAB — CBC
HEMATOCRIT: 25.5 % — AB (ref 39.0–52.0)
Hemoglobin: 8.9 g/dL — ABNORMAL LOW (ref 13.0–17.0)
MCH: 31.3 pg (ref 26.0–34.0)
MCHC: 34.9 g/dL (ref 30.0–36.0)
MCV: 89.8 fL (ref 78.0–100.0)
PLATELETS: 139 10*3/uL — AB (ref 150–400)
RBC: 2.84 MIL/uL — ABNORMAL LOW (ref 4.22–5.81)
RDW: 17.2 % — AB (ref 11.5–15.5)
WBC: 5.6 10*3/uL (ref 4.0–10.5)

## 2016-06-18 LAB — TYPE AND SCREEN
ABO/RH(D): B POS
Antibody Screen: NEGATIVE
Unit division: 0
Unit division: 0

## 2016-06-18 LAB — MRSA PCR SCREENING: MRSA by PCR: POSITIVE — AB

## 2016-06-18 MED ORDER — FAMOTIDINE 20 MG PO TABS
20.0000 mg | ORAL_TABLET | Freq: Every day | ORAL | Status: DC
  Administered 2016-06-18 – 2016-06-19 (×2): 20 mg via ORAL
  Filled 2016-06-18 (×2): qty 1

## 2016-06-18 MED ORDER — CHLORHEXIDINE GLUCONATE CLOTH 2 % EX PADS: 6.0000 | MEDICATED_PAD | Freq: Every day | CUTANEOUS | Status: DC

## 2016-06-18 MED ORDER — MUPIROCIN 2 % EX OINT
1.0000 "application " | TOPICAL_OINTMENT | Freq: Two times a day (BID) | CUTANEOUS | Status: DC
  Administered 2016-06-18 – 2016-06-19 (×3): 1 via NASAL
  Filled 2016-06-18: qty 22

## 2016-06-18 MED ORDER — METOPROLOL TARTRATE 25 MG PO TABS
12.5000 mg | ORAL_TABLET | Freq: Two times a day (BID) | ORAL | Status: DC
  Administered 2016-06-18: 25 mg via ORAL
  Administered 2016-06-19: 12.5 mg via ORAL
  Filled 2016-06-18 (×3): qty 1

## 2016-06-18 NOTE — Progress Notes (Signed)
Nursing Note: Second unit of blood in.No signs of reaction to transfusion.wbb

## 2016-06-18 NOTE — Progress Notes (Signed)
PROGRESS NOTE    [name] [name]  [id number] DOB: 09-19-1945 DOA: 06/17/2016 PCP: [name] Stain, MD    Brief Narrative:  Patient is a pleasant 70 yo with history of bladder cell cancer, atrial fibrillation, coronary artery disease, COPD, gastroesophageal reflux disease who presented to the ED with gross hematuria, and symptomatic anemia.   Assessment & Plan:   Principal Problem:   Acute blood loss anemia Active Problems:   Hematuria   Essential hypertension   ATRIAL FIBRILLATION   COPD (chronic obstructive pulmonary disease) (HCC)   GERD   Hyperlipidemia  #1 gross hematuria in the setting of metastatic transitional cell carcinoma of the bladder Patient had presented with gross hematuria and symptomatic anemia with hemoglobin of 8 from 12.2 on 05/28/2016. Patient status post 2 units packed red blood cells with symptomatic improvement. Hemoglobin at 8.9 this morning. Patient has been seen in consultation by urology who recommended trending hemoglobin. His hemoglobin stabilizes patient could be discharged home with outpatient follow-up. If hemoglobin continues to trend down and patient with increased hematuria or bleeding may need further evaluation by urology.  #2 acute blood loss anemia Secondary to problem #1. Status post 2 units packed red blood cells. Hemoglobin currently at 8.9. Follow H&H.  #3 atrial fibrillation Resume beta blocker for rate control. Due to hematuria not on anticoagulation at this time.  #4 hypertension Stable. Blood pressure borderline. Patient's ACE inhibitor on hold. Beta blocker has been resumed. Follow.  #5 COPD Stable.  #6 gastroesophageal reflux disease Continue Pepcid.  #7 hyperlipidemia Continue Lipitor.    DVT prophylaxis: SCDs Code Status: Full Family Communication: Updated patient and wife at bedside. Disposition Plan: Home when medically stable and hematuria has improved and per urology.   Consultants:   Urology: Dr.  Noah Delaine 06/18/2016  Procedures:   CT abd/pelvis 06/17/2016  2 units packed red blood cells 06/17/2016  Antimicrobials:   None   Subjective: Patient states hematuria improving with less clots. No chest pain. No shortness of breath. Patient denies any dizziness. No lightheadedness.  Objective: Vitals:   06/18/16 0026 06/18/16 0651 06/18/16 1437 06/18/16 1800  BP: 118/76 132/71 (!) 102/57   Pulse: 69 77  67  Resp: 20 20 18    Temp: 97.5 F (36.4 C) 97.2 F (36.2 C)  97.9 F (36.6 C)  TempSrc: Oral Oral    SpO2: 99% 100% 100%   Weight:      Height:        Intake/Output Summary (Last 24 hours) at 06/18/16 1934 Last data filed at 06/18/16 1758  Gross per 24 hour  Intake           1438.5 ml  Output              570 ml  Net            868.5 ml   Filed Weights   06/17/16 1135 06/17/16 1727  Weight: 81.6 kg (180 lb) 83.2 kg (183 lb 7 oz)    Examination:  General exam: Appears calm and comfortable  Respiratory system: Clear to auscultation. Respiratory effort normal. Cardiovascular system: S1 & S2 heard, RRR. No JVD, murmurs, rubs, gallops or clicks. No pedal edema. Gastrointestinal system: Abdomen is nondistended, soft and nontender. No organomegaly or masses felt. Normal bowel sounds heard. Central nervous system: Alert and oriented. No focal neurological deficits. Extremities: Symmetric 5 x 5 power. Skin: No rashes, lesions or ulcers Psychiatry: Judgement and insight appear normal. Mood & affect appropriate.  Data Reviewed: I have personally reviewed following labs and imaging studies  CBC:  Recent Labs Lab 06/17/16 1303 06/18/16 0540 06/18/16 1837  WBC 8.1 5.6  --   NEUTROABS 5.5  --   --   HGB 8.0* 8.9* 10.0*  HCT 24.0* 25.5* 29.3*  MCV 98.8 89.8  --   PLT 164 139*  --    Basic Metabolic Panel:  Recent Labs Lab 06/17/16 1303 06/18/16 0540  NA 136 135  K 4.0 4.1  CL 103 103  CO2 25 27  GLUCOSE 101* 102*  BUN 23* 20  CREATININE 1.53*  1.31*  CALCIUM 8.8* 8.6*   GFR: Estimated Creatinine Clearance: 52.6 mL/min (by C-G formula based on SCr of 1.31 mg/dL (H)). Liver Function Tests: No results for input(s): AST, ALT, ALKPHOS, BILITOT, PROT, ALBUMIN in the last 168 hours. No results for input(s): LIPASE, AMYLASE in the last 168 hours. No results for input(s): AMMONIA in the last 168 hours. Coagulation Profile:  Recent Labs Lab 06/18/16 0540  INR 1.04   Cardiac Enzymes: No results for input(s): CKTOTAL, CKMB, CKMBINDEX, TROPONINI in the last 168 hours. BNP (last 3 results) No results for input(s): PROBNP in the last 8760 hours. HbA1C: No results for input(s): HGBA1C in the last 72 hours. CBG: No results for input(s): GLUCAP in the last 168 hours. Lipid Profile: No results for input(s): CHOL, HDL, LDLCALC, TRIG, CHOLHDL, LDLDIRECT in the last 72 hours. Thyroid Function Tests: No results for input(s): TSH, T4TOTAL, FREET4, T3FREE, THYROIDAB in the last 72 hours. Anemia Panel: No results for input(s): VITAMINB12, FOLATE, FERRITIN, TIBC, IRON, RETICCTPCT in the last 72 hours. Sepsis Labs: No results for input(s): PROCALCITON, LATICACIDVEN in the last 168 hours.  Recent Results (from the past 240 hour(s))  MRSA PCR Screening     Status: Abnormal   Collection Time: 06/17/16  5:17 PM  Result Value Ref Range Status   MRSA by PCR POSITIVE (A) NEGATIVE Final    Comment:        The GeneXpert MRSA Assay (FDA approved for NASAL specimens only), is one component of a comprehensive MRSA colonization surveillance program. It is not intended to diagnose MRSA infection nor to guide or monitor treatment for MRSA infections. RESULT CALLED TO, READ BACK BY AND VERIFIED WITHArnold Long RN [phone] 06/18/16 A [facility]          Radiology Studies: Ct Abdomen Pelvis W Contrast  Result Date: 06/17/2016 CLINICAL DATA:  Hematuria.  Urothelial carcinoma EXAM: CT ABDOMEN AND PELVIS WITH CONTRAST TECHNIQUE: Multidetector CT  imaging of the abdomen and pelvis was performed using the standard protocol following bolus administration of intravenous contrast. CONTRAST:  69mL ISOVUE-300 IOPAMIDOL (ISOVUE-300) INJECTION 61% COMPARISON:  04/14/2016 FINDINGS: Lower chest: Scar like densities noted within the left lower lobe. No pleural fluid. Hepatobiliary: Right kidney cyst measures 1.7 cm. Gallbladder appears normal. No biliary dilatation. Pancreas: Unremarkable. No pancreatic ductal dilatation or surrounding inflammatory changes. Spleen: Normal appearance of the spleen. Adrenals/Urinary Tract: Right kidney is normal. No mass or hydronephrosis. Infiltrating mass involving the upper pole of the left kidney is again identified. There is been interval expansion of the right renal pelvis which now measures 1.4 cm, image 31 of series 2. Previously 5 mm. Filling defect within the proximal right ureter is identified which may represent clot and/or tumor. Stomach/Bowel: Small hiatal hernia. The stomach is otherwise unremarkable. Normal appearance of the small bowel loops. Thickened appendix is identified within the right iliac fossa measuring 1 cm in  diameter, image 65 of series 2. Previously this measured the same. No pathologic dilatation of the colon. Vascular/Lymphatic: Aortic atherosclerosis noted. Index right periaortic lymph node measures 1.6 cm, image 31 of series 2. Previously 1 cm. No pelvic or inguinal adenopathy. Infiltrating Reproductive: Prostate is unremarkable. Other: There is no ascites or focal fluid collections within the abdomen or pelvis. Musculoskeletal: No acute or significant osseous findings. IMPRESSION: 1. Since the previous exam there has been interval expansion of the left renal pelvis which likely represents a combination of progressive tumor and blood clot. There is a filling defect within the proximal left ureter which may represent tumor and/or blood clot. 2. Metastatic lymph node within the left periaortic region has  increased in size in the interval. 3. Aortic atherosclerosis. Electronically Signed   By: Kerby Moors M.D.   On: 06/17/2016 14:57        Scheduled Meds: . sodium chloride   Intravenous Once  . atorvastatin  40 mg Oral Daily  . Chlorhexidine Gluconate Cloth  6 each Topical Q0600  . famotidine  20 mg Oral Daily  . metoprolol tartrate  12.5 mg Oral BID  . mupirocin ointment  1 application Nasal BID   Continuous Infusions:   LOS: 0 days    Time spent: 93 mins    Nicolaas Savo, MD [facility] Pager [phone]  If 7PM-7AM, please contact night-coverage [email or web address] Password [facility] 06/18/2016, 7:34 PM

## 2016-06-18 NOTE — Consult Note (Signed)
Urology Consult  Referring physician: Dr. Grandville Silos Reason for referral: hematuria, metastatic TCC  Chief Complaint: gross hematuria  History of Present Illness: [name] is a 70yo with a hx of metastatic TCC admitted yesterday with symptomatic anemia and gross hematuria. He is currently undergoing Pembrolizumab therapy for his metastatic TCC. Hemoglobin was 12 1 week ago and then yesterday was 8. He was having dizziness/lightheadedness. After 2 units PRBCs his hemoglobin is 8.9. For 9 days he has been having gross hematuria with numerous clots. This morning the hematuria has improved and he has not seen any clots. His urine currently is dark red. He was being treated for a UTI but antibiotic therapy failed to improve the hematuria. He denies any flank pain. No other LUTS. He has chronic fatigue. Dizziness has improved  Past Medical History:  Diagnosis Date  . Atrial fibrillation (HCC)    Breif, post-op CABG  . bladder ca dx'd 03/2015  . CAD (coronary artery disease)    Exertional chest pain prompted LHC 10/11 showing EF 55%, mild inferior hypokinesis, 90% prox RCA, 70% mid RCA, 80% distal RCA, 70% ostial PDA, 70% mPLV, 90% mid OM1 (large), 90-95% prox LAD. Pt had CABG with LIMA-LAD, SVG-OMG1, seq SVG-PDA/PLV  . COPD (chronic obstructive pulmonary disease) ([place])    Quit smoking 10/11  . ED (erectile dysfunction) of organic origin   . GERD (gastroesophageal reflux disease)   . Hypertension    Past Surgical History:  Procedure Laterality Date  . CORONARY ARTERY BYPASS GRAFT     LIMA-LAD, SVG-OM1, seq SVG-PDA/PLV  . CYSTOSCOPY WITH RETROGRADE PYELOGRAM, URETEROSCOPY AND STENT PLACEMENT Left 04/08/2015   Procedure: CYSTOSCOPY WITH LEFT  RETROGRADE PYELOGRAM, BALLOON DILATION LEFT URETER, LEFT URETEROSCOPY,LEFT STENT PLACEMENT;  Surgeon: Nickie Retort, MD;  Location: [facility];  Service: Urology;  Laterality: Left;    Medications: I have reviewed the patient's current  medications. Allergies:  Allergies  Allergen Reactions  . Hydrocodone Other (See Comments)    Became too sedated    Family History  Problem Relation Age of Onset  . Arthritis Mother   . Hypertension Mother   . Sudden death Mother 74  . Arthritis Father   . Hypertension Father   . Lung cancer Father   . Stroke Maternal Uncle   . Heart attack Neg Hx    Social History:  reports that he quit smoking about 6 years ago. He has quit using smokeless tobacco. He reports that he does not drink alcohol or use drugs.  Review of Systems  Genitourinary: Positive for hematuria.  Neurological: Positive for weakness.  All other systems reviewed and are negative.   Physical Exam:  Vital signs in last 24 hours: Temp:  [97 F (36.1 C)-97.7 F (36.5 C)] 97.2 F (36.2 C) (12/22 0651) Pulse Rate:  [64-83] 77 (12/22 0651) Resp:  [12-20] 20 (12/22 0651) BP: (104-132)/(57-85) 132/71 (12/22 0651) SpO2:  [98 %-100 %] 100 % (12/22 0651) Weight:  [83.2 kg (183 lb 7 oz)] 83.2 kg (183 lb 7 oz) (12/21 1727) Physical Exam  Constitutional: He is oriented to person, place, and time. He appears well-developed and well-nourished.  HENT:  Head: Normocephalic and atraumatic.  Eyes: EOM are normal. Pupils are equal, round, and reactive to light.  Neck: Normal range of motion. No thyromegaly present.  Cardiovascular: Normal rate and regular rhythm.   Respiratory: Effort normal. No respiratory distress.  GI: Soft. He exhibits no distension.  Musculoskeletal: Normal range of motion. He exhibits no edema.  Neurological:  He is alert and oriented to person, place, and time.  Skin: Skin is warm and dry.  Psychiatric: He has a normal mood and affect. His behavior is normal. Judgment and thought content normal.    Laboratory Data:  Results for orders placed or performed during the hospital encounter of 06/17/16 (from the past 72 hour(s))  Urinalysis, Routine w reflex microscopic     Status: Abnormal    Collection Time: 06/17/16 12:50 PM  Result Value Ref Range   Color, Urine RED (A) YELLOW   APPearance TURBID (A) CLEAR   Specific Gravity, Urine 1.015 1.005 - 1.030   pH 6.0 5.0 - 8.0   Glucose, UA 50 (A) NEGATIVE mg/dL   Hgb urine dipstick LARGE (A) NEGATIVE   Bilirubin Urine NEGATIVE NEGATIVE   Ketones, ur 20 (A) NEGATIVE mg/dL   Protein, ur 100 (A) NEGATIVE mg/dL   Nitrite NEGATIVE NEGATIVE   Leukocytes, UA NEGATIVE NEGATIVE   RBC / HPF TOO NUMEROUS TO COUNT 0 - 5 RBC/hpf   WBC, UA NONE SEEN 0 - 5 WBC/hpf   Bacteria, UA NONE SEEN NONE SEEN   Squamous Epithelial / LPF NONE SEEN NONE SEEN   Mucous PRESENT   Type and screen [place]     Status: None   Collection Time: 06/17/16 12:58 PM  Result Value Ref Range   ABO/RH(D) B POS    Antibody Screen NEG    Sample Expiration 06/20/2016    Unit Number [id number]    Blood Component Type RED CELLS,LR    Unit division 00    Status of Unit ISSUED,FINAL    Transfusion Status OK TO TRANSFUSE    Crossmatch Result Compatible    Unit Number [id number]    Blood Component Type RED CELLS,LR    Unit division 00    Status of Unit ISSUED,FINAL    Transfusion Status OK TO TRANSFUSE    Crossmatch Result Compatible   CBC with Differential/Platelet     Status: Abnormal   Collection Time: 06/17/16  1:03 PM  Result Value Ref Range   WBC 8.1 4.0 - 10.5 K/uL   RBC 2.43 (L) 4.22 - 5.81 MIL/uL   Hemoglobin 8.0 (L) 13.0 - 17.0 g/dL   HCT 24.0 (L) 39.0 - 52.0 %   MCV 98.8 78.0 - 100.0 fL   MCH 32.9 26.0 - 34.0 pg   MCHC 33.3 30.0 - 36.0 g/dL   RDW 14.2 11.5 - 15.5 %   Platelets 164 150 - 400 K/uL   Neutrophils Relative % 68 %   Neutro Abs 5.5 1.7 - 7.7 K/uL   Lymphocytes Relative 23 %   Lymphs Abs 1.8 0.7 - 4.0 K/uL   Monocytes Relative 7 %   Monocytes Absolute 0.6 0.1 - 1.0 K/uL   Eosinophils Relative 2 %   Eosinophils Absolute 0.1 0.0 - 0.7 K/uL   Basophils Relative 0 %   Basophils Absolute 0.0 0.0 - 0.1 K/uL   Basic metabolic panel     Status: Abnormal   Collection Time: 06/17/16  1:03 PM  Result Value Ref Range   Sodium 136 135 - 145 mmol/L   Potassium 4.0 3.5 - 5.1 mmol/L   Chloride 103 101 - 111 mmol/L   CO2 25 22 - 32 mmol/L   Glucose, Bld 101 (H) 65 - 99 mg/dL   BUN 23 (H) 6 - 20 mg/dL   Creatinine, Ser 1.53 (H) 0.61 - 1.24 mg/dL   Calcium 8.8 (L) 8.9 - 10.3  mg/dL   GFR calc non Af Amer 44 (L) >60 mL/min   GFR calc Af Amer 51 (L) >60 mL/min    Comment: (NOTE) The eGFR has been calculated using the CKD EPI equation. This calculation has not been validated in all clinical situations. eGFR's persistently <60 mL/min signify possible Chronic Kidney Disease.    Anion gap 8 5 - 15  ABO/Rh     Status: None   Collection Time: 06/17/16  1:03 PM  Result Value Ref Range   ABO/RH(D) B POS   Prepare RBC     Status: None   Collection Time: 06/17/16  5:00 PM  Result Value Ref Range   Order Confirmation ORDER PROCESSED BY BLOOD BANK   MRSA PCR Screening     Status: Abnormal   Collection Time: 06/17/16  5:17 PM  Result Value Ref Range   MRSA by PCR POSITIVE (A) NEGATIVE    Comment:        The GeneXpert MRSA Assay (FDA approved for NASAL specimens only), is one component of a comprehensive MRSA colonization surveillance program. It is not intended to diagnose MRSA infection nor to guide or monitor treatment for MRSA infections. RESULT CALLED TO, READ BACK BY AND VERIFIED WITHArnold Long RN [phone] 06/18/16 A [facility]   Basic metabolic panel     Status: Abnormal   Collection Time: 06/18/16  5:40 AM  Result Value Ref Range   Sodium 135 135 - 145 mmol/L   Potassium 4.1 3.5 - 5.1 mmol/L   Chloride 103 101 - 111 mmol/L   CO2 27 22 - 32 mmol/L   Glucose, Bld 102 (H) 65 - 99 mg/dL   BUN 20 6 - 20 mg/dL   Creatinine, Ser 1.31 (H) 0.61 - 1.24 mg/dL   Calcium 8.6 (L) 8.9 - 10.3 mg/dL   GFR calc non Af Amer 53 (L) >60 mL/min   GFR calc Af Amer >60 >60 mL/min    Comment: (NOTE) The eGFR has  been calculated using the CKD EPI equation. This calculation has not been validated in all clinical situations. eGFR's persistently <60 mL/min signify possible Chronic Kidney Disease.    Anion gap 5 5 - 15  Protime-INR     Status: None   Collection Time: 06/18/16  5:40 AM  Result Value Ref Range   Prothrombin Time 13.7 11.4 - 15.2 seconds   INR 1.04   CBC     Status: Abnormal   Collection Time: 06/18/16  5:40 AM  Result Value Ref Range   WBC 5.6 4.0 - 10.5 K/uL   RBC 2.84 (L) 4.22 - 5.81 MIL/uL   Hemoglobin 8.9 (L) 13.0 - 17.0 g/dL   HCT 25.5 (L) 39.0 - 52.0 %   MCV 89.8 78.0 - 100.0 fL    Comment: REPEATED TO VERIFY DELTA CHECK NOTED    MCH 31.3 26.0 - 34.0 pg   MCHC 34.9 30.0 - 36.0 g/dL   RDW 17.2 (H) 11.5 - 15.5 %   Platelets 139 (L) 150 - 400 K/uL   Recent Results (from the past 240 hour(s))  MRSA PCR Screening     Status: Abnormal   Collection Time: 06/17/16  5:17 PM  Result Value Ref Range Status   MRSA by PCR POSITIVE (A) NEGATIVE Final    Comment:        The GeneXpert MRSA Assay (FDA approved for NASAL specimens only), is one component of a comprehensive MRSA colonization surveillance program. It is not intended to diagnose MRSA  infection nor to guide or monitor treatment for MRSA infections. RESULT CALLED TO, READ BACK BY AND VERIFIED WITHArnold Long RN [phone] 06/18/16 A [facility]    Creatinine:  Recent Labs  06/17/16 1303 06/18/16 0540  CREATININE 1.53* 1.31*   Baseline Creatinine: 1.3  Impression/Assessment:  70yo with metastatic TCC with gross hematuria  Plan:  1. Please continue to trend hemoglobin. I counselled the patient that intermittent hematuria is not unexpected given the aggressive nature of his malignancy. If his hemoglobin remains stable the patient can be discharged home and followup with Dr. [name] on 06/22/2016 which is already scheduled. If his hemoglobin continue to decline we discussed possible embolization of his kidney followed by  nephrectomy.  Nicolette Bang 06/18/2016, 12:45 PM

## 2016-06-18 NOTE — Progress Notes (Signed)
Nursing Note:Lab called with result for positive MRSA from screen sent earlier.Talked with Amy and verified per [email or web address]

## 2016-06-19 DIAGNOSIS — R319 Hematuria, unspecified: Secondary | ICD-10-CM | POA: Diagnosis not present

## 2016-06-19 DIAGNOSIS — J449 Chronic obstructive pulmonary disease, unspecified: Secondary | ICD-10-CM | POA: Diagnosis not present

## 2016-06-19 DIAGNOSIS — I4891 Unspecified atrial fibrillation: Secondary | ICD-10-CM | POA: Diagnosis not present

## 2016-06-19 DIAGNOSIS — C689 Malignant neoplasm of urinary organ, unspecified: Secondary | ICD-10-CM

## 2016-06-19 DIAGNOSIS — R310 Gross hematuria: Secondary | ICD-10-CM | POA: Diagnosis not present

## 2016-06-19 DIAGNOSIS — C652 Malignant neoplasm of left renal pelvis: Secondary | ICD-10-CM

## 2016-06-19 DIAGNOSIS — K219 Gastro-esophageal reflux disease without esophagitis: Secondary | ICD-10-CM | POA: Diagnosis not present

## 2016-06-19 DIAGNOSIS — D62 Acute posthemorrhagic anemia: Secondary | ICD-10-CM | POA: Diagnosis not present

## 2016-06-19 LAB — URINE CULTURE: Culture: NO GROWTH

## 2016-06-19 LAB — BASIC METABOLIC PANEL
ANION GAP: 6 (ref 5–15)
BUN: 20 mg/dL (ref 6–20)
CALCIUM: 8.8 mg/dL — AB (ref 8.9–10.3)
CHLORIDE: 104 mmol/L (ref 101–111)
CO2: 28 mmol/L (ref 22–32)
Creatinine, Ser: 1.4 mg/dL — ABNORMAL HIGH (ref 0.61–1.24)
GFR calc Af Amer: 56 mL/min — ABNORMAL LOW (ref >60)
GFR calc non Af Amer: 49 mL/min — ABNORMAL LOW (ref >60)
GLUCOSE: 98 mg/dL (ref 65–99)
Potassium: 3.9 mmol/L (ref 3.5–5.1)
Sodium: 138 mmol/L (ref 135–145)

## 2016-06-19 LAB — HEMOGLOBIN AND HEMATOCRIT, BLOOD
HCT: 27.3 % — ABNORMAL LOW (ref 39.0–52.0)
HEMOGLOBIN: 9.3 g/dL — AB (ref 13.0–17.0)

## 2016-06-19 MED ORDER — FAMOTIDINE 20 MG PO TABS: 20.0000 mg | ORAL_TABLET | Freq: Every day | ORAL | 2 refills | Status: AC

## 2016-06-19 NOTE — Progress Notes (Signed)
Patient discharged to home, all discharge medications and instructions reviewed and questions answered.  Patient declined wheelchair assistance to vehicle, will ambulate. 

## 2016-06-19 NOTE — Discharge Summary (Signed)
Physician Discharge Summary  [name] [name] [id number] DOB: 1945/09/19 DOA: 06/17/2016  PCP: [name], MD  Admit date: 06/17/2016 Discharge date: 06/19/2016  Time spent: 65 minutes  Recommendations for Outpatient Follow-up:  1. Follow-up with Dr. [name], urology as scheduled on 06/22/2016. On follow-up patient in need an H&H done to follow-up on his hemoglobin. 2. Follow-up with Dr. [name], as a registered scheduled on 06/25/2016 for continuation of his current regimen of salvage therapy.   Discharge Diagnoses:  Principal Problem:   Gross hematuria Active Problems:   Acute blood loss anemia   Essential hypertension   ATRIAL FIBRILLATION   COPD (chronic obstructive pulmonary disease) (HCC)   GERD   Hyperlipidemia   Urothelial cancer ([place])   Discharge Condition: Stable and improved  Diet recommendation: Heart healthy  Filed Weights   06/17/16 1135 06/17/16 1727  Weight: 81.6 kg (180 lb) 83.2 kg (183 lb 7 oz)    History of present illness:  Per Dr Everlena [name] is a 70 y.o. male  with past medical history significant for bladder cancer, coronary artery disease, COPD, reflux, hypertension and atrial fibrillation presented to the emergency room complaining hematuria. Patient was being seen by his urology office for hematuria. Getting treatment for suspected urinary tract infection with gentamicin intramuscular injections. Lab work showed the patient was anemic with a hemoglobin of 8.2. Patient was directed to the emergency room for further evaluation. Patient states that over the last few days he's developed worsening weakness and dizziness.  Patient denied fever, chills, nausea, vomiting, diarrhea, headache, shortness of breath and chest pain.   Hospital Course:  #1 gross hematuria in the setting of metastatic transitional cell carcinoma of the bladder Patient had presented with gross hematuria and symptomatic anemia with hemoglobin of 8 from 12.2 on  05/28/2016. Patient status post 2 units packed red blood cells with symptomatic improvement. Hemoglobin Improved and stabilized at 9.3 by [place] discharge of 06/19/2016. Patient has been seen in consultation by urology who recommended trending hemoglobin. Patient's hemoglobin stabilized at 9.3 with improvement with his hematuria. Patient was also seen in consultation by oncology Dr. [name] left felt patient's hematuria was also likely secondary to his transitional cell carcinoma. It was recommended that patient will follow-up with his oncologist as scheduled on 06/25/2016. Patient is also to follow-up with urology, Dr. [name] as scheduled on 06/22/2016 for further evaluation and management. Patient will be discharged home in stable and improved condition.  #2 acute blood loss anemia Secondary to problem #1. Status post 2 units packed red blood cells. Hemoglobin stabilized at 9.3. Outpatient follow-up.   #3 history of paroxysmal atrial fibrillation Patient was maintained on his beta blocker for rate control.  Due to hematuria not on anticoagulation at this time. Patient states on the had one episode of atrial fibrillation post-CABG and none since then.  #4 hypertension Stable. Blood pressure borderline. Patient's ACE inhibitor was discontinued during this hospitalization and beta blocker was continued. Outpatient follow-up.  #5 COPD Stable.  #6 gastroesophageal reflux disease Continued on Pepcid.  #7 hyperlipidemia Continued on home regimen of Lipitor.   Procedures:  CT abd/pelvis 06/17/2016  2 units packed red blood cells 06/17/2016  Consultations:  Urology: Dr. Noah Delaine 06/18/2016  Discharge Exam: Vitals:   06/18/16 2225 06/19/16 0500  BP: 122/72 111/72  Pulse: 72 71  Resp: 16 16  Temp: 97.3 F (36.3 C) 98.1 F (36.7 C)    General: NAD Cardiovascular: RRR Respiratory: CTAB  Discharge Instructions   Discharge Instructions  Diet - low sodium heart healthy     Complete by:  As directed    Discharge instructions    Complete by:  As directed    Follow up with dr [name] as scheduled. Follow up with Dr [name] as scheduled.   Increase activity slowly    Complete by:  As directed      Current Discharge Medication List    START taking these medications   Details  famotidine (PEPCID) 20 MG tablet Take 1 tablet (20 mg total) by mouth daily. Qty: 30 tablet, Refills: 2      CONTINUE these medications which have NOT CHANGED   Details  acetaminophen (TYLENOL) 325 MG tablet Take 650 mg by mouth every 6 (six) hours as needed for moderate pain or headache.     atorvastatin (LIPITOR) 40 MG tablet Take 1 tablet (40 mg total) by mouth daily. Qty: 90 tablet, Refills: 3   Associated Diagnoses: Essential hypertension; Atrial fibrillation, unspecified type (HCC)    Cholecalciferol (VITAMIN D) 1000 UNITS capsule Take 1,000 Units by mouth daily.      docusate sodium (COLACE) 100 MG capsule Take 100 mg by mouth 2 (two) times daily as needed (constipation).    metoprolol tartrate (LOPRESSOR) 25 MG tablet Take 0.5 tablets (12.5 mg total) by mouth 2 (two) times daily. Qty: 90 tablet, Refills: 3   Associated Diagnoses: Essential hypertension; Atrial fibrillation, unspecified type (HCC)    polycarbophil (FIBERCON) 625 MG tablet Take 625 mg by mouth 2 (two) times daily.     sildenafil (VIAGRA) 50 MG tablet Take 50 mg by mouth as needed for erectile dysfunction.     benzonatate (TESSALON) 200 MG capsule Take 1 capsule (200 mg total) by mouth 3 (three) times daily as needed for cough. Qty: 20 capsule, Refills: 0    lidocaine-prilocaine (EMLA) cream Apply to port-a-cath 1-2 hours prior to acces. Cover with saran wrap. Qty: 30 g, Refills: 1   Associated Diagnoses: Urothelial cancer ([place]); Renal mass, left    nitroGLYCERIN (NITROSTAT) 0.4 MG SL tablet Place 1 tablet (0.4 mg total) under the tongue every 5 (five) minutes as needed. May repeat up to 3 doses. Qty:  100 tablet, Refills: 3    PRESCRIPTION MEDICATION Inject 160 mg into the muscle daily. Gentamycin given for 3 days at [facility]      STOP taking these medications     aspirin EC 81 MG tablet      lisinopril (PRINIVIL,ZESTRIL) 5 MG tablet      pseudoephedrine-guaifenesin (MUCINEX D) 60-600 MG 12 hr tablet        Allergies  Allergen Reactions  . Hydrocodone Other (See Comments)    Became too sedated   Follow-up Information    Nickie Retort, MD Follow up on 06/22/2016.   Specialty:  Urology Why:  F/U AS SCHEDULED. Contact information: [place] [address] [phone]        [facility], MD Follow up on 06/25/2016.   Specialty:  Oncology Why:  f/u as scheduled. Contact information: [address] [place] [address] [phone]            The results of significant diagnostics from this hospitalization (including imaging, microbiology, ancillary and laboratory) are listed below for reference.    Significant Diagnostic Studies: Ct Abdomen Pelvis W Contrast  Result Date: 06/17/2016 CLINICAL DATA:  Hematuria.  Urothelial carcinoma EXAM: CT ABDOMEN AND PELVIS WITH CONTRAST TECHNIQUE: Multidetector CT imaging of the abdomen and pelvis was performed  using the standard protocol following bolus administration of intravenous contrast. CONTRAST:  16mL ISOVUE-300 IOPAMIDOL (ISOVUE-300) INJECTION 61% COMPARISON:  04/14/2016 FINDINGS: Lower chest: Scar like densities noted within the left lower lobe. No pleural fluid. Hepatobiliary: Right kidney cyst measures 1.7 cm. Gallbladder appears normal. No biliary dilatation. Pancreas: Unremarkable. No pancreatic ductal dilatation or surrounding inflammatory changes. Spleen: Normal appearance of the spleen. Adrenals/Urinary Tract: Right kidney is normal. No mass or hydronephrosis. Infiltrating mass involving the upper pole of the left kidney is again identified. There is been interval expansion of the right  renal pelvis which now measures 1.4 cm, image 31 of series 2. Previously 5 mm. Filling defect within the proximal right ureter is identified which may represent clot and/or tumor. Stomach/Bowel: Small hiatal hernia. The stomach is otherwise unremarkable. Normal appearance of the small bowel loops. Thickened appendix is identified within the right iliac fossa measuring 1 cm in diameter, image 65 of series 2. Previously this measured the same. No pathologic dilatation of the colon. Vascular/Lymphatic: Aortic atherosclerosis noted. Index right periaortic lymph node measures 1.6 cm, image 31 of series 2. Previously 1 cm. No pelvic or inguinal adenopathy. Infiltrating Reproductive: Prostate is unremarkable. Other: There is no ascites or focal fluid collections within the abdomen or pelvis. Musculoskeletal: No acute or significant osseous findings. IMPRESSION: 1. Since the previous exam there has been interval expansion of the left renal pelvis which likely represents a combination of progressive tumor and blood clot. There is a filling defect within the proximal left ureter which may represent tumor and/or blood clot. 2. Metastatic lymph node within the left periaortic region has increased in size in the interval. 3. Aortic atherosclerosis. Electronically Signed   By: Kerby Moors M.D.   On: 06/17/2016 14:57    Microbiology: Recent Results (from the past 240 hour(s))  Urine culture     Status: None   Collection Time: 06/17/16 12:50 PM  Result Value Ref Range Status   Specimen Description URINE, RANDOM  Final   Special Requests NONE  Final   Culture NO GROWTH Performed at [facility]   Final   Report Status 06/19/2016 FINAL  Final  MRSA PCR Screening     Status: Abnormal   Collection Time: 06/17/16  5:17 PM  Result Value Ref Range Status   MRSA by PCR POSITIVE (A) NEGATIVE Final    Comment:        The GeneXpert MRSA Assay (FDA approved for NASAL specimens only), is one component of  a comprehensive MRSA colonization surveillance program. It is not intended to diagnose MRSA infection nor to guide or monitor treatment for MRSA infections. RESULT CALLED TO, READ BACK BY AND VERIFIED WITHArnold Long RN [id number] 06/18/16 A NAVARRO      Labs: Basic Metabolic Panel:  Recent Labs Lab 06/17/16 1303 06/18/16 0540 06/19/16 0537  NA 136 135 138  K 4.0 4.1 3.9  CL 103 103 104  CO2 25 27 28   GLUCOSE 101* 102* 98  BUN 23* 20 20  CREATININE 1.53* 1.31* 1.40*  CALCIUM 8.8* 8.6* 8.8*   Liver Function Tests: No results for input(s): AST, ALT, ALKPHOS, BILITOT, PROT, ALBUMIN in the last 168 hours. No results for input(s): LIPASE, AMYLASE in the last 168 hours. No results for input(s): AMMONIA in the last 168 hours. CBC:  Recent Labs Lab 06/17/16 1303 06/18/16 0540 06/18/16 1837 06/19/16 0537  WBC 8.1 5.6  --   --   NEUTROABS 5.5  --   --   --  HGB 8.0* 8.9* 10.0* 9.3*  HCT 24.0* 25.5* 29.3* 27.3*  MCV 98.8 89.8  --   --   PLT 164 139*  --   --    Cardiac Enzymes: No results for input(s): CKTOTAL, CKMB, CKMBINDEX, TROPONINI in the last 168 hours. BNP: BNP (last 3 results) No results for input(s): BNP in the last 8760 hours.  ProBNP (last 3 results) No results for input(s): PROBNP in the last 8760 hours.  CBG: No results for input(s): GLUCAP in the last 168 hours.     SignedIrine Seal MD.  [facility] 06/19/2016, 12:22 PM

## 2016-06-19 NOTE — Progress Notes (Signed)
IP PROGRESS NOTE  Subjective:   [name] [name] is known to me with transitional cell carcinoma of the left renal pelvis. He initially presented with left kidney mass and metastatic disease. He has been receiving chemotherapy intermittently since November 2016.  He is currently receiving Pembrolizumab every 3 weeks. His last treatment was given on 05/28/2016. He was hospitalized for hematuria and worsening anemia. He noted spontaneous brisk hematuria with clots noted in the last few days. He was seen at [facility] urology and was treated for urinary tract infection initially but his hematuria persisted and required hospitalization as well as packed red cell transfusion after her hemoglobin drifting to 8.0.  Clinically, he is improving and asymptomatic at this time. His hematuria have improved with very little discoloration in his urine at this time. His hemoglobin after transfusion went up to 10.0 and currently at 9.3.  Objective:  Vital signs in last 24 hours: Temp:  [97.3 F (36.3 C)-98.1 F (36.7 C)] 98.1 F (36.7 C) (12/23 0500) Pulse Rate:  [67-72] 71 (12/23 0500) Resp:  [16-18] 16 (12/23 0500) BP: (102-122)/(57-72) 111/72 (12/23 0500) SpO2:  [97 %-100 %] 97 % (12/23 0500) Weight change:  Last BM Date: 06/17/16  Intake/Output from previous day: 12/22 0701 - 12/23 0700 In: 720 [P.O.:720] Out: 570 [Urine:570] Alert, awake gentleman appeared without distress. Mouth: mucous membranes moist, pharynx normal without lesions Resp: clear to auscultation bilaterally Cardio: regular rate and rhythm, S1, S2 normal, no murmur, click, rub or gallop GI: soft, non-tender; bowel sounds normal; no masses,  no organomegaly Extremities: extremities normal, atraumatic, no cyanosis or edema  Portacath/PICC-without erythema  Lab Results:  Recent Labs  06/17/16 1303 06/18/16 0540 06/18/16 1837 06/19/16 0537  WBC 8.1 5.6  --   --   HGB 8.0* 8.9* 10.0* 9.3*  HCT 24.0* 25.5* 29.3* 27.3*  PLT 164  139*  --   --     BMET  Recent Labs  06/18/16 0540 06/19/16 0537  NA 135 138  K 4.1 3.9  CL 103 104  CO2 27 28  GLUCOSE 102* 98  BUN 20 20  CREATININE 1.31* 1.40*  CALCIUM 8.6* 8.8*    Studies/Results: Ct Abdomen Pelvis W Contrast  Result Date: 06/17/2016 CLINICAL DATA:  Hematuria.  Urothelial carcinoma EXAM: CT ABDOMEN AND PELVIS WITH CONTRAST TECHNIQUE: Multidetector CT imaging of the abdomen and pelvis was performed using the standard protocol following bolus administration of intravenous contrast. CONTRAST:  46mL ISOVUE-300 IOPAMIDOL (ISOVUE-300) INJECTION 61% COMPARISON:  04/14/2016 FINDINGS: Lower chest: Scar like densities noted within the left lower lobe. No pleural fluid. Hepatobiliary: Right kidney cyst measures 1.7 cm. Gallbladder appears normal. No biliary dilatation. Pancreas: Unremarkable. No pancreatic ductal dilatation or surrounding inflammatory changes. Spleen: Normal appearance of the spleen. Adrenals/Urinary Tract: Right kidney is normal. No mass or hydronephrosis. Infiltrating mass involving the upper pole of the left kidney is again identified. There is been interval expansion of the right renal pelvis which now measures 1.4 cm, image 31 of series 2. Previously 5 mm. Filling defect within the proximal right ureter is identified which may represent clot and/or tumor. Stomach/Bowel: Small hiatal hernia. The stomach is otherwise unremarkable. Normal appearance of the small bowel loops. Thickened appendix is identified within the right iliac fossa measuring 1 cm in diameter, image 65 of series 2. Previously this measured the same. No pathologic dilatation of the colon. Vascular/Lymphatic: Aortic atherosclerosis noted. Index right periaortic lymph node measures 1.6 cm, image 31 of series 2. Previously 1 cm. No  pelvic or inguinal adenopathy. Infiltrating Reproductive: Prostate is unremarkable. Other: There is no ascites or focal fluid collections within the abdomen or pelvis.  Musculoskeletal: No acute or significant osseous findings. IMPRESSION: 1. Since the previous exam there has been interval expansion of the left renal pelvis which likely represents a combination of progressive tumor and blood clot. There is a filling defect within the proximal left ureter which may represent tumor and/or blood clot. 2. Metastatic lymph node within the left periaortic region has increased in size in the interval. 3. Aortic atherosclerosis. Electronically Signed   By: Kerby Moors M.D.   On: 06/17/2016 14:57    Medications: I have reviewed the patient's current medications.  Assessment/Plan:  70 year old gentleman with the following issues:  1. 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 He did have a pathological confirmation done on 04/15/2015 with the specimen showed urothelial carcinoma.  He is currently receiving salvage therapy with Pembrolizumab which appears to stabilize his disease. CT scan obtained on 06/17/2016 indicate some mild progression although I do not see wide spread of his disease that requires treatment change. I have recommended continuing this current regimen which she will receive medically stable on 06/25/2016.  2. Hematuria: Likely related due to his renal pelvis tumor. His hemoglobin appears to be stable and he is clinically improving. I will defer further management to urology regarding the next step. Embolization of that kidney blood vessels versus nephrectomy have been mentioned if the bleeding persists. His bleeding appears to be slowing down at this time and he is clinically stable.  Disposition: I have no objections to discharge from an oncology standpoint if urology feels that he is stable to do so.   LOS: 0 days   [facility] 06/19/2016, 9:36 AM

## 2016-06-22 DIAGNOSIS — C642 Malignant neoplasm of left kidney, except renal pelvis: Secondary | ICD-10-CM | POA: Diagnosis not present

## 2016-06-22 DIAGNOSIS — R310 Gross hematuria: Secondary | ICD-10-CM | POA: Diagnosis not present

## 2016-06-25 ENCOUNTER — Ambulatory Visit: Payer: Medicare Other

## 2016-06-25 ENCOUNTER — Ambulatory Visit (HOSPITAL_BASED_OUTPATIENT_CLINIC_OR_DEPARTMENT_OTHER): Payer: Medicare Other

## 2016-06-25 ENCOUNTER — Other Ambulatory Visit (HOSPITAL_BASED_OUTPATIENT_CLINIC_OR_DEPARTMENT_OTHER): Payer: Medicare Other

## 2016-06-25 ENCOUNTER — Telehealth: Payer: Self-pay | Admitting: Oncology

## 2016-06-25 ENCOUNTER — Ambulatory Visit: Payer: Medicare Other | Admitting: Oncology

## 2016-06-25 ENCOUNTER — Ambulatory Visit (HOSPITAL_COMMUNITY)
Admission: RE | Admit: 2016-06-25 | Discharge: 2016-06-25 | Disposition: A | Discharge status: Home / Self Care | Payer: Medicare Other | Source: Ambulatory Visit | Attending: Oncology | Admitting: Oncology

## 2016-06-25 VITALS — BP 125/46 | HR 61 | Temp 98.0°F | Resp 18

## 2016-06-25 VITALS — BP 104/54 | HR 61 | Temp 98.0°F | Resp 18 | Ht 70.0 in | Wt 191.8 lb

## 2016-06-25 DIAGNOSIS — Z79899 Other long term (current) drug therapy: Secondary | ICD-10-CM | POA: Diagnosis not present

## 2016-06-25 DIAGNOSIS — R319 Hematuria, unspecified: Secondary | ICD-10-CM | POA: Diagnosis not present

## 2016-06-25 DIAGNOSIS — Z5112 Encounter for antineoplastic immunotherapy: Secondary | ICD-10-CM

## 2016-06-25 DIAGNOSIS — D649 Anemia, unspecified: Secondary | ICD-10-CM

## 2016-06-25 DIAGNOSIS — C7800 Secondary malignant neoplasm of unspecified lung: Secondary | ICD-10-CM

## 2016-06-25 DIAGNOSIS — C689 Malignant neoplasm of urinary organ, unspecified: Secondary | ICD-10-CM

## 2016-06-25 DIAGNOSIS — D500 Iron deficiency anemia secondary to blood loss (chronic): Secondary | ICD-10-CM

## 2016-06-25 DIAGNOSIS — Z95828 Presence of other vascular implants and grafts: Secondary | ICD-10-CM

## 2016-06-25 DIAGNOSIS — C652 Malignant neoplasm of left renal pelvis: Secondary | ICD-10-CM

## 2016-06-25 LAB — CBC WITH DIFFERENTIAL/PLATELET
BASO%: 0.8 % (ref 0.0–2.0)
BASOS ABS: 0.1 10*3/uL (ref 0.0–0.1)
EOS%: 2.7 % (ref 0.0–7.0)
Eosinophils Absolute: 0.2 10*3/uL (ref 0.0–0.5)
HEMATOCRIT: 25.3 % — AB (ref 38.4–49.9)
HEMOGLOBIN: 8.4 g/dL — AB (ref 13.0–17.1)
LYMPH#: 1.3 10*3/uL (ref 0.9–3.3)
LYMPH%: 17.8 % (ref 14.0–49.0)
MCH: 31.4 pg (ref 27.2–33.4)
MCHC: 33.2 g/dL (ref 32.0–36.0)
MCV: 94.6 fL (ref 79.3–98.0)
MONO#: 0.7 10*3/uL (ref 0.1–0.9)
MONO%: 9.5 % (ref 0.0–14.0)
NEUT#: 5.2 10*3/uL (ref 1.5–6.5)
NEUT%: 69.2 % (ref 39.0–75.0)
PLATELETS: 175 10*3/uL (ref 140–400)
RBC: 2.68 10*6/uL — ABNORMAL LOW (ref 4.20–5.82)
RDW: 16.5 % — AB (ref 11.0–14.6)
WBC: 7.6 10*3/uL (ref 4.0–10.3)

## 2016-06-25 LAB — COMPREHENSIVE METABOLIC PANEL
ALBUMIN: 3.2 g/dL — AB (ref 3.5–5.0)
ALK PHOS: 73 U/L (ref 40–150)
ALT: 19 U/L (ref 0–55)
ANION GAP: 7 meq/L (ref 3–11)
AST: 30 U/L (ref 5–34)
BUN: 19.8 mg/dL (ref 7.0–26.0)
CALCIUM: 8.7 mg/dL (ref 8.4–10.4)
CHLORIDE: 104 meq/L (ref 98–109)
CO2: 25 mEq/L (ref 22–29)
CREATININE: 1.5 mg/dL — AB (ref 0.7–1.3)
EGFR: 48 mL/min/{1.73_m2} — ABNORMAL LOW (ref >90)
Glucose: 124 mg/dl (ref 70–140)
Potassium: 4.1 mEq/L (ref 3.5–5.1)
Sodium: 136 mEq/L (ref 136–145)
Total Bilirubin: 0.61 mg/dL (ref 0.20–1.20)
Total Protein: 6 g/dL — ABNORMAL LOW (ref 6.4–8.3)

## 2016-06-25 LAB — PREPARE RBC (CROSSMATCH)

## 2016-06-25 LAB — TSH: TSH: 122.802 m[IU]/L — AB (ref 0.320–4.118)

## 2016-06-25 MED ORDER — ACETAMINOPHEN 325 MG PO TABS
650.0000 mg | ORAL_TABLET | Freq: Once | ORAL | Status: AC
  Administered 2016-06-25: 650 mg via ORAL

## 2016-06-25 MED ORDER — HEPARIN SOD (PORK) LOCK FLUSH 100 UNIT/ML IV SOLN
500.0000 [IU] | Freq: Every day | INTRAVENOUS | Status: AC | PRN
  Administered 2016-06-25: 500 [IU]
  Filled 2016-06-25: qty 5

## 2016-06-25 MED ORDER — DIPHENHYDRAMINE HCL 25 MG PO CAPS
ORAL_CAPSULE | ORAL | Status: AC
  Filled 2016-06-25: qty 1

## 2016-06-25 MED ORDER — SODIUM CHLORIDE 0.9% FLUSH
10.0000 mL | INTRAVENOUS | Status: DC | PRN
  Filled 2016-06-25: qty 10

## 2016-06-25 MED ORDER — HEPARIN SOD (PORK) LOCK FLUSH 100 UNIT/ML IV SOLN
500.0000 [IU] | Freq: Once | INTRAVENOUS | Status: DC | PRN
  Filled 2016-06-25: qty 5

## 2016-06-25 MED ORDER — SODIUM CHLORIDE 0.9 % IJ SOLN
10.0000 mL | INTRAMUSCULAR | Status: DC | PRN
  Administered 2016-06-25: 10 mL via INTRAVENOUS
  Filled 2016-06-25: qty 10

## 2016-06-25 MED ORDER — ACETAMINOPHEN 325 MG PO TABS
ORAL_TABLET | ORAL | Status: AC
  Filled 2016-06-25: qty 2

## 2016-06-25 MED ORDER — DIPHENHYDRAMINE HCL 25 MG PO CAPS
25.0000 mg | ORAL_CAPSULE | Freq: Once | ORAL | Status: AC
  Administered 2016-06-25: 25 mg via ORAL

## 2016-06-25 MED ORDER — SODIUM CHLORIDE 0.9 % IV SOLN
Freq: Once | INTRAVENOUS | Status: AC
  Administered 2016-06-25: 11:00:00 via INTRAVENOUS

## 2016-06-25 MED ORDER — SODIUM CHLORIDE 0.9% FLUSH
10.0000 mL | INTRAVENOUS | Status: AC | PRN
  Administered 2016-06-25: 10 mL
  Filled 2016-06-25: qty 10

## 2016-06-25 MED ORDER — SODIUM CHLORIDE 0.9 % IV SOLN
200.0000 mg | Freq: Once | INTRAVENOUS | Status: AC
  Administered 2016-06-25: 200 mg via INTRAVENOUS
  Filled 2016-06-25: qty 8

## 2016-06-25 NOTE — Telephone Encounter (Signed)
Blood product, labs, flush, chemo and follow up with Dr [name] was scheduled per 06/25/16 los. Patient was given a copy of the AVS report and appointment schedule, per 06/25/16 los.

## 2016-06-25 NOTE — Patient Instructions (Addendum)
[place] Discharge Instructions for Patients Receiving Chemotherapy  Today you received the following chemotherapy agents Keytruda To help prevent nausea and vomiting after your treatment, we encourage you to take your nausea medication as prescribed.   If you develop nausea and vomiting that is not controlled by your nausea medication, call the clinic.   BELOW ARE SYMPTOMS THAT SHOULD BE REPORTED IMMEDIATELY:  *FEVER GREATER THAN 100.5 F  *CHILLS WITH OR WITHOUT FEVER  NAUSEA AND VOMITING THAT IS NOT CONTROLLED WITH YOUR NAUSEA MEDICATION  *UNUSUAL SHORTNESS OF BREATH  *UNUSUAL BRUISING OR BLEEDING  TENDERNESS IN MOUTH AND THROAT WITH OR WITHOUT PRESENCE OF ULCERS  *URINARY PROBLEMS  *BOWEL PROBLEMS  UNUSUAL RASH Items with * indicate a potential emergency and should be followed up as soon as possible.  Feel free to call the clinic you have any questions or concerns. The clinic phone number is [phone].  Please show the [place] at check-in to the Emergency Department and triage nurse.   Blood Transfusion , Adult A blood transfusion is a procedure in which you receive donated blood, including plasma, platelets, and red blood cells, through an IV tube. You may need a blood transfusion because of illness, surgery, or injury. The blood may come from a donor. You may also be able to donate blood for yourself (autologous blood donation) before a surgery if you know that you might require a blood transfusion. The blood given in a transfusion is made up of different types of cells. You may receive:  Red blood cells. These carry oxygen to the cells in the body.  White blood cells. These help you fight infections.  Platelets. These help your blood to clot.  Plasma. This is the liquid part of your blood and it helps with fluid imbalances. If you have hemophilia or another clotting disorder, you may also receive other types of blood products. Tell a  health care provider about:  Any allergies you have.  All medicines you are taking, including vitamins, herbs, eye drops, creams, and over-the-counter medicines.  Any problems you or family members have had with anesthetic medicines.  Any blood disorders you have.  Any surgeries you have had.  Any medical conditions you have, including any recent fever or cold symptoms.  Whether you are pregnant or may be pregnant.  Any previous reactions you have had during a blood transfusion. What are the risks? Generally, this is a safe procedure. However, problems may occur, including:  Having an allergic reaction to something in the donated blood. Hives and itching may be symptoms of this type of reaction.  Fever. This may be a reaction to the white blood cells in the transfused blood. Nausea or chest pain may accompany a fever.  Iron overload. This can happen from having many transfusions.  Transfusion-related acute lung injury (TRALI). This is a rare reaction that causes lung damage. The cause is not known.TRALI can occur within hours of a transfusion or several days later.  Sudden (acute) or delayed hemolytic reactions. This happens if your blood does not match the cells in your transfusion. Your body's defense system (immune system) may try to attack the new cells. This complication is rare. The symptoms include fever, chills, nausea, and low back pain or chest pain.  Infection or disease transmission. This is rare. What happens before the procedure?  You will have a blood test to determine your blood type. This is necessary to know what kind of blood your body  will accept and to match it to the donor blood.  If you are going to have a planned surgery, you may be able to do an autologous blood donation. This may be done in case you need to have a transfusion.  If you have had an allergic reaction to a transfusion in the past, you may be given medicine to help prevent a reaction. This  medicine may be given to you by mouth or through an IV tube.  You will have your temperature, blood pressure, and pulse monitored before the transfusion.  Follow instructions from your health care provider about eating and drinking restrictions.  Ask your health care provider about:  Changing or stopping your regular medicines. This is especially important if you are taking diabetes medicines or blood thinners.  Taking medicines such as aspirin and ibuprofen. These medicines can thin your blood. Do not take these medicines before your procedure if your health care provider instructs you not to. What happens during the procedure?  An IV tube will be inserted into one of your veins.  The bag of donated blood will be attached to your IV tube. The blood will then enter through your vein.  Your temperature, blood pressure, and pulse will be monitored regularly during the transfusion. This monitoring is done to detect early signs of a transfusion reaction.  If you have any signs or symptoms of a reaction, your transfusion will be stopped and you may be given medicine.  When the transfusion is complete, your IV tube will be removed.  Pressure may be applied to the IV site for a few minutes.  A bandage (dressing) will be applied. The procedure may vary among health care providers and hospitals. What happens after the procedure?  Your temperature, blood pressure, heart rate, breathing rate, and blood oxygen level will be monitored often.  Your blood may be tested to see how you are responding to the transfusion.  You may be warmed with fluids or blankets to maintain a normal body temperature. Summary  A blood transfusion is a procedure in which you receive donated blood, including plasma, platelets, and red blood cells, through an IV tube.  Your temperature, blood pressure, and pulse will be monitored before, during, and after the transfusion.  Your blood may be tested after the  transfusion to see how your body has responded. This information is not intended to replace advice given to you by your health care provider. Make sure you discuss any questions you have with your health care provider. Document Released: 06/11/2000 Document Revised: 03/11/2016 Document Reviewed: 03/11/2016 Elsevier Interactive Patient Education  2017 [employer].

## 2016-06-25 NOTE — Progress Notes (Signed)
Hematology and Oncology Follow Up Visit  PERMAN YAHOLA [id number] 1945-09-08 70 y.o. 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 He presented with 4.9 cm mass of the upper pole of the left kidney with documented pulmonary metastasis. Neurological confirmation done on 04/15/2015 with a biopsy showed urothelial carcinoma.   Prior Therapy:   Status post biopsy of the left renal mass done on 04/15/2015. He is status post Port-A-Cath insertion on 05/21/2015. Systemic chemotherapy in the form of cisplatin and gemcitabine started on 05/27/2015. He is status post 6 cycles completed on 09/16/2015. CT scan on 01/06/2016 showed progression of disease.  Current therapy: Pembrolizumab 200 mg every 3 weeks cycle 1 on 02/13/2016. He is here for cycle 7 of therapy.  Interim History:  Mr. Lindig presents today for a follow-up visit. Since the last visit, he was hospitalized briefly up till 06/19/2016. He developed acute hematuria that required brief hospitalization and transfusion. Since his discharge, he continues to have intermittent hematuria especially in the daytime but subsides in the afternoon to evening. He does report intermittent clots but no brisk bleeding as previously noted. He is reporting more fatigue and tiredness and occasional dyspnea on exertion.    He tolerated the last cycle of Pembrolizumab without any denied any nausea, abdominal pain or respiratory distress. He denied any wheezing or hemoptysis. He denied any back pain or shoulder pain. He denied any pelvic pain.    He does not report any headaches, blurry vision, syncope or seizures. He does not report any fevers, chills, or sweats. He does not report any chest pain, palpitation, orthopnea or leg edema. He does not report any wheezing or hemoptysis. He does not report any  vomiting, abdominal  pain does report occasional dyspepsia. He does not report any frequency, urgency or hesitancy. He does not report any skeletal complaints. Remaining review of systems unremarkable.   Medications: I have reviewed the patient's current medications.  Current Outpatient Prescriptions  Medication Sig Dispense Refill  . acetaminophen (TYLENOL) 325 MG tablet Take 650 mg by mouth every 6 (six) hours as needed for moderate pain or headache.     [name] atorvastatin (LIPITOR) 40 MG tablet Take 1 tablet (40 mg total) by mouth daily. (Patient taking differently: Take 40 mg by mouth every morning. ) 90 tablet 3  . benzonatate (TESSALON) 200 MG capsule Take 1 capsule (200 mg total) by mouth 3 (three) times daily as needed for cough. 20 capsule 0  . Cholecalciferol (VITAMIN D) 1000 UNITS capsule Take 1,000 Units by mouth daily.      [name] docusate sodium (COLACE) 100 MG capsule Take 100 mg by mouth 2 (two) times daily as needed (constipation).    . famotidine (PEPCID) 20 MG tablet Take 1 tablet (20 mg total) by mouth daily. 30 tablet 2  . lidocaine-prilocaine (EMLA) cream Apply to port-a-cath 1-2 hours prior to acces. Cover with saran wrap. 30 g 1  . lisinopril (PRINIVIL,ZESTRIL) 5 MG tablet     . metoprolol tartrate (LOPRESSOR) 25 MG tablet Take 0.5 tablets (12.5 mg total) by mouth 2 (two) times daily. 90 tablet 3  . nitroGLYCERIN (NITROSTAT) 0.4 MG SL tablet Place 1 tablet (0.4 mg total) under the tongue every 5 (five) minutes as needed. May repeat up to 3 doses. 100 tablet 3  . polycarbophil (FIBERCON) 625 MG tablet Take 625 mg by mouth 2 (two) times daily.     [name]  PRESCRIPTION MEDICATION Inject 160 mg into the muscle daily. Gentamycin given for 3 days at [facility]    . sildenafil (VIAGRA) 50 MG tablet Take 50 mg by mouth as needed for erectile dysfunction.      No current facility-administered medications for this visit.      Allergies:  Allergies  Allergen Reactions  . Hydrocodone Other (See Comments)     Became too sedated    Past Medical History, Surgical history, Social history, and Family History were reviewed and updated.   Physical Exam: Blood pressure (!) 104/54, pulse 61, temperature 98 F (36.7 C), temperature source Oral, resp. rate 18, height 5\' 10"  (1.778 m), weight 191 lb 12.8 oz (87 kg), SpO2 100 %. ECOG: 0 General appearance: Alert, awake gentleman Appeared fatigued. Head: Normocephalic, without obvious abnormality no oral thrush noted. Neck: no adenopathy no thyroid masses. Lymph nodes: Cervical, supraclavicular, and axillary nodes normal. Heart:regular rate and rhythm, S1, S2 normal, no murmur, click, rub or gallop Lung:chest clear, no wheezing, rales, normal symmetric air entry Abdomin: soft, non-tender, without masses or organomegaly no shifting dullness or ascites. EXT:no erythema, induration, or nodules Skin: No rashes or lesions.  Lab Results: Lab Results  Component Value Date   WBC 7.6 06/25/2016   HGB 8.4 (L) 06/25/2016   HCT 25.3 (L) 06/25/2016   MCV 94.6 06/25/2016   PLT 175 06/25/2016     Chemistry      Component Value Date/Time   NA 136 06/25/2016 0926   K 4.1 06/25/2016 0926   CL 104 06/19/2016 0537   CO2 25 06/25/2016 0926   BUN 19.8 06/25/2016 0926   CREATININE 1.5 (H) 06/25/2016 0926      Component Value Date/Time   CALCIUM 8.7 06/25/2016 0926   ALKPHOS 73 06/25/2016 0926   AST 30 06/25/2016 0926   ALT 19 06/25/2016 0926   BILITOT 0.61 06/25/2016 0926     MPRESSION: 1. Since the previous exam there has been interval expansion of the left renal pelvis which likely represents a combination of progressive tumor and blood clot. There is a filling defect within the proximal left ureter which may represent tumor and/or blood clot. 2. Metastatic lymph node within the left periaortic region has increased in size in the interval. 3. Aortic atherosclerosis.  Impression and Plan:   70 year old gentleman with the following issues:  1.  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 He did have a pathological confirmation done on 04/15/2015 with the specimen showed urothelial carcinoma.  He is status post 6 cycles of therapy completed in March 2017. CT scan on 04/11/2017showed continuous response to therapy. He had further decrease in his bilateral pulmonary nodules as well as decrease in the left upper pole renal lesion.  Repeat CT scan on 01/06/2016 showed mild progression of disease especially in his primary tumor. His pulmonary nodules have remained stable.   He is currently receiving Pembrolizumab and continues to tolerated very well. CT scan on 06/17/2016 did not show rapid progression of disease.  The plan is to continue with the same dose and schedule given his overall improvement in quality of life on this treatment.  2. Nausea and GI complication prophylaxis: Antiemetics regimen is available to him as needed. No issues noted at this time.  3. IV access: Port-A-Cath remained in use without any complications.  4. Hematuria: Related to his renal pelvis tumor. He continues to have that intermittently although not as dramatic as it was in the last week. He does have follow up with urology next week to discuss whether a nephrectomy versus embolization would be needed.  5. Anemia: He is symptomatic at this time related to blood loss. I will set him up for 2 units of packed red cell transfusion prophylactically in the next 24-48 hours.  6. Autoimmune surveillance: Thyroid function will be checked periodically. His TSH was elevated and we will repeated with the next visit as well as repeat T3 and T4.  7. Follow-up: Will be in 3 weeks for the next cycle of therapy.  [facility], MD 12/29/201710:24 AM

## 2016-06-26 LAB — T3 UPTAKE
Free Thyroxine Index: 0.1 — ABNORMAL LOW (ref 1.2–4.9)
T3 Uptake Ratio: 18 % — ABNORMAL LOW (ref 24–39)

## 2016-06-26 LAB — T4: Thyroxine (T4): 0.5 ug/dL — ABNORMAL LOW (ref 4.5–12.0)

## 2016-06-26 LAB — T4, FREE

## 2016-06-29 ENCOUNTER — Ambulatory Visit (HOSPITAL_COMMUNITY)
Admission: RE | Admit: 2016-06-29 | Discharge: 2016-06-29 | Disposition: A | Discharge status: Home / Self Care | Payer: Medicare Other | Source: Ambulatory Visit | Attending: Oncology | Admitting: Oncology

## 2016-06-29 DIAGNOSIS — C689 Malignant neoplasm of urinary organ, unspecified: Secondary | ICD-10-CM | POA: Insufficient documentation

## 2016-06-29 DIAGNOSIS — R310 Gross hematuria: Secondary | ICD-10-CM | POA: Diagnosis not present

## 2016-06-29 DIAGNOSIS — D649 Anemia, unspecified: Secondary | ICD-10-CM | POA: Insufficient documentation

## 2016-06-29 LAB — TYPE AND SCREEN
BLOOD PRODUCT EXPIRATION DATE: 201801122359
Blood Product Expiration Date: 201801122359
ISSUE DATE / TIME: 201712291205
ISSUE DATE / TIME: 201712291205
UNIT TYPE AND RH: 7300
Unit Type and Rh: 7300

## 2016-07-02 ENCOUNTER — Other Ambulatory Visit: Payer: Self-pay | Admitting: Oncology

## 2016-07-02 ENCOUNTER — Encounter: Payer: Self-pay | Admitting: *Deleted

## 2016-07-02 ENCOUNTER — Other Ambulatory Visit (HOSPITAL_BASED_OUTPATIENT_CLINIC_OR_DEPARTMENT_OTHER): Payer: Medicare Other

## 2016-07-02 ENCOUNTER — Inpatient Hospital Stay (HOSPITAL_COMMUNITY): Admission: RE | Admit: 2016-07-02 | Payer: Medicare Other | Source: Ambulatory Visit

## 2016-07-02 ENCOUNTER — Telehealth: Payer: Self-pay | Admitting: *Deleted

## 2016-07-02 DIAGNOSIS — C7800 Secondary malignant neoplasm of unspecified lung: Secondary | ICD-10-CM | POA: Diagnosis not present

## 2016-07-02 DIAGNOSIS — C652 Malignant neoplasm of left renal pelvis: Secondary | ICD-10-CM | POA: Diagnosis not present

## 2016-07-02 DIAGNOSIS — C689 Malignant neoplasm of urinary organ, unspecified: Secondary | ICD-10-CM

## 2016-07-02 DIAGNOSIS — D649 Anemia, unspecified: Secondary | ICD-10-CM

## 2016-07-02 LAB — CBC WITH DIFFERENTIAL/PLATELET
BASO%: 0.9 % (ref 0.0–2.0)
BASOS ABS: 0.1 10*3/uL (ref 0.0–0.1)
EOS%: 2.6 % (ref 0.0–7.0)
Eosinophils Absolute: 0.2 10*3/uL (ref 0.0–0.5)
HEMATOCRIT: 29.8 % — AB (ref 38.4–49.9)
HEMOGLOBIN: 9.8 g/dL — AB (ref 13.0–17.1)
LYMPH#: 1.2 10*3/uL (ref 0.9–3.3)
LYMPH%: 17.3 % (ref 14.0–49.0)
MCH: 30.4 pg (ref 27.2–33.4)
MCHC: 32.8 g/dL (ref 32.0–36.0)
MCV: 92.8 fL (ref 79.3–98.0)
MONO#: 0.7 10*3/uL (ref 0.1–0.9)
MONO%: 10.4 % (ref 0.0–14.0)
NEUT%: 68.8 % (ref 39.0–75.0)
NEUTROS ABS: 4.9 10*3/uL (ref 1.5–6.5)
Platelets: 181 10*3/uL (ref 140–400)
RBC: 3.21 10*6/uL — ABNORMAL LOW (ref 4.20–5.82)
RDW: 17.4 % — AB (ref 11.0–14.6)
WBC: 7.2 10*3/uL (ref 4.0–10.3)

## 2016-07-02 MED ORDER — LEVOTHYROXINE SODIUM 25 MCG PO TABS: 25.0000 ug | ORAL_TABLET | Freq: Every day | ORAL | 1 refills | Status: DC

## 2016-07-02 NOTE — Progress Notes (Signed)
Called in synthroid 25 mcg /w 1 refill to patient's pharmacy [facility] [address]

## 2016-07-02 NOTE — Telephone Encounter (Signed)
Spoke with wife, will call in script for synthroid to patient's pharmacy.

## 2016-07-05 ENCOUNTER — Other Ambulatory Visit: Payer: Self-pay | Admitting: Urology

## 2016-07-05 DIAGNOSIS — N289 Disorder of kidney and ureter, unspecified: Secondary | ICD-10-CM

## 2016-07-06 ENCOUNTER — Other Ambulatory Visit: Payer: Self-pay | Admitting: Interventional Radiology

## 2016-07-06 ENCOUNTER — Ambulatory Visit
Admission: RE | Admit: 2016-07-06 | Discharge: 2016-07-06 | Disposition: A | Discharge status: Home / Self Care | Payer: Medicare Other | Source: Ambulatory Visit | Attending: Urology | Admitting: Urology

## 2016-07-06 ENCOUNTER — Encounter: Payer: Self-pay | Admitting: Radiology

## 2016-07-06 DIAGNOSIS — N289 Disorder of kidney and ureter, unspecified: Secondary | ICD-10-CM

## 2016-07-06 DIAGNOSIS — C642 Malignant neoplasm of left kidney, except renal pelvis: Secondary | ICD-10-CM

## 2016-07-06 HISTORY — PX: IR GENERIC HISTORICAL: IMG1180011

## 2016-07-06 NOTE — Consult Note (Signed)
Chief Complaint: Patient was seen in consultation today for persistent gross hematuria secondary to left renal urothelial carcinoma at the request of Nickie Retort  Referring Physician(s): Nickie Retort  History of Present Illness: [name] [name] is a 71 y.o. male with a history of renal urothelial carcinoma metastatic to a left para-aortic lymph node and lung who initially presented with a 5 cm left renal mass that extended into the upper pole collecting system in September, 2016. Ultrasound-guided biopsy of the left renal mass on 04/15/2015 demonstrated high-grade invasive urothelial carcinoma. He was treated initially with systemic cisplatin and gemcitabine by Dr. [name] and was switched to [facility] every 3 weeks in August, 2017.  Recently beginning on 06/09/2016, [name] [name] has experienced significant gross hematuria. He required 2 unit blood transfusions on both 06/17/2016 and 06/25/2016. CT on 06/17/2016 demonstrates extension of tumor into the renal pelvis and proximal ureter with residual tumor also in the upper and interpolar left renal parenchyma. He continues to have hematuria but states that bleeding has decreased slightly over the last week. He continues to pass clots every time he voids. He has not had any significant urinary retention/obstruction. He denies any significant flank, abdominal or pelvic pain. He does experience some occasional mild left-sided pelvic pain when lying down.  Past Medical History:  Diagnosis Date  . Atrial fibrillation (HCC)    Breif, post-op CABG  . bladder ca dx'd 03/2015  . CAD (coronary artery disease)    Exertional chest pain prompted LHC 10/11 showing EF 55%, mild inferior hypokinesis, 90% prox RCA, 70% mid RCA, 80% distal RCA, 70% ostial PDA, 70% mPLV, 90% mid OM1 (large), 90-95% prox LAD. Pt had CABG with LIMA-LAD, SVG-OMG1, seq SVG-PDA/PLV  . COPD (chronic obstructive pulmonary disease) ([place])    Quit smoking 10/11  . ED  (erectile dysfunction) of organic origin   . GERD (gastroesophageal reflux disease)   . Hypertension     Past Surgical History:  Procedure Laterality Date  . CORONARY ARTERY BYPASS GRAFT     LIMA-LAD, SVG-OM1, seq SVG-PDA/PLV  . CYSTOSCOPY WITH RETROGRADE PYELOGRAM, URETEROSCOPY AND STENT PLACEMENT Left 04/08/2015   Procedure: CYSTOSCOPY WITH LEFT  RETROGRADE PYELOGRAM, BALLOON DILATION LEFT URETER, LEFT URETEROSCOPY,LEFT STENT PLACEMENT;  Surgeon: Nickie Retort, MD;  Location: [facility];  Service: Urology;  Laterality: Left;    Allergies: Hydrocodone  Medications: Prior to Admission medications   Medication Sig Start Date End Date Taking? Authorizing Provider  acetaminophen (TYLENOL) 325 MG tablet Take 650 mg by mouth every 6 (six) hours as needed for moderate pain or headache.     Historical Provider, MD  atorvastatin (LIPITOR) 40 MG tablet Take 1 tablet (40 mg total) by mouth daily. Patient taking differently: Take 40 mg by mouth every morning.  05/11/16   Larey Dresser, MD  benzonatate (TESSALON) 200 MG capsule Take 1 capsule (200 mg total) by mouth 3 (three) times daily as needed for cough. 05/28/16   Wyatt Portela, MD  Cholecalciferol (VITAMIN D) 1000 UNITS capsule Take 1,000 Units by mouth daily.      Historical Provider, MD  docusate sodium (COLACE) 100 MG capsule Take 100 mg by mouth 2 (two) times daily as needed (constipation).    Historical Provider, MD  famotidine (PEPCID) 20 MG tablet Take 1 tablet (20 mg total) by mouth daily. 06/19/16   Eugenie Filler, MD  levothyroxine (SYNTHROID) 25 MCG tablet Take 1 tablet (25 mcg total) by mouth daily before breakfast. 07/02/16  Wyatt Portela, MD  lidocaine-prilocaine (EMLA) cream Apply to port-a-cath 1-2 hours prior to acces. Cover with saran wrap. 05/16/15   Wyatt Portela, MD  lisinopril (PRINIVIL,ZESTRIL) 5 MG tablet  05/11/16   Historical Provider, MD  metoprolol tartrate (LOPRESSOR) 25 MG tablet Take 0.5 tablets (12.5 mg  total) by mouth 2 (two) times daily. 05/11/16   Larey Dresser, MD  nitroGLYCERIN (NITROSTAT) 0.4 MG SL tablet Place 1 tablet (0.4 mg total) under the tongue every 5 (five) minutes as needed. May repeat up to 3 doses. 03/07/15   Larey Dresser, MD  polycarbophil (FIBERCON) 625 MG tablet Take 625 mg by mouth 2 (two) times daily.     Historical Provider, MD  PRESCRIPTION MEDICATION Inject 160 mg into the muscle daily. Gentamycin given for 3 days at [facility]    Historical Provider, MD  sildenafil (VIAGRA) 50 MG tablet Take 50 mg by mouth as needed for erectile dysfunction.     Historical Provider, MD     Family History  Problem Relation Age of Onset  . Arthritis Mother   . Hypertension Mother   . Sudden death Mother 66  . Arthritis Father   . Hypertension Father   . Lung cancer Father   . Stroke Maternal Uncle   . Heart attack Neg Hx     Social History   Social History  . Marital status: Married    Spouse name: N/A  . Number of children: N/A  . Years of education: N/A   Occupational History  . 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 Smoking status: Former Smoker    Quit date: 07/07/2009  . Smokeless tobacco: Former Systems developer  . Alcohol use No  . Drug use: No  . Sexual activity: Not on file   Other Topics Concern  . Not on file   Social History Narrative   Married   3 healthy children   Regular exercise   Caffeinated beverages   Dentures      Designated Party Form signed on 01/06/2010 appointing Ward Chatters. May leave message on cell phone.    ECOG Status: 1 - Symptomatic but completely ambulatory  Review of Systems: A 12 point ROS discussed and pertinent positives are indicated in the HPI above.  All other systems are negative.  Review of Systems  Constitutional: Negative.   Respiratory: Negative.   Cardiovascular: Negative.   Gastrointestinal: Negative.   Genitourinary: Positive for hematuria. Negative for difficulty  urinating, dysuria, flank pain, frequency and penile pain.  Musculoskeletal: Negative.   Neurological: Negative.     Vital Signs: BP (!) 119/58 (BP Location: Left Arm, Patient Position: Sitting, Cuff Size: Normal)   Pulse 76   Temp 98.1 F (36.7 C) (Oral)   Resp 15   Ht _0  (1.778 m)   Wt 180 lb (81.6 kg)   SpO2 100%   BMI 25.83 kg/m   Physical Exam  Constitutional: He is oriented to person, place, and time. He appears well-developed and well-nourished. No distress.  HENT:  Head: Normocephalic and atraumatic.  Neck: No JVD present.  Cardiovascular: Normal rate, regular rhythm and normal heart sounds.  Exam reveals no gallop and no friction rub.   No murmur heard. Pulmonary/Chest: Effort normal and breath sounds normal. No stridor. No respiratory distress. He has no wheezes. He has no rales.  Abdominal: Soft. Bowel sounds are normal. He exhibits no distension and no mass. There is  no tenderness. There is no rebound and no guarding.  Musculoskeletal: He exhibits no edema.  Neurological: He is alert and oriented to person, place, and time.  Skin: Skin is warm and dry. He is not diaphoretic.  Nursing note and vitals reviewed.     Imaging: Ct Abdomen Pelvis W Contrast  Result Date: 06/17/2016 CLINICAL DATA:  Hematuria.  Urothelial carcinoma EXAM: CT ABDOMEN AND PELVIS WITH CONTRAST TECHNIQUE: Multidetector CT imaging of the abdomen and pelvis was performed using the standard protocol following bolus administration of intravenous contrast. CONTRAST:  67m ISOVUE-300 IOPAMIDOL (ISOVUE-300) INJECTION 61% COMPARISON:  04/14/2016 FINDINGS: Lower chest: Scar like densities noted within the left lower lobe. No pleural fluid. Hepatobiliary: Right kidney cyst measures 1.7 cm. Gallbladder appears normal. No biliary dilatation. Pancreas: Unremarkable. No pancreatic ductal dilatation or surrounding inflammatory changes. Spleen: Normal appearance of the spleen. Adrenals/Urinary Tract: Right  kidney is normal. No mass or hydronephrosis. Infiltrating mass involving the upper pole of the left kidney is again identified. There is been interval expansion of the right renal pelvis which now measures 1.4 cm, image 31 of series 2. Previously 5 mm. Filling defect within the proximal right ureter is identified which may represent clot and/or tumor. Stomach/Bowel: Small hiatal hernia. The stomach is otherwise unremarkable. Normal appearance of the small bowel loops. Thickened appendix is identified within the right iliac fossa measuring 1 cm in diameter, image 65 of series 2. Previously this measured the same. No pathologic dilatation of the colon. Vascular/Lymphatic: Aortic atherosclerosis noted. Index right periaortic lymph node measures 1.6 cm, image 31 of series 2. Previously 1 cm. No pelvic or inguinal adenopathy. Infiltrating Reproductive: Prostate is unremarkable. Other: There is no ascites or focal fluid collections within the abdomen or pelvis. Musculoskeletal: No acute or significant osseous findings. IMPRESSION: 1. Since the previous exam there has been interval expansion of the left renal pelvis which likely represents a combination of progressive tumor and blood clot. There is a filling defect within the proximal left ureter which may represent tumor and/or blood clot. 2. Metastatic lymph node within the left periaortic region has increased in size in the interval. 3. Aortic atherosclerosis. Electronically Signed   By: TKerby MoorsM.D.   On: 06/17/2016 14:57    Labs:  CBC:  Recent Labs  06/17/16 1303 06/18/16 0540 06/18/16 1837 06/19/16 0537 06/25/16 0925 07/02/16 0923  WBC 8.1 5.6  --   --  7.6 7.2  HGB 8.0* 8.9* 10.0* 9.3* 8.4* 9.8*  HCT 24.0* 25.5* 29.3* 27.3* 25.3* 29.8*  PLT 164 139*  --   --  175 181    COAGS:  Recent Labs  06/18/16 0540  INR 1.04    BMP:  Recent Labs  06/17/16 1303 06/18/16 0540 06/19/16 0537 06/25/16 0926  NA 136 135 138 136  K 4.0 4.1  3.9 4.1  CL 103 103 104  --   CO2 _0 GLUCOSE 101* 102* 98 124  BUN 23* 20 20 19.8  CALCIUM 8.8* 8.6* 8.8* 8.7  CREATININE 1.53* 1.31* 1.40* 1.5*  GFRNONAA 44* 53* 49*  --   GFRAA 51* >60 56*  --     LIVER FUNCTION TESTS:  Recent Labs  04/16/16 0850 05/07/16 1122 05/28/16 0826 06/25/16 0926  BILITOT 0.81 0.97 0.98 0.61  AST 27 28 43* 30  ALT _1 ALKPHOS 90 92 85 73  PROT 6.4 6.7 6.9 6.0*  ALBUMIN 3.4* 3.5 3.1* 3.2*    Assessment  and Plan:  I met with [name] [name] and his wife. We reviewed imaging studies including the recent CT on 12/21. He is scheduled to have another restaging CT study on 1/15. It does appear that gross hematuria has improved slightly over the last week. However, he is still passing clots every time he voids and recent hematuria was enough to require 2 separate blood transfusions. I discussed the possibility of performing left renal arteriography with possible embolization of tumor blood supply to try to help diminish bleeding. Culprit tumor for gross hematuria is likely the component of tumor in the collecting system with clear component distending the renal pelvis and extending into the proximal ureter by CT.  In this location, it may be more challenging to perform selective embolization given the likelihood of multiple small feeding branch vessels supplying tumor compared to a more peripheral renal mass.  I do feel that it is worthwhile to pursue at least selective left renal arteriography to define arterial blood supply to the tumor as well as normal renal parenchyma. There may be well delineated tumor branch vessels that can be safely embolized while sparing supply to normal parenchyma.  After discussing options including continued surveillance, arteriography with possible embolization and surgery, the patient is interested in pursuing arteriography. We will begin the scheduling process at [facility]. I will also review the upcoming  restaging CT of the abdomen and pelvis to be performed on 07/12/2016. I have requested that an arterial phase also be performed of the abdomen at that time to better define arterial anatomy prior to future arteriography. I would anticipate being able to perform the arteriographic procedure in approximately 1-2 weeks.  Thank you for this interesting consult.  I greatly enjoyed meeting [name] [name] and look forward to participating in their care.  A copy of this report was sent to the requesting provider on this date.  Electronically SignedAletta Edouard T 07/06/2016, 12:10 PM   I spent a total of 40 Minutes in face to face in clinical consultation, greater than 50% of which was counseling/coordinating care for management of gross hematuria related to a left renal urothelial carcinoma.

## 2016-07-09 ENCOUNTER — Other Ambulatory Visit (HOSPITAL_BASED_OUTPATIENT_CLINIC_OR_DEPARTMENT_OTHER): Payer: Medicare Other

## 2016-07-09 ENCOUNTER — Ambulatory Visit: Payer: Medicare Other

## 2016-07-09 DIAGNOSIS — D649 Anemia, unspecified: Secondary | ICD-10-CM

## 2016-07-09 DIAGNOSIS — C652 Malignant neoplasm of left renal pelvis: Secondary | ICD-10-CM | POA: Diagnosis not present

## 2016-07-09 DIAGNOSIS — C7800 Secondary malignant neoplasm of unspecified lung: Secondary | ICD-10-CM | POA: Diagnosis not present

## 2016-07-09 DIAGNOSIS — C689 Malignant neoplasm of urinary organ, unspecified: Secondary | ICD-10-CM

## 2016-07-09 LAB — CBC WITH DIFFERENTIAL/PLATELET
BASO%: 1.3 % (ref 0.0–2.0)
Basophils Absolute: 0.1 10*3/uL (ref 0.0–0.1)
EOS ABS: 0.2 10*3/uL (ref 0.0–0.5)
EOS%: 3.2 % (ref 0.0–7.0)
HEMATOCRIT: 24.4 % — AB (ref 38.4–49.9)
HEMOGLOBIN: 8.2 g/dL — AB (ref 13.0–17.1)
LYMPH#: 1.2 10*3/uL (ref 0.9–3.3)
LYMPH%: 19.1 % (ref 14.0–49.0)
MCH: 31.1 pg (ref 27.2–33.4)
MCHC: 33.6 g/dL (ref 32.0–36.0)
MCV: 92.5 fL (ref 79.3–98.0)
MONO#: 0.6 10*3/uL (ref 0.1–0.9)
MONO%: 9.8 % (ref 0.0–14.0)
NEUT#: 4.3 10*3/uL (ref 1.5–6.5)
NEUT%: 66.6 % (ref 39.0–75.0)
PLATELETS: 203 10*3/uL (ref 140–400)
RBC: 2.64 10*6/uL — AB (ref 4.20–5.82)
RDW: 17.1 % — ABNORMAL HIGH (ref 11.0–14.6)
WBC: 6.4 10*3/uL (ref 4.0–10.3)

## 2016-07-09 NOTE — Progress Notes (Signed)
Spoke with patient regarding blood transfusion for today. Patient is asymptomatic. He is not having any bleeding, no SOB, not fatigued. He doesn't think he needs the blood transfusion. Patient instructed to call the office next week if there is any changes. Patient verbalized understanding. Infusion room nurse notified.

## 2016-07-12 ENCOUNTER — Ambulatory Visit (HOSPITAL_COMMUNITY)
Admission: RE | Admit: 2016-07-12 | Discharge: 2016-07-12 | Disposition: A | Discharge status: Home / Self Care | Payer: Medicare Other | Source: Ambulatory Visit | Attending: Oncology | Admitting: Oncology

## 2016-07-12 ENCOUNTER — Encounter (HOSPITAL_COMMUNITY): Payer: Self-pay

## 2016-07-12 DIAGNOSIS — C679 Malignant neoplasm of bladder, unspecified: Secondary | ICD-10-CM | POA: Diagnosis not present

## 2016-07-12 DIAGNOSIS — C689 Malignant neoplasm of urinary organ, unspecified: Secondary | ICD-10-CM

## 2016-07-12 DIAGNOSIS — C772 Secondary and unspecified malignant neoplasm of intra-abdominal lymph nodes: Secondary | ICD-10-CM | POA: Insufficient documentation

## 2016-07-12 DIAGNOSIS — R918 Other nonspecific abnormal finding of lung field: Secondary | ICD-10-CM | POA: Diagnosis not present

## 2016-07-12 DIAGNOSIS — K449 Diaphragmatic hernia without obstruction or gangrene: Secondary | ICD-10-CM | POA: Insufficient documentation

## 2016-07-12 MED ORDER — IOPAMIDOL (ISOVUE-300) INJECTION 61%
INTRAVENOUS | Status: AC
  Filled 2016-07-12: qty 100

## 2016-07-12 MED ORDER — IOPAMIDOL (ISOVUE-300) INJECTION 61%
100.0000 mL | Freq: Once | INTRAVENOUS | Status: AC | PRN
  Administered 2016-07-12: 80 mL via INTRAVENOUS

## 2016-07-16 ENCOUNTER — Telehealth: Payer: Self-pay | Admitting: *Deleted

## 2016-07-16 ENCOUNTER — Telehealth: Payer: Self-pay | Admitting: Oncology

## 2016-07-16 ENCOUNTER — Ambulatory Visit (HOSPITAL_BASED_OUTPATIENT_CLINIC_OR_DEPARTMENT_OTHER): Payer: Medicare Other | Admitting: Oncology

## 2016-07-16 ENCOUNTER — Ambulatory Visit: Payer: Medicare Other

## 2016-07-16 ENCOUNTER — Ambulatory Visit (HOSPITAL_COMMUNITY)
Admission: RE | Admit: 2016-07-16 | Discharge: 2016-07-16 | Disposition: A | Discharge status: Home / Self Care | Payer: Medicare Other | Source: Ambulatory Visit | Attending: Oncology | Admitting: Oncology

## 2016-07-16 ENCOUNTER — Other Ambulatory Visit: Payer: Self-pay | Admitting: *Deleted

## 2016-07-16 ENCOUNTER — Other Ambulatory Visit (HOSPITAL_BASED_OUTPATIENT_CLINIC_OR_DEPARTMENT_OTHER): Payer: Medicare Other

## 2016-07-16 ENCOUNTER — Ambulatory Visit (HOSPITAL_BASED_OUTPATIENT_CLINIC_OR_DEPARTMENT_OTHER): Payer: Medicare Other

## 2016-07-16 VITALS — BP 104/79 | HR 71 | Temp 97.9°F | Resp 18 | Ht 70.0 in | Wt 194.8 lb

## 2016-07-16 DIAGNOSIS — C7800 Secondary malignant neoplasm of unspecified lung: Secondary | ICD-10-CM

## 2016-07-16 DIAGNOSIS — C689 Malignant neoplasm of urinary organ, unspecified: Secondary | ICD-10-CM

## 2016-07-16 DIAGNOSIS — C652 Malignant neoplasm of left renal pelvis: Secondary | ICD-10-CM

## 2016-07-16 DIAGNOSIS — Z5112 Encounter for antineoplastic immunotherapy: Secondary | ICD-10-CM

## 2016-07-16 DIAGNOSIS — D500 Iron deficiency anemia secondary to blood loss (chronic): Secondary | ICD-10-CM

## 2016-07-16 DIAGNOSIS — D649 Anemia, unspecified: Secondary | ICD-10-CM

## 2016-07-16 DIAGNOSIS — R319 Hematuria, unspecified: Secondary | ICD-10-CM | POA: Diagnosis not present

## 2016-07-16 DIAGNOSIS — Z95828 Presence of other vascular implants and grafts: Secondary | ICD-10-CM

## 2016-07-16 LAB — COMPREHENSIVE METABOLIC PANEL
ALBUMIN: 3.4 g/dL — AB (ref 3.5–5.0)
ALK PHOS: 71 U/L (ref 40–150)
ALT: 26 U/L (ref 0–55)
AST: 49 U/L — AB (ref 5–34)
Anion Gap: 8 mEq/L (ref 3–11)
BILIRUBIN TOTAL: 0.62 mg/dL (ref 0.20–1.20)
BUN: 21 mg/dL (ref 7.0–26.0)
CO2: 23 meq/L (ref 22–29)
Calcium: 9.2 mg/dL (ref 8.4–10.4)
Chloride: 105 mEq/L (ref 98–109)
Creatinine: 1.5 mg/dL — ABNORMAL HIGH (ref 0.7–1.3)
EGFR: 45 mL/min/{1.73_m2} — ABNORMAL LOW (ref >90)
Glucose: 167 mg/dl — ABNORMAL HIGH (ref 70–140)
Potassium: 4.2 mEq/L (ref 3.5–5.1)
SODIUM: 136 meq/L (ref 136–145)
TOTAL PROTEIN: 6.2 g/dL — AB (ref 6.4–8.3)

## 2016-07-16 LAB — PREPARE RBC (CROSSMATCH)

## 2016-07-16 LAB — CBC WITH DIFFERENTIAL/PLATELET
BASO%: 0.8 % (ref 0.0–2.0)
Basophils Absolute: 0 10*3/uL (ref 0.0–0.1)
EOS%: 3.1 % (ref 0.0–7.0)
Eosinophils Absolute: 0.2 10*3/uL (ref 0.0–0.5)
HCT: 22.4 % — ABNORMAL LOW (ref 38.4–49.9)
HEMOGLOBIN: 7.3 g/dL — AB (ref 13.0–17.1)
LYMPH%: 17.6 % (ref 14.0–49.0)
MCH: 30.1 pg (ref 27.2–33.4)
MCHC: 32.6 g/dL (ref 32.0–36.0)
MCV: 92.2 fL (ref 79.3–98.0)
MONO#: 0.4 10*3/uL (ref 0.1–0.9)
MONO%: 7.9 % (ref 0.0–14.0)
NEUT%: 70.6 % (ref 39.0–75.0)
NEUTROS ABS: 3.9 10*3/uL (ref 1.5–6.5)
Platelets: 189 10*3/uL (ref 140–400)
RBC: 2.43 10*6/uL — AB (ref 4.20–5.82)
RDW: 16.2 % — ABNORMAL HIGH (ref 11.0–14.6)
WBC: 5.5 10*3/uL (ref 4.0–10.3)
lymph#: 1 10*3/uL (ref 0.9–3.3)

## 2016-07-16 MED ORDER — SODIUM CHLORIDE 0.9% FLUSH
10.0000 mL | INTRAVENOUS | Status: DC | PRN
  Filled 2016-07-16: qty 10

## 2016-07-16 MED ORDER — DIPHENHYDRAMINE HCL 25 MG PO CAPS
25.0000 mg | ORAL_CAPSULE | Freq: Once | ORAL | Status: AC
  Administered 2016-07-16: 25 mg via ORAL
  Filled 2016-07-16: qty 1

## 2016-07-16 MED ORDER — HEPARIN SOD (PORK) LOCK FLUSH 100 UNIT/ML IV SOLN
500.0000 [IU] | Freq: Every day | INTRAVENOUS | Status: AC | PRN
  Administered 2016-07-16: 500 [IU]
  Filled 2016-07-16: qty 5

## 2016-07-16 MED ORDER — SODIUM CHLORIDE 0.9 % IV SOLN
250.0000 mL | Freq: Once | INTRAVENOUS | Status: AC
  Administered 2016-07-16: 250 mL via INTRAVENOUS

## 2016-07-16 MED ORDER — SODIUM CHLORIDE 0.9 % IV SOLN
Freq: Once | INTRAVENOUS | Status: AC
  Administered 2016-07-16: 09:00:00 via INTRAVENOUS

## 2016-07-16 MED ORDER — SODIUM CHLORIDE 0.9 % IV SOLN
200.0000 mg | Freq: Once | INTRAVENOUS | Status: AC
  Administered 2016-07-16: 200 mg via INTRAVENOUS
  Filled 2016-07-16: qty 8

## 2016-07-16 MED ORDER — HEPARIN SOD (PORK) LOCK FLUSH 100 UNIT/ML IV SOLN
500.0000 [IU] | Freq: Once | INTRAVENOUS | Status: DC | PRN
  Filled 2016-07-16: qty 5

## 2016-07-16 MED ORDER — SODIUM CHLORIDE 0.9% FLUSH
10.0000 mL | INTRAVENOUS | Status: AC | PRN
  Administered 2016-07-16: 10 mL

## 2016-07-16 MED ORDER — SODIUM CHLORIDE 0.9 % IJ SOLN
10.0000 mL | INTRAMUSCULAR | Status: DC | PRN
  Administered 2016-07-16: 10 mL via INTRAVENOUS
  Filled 2016-07-16: qty 10

## 2016-07-16 MED ORDER — ACETAMINOPHEN 325 MG PO TABS
650.0000 mg | ORAL_TABLET | Freq: Once | ORAL | Status: AC
  Administered 2016-07-16: 650 mg via ORAL
  Filled 2016-07-16: qty 2

## 2016-07-16 NOTE — Progress Notes (Signed)
Hematology and Oncology Follow Up Visit  [name] [name] [id number] 02-Dec-1945 71 y.o. 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 He presented with 4.9 cm mass of the upper pole of the left kidney with documented pulmonary metastasis. Neurological confirmation done on 04/15/2015 with a biopsy showed urothelial carcinoma.   Prior Therapy:   Status post biopsy of the left renal mass done on 04/15/2015. He is status post Port-A-Cath insertion on 05/21/2015. Systemic chemotherapy in the form of cisplatin and gemcitabine started on 05/27/2015. He is status post 6 cycles completed on 09/16/2015. CT scan on 01/06/2016 showed progression of disease.  Current therapy: Pembrolizumab 200 mg every 3 weeks cycle 1 on 02/13/2016. He is here for cycle 8 of therapy.  Interim History:  [name] [name] presents today for a follow-up visit. Since the last visit, he reports no major changes in his health. He continues to have issues with fatigue and tiredness. His hematuria is improving slowly. He is no longer reporting any clots and his urine flow is adequate. He denied any chest pain, palpitation or dyspnea on exertion. He continues to attend some activities of daily living.   He tolerated the last cycle of Pembrolizumab without any denied any nausea, abdominal pain or respiratory distress. He denied any wheezing or hemoptysis. He denied any back pain or shoulder pain. He denied any pelvic pain. His appetite remained excellent without any major changes. He has gained 1 pound since the last visit.   He does not report any headaches, blurry vision, syncope or seizures. He does not report any fevers, chills, or sweats. He does not report any chest pain, palpitation, orthopnea or leg edema. He does not report any wheezing or hemoptysis. He does not report any  vomiting, abdominal  pain does report occasional dyspepsia. He does not report any frequency, urgency or hesitancy. He does not report any skeletal complaints. Remaining review of systems unremarkable.   Medications: I have reviewed the patient's current medications.  Current Outpatient Prescriptions  Medication Sig Dispense Refill  . acetaminophen (TYLENOL) 325 MG tablet Take 650 mg by mouth every 6 (six) hours as needed for moderate pain or headache.     [name] atorvastatin (LIPITOR) 40 MG tablet Take 1 tablet (40 mg total) by mouth daily. (Patient taking differently: Take 40 mg by mouth every morning. ) 90 tablet 3  . benzonatate (TESSALON) 200 MG capsule Take 1 capsule (200 mg total) by mouth 3 (three) times daily as needed for cough. 20 capsule 0  . Cholecalciferol (VITAMIN D) 1000 UNITS capsule Take 1,000 Units by mouth daily.      [name] docusate sodium (COLACE) 100 MG capsule Take 100 mg by mouth 2 (two) times daily as needed (constipation).    . famotidine (PEPCID) 20 MG tablet Take 1 tablet (20 mg total) by mouth daily. 30 tablet 2  . levothyroxine (SYNTHROID) 25 MCG tablet Take 1 tablet (25 mcg total) by mouth daily before breakfast. 30 tablet 1  . lidocaine-prilocaine (EMLA) cream Apply to port-a-cath 1-2 hours prior to acces. Cover with saran wrap. 30 g 1  . lisinopril (PRINIVIL,ZESTRIL) 5 MG tablet     . metoprolol tartrate (LOPRESSOR) 25 MG tablet Take 0.5 tablets (12.5 mg total) by mouth 2 (two) times daily. 90 tablet 3  . nitroGLYCERIN (NITROSTAT) 0.4 MG SL tablet Place 1 tablet (0.4 mg total) under the tongue every 5 (  five) minutes as needed. May repeat up to 3 doses. 100 tablet 3  . polycarbophil (FIBERCON) 625 MG tablet Take 625 mg by mouth 2 (two) times daily.     [name] PRESCRIPTION MEDICATION Inject 160 mg into the muscle daily. Gentamycin given for 3 days at [facility]    . sildenafil (VIAGRA) 50 MG tablet Take 50 mg by mouth as needed for erectile dysfunction.      No current facility-administered  medications for this visit.      Allergies:  Allergies  Allergen Reactions  . Hydrocodone Other (See Comments)    Became too sedated    Past Medical History, Surgical history, Social history, and Family History were reviewed and updated.   Physical Exam: Blood pressure 104/79, pulse 71, temperature 97.9 F (36.6 C), temperature source Oral, resp. rate 18, height 5\' 10"  (1.778 m), weight 194 lb 12.8 oz (88.4 kg), SpO2 100 %. ECOG: 0 General appearance: Well-appearing gentleman appeared without distress. Head: Normocephalic, without obvious abnormality no rebound or guarding. Neck: no adenopathy no thyroid masses. Lymph nodes: Cervical, supraclavicular, and axillary nodes normal. Heart:regular rate and rhythm, S1, S2 normal, no murmur, click, rub or gallop Lung:chest clear, no wheezing, rales, normal symmetric air entry Abdomin: soft, non-tender, without masses or organomegaly no shifting dullness or ascites. EXT:no erythema, induration, or nodules Skin: No rashes or lesions.  Lab Results: Lab Results  Component Value Date   WBC 5.5 07/16/2016   HGB 7.3 (L) 07/16/2016   HCT 22.4 (L) 07/16/2016   MCV 92.2 07/16/2016   PLT 189 07/16/2016     Chemistry      Component Value Date/Time   NA 136 06/25/2016 0926   K 4.1 06/25/2016 0926   CL 104 06/19/2016 0537   CO2 25 06/25/2016 0926   BUN 19.8 06/25/2016 0926   CREATININE 1.5 (H) 06/25/2016 0926      Component Value Date/Time   CALCIUM 8.7 06/25/2016 0926   ALKPHOS 73 06/25/2016 0926   AST 30 06/25/2016 0926   ALT 19 06/25/2016 0926   BILITOT 0.61 06/25/2016 0926      IMPRESSION: CT CHEST IMPRESSION  1. Interval enlargement of a right lower lobe lung nodule, consistent with progressive pulmonary metastasis. Other lung nodules are similar. 2. No thoracic adenopathy. 3.  Tiny hiatal hernia.  CT ABDOMEN AND PELVIS IMPRESSION  1. Similar left renal pelvic transitional cell carcinoma. Similar extension in  the proximal left ureter. 2. No evidence of metachronous disease. 3. Progressive retroperitoneal nodal metastasis.      71 year old gentleman with the following issues:  1. 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 He did have a pathological confirmation done on 04/15/2015 with the specimen showed urothelial carcinoma.  He is status post 6 cycles of therapy completed in March 2017. CT scan on 04/11/2017showed continuous response to therapy. He had further decrease in his bilateral pulmonary nodules as well as decrease in the left upper pole renal lesion.  Repeat CT scan on 01/06/2016 showed mild progression of disease especially in his primary tumor. His pulmonary nodules have remained stable.   He is currently receiving Pembrolizumab and continues to tolerated very well.   CT scan on January 15 was personally reviewed and showed minimal progression. No new nodules are noted at this time and most of the changes are very minimal  at this time. Most of his lymphadenopathy is stable.  Risks and benefits of continuing this treatment were discussed and he is agreeable to continue.  2. Nausea and GI complication prophylaxis: Antiemetics regimen is available to him as needed. No issues noted at this time.  3. IV access: Port-A-Cath used for treatment without any major changes.  4. Hematuria: Related to his renal pelvis tumor. He continues to have that intermittently but have not stopped. He is under evaluation for possible angiography and embolization under the care of Dr. Kathlene Cote. This is scheduled next week.  5. Anemia: He is symptomatic at this time related to blood loss. He will receive 2 units of packed red cell transfusion at this time. We'll continue to check his counts weekly and transfuse as needed.  6. Autoimmune  surveillance: He did develop hypothyroidism and currently on low-dose Synthroid. His TSH will continue to be checked periodically while on this treatment.  7. Follow-up: Will be in 3 weeks for the next cycle of therapy.  [facility], MD 1/19/20189:12 AM

## 2016-07-16 NOTE — Telephone Encounter (Signed)
Per 1/19 LOS I have scheduled appts. Notified the scheduler no avilable on 1/26 for blood, pt needs sickle cell. Other appts in and gave calendar

## 2016-07-16 NOTE — Progress Notes (Signed)
Per Dr. [name], ok to treat with Hbg of 7.3. He will be receiving a blood transfusion on a day soon.

## 2016-07-16 NOTE — Patient Instructions (Signed)

## 2016-07-16 NOTE — Patient Instructions (Signed)
[facility] Discharge Instructions for Patients Receiving Chemotherapy  Today you received the following chemotherapy agent: Keytruda.  To help prevent nausea and vomiting after your treatment, we encourage you to take your nausea medication as prescribed.   If you develop nausea and vomiting that is not controlled by your nausea medication, call the clinic.   BELOW ARE SYMPTOMS THAT SHOULD BE REPORTED IMMEDIATELY:  *FEVER GREATER THAN 100.5 F  *CHILLS WITH OR WITHOUT FEVER  NAUSEA AND VOMITING THAT IS NOT CONTROLLED WITH YOUR NAUSEA MEDICATION  *UNUSUAL SHORTNESS OF BREATH  *UNUSUAL BRUISING OR BLEEDING  TENDERNESS IN MOUTH AND THROAT WITH OR WITHOUT PRESENCE OF ULCERS  *URINARY PROBLEMS  *BOWEL PROBLEMS  UNUSUAL RASH Items with * indicate a potential emergency and should be followed up as soon as possible.  Feel free to call the clinic you have any questions or concerns. The clinic phone number is [phone].  Please show the [facility] at check-in to the Emergency Department and triage nurse.   Blood Transfusion , Adult A blood transfusion is a procedure in which you receive donated blood, including plasma, platelets, and red blood cells, through an IV tube. You may need a blood transfusion because of illness, surgery, or injury. The blood may come from a donor. You may also be able to donate blood for yourself (autologous blood donation) before a surgery if you know that you might require a blood transfusion. The blood given in a transfusion is made up of different types of cells. You may receive:  Red blood cells. These carry oxygen to the cells in the body.  White blood cells. These help you fight infections.  Platelets. These help your blood to clot.  Plasma. This is the liquid part of your blood and it helps with fluid imbalances. If you have hemophilia or another clotting disorder, you may also receive other types of blood products. Tell  a health care provider about:  Any allergies you have.  All medicines you are taking, including vitamins, herbs, eye drops, creams, and over-the-counter medicines.  Any problems you or family members have had with anesthetic medicines.  Any blood disorders you have.  Any surgeries you have had.  Any medical conditions you have, including any recent fever or cold symptoms.  Whether you are pregnant or may be pregnant.  Any previous reactions you have had during a blood transfusion. What are the risks? Generally, this is a safe procedure. However, problems may occur, including:  Having an allergic reaction to something in the donated blood. Hives and itching may be symptoms of this type of reaction.  Fever. This may be a reaction to the white blood cells in the transfused blood. Nausea or chest pain may accompany a fever.  Iron overload. This can happen from having many transfusions.  Transfusion-related acute lung injury (TRALI). This is a rare reaction that causes lung damage. The cause is not known.TRALI can occur within hours of a transfusion or several days later.  Sudden (acute) or delayed hemolytic reactions. This happens if your blood does not match the cells in your transfusion. Your body's defense system (immune system) may try to attack the new cells. This complication is rare. The symptoms include fever, chills, nausea, and low back pain or chest pain.  Infection or disease transmission. This is rare. What happens before the procedure?  You will have a blood test to determine your blood type. This is necessary to know what kind of blood your  body will accept and to match it to the donor blood.  If you are going to have a planned surgery, you may be able to do an autologous blood donation. This may be done in case you need to have a transfusion.  If you have had an allergic reaction to a transfusion in the past, you may be given medicine to help prevent a reaction. This  medicine may be given to you by mouth or through an IV tube.  You will have your temperature, blood pressure, and pulse monitored before the transfusion.  Follow instructions from your health care provider about eating and drinking restrictions.  Ask your health care provider about:  Changing or stopping your regular medicines. This is especially important if you are taking diabetes medicines or blood thinners.  Taking medicines such as aspirin and ibuprofen. These medicines can thin your blood. Do not take these medicines before your procedure if your health care provider instructs you not to. What happens during the procedure?  An IV tube will be inserted into one of your veins.  The bag of donated blood will be attached to your IV tube. The blood will then enter through your vein.  Your temperature, blood pressure, and pulse will be monitored regularly during the transfusion. This monitoring is done to detect early signs of a transfusion reaction.  If you have any signs or symptoms of a reaction, your transfusion will be stopped and you may be given medicine.  When the transfusion is complete, your IV tube will be removed.  Pressure may be applied to the IV site for a few minutes.  A bandage (dressing) will be applied. The procedure may vary among health care providers and hospitals. What happens after the procedure?  Your temperature, blood pressure, heart rate, breathing rate, and blood oxygen level will be monitored often.  Your blood may be tested to see how you are responding to the transfusion.  You may be warmed with fluids or blankets to maintain a normal body temperature. Summary  A blood transfusion is a procedure in which you receive donated blood, including plasma, platelets, and red blood cells, through an IV tube.  Your temperature, blood pressure, and pulse will be monitored before, during, and after the transfusion.  Your blood may be tested after the  transfusion to see how your body has responded. This information is not intended to replace advice given to you by your health care provider. Make sure you discuss any questions you have with your health care provider. Document Released: 06/11/2000 Document Revised: 03/11/2016 Document Reviewed: 03/11/2016 Elsevier Interactive Patient Education  2017 [employer].

## 2016-07-16 NOTE — Telephone Encounter (Signed)
Appointments scheduled per 11/9 LOS. Patient given AVS report and calendars with future scheduled appointments. °

## 2016-07-16 NOTE — Progress Notes (Signed)
Diagnosis Association: Urothelial cancer ([place]) (C68.9)  Provider: Dr. [name]   Procedure: Pt received 2 units of PRBCs .  Pt tolerated procedure well.  Post procedure: Pt alert,oriented and ambulatory at discharge. D/C instructions given with verbal understanding.

## 2016-07-19 ENCOUNTER — Other Ambulatory Visit: Payer: Self-pay | Admitting: General Surgery

## 2016-07-19 LAB — TYPE AND SCREEN
ABO/RH(D): B POS
Antibody Screen: NEGATIVE
Unit division: 0
Unit division: 0

## 2016-07-20 ENCOUNTER — Other Ambulatory Visit: Payer: Self-pay | Admitting: Interventional Radiology

## 2016-07-20 ENCOUNTER — Ambulatory Visit (HOSPITAL_COMMUNITY)
Admission: RE | Admit: 2016-07-20 | Discharge: 2016-07-20 | Disposition: A | Discharge status: Home / Self Care | Payer: Medicare Other | Source: Ambulatory Visit | Attending: Interventional Radiology | Admitting: Interventional Radiology

## 2016-07-20 ENCOUNTER — Encounter (HOSPITAL_COMMUNITY): Payer: Self-pay

## 2016-07-20 DIAGNOSIS — I2510 Atherosclerotic heart disease of native coronary artery without angina pectoris: Secondary | ICD-10-CM | POA: Insufficient documentation

## 2016-07-20 DIAGNOSIS — C679 Malignant neoplasm of bladder, unspecified: Secondary | ICD-10-CM | POA: Diagnosis not present

## 2016-07-20 DIAGNOSIS — Z951 Presence of aortocoronary bypass graft: Secondary | ICD-10-CM | POA: Insufficient documentation

## 2016-07-20 DIAGNOSIS — N029 Recurrent and persistent hematuria with unspecified morphologic changes: Secondary | ICD-10-CM | POA: Diagnosis not present

## 2016-07-20 DIAGNOSIS — C642 Malignant neoplasm of left kidney, except renal pelvis: Secondary | ICD-10-CM

## 2016-07-20 DIAGNOSIS — J449 Chronic obstructive pulmonary disease, unspecified: Secondary | ICD-10-CM | POA: Insufficient documentation

## 2016-07-20 DIAGNOSIS — N529 Male erectile dysfunction, unspecified: Secondary | ICD-10-CM | POA: Diagnosis not present

## 2016-07-20 DIAGNOSIS — R319 Hematuria, unspecified: Secondary | ICD-10-CM | POA: Diagnosis not present

## 2016-07-20 DIAGNOSIS — K219 Gastro-esophageal reflux disease without esophagitis: Secondary | ICD-10-CM | POA: Diagnosis not present

## 2016-07-20 DIAGNOSIS — Z87891 Personal history of nicotine dependence: Secondary | ICD-10-CM | POA: Diagnosis not present

## 2016-07-20 DIAGNOSIS — I10 Essential (primary) hypertension: Secondary | ICD-10-CM | POA: Diagnosis not present

## 2016-07-20 DIAGNOSIS — I4891 Unspecified atrial fibrillation: Secondary | ICD-10-CM | POA: Diagnosis not present

## 2016-07-20 DIAGNOSIS — C649 Malignant neoplasm of unspecified kidney, except renal pelvis: Secondary | ICD-10-CM | POA: Diagnosis not present

## 2016-07-20 DIAGNOSIS — C652 Malignant neoplasm of left renal pelvis: Secondary | ICD-10-CM | POA: Diagnosis not present

## 2016-07-20 HISTORY — PX: IR GENERIC HISTORICAL: IMG1180011

## 2016-07-20 LAB — BASIC METABOLIC PANEL
Anion gap: 6 (ref 5–15)
BUN: 24 mg/dL — ABNORMAL HIGH (ref 6–20)
CHLORIDE: 103 mmol/L (ref 101–111)
CO2: 27 mmol/L (ref 22–32)
Calcium: 8.8 mg/dL — ABNORMAL LOW (ref 8.9–10.3)
Creatinine, Ser: 1.54 mg/dL — ABNORMAL HIGH (ref 0.61–1.24)
GFR calc Af Amer: 50 mL/min — ABNORMAL LOW (ref >60)
GFR calc non Af Amer: 43 mL/min — ABNORMAL LOW (ref >60)
Glucose, Bld: 108 mg/dL — ABNORMAL HIGH (ref 65–99)
POTASSIUM: 4.1 mmol/L (ref 3.5–5.1)
SODIUM: 136 mmol/L (ref 135–145)

## 2016-07-20 LAB — CBC
HEMATOCRIT: 28.8 % — AB (ref 39.0–52.0)
HEMOGLOBIN: 9.3 g/dL — AB (ref 13.0–17.0)
MCH: 28.8 pg (ref 26.0–34.0)
MCHC: 32.3 g/dL (ref 30.0–36.0)
MCV: 89.2 fL (ref 78.0–100.0)
Platelets: 160 10*3/uL (ref 150–400)
RBC: 3.23 MIL/uL — AB (ref 4.22–5.81)
RDW: 16.2 % — ABNORMAL HIGH (ref 11.5–15.5)
WBC: 5.6 10*3/uL (ref 4.0–10.5)

## 2016-07-20 LAB — PROTIME-INR
INR: 1.02
Prothrombin Time: 13.4 seconds (ref 11.4–15.2)

## 2016-07-20 LAB — APTT: aPTT: 31 seconds (ref 24–36)

## 2016-07-20 MED ORDER — MIDAZOLAM HCL 2 MG/2ML IJ SOLN
INTRAMUSCULAR | Status: AC
  Filled 2016-07-20: qty 6

## 2016-07-20 MED ORDER — SODIUM CHLORIDE 0.9 % IV SOLN
INTRAVENOUS | Status: DC
Start: 2016-07-20 — End: 2016-07-21
  Administered 2016-07-20: 09:00:00 via INTRAVENOUS

## 2016-07-20 MED ORDER — LIDOCAINE HCL 1 % IJ SOLN
INTRAMUSCULAR | Status: AC
  Filled 2016-07-20: qty 20

## 2016-07-20 MED ORDER — MIDAZOLAM HCL 2 MG/2ML IJ SOLN
INTRAMUSCULAR | Status: AC | PRN
Start: 2016-07-20 — End: 2016-07-20
  Administered 2016-07-20 (×3): 1 mg via INTRAVENOUS

## 2016-07-20 MED ORDER — IODIXANOL 320 MG/ML IV SOLN
150.0000 mL | Freq: Once | INTRAVENOUS | Status: AC | PRN
  Administered 2016-07-20: 40 mL via INTRAVENOUS

## 2016-07-20 MED ORDER — HEPARIN SOD (PORK) LOCK FLUSH 100 UNIT/ML IV SOLN
500.0000 [IU] | INTRAVENOUS | Status: AC | PRN
  Administered 2016-07-20: 500 [IU]
  Filled 2016-07-20: qty 5

## 2016-07-20 MED ORDER — CEFAZOLIN SODIUM-DEXTROSE 2-4 GM/100ML-% IV SOLN
2.0000 g | INTRAVENOUS | Status: AC
  Administered 2016-07-20: 2 g via INTRAVENOUS
  Filled 2016-07-20: qty 100

## 2016-07-20 MED ORDER — LIDOCAINE HCL 1 % IJ SOLN
INTRAMUSCULAR | Status: AC | PRN
  Administered 2016-07-20: 10 mL

## 2016-07-20 MED ORDER — FENTANYL CITRATE (PF) 100 MCG/2ML IJ SOLN
INTRAMUSCULAR | Status: AC | PRN
  Administered 2016-07-20: 50 ug via INTRAVENOUS

## 2016-07-20 MED ORDER — SODIUM CHLORIDE 0.9 % IV SOLN: INTRAVENOUS | Status: DC

## 2016-07-20 MED ORDER — FENTANYL CITRATE (PF) 100 MCG/2ML IJ SOLN
INTRAMUSCULAR | Status: AC
  Filled 2016-07-20: qty 4

## 2016-07-20 NOTE — Sedation Documentation (Signed)
5 Fr sheath removed from R femoral artery by Dr. Kathlene Cote, Hemostasis achieved using Exoseal closure device. Groin unremarkable, 4+RDP.

## 2016-07-20 NOTE — Discharge Instructions (Signed)
Femoral Site Care °Introduction °Refer to this sheet in the next few weeks. These instructions provide you with information about caring for yourself after your procedure. Your health care provider may also give you more specific instructions. Your treatment has been planned according to current medical practices, but problems sometimes occur. Call your health care provider if you have any problems or questions after your procedure. °What can I expect after the procedure? °After your procedure, it is typical to have the following: °· Bruising at the site that usually fades within 1-2 weeks. °· Blood collecting in the tissue (hematoma) that may be painful to the touch. It should usually decrease in size and tenderness within 1-2 weeks. °Follow these instructions at home: °· Take medicines only as directed by your health care provider. °· You may shower 24-48 hours after the procedure or as directed by your health care provider. Remove the bandage (dressing) and gently wash the site with plain soap and water. Pat the area dry with a clean towel. Do not rub the site, because this may cause bleeding. °· Do not take baths, swim, or use a hot tub until your health care provider approves. °· Check your insertion site every day for redness, swelling, or drainage. °· Do not apply powder or lotion to the site. °· Limit use of stairs to twice a day for the first 2-3 days or as directed by your health care provider. °· Do not squat for the first 2-3 days or as directed by your health care provider. °· Do not lift over 10 lb (4.5 kg) for 5 days after your procedure or as directed by your health care provider. °· Ask your health care provider when it is okay to: °¨ Return to work or school. °¨ Resume usual physical activities or sports. °¨ Resume sexual activity. °· Do not drive home if you are discharged the same day as the procedure. Have someone else drive you. °· You may drive 24 hours after the procedure unless otherwise  instructed by your health care provider. °· Do not operate machinery or power tools for 24 hours after the procedure or as directed by your health care provider. °· If your procedure was done as an outpatient procedure, which means that you went home the same day as your procedure, a responsible adult should be with you for the first 24 hours after you arrive home. °· Keep all follow-up visits as directed by your health care provider. This is important. °Contact a health care provider if: °· You have a fever. °· You have chills. °· You have increased bleeding from the site. Hold pressure on the site. °Get help right away if: °· You have unusual pain at the site. °· You have redness, warmth, or swelling at the site. °· You have drainage (other than a small amount of blood on the dressing) from the site. °· The site is bleeding, and the bleeding does not stop after 30 minutes of holding steady pressure on the site. °· Your leg or foot becomes pale, cool, tingly, or numb. °This information is not intended to replace advice given to you by your health care provider. Make sure you discuss any questions you have with your health care provider. °Document Released: 02/15/2014 Document Revised: 11/20/2015 Document Reviewed: 01/01/2014 °© 2017 Elsevier °Moderate Conscious Sedation, Adult °Sedation is the use of medicines to promote relaxation and relieve discomfort and anxiety. Moderate conscious sedation is a type of sedation. Under moderate conscious sedation, you are less   alert than normal, but you are still able to respond to instructions, touch, or both. °Moderate conscious sedation is used during short medical and dental procedures. It is milder than deep sedation, which is a type of sedation under which you cannot be easily woken up. It is also milder than general anesthesia, which is the use of medicines to make you unconscious. Moderate conscious sedation allows you to return to your regular activities sooner. °Tell  a health care provider about: °· Any allergies you have. °· All medicines you are taking, including vitamins, herbs, eye drops, creams, and over-the-counter medicines. °· Use of steroids (by mouth or creams). °· Any problems you or family members have had with sedatives and anesthetic medicines. °· Any blood disorders you have. °· Any surgeries you have had. °· Any medical conditions you have, such as sleep apnea. °· Whether you are pregnant or may be pregnant. °· Any use of cigarettes, alcohol, marijuana, or street drugs. °What are the risks? °Generally, this is a safe procedure. However, problems may occur, including: °· Getting too much medicine (oversedation). °· Nausea. °· Allergic reaction to medicines. °· Trouble breathing. If this happens, a breathing tube may be used to help with breathing. It will be removed when you are awake and breathing on your own. °· Heart trouble. °· Lung trouble. °What happens before the procedure? °Staying hydrated  °Follow instructions from your health care provider about hydration, which may include: °· Up to 2 hours before the procedure - you may continue to drink clear liquids, such as water, clear fruit juice, black coffee, and plain tea. °Eating and drinking restrictions  °Follow instructions from your health care provider about eating and drinking, which may include: °· 8 hours before the procedure - stop eating heavy meals or foods such as meat, fried foods, or fatty foods. °· 6 hours before the procedure - stop eating light meals or foods, such as toast or cereal. °· 6 hours before the procedure - stop drinking milk or drinks that contain milk. °· 2 hours before the procedure - stop drinking clear liquids. °Medicine  °Ask your health care provider about: °· Changing or stopping your regular medicines. This is especially important if you are taking diabetes medicines or blood thinners. °· Taking medicines such as aspirin and ibuprofen. These medicines can thin your blood.  Do not take these medicines before your procedure if your health care provider instructs you not to. °Tests and exams °· You will have a physical exam. °· You may have blood tests done to show: °¨ How well your kidneys and liver are working. °¨ How well your blood can clot. °General instructions °· Plan to have someone take you home from the hospital or clinic. °· If you will be going home right after the procedure, plan to have someone with you for 24 hours. °What happens during the procedure? °· An IV tube will be inserted into one of your veins. °· Medicine to help you relax (sedative) will be given through the IV tube. °· The medical or dental procedure will be performed. °What happens after the procedure? °· Your blood pressure, heart rate, breathing rate, and blood oxygen level will be monitored often until the medicines you were given have worn off. °· Do not drive for 24 hours. °This information is not intended to replace advice given to you by your health care provider. Make sure you discuss any questions you have with your health care provider. °Document Released: 03/09/2001 Document Revised:   11/18/2015 Document Reviewed: 10/04/2015 °Elsevier Interactive Patient Education © 2017 Elsevier Inc. ° °

## 2016-07-20 NOTE — Sedation Documentation (Signed)
Gauze/tegaderm bandage applied to R femoral artery puncture site. Drsg, DCI, 4+RDP.

## 2016-07-20 NOTE — Procedures (Signed)
Interventional Radiology Procedure Note  Procedure:  Selective left renal arteriography  Complications: None  Estimated Blood Loss: 10 mL  Findings:  No discrete arterial supply to tumor identified by left renal arteriography.  Tumor creates hypovascular void in nephrographic phase of angiography.  No vessels to safely embolize without sacrificing normal renal parenchyma. Exoseal closure. 3 hour recovery then discharge.  Venetia Night. Kathlene Cote, M.D Pager:  [phone]

## 2016-07-20 NOTE — H&P (Signed)
Referring Physician(s): Budzyn,B  Supervising Physician: Aletta Edouard  Patient Status:  [facility]  Chief Complaint:  Hematuria/metastatic urothelial carcinoma  Subjective: Patient familiar to IR service from prior left renal mass biopsy in October 2016, right Port-A-Cath placement in November 2016 and most recently consultation with Dr. Kathlene Cote on 07/06/16 to discuss treatment options for persistent gross hematuria secondary to left renal urothelial carcinoma.Following consultation it was recommended the patient undergo selective renal arteriography with possible embolization to hopefully diminish degree of hematuria. He presents today for the procedure.He currently denies fever, headache, chest pain, dyspnea, cough, abdominal/back pain, nausea, vomiting. He continues to have hematuria but notices less clots than previous. Past Medical History:  Diagnosis Date  . Atrial fibrillation (HCC)    Breif, post-op CABG  . bladder ca dx'd 03/2015  . CAD (coronary artery disease)    Exertional chest pain prompted LHC 10/11 showing EF 55%, mild inferior hypokinesis, 90% prox RCA, 70% mid RCA, 80% distal RCA, 70% ostial PDA, 70% mPLV, 90% mid OM1 (large), 90-95% prox LAD. Pt had CABG with LIMA-LAD, SVG-OMG1, seq SVG-PDA/PLV  . COPD (chronic obstructive pulmonary disease) ([place])    Quit smoking 10/11  . ED (erectile dysfunction) of organic origin   . GERD (gastroesophageal reflux disease)   . Hypertension    Past Surgical History:  Procedure Laterality Date  . CORONARY ARTERY BYPASS GRAFT     LIMA-LAD, SVG-OM1, seq SVG-PDA/PLV  . CYSTOSCOPY WITH RETROGRADE PYELOGRAM, URETEROSCOPY AND STENT PLACEMENT Left 04/08/2015   Procedure: CYSTOSCOPY WITH LEFT  RETROGRADE PYELOGRAM, BALLOON DILATION LEFT URETER, LEFT URETEROSCOPY,LEFT STENT PLACEMENT;  Surgeon: Nickie Retort, MD;  Location: [facility];  Service: Urology;  Laterality: Left;  . IR GENERIC HISTORICAL  07/06/2016   IR RADIOLOGIST EVAL & MGMT  07/06/2016 Aletta Edouard, MD [facility] INTERV RAD     Allergies: Hydrocodone  Medications: Prior to Admission medications   Medication Sig Start Date End Date Taking? Authorizing Provider  Cholecalciferol (VITAMIN D) 1000 UNITS capsule Take 1,000 Units by mouth daily.     Yes Historical Provider, MD  diphenhydrAMINE (BENADRYL) 25 MG tablet Take 25 mg by mouth every 6 (six) hours as needed.   Yes Historical Provider, MD  docusate sodium (COLACE) 100 MG capsule Take 100 mg by mouth 2 (two) times daily as needed (constipation).   Yes Historical Provider, MD  famotidine (PEPCID) 20 MG tablet Take 1 tablet (20 mg total) by mouth daily. 06/19/16  Yes Eugenie Filler, MD  levothyroxine (SYNTHROID) 25 MCG tablet Take 1 tablet (25 mcg total) by mouth daily before breakfast. 07/02/16  Yes Wyatt Portela, MD  lidocaine-prilocaine (EMLA) cream Apply to port-a-cath 1-2 hours prior to acces. Cover with saran wrap. 05/16/15  Yes Wyatt Portela, MD  metoprolol tartrate (LOPRESSOR) 25 MG tablet Take 0.5 tablets (12.5 mg total) by mouth 2 (two) times daily. 05/11/16  Yes Larey Dresser, MD  polycarbophil (FIBERCON) 625 MG tablet Take 625 mg by mouth 2 (two) times daily.    Yes Historical Provider, MD  acetaminophen (TYLENOL) 325 MG tablet Take 650 mg by mouth every 6 (six) hours as needed for moderate pain or headache.     Historical Provider, MD  atorvastatin (LIPITOR) 40 MG tablet Take 1 tablet (40 mg total) by mouth daily. Patient taking differently: Take 40 mg by mouth every morning.  05/11/16   Larey Dresser, MD  benzonatate (TESSALON) 200 MG capsule Take 1 capsule (200 mg total) by mouth 3 (three)  times daily as needed for cough. 05/28/16   Wyatt Portela, MD  lisinopril (PRINIVIL,ZESTRIL) 5 MG tablet  05/11/16   Historical Provider, MD  nitroGLYCERIN (NITROSTAT) 0.4 MG SL tablet Place 1 tablet (0.4 mg total) under the tongue every 5 (five) minutes as needed. May repeat up to 3 doses. 03/07/15   Larey Dresser, MD  PRESCRIPTION MEDICATION Inject 160 mg into the muscle daily. Gentamycin given for 3 days at [facility]    Historical Provider, MD  sildenafil (VIAGRA) 50 MG tablet Take 50 mg by mouth as needed for erectile dysfunction.     Historical Provider, MD     Vital Signs: BP 114/74 (BP Location: Right Arm)   Pulse 67   Temp 97.9 F (36.6 C) (Oral)   Resp 18   SpO2 98%   Physical Exam  Awake, alert. Chest clear to auscultation bilaterally. Clean, intact right chest wall Port-A-Cath. Heart with regular rate and rhythm. Abdomen soft, positive bowel sounds, nontender.Lower extremities with no edema, intact distal pulses. Imaging: No results found.  Labs:  CBC:  Recent Labs  07/02/16 0923 07/09/16 1026 07/16/16 0828 07/20/16 0843  WBC 7.2 6.4 5.5 5.6  HGB 9.8* 8.2* 7.3* 9.3*  HCT 29.8* 24.4* 22.4* 28.8*  PLT 181 203 189 160    COAGS:  Recent Labs  06/18/16 0540  INR 1.04    BMP:  Recent Labs  06/17/16 1303 06/18/16 0540 06/19/16 0537 06/25/16 0926 07/16/16 0828  NA 136 135 138 136 136  K 4.0 4.1 3.9 4.1 4.2  CL 103 103 104  --   --   CO2 25 27 28 25 23   GLUCOSE 101* 102* 98 124 167*  BUN 23* 20 20 19.8 21.0  CALCIUM 8.8* 8.6* 8.8* 8.7 9.2  CREATININE 1.53* 1.31* 1.40* 1.5* 1.5*  GFRNONAA 44* 53* 49*  --   --   GFRAA 51* >60 56*  --   --     LIVER FUNCTION TESTS:  Recent Labs  05/07/16 1122 05/28/16 0826 06/25/16 0926 07/16/16 0828  BILITOT 0.97 0.98 0.61 0.62  AST 28 43* 30 49*  ALT 25 26 19 26   ALKPHOS 92 85 73 71  PROT 6.7 6.9 6.0* 6.2*  ALBUMIN 3.5 3.1* 3.2* 3.4*    Assessment and Plan: Patient with history of metastatic urothelial carcinoma initially diagnosed in 2016 and now with persistent hematuria secondary to known left renal urothelial carcinoma.Seen recently in consultation by Dr. Kathlene Cote and deemed an appropriate candidate for selective renal arteriography with possible embolization of tumor.He presents today for the  procedure.Risks and benefits discussed with the patient/wife including, but not limited to bleeding, infection, inability to embolize tumor,vascular injury,nontarget embolization or contrast induced renal failure.All of the patient's questions were answered, patient is agreeable to proceed.Consent signed and in chart.Labs pending.    Electronically Signed: D. Rowe Robert 07/20/2016, 8:55 AM   I spent a total of 30 minutes at the the patient's bedside AND on the patient's hospital floor or unit, greater than 50% of which was counseling/coordinating care for selective left renal arteriography with possible embolization

## 2016-07-22 ENCOUNTER — Telehealth: Payer: Self-pay | Admitting: Oncology

## 2016-07-22 NOTE — Telephone Encounter (Signed)
Called patient to inform him of next scheduled appointment. LVM for the patient and mailed out new schedule.

## 2016-07-23 ENCOUNTER — Inpatient Hospital Stay (HOSPITAL_COMMUNITY): Admission: RE | Admit: 2016-07-23 | Payer: Medicare Other | Source: Ambulatory Visit

## 2016-07-23 ENCOUNTER — Other Ambulatory Visit: Payer: Medicare Other

## 2016-07-23 ENCOUNTER — Other Ambulatory Visit (HOSPITAL_BASED_OUTPATIENT_CLINIC_OR_DEPARTMENT_OTHER): Payer: Medicare Other

## 2016-07-23 DIAGNOSIS — D649 Anemia, unspecified: Secondary | ICD-10-CM

## 2016-07-23 DIAGNOSIS — C7800 Secondary malignant neoplasm of unspecified lung: Secondary | ICD-10-CM | POA: Diagnosis not present

## 2016-07-23 DIAGNOSIS — C652 Malignant neoplasm of left renal pelvis: Secondary | ICD-10-CM

## 2016-07-23 DIAGNOSIS — C689 Malignant neoplasm of urinary organ, unspecified: Secondary | ICD-10-CM

## 2016-07-23 LAB — CBC WITH DIFFERENTIAL/PLATELET
BASO%: 0.7 % (ref 0.0–2.0)
Basophils Absolute: 0 10*3/uL (ref 0.0–0.1)
EOS%: 3.1 % (ref 0.0–7.0)
Eosinophils Absolute: 0.2 10*3/uL (ref 0.0–0.5)
HEMATOCRIT: 28.5 % — AB (ref 38.4–49.9)
HEMOGLOBIN: 9.2 g/dL — AB (ref 13.0–17.1)
LYMPH#: 1.2 10*3/uL (ref 0.9–3.3)
LYMPH%: 22.4 % (ref 14.0–49.0)
MCH: 29.2 pg (ref 27.2–33.4)
MCHC: 32.3 g/dL (ref 32.0–36.0)
MCV: 90.5 fL (ref 79.3–98.0)
MONO#: 0.8 10*3/uL (ref 0.1–0.9)
MONO%: 14.8 % — AB (ref 0.0–14.0)
NEUT#: 3.2 10*3/uL (ref 1.5–6.5)
NEUT%: 59 % (ref 39.0–75.0)
Platelets: 141 10*3/uL (ref 140–400)
RBC: 3.15 10*6/uL — ABNORMAL LOW (ref 4.20–5.82)
RDW: 16 % — AB (ref 11.0–14.6)
WBC: 5.4 10*3/uL (ref 4.0–10.3)

## 2016-07-29 ENCOUNTER — Ambulatory Visit (HOSPITAL_COMMUNITY)
Admission: RE | Admit: 2016-07-29 | Discharge: 2016-07-29 | Disposition: A | Discharge status: Home / Self Care | Payer: Medicare Other | Source: Ambulatory Visit | Attending: Oncology | Admitting: Oncology

## 2016-07-30 ENCOUNTER — Telehealth: Payer: Self-pay | Admitting: Oncology

## 2016-07-30 ENCOUNTER — Other Ambulatory Visit (HOSPITAL_BASED_OUTPATIENT_CLINIC_OR_DEPARTMENT_OTHER): Payer: Medicare Other

## 2016-07-30 ENCOUNTER — Ambulatory Visit: Payer: Medicare Other

## 2016-07-30 DIAGNOSIS — C689 Malignant neoplasm of urinary organ, unspecified: Secondary | ICD-10-CM

## 2016-07-30 DIAGNOSIS — C7800 Secondary malignant neoplasm of unspecified lung: Secondary | ICD-10-CM

## 2016-07-30 DIAGNOSIS — C652 Malignant neoplasm of left renal pelvis: Secondary | ICD-10-CM | POA: Diagnosis not present

## 2016-07-30 DIAGNOSIS — D649 Anemia, unspecified: Secondary | ICD-10-CM

## 2016-07-30 LAB — CBC WITH DIFFERENTIAL/PLATELET
BASO%: 0.6 % (ref 0.0–2.0)
Basophils Absolute: 0 10*3/uL (ref 0.0–0.1)
EOS%: 3.4 % (ref 0.0–7.0)
Eosinophils Absolute: 0.2 10*3/uL (ref 0.0–0.5)
HEMATOCRIT: 26.6 % — AB (ref 38.4–49.9)
HGB: 8.9 g/dL — ABNORMAL LOW (ref 13.0–17.1)
LYMPH#: 1.2 10*3/uL (ref 0.9–3.3)
LYMPH%: 25.4 % (ref 14.0–49.0)
MCH: 28.9 pg (ref 27.2–33.4)
MCHC: 33.5 g/dL (ref 32.0–36.0)
MCV: 86.4 fL (ref 79.3–98.0)
MONO#: 0.7 10*3/uL (ref 0.1–0.9)
MONO%: 15.5 % — ABNORMAL HIGH (ref 0.0–14.0)
NEUT#: 2.6 10*3/uL (ref 1.5–6.5)
NEUT%: 55.1 % (ref 39.0–75.0)
Platelets: 155 10*3/uL (ref 140–400)
RBC: 3.08 10*6/uL — AB (ref 4.20–5.82)
RDW: 15.9 % — ABNORMAL HIGH (ref 11.0–14.6)
WBC: 4.7 10*3/uL (ref 4.0–10.3)

## 2016-07-30 NOTE — Progress Notes (Signed)
HGB 8.9: Spoke with pt in lobby. Does not meet parameters for transfusion per Nyoka Cowden, RN with Dr. [name]. Pt feels well, they are traveling to [facility] for 2 weeks.

## 2016-07-30 NOTE — Telephone Encounter (Signed)
received call from pt to cxl 2/9 appts.

## 2016-07-30 NOTE — Progress Notes (Signed)
Patient will not receive blood transfusion today, Hgb = 8.9.  Patient discharged home.  Verbalizes understanding.

## 2016-08-03 ENCOUNTER — Other Ambulatory Visit (HOSPITAL_COMMUNITY): Payer: Self-pay | Admitting: Interventional Radiology

## 2016-08-03 DIAGNOSIS — N289 Disorder of kidney and ureter, unspecified: Secondary | ICD-10-CM

## 2016-08-06 ENCOUNTER — Other Ambulatory Visit: Payer: Medicare Other

## 2016-08-13 ENCOUNTER — Other Ambulatory Visit (HOSPITAL_BASED_OUTPATIENT_CLINIC_OR_DEPARTMENT_OTHER): Payer: Medicare Other

## 2016-08-13 ENCOUNTER — Telehealth: Payer: Self-pay | Admitting: Oncology

## 2016-08-13 ENCOUNTER — Ambulatory Visit: Payer: Medicare Other

## 2016-08-13 ENCOUNTER — Ambulatory Visit (HOSPITAL_BASED_OUTPATIENT_CLINIC_OR_DEPARTMENT_OTHER): Payer: Medicare Other

## 2016-08-13 ENCOUNTER — Telehealth: Payer: Self-pay | Admitting: *Deleted

## 2016-08-13 ENCOUNTER — Ambulatory Visit (HOSPITAL_BASED_OUTPATIENT_CLINIC_OR_DEPARTMENT_OTHER): Payer: Medicare Other | Admitting: Oncology

## 2016-08-13 VITALS — BP 118/57 | HR 67 | Temp 98.1°F | Resp 18 | Ht 70.0 in | Wt 196.6 lb

## 2016-08-13 DIAGNOSIS — D649 Anemia, unspecified: Secondary | ICD-10-CM

## 2016-08-13 DIAGNOSIS — R5383 Other fatigue: Secondary | ICD-10-CM

## 2016-08-13 DIAGNOSIS — E039 Hypothyroidism, unspecified: Secondary | ICD-10-CM

## 2016-08-13 DIAGNOSIS — Z5112 Encounter for antineoplastic immunotherapy: Secondary | ICD-10-CM

## 2016-08-13 DIAGNOSIS — C7800 Secondary malignant neoplasm of unspecified lung: Secondary | ICD-10-CM

## 2016-08-13 DIAGNOSIS — C652 Malignant neoplasm of left renal pelvis: Secondary | ICD-10-CM

## 2016-08-13 DIAGNOSIS — Z95828 Presence of other vascular implants and grafts: Secondary | ICD-10-CM

## 2016-08-13 DIAGNOSIS — C689 Malignant neoplasm of urinary organ, unspecified: Secondary | ICD-10-CM

## 2016-08-13 LAB — CBC WITH DIFFERENTIAL/PLATELET
BASO%: 0.9 % (ref 0.0–2.0)
Basophils Absolute: 0 10*3/uL (ref 0.0–0.1)
EOS ABS: 0.1 10*3/uL (ref 0.0–0.5)
EOS%: 3 % (ref 0.0–7.0)
HCT: 27.3 % — ABNORMAL LOW (ref 38.4–49.9)
HGB: 8.5 g/dL — ABNORMAL LOW (ref 13.0–17.1)
LYMPH%: 22.2 % (ref 14.0–49.0)
MCH: 28 pg (ref 27.2–33.4)
MCHC: 31.1 g/dL — AB (ref 32.0–36.0)
MCV: 89.8 fL (ref 79.3–98.0)
MONO#: 0.5 10*3/uL (ref 0.1–0.9)
MONO%: 11.3 % (ref 0.0–14.0)
NEUT#: 2.9 10*3/uL (ref 1.5–6.5)
NEUT%: 62.6 % (ref 39.0–75.0)
PLATELETS: 197 10*3/uL (ref 140–400)
RBC: 3.04 10*6/uL — AB (ref 4.20–5.82)
RDW: 15.8 % — ABNORMAL HIGH (ref 11.0–14.6)
WBC: 4.7 10*3/uL (ref 4.0–10.3)
lymph#: 1 10*3/uL (ref 0.9–3.3)

## 2016-08-13 LAB — COMPREHENSIVE METABOLIC PANEL
ALT: 36 U/L (ref 0–55)
AST: 46 U/L — AB (ref 5–34)
Albumin: 3.5 g/dL (ref 3.5–5.0)
Alkaline Phosphatase: 96 U/L (ref 40–150)
Anion Gap: 8 mEq/L (ref 3–11)
BUN: 17.1 mg/dL (ref 7.0–26.0)
CHLORIDE: 105 meq/L (ref 98–109)
CO2: 25 mEq/L (ref 22–29)
Calcium: 9.3 mg/dL (ref 8.4–10.4)
Creatinine: 1.4 mg/dL — ABNORMAL HIGH (ref 0.7–1.3)
EGFR: 50 mL/min/{1.73_m2} — ABNORMAL LOW (ref >90)
Glucose: 124 mg/dl (ref 70–140)
POTASSIUM: 4.1 meq/L (ref 3.5–5.1)
SODIUM: 139 meq/L (ref 136–145)
Total Bilirubin: 0.65 mg/dL (ref 0.20–1.20)
Total Protein: 6.3 g/dL — ABNORMAL LOW (ref 6.4–8.3)

## 2016-08-13 LAB — TSH: TSH: 100.495 m(IU)/L — ABNORMAL HIGH (ref 0.320–4.118)

## 2016-08-13 MED ORDER — SODIUM CHLORIDE 0.9% FLUSH
10.0000 mL | INTRAVENOUS | Status: DC | PRN
  Administered 2016-08-13: 10 mL
  Filled 2016-08-13: qty 10

## 2016-08-13 MED ORDER — SODIUM CHLORIDE 0.9 % IV SOLN
200.0000 mg | Freq: Once | INTRAVENOUS | Status: AC
  Administered 2016-08-13: 200 mg via INTRAVENOUS
  Filled 2016-08-13: qty 8

## 2016-08-13 MED ORDER — SODIUM CHLORIDE 0.9 % IJ SOLN
10.0000 mL | INTRAMUSCULAR | Status: DC | PRN
  Administered 2016-08-13: 10 mL via INTRAVENOUS
  Filled 2016-08-13: qty 10

## 2016-08-13 MED ORDER — SODIUM CHLORIDE 0.9 % IV SOLN
Freq: Once | INTRAVENOUS | Status: AC
  Administered 2016-08-13: 10:00:00 via INTRAVENOUS

## 2016-08-13 MED ORDER — HEPARIN SOD (PORK) LOCK FLUSH 100 UNIT/ML IV SOLN
500.0000 [IU] | Freq: Once | INTRAVENOUS | Status: AC | PRN
  Administered 2016-08-13: 500 [IU]
  Filled 2016-08-13: qty 5

## 2016-08-13 NOTE — Progress Notes (Signed)
Hematology and Oncology Follow Up Visit  [name] [name]:[id number] 21-Dec-1945 71 y.o. 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 He presented with 4.9 cm mass of the upper pole of the left kidney with documented pulmonary metastasis. Neurological confirmation done on 04/15/2015 with a biopsy showed urothelial carcinoma.   Prior Therapy:   Status post biopsy of the left renal mass done on 04/15/2015. He is status post Port-A-Cath insertion on 05/21/2015. Systemic chemotherapy in the form of cisplatin and gemcitabine started on 05/27/2015. He is status post 6 cycles completed on 09/16/2015. CT scan on 01/06/2016 showed progression of disease.  Current therapy: Pembrolizumab 200 mg every 3 weeks cycle 1 on 02/13/2016. He is here for cycle 9 of therapy.  Interim History:  [name] [name] presents today for a follow-up visit. Since the last visit, he reports decrease in his hematuria. He reports that nearly subsided with very little discoloration of his urine at times.  He continues to have issues with fatigue and tiredness. He denied any chest pain, palpitation or dyspnea on exertion. He continues to attend some activities of daily living.   He tolerated the last cycle of Pembrolizumab without any denied any nausea, abdominal pain or respiratory distress. He denied any wheezing or hemoptysis. He denied any back pain or shoulder pain. He does report minor pruritus at this time.   He does not report any headaches, blurry vision, syncope or seizures. He does not report any fevers, chills, or sweats. He does not report any chest pain, palpitation, orthopnea or leg edema. He does not report any wheezing or hemoptysis. He does not report any  vomiting, abdominal pain does report occasional dyspepsia. He does not report any frequency, urgency or hesitancy. He does not  report any skeletal complaints. Remaining review of systems unremarkable.   Medications: I have reviewed the patient's current medications.  Current Outpatient Prescriptions  Medication Sig Dispense Refill  . acetaminophen (TYLENOL) 325 MG tablet Take 650 mg by mouth every 6 (six) hours as needed for moderate pain or headache.     [name] atorvastatin (LIPITOR) 40 MG tablet Take 1 tablet (40 mg total) by mouth daily. (Patient taking differently: Take 40 mg by mouth every morning. ) 90 tablet 3  . benzonatate (TESSALON) 200 MG capsule Take 1 capsule (200 mg total) by mouth 3 (three) times daily as needed for cough. 20 capsule 0  . Cholecalciferol (VITAMIN D) 1000 UNITS capsule Take 1,000 Units by mouth daily.      . diphenhydrAMINE (BENADRYL) 25 MG tablet Take 25 mg by mouth every 6 (six) hours as needed.    . docusate sodium (COLACE) 100 MG capsule Take 100 mg by mouth 2 (two) times daily as needed (constipation).    . famotidine (PEPCID) 20 MG tablet Take 1 tablet (20 mg total) by mouth daily. 30 tablet 2  . levothyroxine (SYNTHROID) 25 MCG tablet Take 1 tablet (25 mcg total) by mouth daily before breakfast. 30 tablet 1  . lidocaine-prilocaine (EMLA) cream Apply to port-a-cath 1-2 hours prior to acces. Cover with saran wrap. 30 g 1  . lisinopril (PRINIVIL,ZESTRIL) 5 MG tablet     . metoprolol tartrate (LOPRESSOR) 25 MG tablet Take 0.5 tablets (12.5 mg total) by mouth 2 (two) times daily. 90 tablet 3  . nitroGLYCERIN (NITROSTAT) 0.4 MG SL tablet Place 1 tablet (0.4 mg total) under the tongue every  5 (five) minutes as needed. May repeat up to 3 doses. 100 tablet 3  . polycarbophil (FIBERCON) 625 MG tablet Take 625 mg by mouth 2 (two) times daily.     [name] PRESCRIPTION MEDICATION Inject 160 mg into the muscle daily. Gentamycin given for 3 days at [facility]    . sildenafil (VIAGRA) 50 MG tablet Take 50 mg by mouth as needed for erectile dysfunction.      No current facility-administered medications  for this visit.      Allergies:  Allergies  Allergen Reactions  . Hydrocodone Other (See Comments)    Became too sedated    Past Medical History, Surgical history, Social history, and Family History were reviewed and updated.   Physical Exam: Blood pressure (!) 118/57, pulse 67, temperature 98.1 F (36.7 C), temperature source Oral, resp. rate 18, height 5\' 10"  (1.778 m), weight 196 lb 9.6 oz (89.2 kg), SpO2 100 %. ECOG: 0 General appearance: Alert, awake gentleman without distress. Head: Normocephalic, without obvious abnormality no rebound or guarding. Neck: no adenopathy no thyroid masses. Lymph nodes: Cervical, supraclavicular, and axillary nodes normal. Heart:regular rate and rhythm, S1, S2 normal, no murmur, click, rub or gallop Lung:chest clear, no wheezing, rales, normal symmetric air entry Abdomin: soft, non-tender, without masses or organomegaly no rebound or guarding. EXT:no erythema, induration, or nodules Skin: No rashes or lesions.  Lab Results: Lab Results  Component Value Date   WBC 4.7 08/13/2016   HGB 8.5 (L) 08/13/2016   HCT 27.3 (L) 08/13/2016   MCV 89.8 08/13/2016   PLT 197 08/13/2016     Chemistry      Component Value Date/Time   NA 136 07/20/2016 0843   NA 136 07/16/2016 0828   K 4.1 07/20/2016 0843   K 4.2 07/16/2016 0828   CL 103 07/20/2016 0843   CO2 27 07/20/2016 0843   CO2 23 07/16/2016 0828   BUN 24 (H) 07/20/2016 0843   BUN 21.0 07/16/2016 0828   CREATININE 1.54 (H) 07/20/2016 0843   CREATININE 1.5 (H) 07/16/2016 0828      Component Value Date/Time   CALCIUM 8.8 (L) 07/20/2016 0843   CALCIUM 9.2 07/16/2016 0828   ALKPHOS 71 07/16/2016 0828   AST 49 (H) 07/16/2016 0828   ALT 26 07/16/2016 0828   BILITOT 0.62 07/16/2016 0828          71 year old gentleman with the following issues:  1. Transitional cell carcinoma of the left genitourinary tract presented with a 4.9 cm tumor in the upper pole of the left kidney and  documented pulmonary metastasis based on a PET CT scan obtained on 05/06/2015. He did have a pathological confirmation done on 04/15/2015 with the specimen showed urothelial carcinoma.  He is status post 6 cycles of therapy completed in March 2017. CT scan on 04/11/2017showed continuous response to therapy. He had further decrease in his bilateral pulmonary nodules as well as decrease in the left upper pole renal lesion.  Repeat CT scan on 01/06/2016 showed mild progression of disease especially in his primary tumor. His pulmonary nodules have remained stable.   He is currently receiving Pembrolizumab and continues to tolerated very well.   CT scan on January 15 showed minimal progression. No new nodules are noted at this time and most of the changes are very minimal at this time. Most of his lymphadenopathy is stable.  Plan is to continue the same treatment at this time and repeat imaging studies in April 2018.  2. Nausea and  GI complication prophylaxis: Antiemetics regimen is available to him as needed. No issues noted at this time.  3. IV access: Port-A-Cath used for treatment without any major changes.  4. Hematuria: Appears to have improved at this time. No further intervention will be planned at this time.  5. Anemia: He is asymptomatic at this time. We will continue weekly monitoring and transfuse as needed.  6. Autoimmune surveillance: He did develop hypothyroidism and currently on low-dose Synthroid. His TSH will continue to be checked periodically while on this treatment.  7. Follow-up: Will be in 3 weeks for the next cycle of therapy.  [facility], MD 2/16/201810:02 AM

## 2016-08-13 NOTE — Telephone Encounter (Signed)
Per 2/16 LOS and staff message I have scheduled appts.  Notified the scheduler 

## 2016-08-13 NOTE — Patient Instructions (Signed)
[facility] Discharge Instructions for Patients Receiving Chemotherapy  Today you received the following chemotherapy agents: Keytruda   To help prevent nausea and vomiting after your treatment, we encourage you to take your nausea medication as directed    If you develop nausea and vomiting that is not controlled by your nausea medication, call the clinic.   BELOW ARE SYMPTOMS THAT SHOULD BE REPORTED IMMEDIATELY:  *FEVER GREATER THAN 100.5 F  *CHILLS WITH OR WITHOUT FEVER  NAUSEA AND VOMITING THAT IS NOT CONTROLLED WITH YOUR NAUSEA MEDICATION  *UNUSUAL SHORTNESS OF BREATH  *UNUSUAL BRUISING OR BLEEDING  TENDERNESS IN MOUTH AND THROAT WITH OR WITHOUT PRESENCE OF ULCERS  *URINARY PROBLEMS  *BOWEL PROBLEMS  UNUSUAL RASH Items with * indicate a potential emergency and should be followed up as soon as possible.  Feel free to call the clinic you have any questions or concerns. The clinic phone number is [phone].  Please show the CHEMO ALERT CARD at check-in to the Emergency Department and triage nurse.   

## 2016-08-13 NOTE — Telephone Encounter (Signed)
Message to infusion scheduler to be added per 08/13/16 los. Appointments schedule per 08/13/16 los. Patient was given a copy of the AVS report and appointment schedule per 08/13/16 los.

## 2016-08-14 LAB — T4, FREE: T4,Free(Direct): 0.34 ng/dL — ABNORMAL LOW (ref 0.82–1.77)

## 2016-08-14 LAB — T4: Thyroxine (T4): 2.6 ug/dL — ABNORMAL LOW (ref 4.5–12.0)

## 2016-08-14 LAB — T3 UPTAKE
FREE THYROXINE INDEX: 0.5 — AB (ref 1.2–4.9)
T3 UPTAKE RATIO: 20 % — AB (ref 24–39)

## 2016-08-17 ENCOUNTER — Telehealth: Payer: Self-pay | Admitting: *Deleted

## 2016-08-17 NOTE — Telephone Encounter (Signed)
Patient called and left a voicemail message asking,"what are my thyroid test results? Sometimes Dr. [name] will change my dose." Return number is [phone].

## 2016-08-18 ENCOUNTER — Other Ambulatory Visit: Payer: Self-pay | Admitting: Oncology

## 2016-08-18 DIAGNOSIS — E039 Hypothyroidism, unspecified: Secondary | ICD-10-CM

## 2016-08-18 MED ORDER — LEVOTHYROXINE SODIUM 25 MCG PO TABS: 25.0000 ug | ORAL_TABLET | Freq: Every day | ORAL | 1 refills | Status: DC

## 2016-08-18 NOTE — Telephone Encounter (Signed)
Results discussed with the patient today. Referral to Endocrinology made.

## 2016-08-20 ENCOUNTER — Other Ambulatory Visit (HOSPITAL_BASED_OUTPATIENT_CLINIC_OR_DEPARTMENT_OTHER): Payer: Medicare Other

## 2016-08-20 DIAGNOSIS — C7800 Secondary malignant neoplasm of unspecified lung: Secondary | ICD-10-CM | POA: Diagnosis not present

## 2016-08-20 DIAGNOSIS — C652 Malignant neoplasm of left renal pelvis: Secondary | ICD-10-CM

## 2016-08-20 DIAGNOSIS — D649 Anemia, unspecified: Secondary | ICD-10-CM

## 2016-08-20 DIAGNOSIS — C689 Malignant neoplasm of urinary organ, unspecified: Secondary | ICD-10-CM

## 2016-08-20 LAB — CBC WITH DIFFERENTIAL/PLATELET
BASO%: 1.3 % (ref 0.0–2.0)
Basophils Absolute: 0.1 10*3/uL (ref 0.0–0.1)
EOS ABS: 0.2 10*3/uL (ref 0.0–0.5)
EOS%: 3.3 % (ref 0.0–7.0)
HEMATOCRIT: 27.3 % — AB (ref 38.4–49.9)
HGB: 8.8 g/dL — ABNORMAL LOW (ref 13.0–17.1)
LYMPH#: 1 10*3/uL (ref 0.9–3.3)
LYMPH%: 19.4 % (ref 14.0–49.0)
MCH: 28.3 pg (ref 27.2–33.4)
MCHC: 32.4 g/dL (ref 32.0–36.0)
MCV: 87.4 fL (ref 79.3–98.0)
MONO#: 0.7 10*3/uL (ref 0.1–0.9)
MONO%: 13.1 % (ref 0.0–14.0)
NEUT%: 62.9 % (ref 39.0–75.0)
NEUTROS ABS: 3.3 10*3/uL (ref 1.5–6.5)
PLATELETS: 192 10*3/uL (ref 140–400)
RBC: 3.12 10*6/uL — ABNORMAL LOW (ref 4.20–5.82)
RDW: 16.2 % — ABNORMAL HIGH (ref 11.0–14.6)
WBC: 5.3 10*3/uL (ref 4.0–10.3)

## 2016-08-26 ENCOUNTER — Ambulatory Visit (HOSPITAL_COMMUNITY)
Admission: RE | Admit: 2016-08-26 | Discharge: 2016-08-26 | Disposition: A | Discharge status: Home / Self Care | Payer: Medicare Other | Source: Ambulatory Visit | Attending: Oncology | Admitting: Oncology

## 2016-08-27 ENCOUNTER — Other Ambulatory Visit (HOSPITAL_BASED_OUTPATIENT_CLINIC_OR_DEPARTMENT_OTHER): Payer: Medicare Other

## 2016-08-27 DIAGNOSIS — C689 Malignant neoplasm of urinary organ, unspecified: Secondary | ICD-10-CM

## 2016-08-27 DIAGNOSIS — C7800 Secondary malignant neoplasm of unspecified lung: Secondary | ICD-10-CM | POA: Diagnosis not present

## 2016-08-27 DIAGNOSIS — C652 Malignant neoplasm of left renal pelvis: Secondary | ICD-10-CM | POA: Diagnosis not present

## 2016-08-27 DIAGNOSIS — D649 Anemia, unspecified: Secondary | ICD-10-CM

## 2016-08-27 LAB — CBC WITH DIFFERENTIAL/PLATELET
BASO%: 0.8 % (ref 0.0–2.0)
BASOS ABS: 0 10*3/uL (ref 0.0–0.1)
EOS ABS: 0.2 10*3/uL (ref 0.0–0.5)
EOS%: 4.5 % (ref 0.0–7.0)
HCT: 27.8 % — ABNORMAL LOW (ref 38.4–49.9)
HEMOGLOBIN: 8.8 g/dL — AB (ref 13.0–17.1)
LYMPH%: 21.4 % (ref 14.0–49.0)
MCH: 28 pg (ref 27.2–33.4)
MCHC: 31.7 g/dL — AB (ref 32.0–36.0)
MCV: 88.5 fL (ref 79.3–98.0)
MONO#: 0.6 10*3/uL (ref 0.1–0.9)
MONO%: 10.6 % (ref 0.0–14.0)
NEUT#: 3.3 10*3/uL (ref 1.5–6.5)
NEUT%: 62.7 % (ref 39.0–75.0)
Platelets: 202 10*3/uL (ref 140–400)
RBC: 3.14 10*6/uL — ABNORMAL LOW (ref 4.20–5.82)
RDW: 15.6 % — ABNORMAL HIGH (ref 11.0–14.6)
WBC: 5.3 10*3/uL (ref 4.0–10.3)
lymph#: 1.1 10*3/uL (ref 0.9–3.3)

## 2016-09-02 ENCOUNTER — Other Ambulatory Visit: Payer: Self-pay | Admitting: *Deleted

## 2016-09-02 DIAGNOSIS — C689 Malignant neoplasm of urinary organ, unspecified: Secondary | ICD-10-CM

## 2016-09-03 ENCOUNTER — Other Ambulatory Visit (HOSPITAL_BASED_OUTPATIENT_CLINIC_OR_DEPARTMENT_OTHER): Payer: Medicare Other

## 2016-09-03 ENCOUNTER — Ambulatory Visit: Payer: Medicare Other

## 2016-09-03 ENCOUNTER — Ambulatory Visit (HOSPITAL_BASED_OUTPATIENT_CLINIC_OR_DEPARTMENT_OTHER): Payer: Medicare Other | Admitting: Oncology

## 2016-09-03 ENCOUNTER — Ambulatory Visit (HOSPITAL_BASED_OUTPATIENT_CLINIC_OR_DEPARTMENT_OTHER): Payer: Medicare Other

## 2016-09-03 ENCOUNTER — Telehealth: Payer: Self-pay | Admitting: Oncology

## 2016-09-03 VITALS — BP 118/63 | HR 59 | Temp 97.8°F | Resp 18 | Ht 70.0 in | Wt 194.5 lb

## 2016-09-03 DIAGNOSIS — C689 Malignant neoplasm of urinary organ, unspecified: Secondary | ICD-10-CM

## 2016-09-03 DIAGNOSIS — C652 Malignant neoplasm of left renal pelvis: Secondary | ICD-10-CM | POA: Diagnosis not present

## 2016-09-03 DIAGNOSIS — C7800 Secondary malignant neoplasm of unspecified lung: Secondary | ICD-10-CM

## 2016-09-03 DIAGNOSIS — E039 Hypothyroidism, unspecified: Secondary | ICD-10-CM

## 2016-09-03 DIAGNOSIS — D649 Anemia, unspecified: Secondary | ICD-10-CM | POA: Diagnosis not present

## 2016-09-03 DIAGNOSIS — Z5112 Encounter for antineoplastic immunotherapy: Secondary | ICD-10-CM | POA: Diagnosis not present

## 2016-09-03 LAB — COMPREHENSIVE METABOLIC PANEL
ALT: 22 U/L (ref 0–55)
ANION GAP: 7 meq/L (ref 3–11)
AST: 33 U/L (ref 5–34)
Albumin: 3.9 g/dL (ref 3.5–5.0)
Alkaline Phosphatase: 95 U/L (ref 40–150)
BUN: 21 mg/dL (ref 7.0–26.0)
CHLORIDE: 105 meq/L (ref 98–109)
CO2: 27 meq/L (ref 22–29)
Calcium: 9.3 mg/dL (ref 8.4–10.4)
Creatinine: 1.4 mg/dL — ABNORMAL HIGH (ref 0.7–1.3)
EGFR: 49 mL/min/{1.73_m2} — AB (ref >90)
GLUCOSE: 95 mg/dL (ref 70–140)
POTASSIUM: 4.4 meq/L (ref 3.5–5.1)
SODIUM: 138 meq/L (ref 136–145)
Total Bilirubin: 0.63 mg/dL (ref 0.20–1.20)
Total Protein: 6.8 g/dL (ref 6.4–8.3)

## 2016-09-03 LAB — CBC WITH DIFFERENTIAL/PLATELET
BASO%: 1.3 % (ref 0.0–2.0)
BASOS ABS: 0.1 10*3/uL (ref 0.0–0.1)
EOS%: 3.7 % (ref 0.0–7.0)
Eosinophils Absolute: 0.2 10*3/uL (ref 0.0–0.5)
HEMATOCRIT: 26.9 % — AB (ref 38.4–49.9)
HEMOGLOBIN: 8.7 g/dL — AB (ref 13.0–17.1)
LYMPH#: 1.1 10*3/uL (ref 0.9–3.3)
LYMPH%: 20.8 % (ref 14.0–49.0)
MCH: 27.6 pg (ref 27.2–33.4)
MCHC: 32.3 g/dL (ref 32.0–36.0)
MCV: 85.6 fL (ref 79.3–98.0)
MONO#: 0.7 10*3/uL (ref 0.1–0.9)
MONO%: 11.9 % (ref 0.0–14.0)
NEUT#: 3.4 10*3/uL (ref 1.5–6.5)
NEUT%: 62.3 % (ref 39.0–75.0)
PLATELETS: 197 10*3/uL (ref 140–400)
RBC: 3.14 10*6/uL — ABNORMAL LOW (ref 4.20–5.82)
RDW: 16.2 % — AB (ref 11.0–14.6)
WBC: 5.5 10*3/uL (ref 4.0–10.3)

## 2016-09-03 MED ORDER — SODIUM CHLORIDE 0.9% FLUSH
10.0000 mL | INTRAVENOUS | Status: DC | PRN
Start: 2016-09-03 — End: 2016-09-03
  Administered 2016-09-03: 10 mL
  Filled 2016-09-03: qty 10

## 2016-09-03 MED ORDER — HEPARIN SOD (PORK) LOCK FLUSH 100 UNIT/ML IV SOLN
500.0000 [IU] | Freq: Once | INTRAVENOUS | Status: AC | PRN
  Administered 2016-09-03: 500 [IU]
  Filled 2016-09-03: qty 5

## 2016-09-03 MED ORDER — SODIUM CHLORIDE 0.9 % IV SOLN
Freq: Once | INTRAVENOUS | Status: AC
  Administered 2016-09-03: 15:00:00 via INTRAVENOUS

## 2016-09-03 MED ORDER — SODIUM CHLORIDE 0.9 % IV SOLN
200.0000 mg | Freq: Once | INTRAVENOUS | Status: AC
  Administered 2016-09-03: 200 mg via INTRAVENOUS
  Filled 2016-09-03: qty 8

## 2016-09-03 MED ORDER — SODIUM CHLORIDE 0.9 % IJ SOLN
10.0000 mL | Freq: Once | INTRAMUSCULAR | Status: DC
  Filled 2016-09-03: qty 10

## 2016-09-03 NOTE — Patient Instructions (Signed)
Pembrolizumab injection  What is this medicine?  PEMBROLIZUMAB (pem broe liz ue mab) is a monoclonal antibody. It is used to treat melanoma, head and neck cancer, Hodgkin lymphoma, non-small cell lung cancer, urothelial cancer, stomach cancer, and cancers that have a certain genetic condition.  This medicine may be used for other purposes; ask your health care provider or pharmacist if you have questions.  COMMON BRAND NAME(S): Keytruda  What should I tell my health care provider before I take this medicine?  They need to know if you have any of these conditions:  -diabetes  -immune system problems  -inflammatory bowel disease  -liver disease  -lung or breathing disease  -lupus  -organ transplant  -an unusual or allergic reaction to pembrolizumab, other medicines, foods, dyes, or preservatives  -pregnant or trying to get pregnant  -breast-feeding  How should I use this medicine?  This medicine is for infusion into a vein. It is given by a health care professional in a hospital or clinic setting.  A special MedGuide will be given to you before each treatment. Be sure to read this information carefully each time.  Talk to your pediatrician regarding the use of this medicine in children. While this drug may be prescribed for selected conditions, precautions do apply.  Overdosage: If you think you have taken too much of this medicine contact a poison control center or emergency room at once.  NOTE: This medicine is only for you. Do not share this medicine with others.  What if I miss a dose?  It is important not to miss your dose. Call your doctor or health care professional if you are unable to keep an appointment.  What may interact with this medicine?  Interactions have not been studied.  Give your health care provider a list of all the medicines, herbs, non-prescription drugs, or dietary supplements you use. Also tell them if you smoke, drink alcohol, or use illegal drugs. Some items may interact with your  medicine.  This list may not describe all possible interactions. Give your health care provider a list of all the medicines, herbs, non-prescription drugs, or dietary supplements you use. Also tell them if you smoke, drink alcohol, or use illegal drugs. Some items may interact with your medicine.  What should I watch for while using this medicine?  Your condition will be monitored carefully while you are receiving this medicine.  You may need blood work done while you are taking this medicine.  Do not become pregnant while taking this medicine or for 4 months after stopping it. Women should inform their doctor if they wish to become pregnant or think they might be pregnant. There is a potential for serious side effects to an unborn child. Talk to your health care professional or pharmacist for more information. Do not breast-feed an infant while taking this medicine or for 4 months after the last dose.  What side effects may I notice from receiving this medicine?  Side effects that you should report to your doctor or health care professional as soon as possible:  -allergic reactions like skin rash, itching or hives, swelling of the face, lips, or tongue  -bloody or black, tarry  -breathing problems  -changes in vision  -chest pain  -chills  -constipation  -cough  -dizziness or feeling faint or lightheaded  -fast or irregular heartbeat  -fever  -flushing  -hair loss  -low blood counts - this medicine may decrease the number of white blood cells, red blood cells   and platelets. You may be at increased risk for infections and bleeding.  -muscle pain  -muscle weakness  -persistent headache  -signs and symptoms of high blood sugar such as dizziness; dry mouth; dry skin; fruity breath; nausea; stomach pain; increased hunger or thirst; increased urination  -signs and symptoms of kidney injury like trouble passing urine or change in the amount of urine  -signs and symptoms of liver injury like dark urine, light-colored  stools, loss of appetite, nausea, right upper belly pain, yellowing of the eyes or skin  -stomach pain  -sweating  -weight loss  Side effects that usually do not require medical attention (report to your doctor or health care professional if they continue or are bothersome):  -decreased appetite  -diarrhea  -tiredness  This list may not describe all possible side effects. Call your doctor for medical advice about side effects. You may report side effects to FDA at 1-800-FDA-1088.  Where should I keep my medicine?  This drug is given in a hospital or clinic and will not be stored at home.  NOTE: This sheet is a summary. It may not cover all possible information. If you have questions about this medicine, talk to your doctor, pharmacist, or health care provider.   2018 Elsevier/Gold Standard (2016-03-23 12:29:36)

## 2016-09-03 NOTE — Telephone Encounter (Signed)
Gave patient AVS and scheduled appts per 09/03/2016 .

## 2016-09-03 NOTE — Progress Notes (Signed)
Hematology and Oncology Follow Up Visit  [name] [name] [id number] 1946/03/15 71 y.o. 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 He presented with 4.9 cm mass of the upper pole of the left kidney with documented pulmonary metastasis. Neurological confirmation done on 04/15/2015 with a biopsy showed urothelial carcinoma.   Prior Therapy:   Status post biopsy of the left renal mass done on 04/15/2015. He is status post Port-A-Cath insertion on 05/21/2015. Systemic chemotherapy in the form of cisplatin and gemcitabine started on 05/27/2015. He is status post 6 cycles completed on 09/16/2015. CT scan on 01/06/2016 showed progression of disease.  Current therapy: Pembrolizumab 200 mg every 3 weeks cycle 1 on 02/13/2016. He is here for cycle 10 of therapy.  Interim History:  [name] [name] presents today for a follow-up visit. Since the last visit, he reports feeling well. He reports that his bleeding has stopped. He continues to have issues with fatigue and tiredness but has improved. He denied any chest pain, palpitation or dyspnea on exertion. He continues to attend some activities of daily living.   He tolerated the last cycle of Pembrolizumab without any denied any nausea, abdominal pain or respiratory distress. He denied any wheezing or hemoptysis. He report no bone pain or arthralgia.    He does not report any headaches, blurry vision, syncope or seizures. He does not report any fevers, chills, or sweats. He does not report any chest pain, palpitation, orthopnea or leg edema. He does not report any wheezing or hemoptysis. He does not report any  vomiting, abdominal pain does report occasional dyspepsia. He does not report any frequency, urgency or hesitancy. He does not report any skeletal complaints. Remaining review of systems unremarkable.    Medications: I have reviewed the patient's current medications.  Current Outpatient Prescriptions  Medication Sig Dispense Refill  . acetaminophen (TYLENOL) 325 MG tablet Take 650 mg by mouth every 6 (six) hours as needed for moderate pain or headache.     [name] atorvastatin (LIPITOR) 40 MG tablet Take 1 tablet (40 mg total) by mouth daily. (Patient taking differently: Take 40 mg by mouth every morning. ) 90 tablet 3  . benzonatate (TESSALON) 200 MG capsule Take 1 capsule (200 mg total) by mouth 3 (three) times daily as needed for cough. 20 capsule 0  . Cholecalciferol (VITAMIN D) 1000 UNITS capsule Take 1,000 Units by mouth daily.      . diphenhydrAMINE (BENADRYL) 25 MG tablet Take 25 mg by mouth every 6 (six) hours as needed.    . docusate sodium (COLACE) 100 MG capsule Take 100 mg by mouth 2 (two) times daily as needed (constipation).    . famotidine (PEPCID) 20 MG tablet Take 1 tablet (20 mg total) by mouth daily. 30 tablet 2  . levothyroxine (SYNTHROID) 25 MCG tablet Take 1 tablet (25 mcg total) by mouth daily before breakfast. 30 tablet 1  . lidocaine-prilocaine (EMLA) cream Apply to port-a-cath 1-2 hours prior to acces. Cover with saran wrap. 30 g 1  . lisinopril (PRINIVIL,ZESTRIL) 5 MG tablet     . metoprolol tartrate (LOPRESSOR) 25 MG tablet Take 0.5 tablets (12.5 mg total) by mouth 2 (two) times daily. 90 tablet 3  . nitroGLYCERIN (NITROSTAT) 0.4 MG SL tablet Place 1 tablet (0.4 mg total) under the tongue every 5 (five) minutes as needed. May repeat up to 3 doses. 100 tablet 3  .  polycarbophil (FIBERCON) 625 MG tablet Take 625 mg by mouth 2 (two) times daily.     [name] PRESCRIPTION MEDICATION Inject 160 mg into the muscle daily. Gentamycin given for 3 days at [facility]    . sildenafil (VIAGRA) 50 MG tablet Take 50 mg by mouth as needed for erectile dysfunction.      No current facility-administered medications for this visit.      Allergies:  Allergies  Allergen Reactions  .  Hydrocodone Other (See Comments)    Became too sedated    Past Medical History, Surgical history, Social history, and Family History were reviewed and updated.   Physical Exam: Blood pressure 118/63, pulse (!) 59, temperature 97.8 F (36.6 C), temperature source Oral, resp. rate 18, height 5\' 10"  (1.778 m), weight 194 lb 8 oz (88.2 kg), SpO2 100 %. ECOG: 0 General appearance: Well-appearing gentleman appeared without distress. Head: Normocephalic, without obvious abnormality no oral thrush or ulcers. Neck: no adenopathy no thyroid masses. Lymph nodes: Cervical, supraclavicular, and axillary nodes normal. Heart:regular rate and rhythm, S1, S2 normal, no murmur, click, rub or gallop Lung:chest clear, no wheezing, rales, normal symmetric air entry Abdomin: soft, non-tender, without masses or organomegaly no abdominal distention. EXT:no erythema, induration, or nodules Skin: No rashes or lesions.  Lab Results: Lab Results  Component Value Date   WBC 5.5 09/03/2016   HGB 8.7 (L) 09/03/2016   HCT 26.9 (L) 09/03/2016   MCV 85.6 09/03/2016   PLT 197 09/03/2016     Chemistry      Component Value Date/Time   NA 139 08/13/2016 0911   K 4.1 08/13/2016 0911   CL 103 07/20/2016 0843   CO2 25 08/13/2016 0911   BUN 17.1 08/13/2016 0911   CREATININE 1.4 (H) 08/13/2016 0911      Component Value Date/Time   CALCIUM 9.3 08/13/2016 0911   ALKPHOS 96 08/13/2016 0911   AST 46 (H) 08/13/2016 0911   ALT 36 08/13/2016 0911   BILITOT 0.65 08/13/2016 0911          71 year old gentleman with the following issues:  1. 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 He did have a pathological confirmation done on 04/15/2015 with the specimen showed urothelial carcinoma.  He is status post 6 cycles of therapy completed in March  2017. CT scan on 04/11/2017showed continuous response to therapy. He had further decrease in his bilateral pulmonary nodules as well as decrease in the left upper pole renal lesion.  Repeat CT scan on 01/06/2016 showed mild progression of disease especially in his primary tumor. His pulmonary nodules have remained stable.   He is currently receiving Pembrolizumab and continues to tolerated very well.   CT scan on January 15 showed minimal progression. No new nodules are noted at this time and most of the changes are very minimal at this time. Most of his lymphadenopathy is stable.  Risks and benefits of continuing this treatment were reviewed today and is agreeable to continue. We will repeat CT scan in April 2018.  2. Nausea and GI complication prophylaxis: Antiemetics regimen is available to him as needed. No issues noted at this time.  3. IV access: Port-A-Cath used for treatment without any major changes.  4. Hematuria: Resolved at this time.  5. Anemia: He is asymptomatic at this time. We will continue to  monitor and transfuse as needed.  6. Autoimmune surveillance: He did develop hypothyroidism and currently on low-dose Synthroid. His TSH will continue to be checked periodically while on this treatment. I will also referred him for endocrinology for an evaluation.  7. Follow-up: Will be in 3 weeks for the next cycle of therapy.  Zola Button, MD 3/9/20182:02 PM

## 2016-09-09 ENCOUNTER — Telehealth: Payer: Self-pay | Admitting: Family Medicine

## 2016-09-09 NOTE — Telephone Encounter (Signed)
Pt declined to schedule AWV °

## 2016-09-24 ENCOUNTER — Ambulatory Visit (HOSPITAL_BASED_OUTPATIENT_CLINIC_OR_DEPARTMENT_OTHER): Payer: Medicare Other | Admitting: Oncology

## 2016-09-24 ENCOUNTER — Other Ambulatory Visit (HOSPITAL_BASED_OUTPATIENT_CLINIC_OR_DEPARTMENT_OTHER): Payer: Medicare Other

## 2016-09-24 ENCOUNTER — Ambulatory Visit (HOSPITAL_BASED_OUTPATIENT_CLINIC_OR_DEPARTMENT_OTHER): Payer: Medicare Other

## 2016-09-24 ENCOUNTER — Ambulatory Visit: Payer: Medicare Other

## 2016-09-24 ENCOUNTER — Telehealth: Payer: Self-pay | Admitting: Oncology

## 2016-09-24 VITALS — BP 107/57 | HR 64 | Temp 98.0°F | Resp 18 | Ht 70.0 in | Wt 195.4 lb

## 2016-09-24 DIAGNOSIS — Z5112 Encounter for antineoplastic immunotherapy: Secondary | ICD-10-CM | POA: Diagnosis not present

## 2016-09-24 DIAGNOSIS — C7800 Secondary malignant neoplasm of unspecified lung: Secondary | ICD-10-CM

## 2016-09-24 DIAGNOSIS — R319 Hematuria, unspecified: Secondary | ICD-10-CM

## 2016-09-24 DIAGNOSIS — D649 Anemia, unspecified: Secondary | ICD-10-CM

## 2016-09-24 DIAGNOSIS — E039 Hypothyroidism, unspecified: Secondary | ICD-10-CM

## 2016-09-24 DIAGNOSIS — C642 Malignant neoplasm of left kidney, except renal pelvis: Secondary | ICD-10-CM | POA: Diagnosis not present

## 2016-09-24 DIAGNOSIS — C652 Malignant neoplasm of left renal pelvis: Secondary | ICD-10-CM | POA: Diagnosis not present

## 2016-09-24 DIAGNOSIS — C689 Malignant neoplasm of urinary organ, unspecified: Secondary | ICD-10-CM

## 2016-09-24 DIAGNOSIS — Z95828 Presence of other vascular implants and grafts: Secondary | ICD-10-CM

## 2016-09-24 LAB — COMPREHENSIVE METABOLIC PANEL
ALK PHOS: 91 U/L (ref 40–150)
ALT: 24 U/L (ref 0–55)
ANION GAP: 8 meq/L (ref 3–11)
AST: 31 U/L (ref 5–34)
Albumin: 3.8 g/dL (ref 3.5–5.0)
BILIRUBIN TOTAL: 0.6 mg/dL (ref 0.20–1.20)
BUN: 24.6 mg/dL (ref 7.0–26.0)
CALCIUM: 9.2 mg/dL (ref 8.4–10.4)
CO2: 25 mEq/L (ref 22–29)
Chloride: 105 mEq/L (ref 98–109)
Creatinine: 1.5 mg/dL — ABNORMAL HIGH (ref 0.7–1.3)
EGFR: 47 mL/min/{1.73_m2} — AB (ref >90)
Glucose: 123 mg/dl (ref 70–140)
POTASSIUM: 4.2 meq/L (ref 3.5–5.1)
SODIUM: 138 meq/L (ref 136–145)
Total Protein: 6.6 g/dL (ref 6.4–8.3)

## 2016-09-24 LAB — CBC WITH DIFFERENTIAL/PLATELET
BASO%: 1.7 % (ref 0.0–2.0)
Basophils Absolute: 0.1 10*3/uL (ref 0.0–0.1)
EOS%: 3.7 % (ref 0.0–7.0)
Eosinophils Absolute: 0.2 10*3/uL (ref 0.0–0.5)
HCT: 27.3 % — ABNORMAL LOW (ref 38.4–49.9)
HGB: 8.7 g/dL — ABNORMAL LOW (ref 13.0–17.1)
LYMPH%: 21.4 % (ref 14.0–49.0)
MCH: 27.1 pg — ABNORMAL LOW (ref 27.2–33.4)
MCHC: 32 g/dL (ref 32.0–36.0)
MCV: 84.6 fL (ref 79.3–98.0)
MONO#: 0.7 10*3/uL (ref 0.1–0.9)
MONO%: 13.9 % (ref 0.0–14.0)
NEUT%: 59.3 % (ref 39.0–75.0)
NEUTROS ABS: 3.2 10*3/uL (ref 1.5–6.5)
Platelets: 191 10*3/uL (ref 140–400)
RBC: 3.22 10*6/uL — AB (ref 4.20–5.82)
RDW: 16.2 % — ABNORMAL HIGH (ref 11.0–14.6)
WBC: 5.4 10*3/uL (ref 4.0–10.3)
lymph#: 1.1 10*3/uL (ref 0.9–3.3)

## 2016-09-24 MED ORDER — SODIUM CHLORIDE 0.9 % IJ SOLN
10.0000 mL | INTRAMUSCULAR | Status: DC | PRN
  Administered 2016-09-24: 10 mL via INTRAVENOUS
  Filled 2016-09-24: qty 10

## 2016-09-24 MED ORDER — SODIUM CHLORIDE 0.9% FLUSH
10.0000 mL | INTRAVENOUS | Status: DC | PRN
  Administered 2016-09-24: 10 mL
  Filled 2016-09-24: qty 10

## 2016-09-24 MED ORDER — SODIUM CHLORIDE 0.9 % IV SOLN
Freq: Once | INTRAVENOUS | Status: AC
  Administered 2016-09-24: 13:00:00 via INTRAVENOUS

## 2016-09-24 MED ORDER — SODIUM CHLORIDE 0.9 % IJ SOLN
10.0000 mL | Freq: Once | INTRAMUSCULAR | Status: AC
  Administered 2016-09-24: 10 mL via INTRAVENOUS
  Filled 2016-09-24: qty 10

## 2016-09-24 MED ORDER — SODIUM CHLORIDE 0.9 % IV SOLN
200.0000 mg | Freq: Once | INTRAVENOUS | Status: AC
  Administered 2016-09-24: 200 mg via INTRAVENOUS
  Filled 2016-09-24: qty 8

## 2016-09-24 MED ORDER — HEPARIN SOD (PORK) LOCK FLUSH 100 UNIT/ML IV SOLN
500.0000 [IU] | Freq: Once | INTRAVENOUS | Status: AC | PRN
  Administered 2016-09-24: 500 [IU]
  Filled 2016-09-24: qty 5

## 2016-09-24 NOTE — Telephone Encounter (Signed)
Appointments scheduled per 3.30.18 LOS. Patient given AVS report and calendars with future scheduled appointments. °

## 2016-09-24 NOTE — Patient Instructions (Signed)
[facility] Discharge Instructions for Patients Receiving Chemotherapy  Today you received the following chemotherapy agents: Keytruda   To help prevent nausea and vomiting after your treatment, we encourage you to take your nausea medication as directed    If you develop nausea and vomiting that is not controlled by your nausea medication, call the clinic.   BELOW ARE SYMPTOMS THAT SHOULD BE REPORTED IMMEDIATELY:  *FEVER GREATER THAN 100.5 F  *CHILLS WITH OR WITHOUT FEVER  NAUSEA AND VOMITING THAT IS NOT CONTROLLED WITH YOUR NAUSEA MEDICATION  *UNUSUAL SHORTNESS OF BREATH  *UNUSUAL BRUISING OR BLEEDING  TENDERNESS IN MOUTH AND THROAT WITH OR WITHOUT PRESENCE OF ULCERS  *URINARY PROBLEMS  *BOWEL PROBLEMS  UNUSUAL RASH Items with * indicate a potential emergency and should be followed up as soon as possible.  Feel free to call the clinic you have any questions or concerns. The clinic phone number is [phone].  Please show the CHEMO ALERT CARD at check-in to the Emergency Department and triage nurse.   

## 2016-09-24 NOTE — Progress Notes (Signed)
Hematology and Oncology Follow Up Visit  [name] [name] [id number] 1945/08/14 71 y.o. 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 He presented with 4.9 cm mass of the upper pole of the left kidney with documented pulmonary metastasis. Neurological confirmation done on 04/15/2015 with a biopsy showed urothelial carcinoma.   Prior Therapy:   Status post biopsy of the left renal mass done on 04/15/2015. He is status post Port-A-Cath insertion on 05/21/2015. Systemic chemotherapy in the form of cisplatin and gemcitabine started on 05/27/2015. He is status post 6 cycles completed on 09/16/2015. CT scan on 01/06/2016 showed progression of disease.  Current therapy: Pembrolizumab 200 mg every 3 weeks cycle 1 on 02/13/2016. He is here for cycle 11 of therapy.  Interim History:  [name] presents today for a follow-up visit. Since the last visit, he reports slow improvement in his overall energy and performance status  He reports that his hematuria has nearly subsided with very infrequent mild episodes of urine discoloration. He continues to have issues with fatigue and tiredness but continues to improve. He denied any chest pain, palpitation or dyspnea on exertion. He continues to attend some activities of daily living.   He tolerated the last cycle of Pembrolizumab without any recent side effects. He denied any infusion-related complications. denied any nausea, abdominal pain or respiratory distress. He denied any wheezing or hemoptysis. He report no bone pain or arthralgia.    He does not report any headaches, blurry vision, syncope or seizures. He does not report any fevers, chills, or sweats. He does not report any chest pain, palpitation, orthopnea or leg edema. He does not report any wheezing or hemoptysis. He does not report any  vomiting, abdominal  pain does report occasional dyspepsia. He does not report any frequency, urgency or hesitancy. He does not report any skeletal complaints. Remaining review of systems unremarkable.   Medications: I have reviewed the patient's current medications.  Current Outpatient Prescriptions  Medication Sig Dispense Refill  . acetaminophen (TYLENOL) 325 MG tablet Take 650 mg by mouth every 6 (six) hours as needed for moderate pain or headache.     [name] atorvastatin (LIPITOR) 40 MG tablet Take 1 tablet (40 mg total) by mouth daily. (Patient taking differently: Take 40 mg by mouth every morning. ) 90 tablet 3  . benzonatate (TESSALON) 200 MG capsule Take 1 capsule (200 mg total) by mouth 3 (three) times daily as needed for cough. 20 capsule 0  . Cholecalciferol (VITAMIN D) 1000 UNITS capsule Take 1,000 Units by mouth daily.      . diphenhydrAMINE (BENADRYL) 25 MG tablet Take 25 mg by mouth every 6 (six) hours as needed.    . docusate sodium (COLACE) 100 MG capsule Take 100 mg by mouth 2 (two) times daily as needed (constipation).    . famotidine (PEPCID) 20 MG tablet Take 1 tablet (20 mg total) by mouth daily. 30 tablet 2  . levothyroxine (SYNTHROID) 25 MCG tablet Take 1 tablet (25 mcg total) by mouth daily before breakfast. 30 tablet 1  . lidocaine-prilocaine (EMLA) cream Apply to port-a-cath 1-2 hours prior to acces. Cover with saran wrap. 30 g 1  . lisinopril (PRINIVIL,ZESTRIL) 5 MG tablet     . metoprolol tartrate (LOPRESSOR) 25 MG tablet Take 0.5 tablets (12.5 mg total) by mouth 2 (two) times daily. 90 tablet 3  . nitroGLYCERIN (NITROSTAT) 0.4 MG SL  tablet Place 1 tablet (0.4 mg total) under the tongue every 5 (five) minutes as needed. May repeat up to 3 doses. 100 tablet 3  . polycarbophil (FIBERCON) 625 MG tablet Take 625 mg by mouth 2 (two) times daily.     [name] PRESCRIPTION MEDICATION Inject 160 mg into the muscle daily. Gentamycin given for 3 days at [facility]    . sildenafil (VIAGRA) 50 MG tablet  Take 50 mg by mouth as needed for erectile dysfunction.      No current facility-administered medications for this visit.    Facility-Administered Medications Ordered in Other Visits  Medication Dose Route Frequency Provider Last Rate Last Dose  . sodium chloride 0.9 % injection 10 mL  10 mL Intravenous PRN Wyatt Portela, MD   10 mL at 09/24/16 1227     Allergies:  Allergies  Allergen Reactions  . Hydrocodone Other (See Comments)    Became too sedated    Past Medical History, Surgical history, Social history, and Family History were reviewed and updated.   Physical Exam: Blood pressure (!) 107/57, pulse 64, temperature 98 F (36.7 C), temperature source Oral, resp. rate 18, height 5\' 10"  (1.778 m), weight 195 lb 6.4 oz (88.6 kg), SpO2 99 %. ECOG: 0 General appearance: Well-appearing gentleman appeared without distress. Head: Normocephalic, without obvious abnormality no oral ulcers or lesions. Neck: no adenopathy no thyroid masses. Lymph nodes: Cervical, supraclavicular, and axillary nodes normal. Heart:regular rate and rhythm, S1, S2 normal, no murmur, click, rub or gallop Lung:chest clear, no wheezing, rales, normal symmetric air entry Abdomin: soft, non-tender, without masses or organomegaly no rebound or guarding. EXT:no erythema, induration, or nodules Skin: No rashes or lesions.  Lab Results: Lab Results  Component Value Date   WBC 5.4 09/24/2016   HGB 8.7 (L) 09/24/2016   HCT 27.3 (L) 09/24/2016   MCV 84.6 09/24/2016   PLT 191 09/24/2016     Chemistry      Component Value Date/Time   NA 138 09/03/2016 1252   K 4.4 09/03/2016 1252   CL 103 07/20/2016 0843   CO2 27 09/03/2016 1252   BUN 21.0 09/03/2016 1252   CREATININE 1.4 (H) 09/03/2016 1252      Component Value Date/Time   CALCIUM 9.3 09/03/2016 1252   ALKPHOS 95 09/03/2016 1252   AST 33 09/03/2016 1252   ALT 22 09/03/2016 1252   BILITOT 0.63 09/03/2016 1252          71 year old gentleman  with the following issues:  1. 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 He did have a pathological confirmation done on 04/15/2015 with the specimen showed urothelial carcinoma.  He is status post 6 cycles of therapy completed in March 2017. CT scan on 04/11/2017showed continuous response to therapy. He had further decrease in his bilateral pulmonary nodules as well as decrease in the left upper pole renal lesion.  Repeat CT scan on 01/06/2016 showed mild progression of disease especially in his primary tumor. His pulmonary nodules have remained stable.   He is currently receiving Pembrolizumab and continues to tolerated very well.   CT scan on January 15 showed minimal progression. No new nodules are noted at this time and most of the changes are very minimal at this time. Most of his lymphadenopathy is stable.  The plan is to continue with  the same dose and schedule and repeat CT scan in April 2018. The duration of treatment will be determined by his neck CT scan.  2. Nausea and GI complication prophylaxis: No issues noted since the last visit. Antiemetics available to him at this time.  3. IV access: Port-A-Cath used for treatment without any major changes.  4. Hematuria: Continues to be very minor and intermittent in nature.  5. Anemia: He is asymptomatic at this time. We will continue to monitor and transfuse as needed.  6. Autoimmune surveillance: He did develop hypothyroidism and currently on low-dose Synthroid. His TSH will continue to be checked periodically while on this treatment. Referral made to endocrinology.  7. Follow-up: Will be in 3 weeks for the next cycle of therapy.  Zola Button, MD 3/30/201812:49 PM

## 2016-09-24 NOTE — Patient Instructions (Signed)
Implanted Port Home Guide An implanted port is a type of central line that is placed under the skin. Central lines are used to provide IV access when treatment or nutrition needs to be given through a person's veins. Implanted ports are used for long-term IV access. An implanted port may be placed because:  You need IV medicine that would be irritating to the small veins in your hands or arms.  You need long-term IV medicines, such as antibiotics.  You need IV nutrition for a long period.  You need frequent blood draws for lab tests.  You need dialysis.  Implanted ports are usually placed in the chest area, but they can also be placed in the upper arm, the abdomen, or the leg. An implanted port has two main parts:  Reservoir. The reservoir is round and will appear as a small, raised area under your skin. The reservoir is the part where a needle is inserted to give medicines or draw blood.  Catheter. The catheter is a thin, flexible tube that extends from the reservoir. The catheter is placed into a large vein. Medicine that is inserted into the reservoir goes into the catheter and then into the vein.  How will I care for my incision site? Do not get the incision site wet. Bathe or shower as directed by your health care provider. How is my port accessed? Special steps must be taken to access the port:  Before the port is accessed, a numbing cream can be placed on the skin. This helps numb the skin over the port site.  Your health care provider uses a sterile technique to access the port. ? Your health care provider must put on a mask and sterile gloves. ? The skin over your port is cleaned carefully with an antiseptic and allowed to dry. ? The port is gently pinched between sterile gloves, and a needle is inserted into the port.  Only "non-coring" port needles should be used to access the port. Once the port is accessed, a blood return should be checked. This helps ensure that the port  is in the vein and is not clogged.  If your port needs to remain accessed for a constant infusion, a clear (transparent) bandage will be placed over the needle site. The bandage and needle will need to be changed every week, or as directed by your health care provider.  Keep the bandage covering the needle clean and dry. Do not get it wet. Follow your health care provider's instructions on how to take a shower or bath while the port is accessed.  If your port does not need to stay accessed, no bandage is needed over the port.  What is flushing? Flushing helps keep the port from getting clogged. Follow your health care provider's instructions on how and when to flush the port. Ports are usually flushed with saline solution or a medicine called heparin. The need for flushing will depend on how the port is used.  If the port is used for intermittent medicines or blood draws, the port will need to be flushed: ? After medicines have been given. ? After blood has been drawn. ? As part of routine maintenance.  If a constant infusion is running, the port may not need to be flushed.  How long will my port stay implanted? The port can stay in for as long as your health care provider thinks it is needed. When it is time for the port to come out, surgery will be   done to remove it. The procedure is similar to the one performed when the port was put in. When should I seek immediate medical care? When you have an implanted port, you should seek immediate medical care if:  You notice a bad smell coming from the incision site.  You have swelling, redness, or drainage at the incision site.  You have more swelling or pain at the port site or the surrounding area.  You have a fever that is not controlled with medicine.  This information is not intended to replace advice given to you by your health care provider. Make sure you discuss any questions you have with your health care provider. Document  Released: 06/14/2005 Document Revised: 11/20/2015 Document Reviewed: 02/19/2013 [employer] Interactive Patient Education  2017 [employer]  

## 2016-10-15 ENCOUNTER — Other Ambulatory Visit: Payer: Medicare Other

## 2016-10-15 ENCOUNTER — Ambulatory Visit: Payer: Medicare Other | Admitting: Oncology

## 2016-10-15 ENCOUNTER — Ambulatory Visit: Payer: Medicare Other

## 2016-10-19 ENCOUNTER — Ambulatory Visit (HOSPITAL_COMMUNITY)
Admission: RE | Admit: 2016-10-19 | Discharge: 2016-10-19 | Disposition: A | Discharge status: Home / Self Care | Payer: Medicare Other | Source: Ambulatory Visit | Attending: Oncology | Admitting: Oncology

## 2016-10-19 ENCOUNTER — Other Ambulatory Visit (HOSPITAL_BASED_OUTPATIENT_CLINIC_OR_DEPARTMENT_OTHER): Payer: Medicare Other

## 2016-10-19 ENCOUNTER — Ambulatory Visit (HOSPITAL_COMMUNITY): Payer: Medicare Other

## 2016-10-19 ENCOUNTER — Ambulatory Visit: Payer: Medicare Other

## 2016-10-19 ENCOUNTER — Encounter (HOSPITAL_COMMUNITY): Payer: Self-pay

## 2016-10-19 DIAGNOSIS — C7800 Secondary malignant neoplasm of unspecified lung: Secondary | ICD-10-CM | POA: Diagnosis not present

## 2016-10-19 DIAGNOSIS — E039 Hypothyroidism, unspecified: Secondary | ICD-10-CM | POA: Insufficient documentation

## 2016-10-19 DIAGNOSIS — C689 Malignant neoplasm of urinary organ, unspecified: Secondary | ICD-10-CM | POA: Insufficient documentation

## 2016-10-19 DIAGNOSIS — C7801 Secondary malignant neoplasm of right lung: Secondary | ICD-10-CM | POA: Diagnosis not present

## 2016-10-19 DIAGNOSIS — C652 Malignant neoplasm of left renal pelvis: Secondary | ICD-10-CM

## 2016-10-19 DIAGNOSIS — C7802 Secondary malignant neoplasm of left lung: Secondary | ICD-10-CM | POA: Insufficient documentation

## 2016-10-19 DIAGNOSIS — C679 Malignant neoplasm of bladder, unspecified: Secondary | ICD-10-CM | POA: Diagnosis not present

## 2016-10-19 LAB — CBC WITH DIFFERENTIAL/PLATELET
BASO%: 1.2 % (ref 0.0–2.0)
BASOS ABS: 0.1 10*3/uL (ref 0.0–0.1)
EOS ABS: 0.2 10*3/uL (ref 0.0–0.5)
EOS%: 4 % (ref 0.0–7.0)
HEMATOCRIT: 27.3 % — AB (ref 38.4–49.9)
HEMOGLOBIN: 8.6 g/dL — AB (ref 13.0–17.1)
LYMPH%: 18.9 % (ref 14.0–49.0)
MCH: 26 pg — AB (ref 27.2–33.4)
MCHC: 31.6 g/dL — ABNORMAL LOW (ref 32.0–36.0)
MCV: 82.1 fL (ref 79.3–98.0)
MONO#: 0.7 10*3/uL (ref 0.1–0.9)
MONO%: 11.6 % (ref 0.0–14.0)
NEUT%: 64.3 % (ref 39.0–75.0)
NEUTROS ABS: 3.8 10*3/uL (ref 1.5–6.5)
PLATELETS: 208 10*3/uL (ref 140–400)
RBC: 3.33 10*6/uL — ABNORMAL LOW (ref 4.20–5.82)
RDW: 17 % — ABNORMAL HIGH (ref 11.0–14.6)
WBC: 5.9 10*3/uL (ref 4.0–10.3)
lymph#: 1.1 10*3/uL (ref 0.9–3.3)

## 2016-10-19 LAB — COMPREHENSIVE METABOLIC PANEL
ALBUMIN: 3.8 g/dL (ref 3.5–5.0)
ALK PHOS: 102 U/L (ref 40–150)
ALT: 22 U/L (ref 0–55)
ANION GAP: 9 meq/L (ref 3–11)
AST: 31 U/L (ref 5–34)
BILIRUBIN TOTAL: 0.48 mg/dL (ref 0.20–1.20)
BUN: 17 mg/dL (ref 7.0–26.0)
CALCIUM: 9.3 mg/dL (ref 8.4–10.4)
CO2: 25 mEq/L (ref 22–29)
Chloride: 104 mEq/L (ref 98–109)
Creatinine: 1.5 mg/dL — ABNORMAL HIGH (ref 0.7–1.3)
EGFR: 44 mL/min/{1.73_m2} — AB (ref >90)
GLUCOSE: 149 mg/dL — AB (ref 70–140)
POTASSIUM: 4.2 meq/L (ref 3.5–5.1)
SODIUM: 139 meq/L (ref 136–145)
Total Protein: 6.9 g/dL (ref 6.4–8.3)

## 2016-10-19 LAB — TSH: TSH: 77.146 m[IU]/L — AB (ref 0.320–4.118)

## 2016-10-19 MED ORDER — IOPAMIDOL (ISOVUE-300) INJECTION 61%
INTRAVENOUS | Status: AC
  Administered 2016-10-19: 80 mL
  Filled 2016-10-19: qty 100

## 2016-10-19 MED ORDER — HEPARIN SOD (PORK) LOCK FLUSH 100 UNIT/ML IV SOLN
INTRAVENOUS | Status: AC
  Administered 2016-10-19: 500 [IU]
  Filled 2016-10-19: qty 5

## 2016-10-19 MED ORDER — SODIUM CHLORIDE 0.9 % IV SOLN
INTRAVENOUS | Status: AC
  Filled 2016-10-19: qty 250

## 2016-10-20 LAB — T4: Thyroxine (T4): 2.6 ug/dL — ABNORMAL LOW (ref 4.5–12.0)

## 2016-10-20 LAB — T4, FREE: FREE T4: 0.32 ng/dL — AB (ref 0.82–1.77)

## 2016-10-20 LAB — T3 UPTAKE
FREE THYROXINE INDEX: 0.5 — AB (ref 1.2–4.9)
T3 Uptake Ratio: 19 % — ABNORMAL LOW (ref 24–39)

## 2016-10-22 ENCOUNTER — Ambulatory Visit (HOSPITAL_BASED_OUTPATIENT_CLINIC_OR_DEPARTMENT_OTHER): Payer: Medicare Other | Admitting: Oncology

## 2016-10-22 ENCOUNTER — Telehealth: Payer: Self-pay | Admitting: Oncology

## 2016-10-22 ENCOUNTER — Ambulatory Visit (HOSPITAL_BASED_OUTPATIENT_CLINIC_OR_DEPARTMENT_OTHER): Payer: Medicare Other

## 2016-10-22 DIAGNOSIS — R319 Hematuria, unspecified: Secondary | ICD-10-CM | POA: Diagnosis not present

## 2016-10-22 DIAGNOSIS — E039 Hypothyroidism, unspecified: Secondary | ICD-10-CM

## 2016-10-22 DIAGNOSIS — C652 Malignant neoplasm of left renal pelvis: Secondary | ICD-10-CM

## 2016-10-22 DIAGNOSIS — C7800 Secondary malignant neoplasm of unspecified lung: Secondary | ICD-10-CM | POA: Diagnosis not present

## 2016-10-22 DIAGNOSIS — Z5112 Encounter for antineoplastic immunotherapy: Secondary | ICD-10-CM

## 2016-10-22 DIAGNOSIS — D649 Anemia, unspecified: Secondary | ICD-10-CM

## 2016-10-22 DIAGNOSIS — C689 Malignant neoplasm of urinary organ, unspecified: Secondary | ICD-10-CM

## 2016-10-22 DIAGNOSIS — R5383 Other fatigue: Secondary | ICD-10-CM

## 2016-10-22 MED ORDER — LEVOTHYROXINE SODIUM 25 MCG PO TABS: 25.0000 ug | ORAL_TABLET | Freq: Every day | ORAL | 1 refills | Status: DC

## 2016-10-22 MED ORDER — SODIUM CHLORIDE 0.9% FLUSH
10.0000 mL | INTRAVENOUS | Status: DC | PRN
  Administered 2016-10-22: 10 mL
  Filled 2016-10-22: qty 10

## 2016-10-22 MED ORDER — SODIUM CHLORIDE 0.9 % IV SOLN
200.0000 mg | Freq: Once | INTRAVENOUS | Status: AC
  Administered 2016-10-22: 200 mg via INTRAVENOUS
  Filled 2016-10-22: qty 8

## 2016-10-22 MED ORDER — SODIUM CHLORIDE 0.9 % IV SOLN
Freq: Once | INTRAVENOUS | Status: AC
Start: 2016-10-22 — End: 2016-10-22
  Administered 2016-10-22: 11:00:00 via INTRAVENOUS

## 2016-10-22 MED ORDER — HEPARIN SOD (PORK) LOCK FLUSH 100 UNIT/ML IV SOLN
500.0000 [IU] | Freq: Once | INTRAVENOUS | Status: AC | PRN
  Administered 2016-10-22: 500 [IU]
  Filled 2016-10-22: qty 5

## 2016-10-22 NOTE — Progress Notes (Signed)
Hematology and Oncology Follow Up Visit  [name] [name] [id number] [birth year] 71 y.o. 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 He presented with 4.9 cm mass of the upper pole of the left kidney with documented pulmonary metastasis. Neurological confirmation done on 04/15/2015 with a biopsy showed urothelial carcinoma.   Prior Therapy:   Status post biopsy of the left renal mass done on 04/15/2015. He is status post Port-A-Cath insertion on 05/21/2015. Systemic chemotherapy in the form of cisplatin and gemcitabine started on 05/27/2015. He is status post 6 cycles completed on 09/16/2015. CT scan on 01/06/2016 showed progression of disease.  Current therapy: Pembrolizumab 200 mg every 3 weeks cycle 1 on 02/13/2016. He is here for cycle 12 of therapy.  Interim History:  [name] [name] presents today for a follow-up visit. Since the last visit, he continues to do reasonably well with improvement in his health. He only reports a hematuria now with exertion and increased activity. He is exercising regularly and has noted only episodic hematuria was strenuous exercise. He denied any chest pain, palpitation or dyspnea on exertion. He does report some mild fatigue which is improving slowly.   He tolerated the last cycle of Pembrolizumab without any recent side effects. He denied any infusion-related complications. denied any nausea, abdominal pain or respiratory distress. He denied any skin rashes or lesions. He denied any constitutional symptoms.   He does not report any headaches, blurry vision, syncope or seizures. He does not report any fevers, chills, or sweats. He does not report any chest pain, palpitation, orthopnea or leg edema. He does not report any wheezing or hemoptysis. He does not report any  vomiting, abdominal pain does report occasional  dyspepsia. He does not report any frequency, urgency or hesitancy. He does not report any skeletal complaints. Remaining review of systems unremarkable.   Medications: I have reviewed the patient's current medications.  Current Outpatient Prescriptions  Medication Sig Dispense Refill  . acetaminophen (TYLENOL) 325 MG tablet Take 650 mg by mouth every 6 (six) hours as needed for moderate pain or headache.     [name] atorvastatin (LIPITOR) 40 MG tablet Take 1 tablet (40 mg total) by mouth daily. (Patient taking differently: Take 40 mg by mouth every morning. ) 90 tablet 3  . benzonatate (TESSALON) 200 MG capsule Take 1 capsule (200 mg total) by mouth 3 (three) times daily as needed for cough. 20 capsule 0  . Cholecalciferol (VITAMIN D) 1000 UNITS capsule Take 1,000 Units by mouth daily.      . diphenhydrAMINE (BENADRYL) 25 MG tablet Take 25 mg by mouth every 6 (six) hours as needed.    . docusate sodium (COLACE) 100 MG capsule Take 100 mg by mouth 2 (two) times daily as needed (constipation).    . famotidine (PEPCID) 20 MG tablet Take 1 tablet (20 mg total) by mouth daily. 30 tablet 2  . levothyroxine (SYNTHROID) 25 MCG tablet Take 1 tablet (25 mcg total) by mouth daily before breakfast. 30 tablet 1  . lidocaine-prilocaine (EMLA) cream Apply to port-a-cath 1-2 hours prior to acces. Cover with saran wrap. 30 g 1  . lisinopril (PRINIVIL,ZESTRIL) 5 MG tablet     . metoprolol tartrate (LOPRESSOR) 25 MG tablet Take 0.5 tablets (12.5 mg total) by mouth 2 (two) times daily. 90 tablet 3  . nitroGLYCERIN (NITROSTAT) 0.4 MG SL tablet Place 1 tablet (0.4 mg total)  under the tongue every 5 (five) minutes as needed. May repeat up to 3 doses. 100 tablet 3  . polycarbophil (FIBERCON) 625 MG tablet Take 625 mg by mouth 2 (two) times daily.     [name] PRESCRIPTION MEDICATION Inject 160 mg into the muscle daily. Gentamycin given for 3 days at [facility]    . sildenafil (VIAGRA) 50 MG tablet Take 50 mg by mouth as needed  for erectile dysfunction.      No current facility-administered medications for this visit.      Allergies:  Allergies  Allergen Reactions  . Hydrocodone Other (See Comments)    Became too sedated    Past Medical History, Surgical history, Social history, and Family History were reviewed and updated.   Physical Exam: Blood pressure (!) 108/57, pulse 65, temperature 97.9 F (36.6 C), temperature source Oral, resp. rate 18, height 5\' 10"  (1.778 m), weight 195 lb 9.6 oz (88.7 kg), SpO2 99 %. ECOG: 0 General appearance: Alert, awake [name] without distress. Head: Normocephalic, without obvious abnormality no oral thrush or ulcers. Neck: no adenopathy no thyroid masses. Lymph nodes: Cervical, supraclavicular, and axillary nodes normal. Heart:regular rate and rhythm, S1, S2 normal, no murmur, click, rub or gallop Lung:chest clear, no wheezing, rales, normal symmetric air entry Abdomin: soft, non-tender, without masses or organomegaly no shifting dullness or ascites. EXT:no erythema, induration, or nodules Skin: No rashes or lesions.  Lab Results: Lab Results  Component Value Date   WBC 5.9 10/19/2016   HGB 8.6 (L) 10/19/2016   HCT 27.3 (L) 10/19/2016   MCV 82.1 10/19/2016   PLT 208 10/19/2016     Chemistry      Component Value Date/Time   NA 139 10/19/2016 0919   K 4.2 10/19/2016 0919   CL 103 07/20/2016 0843   CO2 25 10/19/2016 0919   BUN 17.0 10/19/2016 0919   CREATININE 1.5 (H) 10/19/2016 0919      Component Value Date/Time   CALCIUM 9.3 10/19/2016 0919   ALKPHOS 102 10/19/2016 0919   AST 31 10/19/2016 0919   ALT 22 10/19/2016 0919   BILITOT 0.48 10/19/2016 0919      EXAM: CT CHEST, ABDOMEN, AND PELVIS WITH CONTRAST  TECHNIQUE: Multidetector CT imaging of the chest, abdomen and pelvis was performed following the standard protocol during bolus administration of intravenous contrast.  CONTRAST:  80 mL Isovue 300 IV  COMPARISON:   07/12/2016  FINDINGS: CT CHEST FINDINGS  Cardiovascular: Heart is normal in size.  No pericardial effusion.  Three vessel coronary atherosclerosis. Postsurgical changes related to prior CABG.  Mild atherosclerotic calcifications of the aortic arch.  Right chest port terminates at the cavoatrial junction.  Mediastinum/Nodes: Small mediastinal lymph nodes which do not meet pathologic CT size criteria, including an 8 mm short axis high right paratracheal node (series 2/image 8) and a 7 mm short axis subcarinal node (series 2/ image [name]).  Visualized thyroid is unremarkable.  Lungs/Pleura: 12 mm central right lower lobe pulmonary nodule (series 6/ image 89), previously 13 mm.  Additional 9 mm subpleural nodule at the lateral left lung apex (series 6/ image 18), unchanged. 4 mm subpleural nodule in the anterior left upper lobe (series 6/ image [name]), unchanged.  Mild subpleural reticulation/ fibrosis.  No focal consolidation.  No pleural effusion or pneumothorax.  Musculoskeletal: Degenerative changes of the thoracic spine. Median sternotomy.  CT ABDOMEN PELVIS FINDINGS  Hepatobiliary: 2.0 cm cyst in segment 4B (series 2/ image 52).  Gallbladder is unremarkable. No intrahepatic or  extrahepatic duct dilatation.  Pancreas: Within normal limits.  Spleen: Within normal limits.  Adrenals/Urinary Tract: Adrenal glands are within normal limits.  2.3 x 2.6 cm lesion along the left upper pole collecting system (series 2/ image 63), with associated central filling defect on delayed imaging (series 11/ image 19), compatible with known urothelial neoplasm, grossly unchanged.  Right kidney is unremarkable.  No hydronephrosis.  Bladder is mildly thick-walled but underdistended.  Stomach/Bowel: Stomach is notable for a small hiatal hernia.  No evidence of bowel obstruction.  Normal appendix (series 2/ image 94).  Vascular/Lymphatic: No evidence of  abdominal aortic aneurysm.  Atherosclerotic calcifications of the abdominal aorta and branch vessels.  Small retroperitoneal nodes, including a dominant 10 mm short axis right para-aortic node (series 2/ image 68). Prior dominant 18 mm left para-aortic node now measures 8 mm.  Reproductive: Prostate is grossly unremarkable.  Other: No abdominopelvic ascites.  Musculoskeletal: Mild degenerative changes of the visualized thoracolumbar spine.  IMPRESSION: 2.6 cm lesion along the left upper pole renal collecting system, corresponding to known urothelial neoplasm, grossly unchanged.  Small retroperitoneal nodes measuring up to 10 mm short axis, improved.  Small bilateral pulmonary nodules/metastases, including a dominant 12 mm right lower lobe nodule, mildly improved.      [name] [name] with the following issues:  1. 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 He did have a pathological confirmation done on 04/15/2015 with the specimen showed urothelial carcinoma.  He is status post 6 cycles of therapy completed in March 2017. CT scan on 04/11/2017showed continuous response to therapy. He had further decrease in his bilateral pulmonary nodules as well as decrease in the left upper pole renal lesion.  Repeat CT scan on 01/06/2016 showed mild progression of disease especially in his primary tumor. His pulmonary nodules have remained stable.   He is currently receiving Pembrolizumab and continues to tolerated very well.   CT scan on 10/19/2016 was reviewed today and showed positive response to therapy. For the most part it was stable disease with some regression of his tumor.  The plan is to continue the same dose and schedule given his excellent response to therapy. He continues to tolerate it without  any major complications.  2. Nausea and GI complication prophylaxis: No issues noted since the last visit. Antiemetics available to him at this time.  3. IV access: Port-A-Cath remains in place without complications.  4. Hematuria: Continues to be very minor and intermittent in nature.  5. Anemia: He is asymptomatic at this time. We will continue to monitor and transfuse as needed.  6. Autoimmune surveillance: He did develop hypothyroidism and currently on low-dose Synthroid. His TSH is improving and he is currently on thyroid supplement. I will make a referral to endocrinology for further management.  7. Follow-up: Will be in 3 weeks for the next cycle of therapy.  [facility], MD 4/27/201810:16 AM

## 2016-10-22 NOTE — Patient Instructions (Signed)
[facility] Discharge Instructions for Patients Receiving Chemotherapy  Today you received the following chemotherapy agents: Keytruda   To help prevent nausea and vomiting after your treatment, we encourage you to take your nausea medication as directed    If you develop nausea and vomiting that is not controlled by your nausea medication, call the clinic.   BELOW ARE SYMPTOMS THAT SHOULD BE REPORTED IMMEDIATELY:  *FEVER GREATER THAN 100.5 F  *CHILLS WITH OR WITHOUT FEVER  NAUSEA AND VOMITING THAT IS NOT CONTROLLED WITH YOUR NAUSEA MEDICATION  *UNUSUAL SHORTNESS OF BREATH  *UNUSUAL BRUISING OR BLEEDING  TENDERNESS IN MOUTH AND THROAT WITH OR WITHOUT PRESENCE OF ULCERS  *URINARY PROBLEMS  *BOWEL PROBLEMS  UNUSUAL RASH Items with * indicate a potential emergency and should be followed up as soon as possible.  Feel free to call the clinic you have any questions or concerns. The clinic phone number is [phone].  Please show the CHEMO ALERT CARD at check-in to the Emergency Department and triage nurse.   

## 2016-10-22 NOTE — Telephone Encounter (Signed)
Gave patient wife avs report and appointments for May and June.

## 2016-10-23 ENCOUNTER — Other Ambulatory Visit: Payer: Self-pay | Admitting: Oncology

## 2016-10-23 DIAGNOSIS — E039 Hypothyroidism, unspecified: Secondary | ICD-10-CM

## 2016-11-11 ENCOUNTER — Ambulatory Visit (HOSPITAL_BASED_OUTPATIENT_CLINIC_OR_DEPARTMENT_OTHER): Payer: Medicare Other | Admitting: Oncology

## 2016-11-11 ENCOUNTER — Telehealth: Payer: Self-pay | Admitting: Oncology

## 2016-11-11 ENCOUNTER — Other Ambulatory Visit (HOSPITAL_BASED_OUTPATIENT_CLINIC_OR_DEPARTMENT_OTHER): Payer: Medicare Other

## 2016-11-11 ENCOUNTER — Other Ambulatory Visit: Payer: Self-pay | Admitting: *Deleted

## 2016-11-11 ENCOUNTER — Ambulatory Visit (HOSPITAL_BASED_OUTPATIENT_CLINIC_OR_DEPARTMENT_OTHER): Payer: Medicare Other

## 2016-11-11 VITALS — BP 102/61 | HR 65 | Temp 98.3°F | Resp 18 | Ht 70.0 in | Wt 194.6 lb

## 2016-11-11 DIAGNOSIS — D649 Anemia, unspecified: Secondary | ICD-10-CM | POA: Diagnosis not present

## 2016-11-11 DIAGNOSIS — C652 Malignant neoplasm of left renal pelvis: Secondary | ICD-10-CM | POA: Diagnosis not present

## 2016-11-11 DIAGNOSIS — C689 Malignant neoplasm of urinary organ, unspecified: Secondary | ICD-10-CM

## 2016-11-11 DIAGNOSIS — L299 Pruritus, unspecified: Secondary | ICD-10-CM

## 2016-11-11 DIAGNOSIS — C7800 Secondary malignant neoplasm of unspecified lung: Secondary | ICD-10-CM

## 2016-11-11 DIAGNOSIS — R319 Hematuria, unspecified: Secondary | ICD-10-CM | POA: Diagnosis not present

## 2016-11-11 DIAGNOSIS — E039 Hypothyroidism, unspecified: Secondary | ICD-10-CM | POA: Diagnosis not present

## 2016-11-11 DIAGNOSIS — Z5112 Encounter for antineoplastic immunotherapy: Secondary | ICD-10-CM

## 2016-11-11 DIAGNOSIS — D509 Iron deficiency anemia, unspecified: Secondary | ICD-10-CM

## 2016-11-11 LAB — CBC WITH DIFFERENTIAL/PLATELET
BASO%: 1.1 % (ref 0.0–2.0)
Basophils Absolute: 0.1 10*3/uL (ref 0.0–0.1)
EOS ABS: 0.2 10*3/uL (ref 0.0–0.5)
EOS%: 4 % (ref 0.0–7.0)
HCT: 27.1 % — ABNORMAL LOW (ref 38.4–49.9)
HEMOGLOBIN: 8.6 g/dL — AB (ref 13.0–17.1)
LYMPH#: 1 10*3/uL (ref 0.9–3.3)
LYMPH%: 18.3 % (ref 14.0–49.0)
MCH: 25.8 pg — ABNORMAL LOW (ref 27.2–33.4)
MCHC: 31.8 g/dL — ABNORMAL LOW (ref 32.0–36.0)
MCV: 81.2 fL (ref 79.3–98.0)
MONO#: 0.9 10*3/uL (ref 0.1–0.9)
MONO%: 15.6 % — AB (ref 0.0–14.0)
NEUT#: 3.4 10*3/uL (ref 1.5–6.5)
NEUT%: 61 % (ref 39.0–75.0)
Platelets: 205 10*3/uL (ref 140–400)
RBC: 3.34 10*6/uL — ABNORMAL LOW (ref 4.20–5.82)
RDW: 16.8 % — AB (ref 11.0–14.6)
WBC: 5.6 10*3/uL (ref 4.0–10.3)

## 2016-11-11 LAB — IRON AND TIBC
%SAT: 13 % — ABNORMAL LOW (ref 20–55)
Iron: 41 ug/dL — ABNORMAL LOW (ref 42–163)
TIBC: 324 ug/dL (ref 202–409)
UIBC: 283 ug/dL (ref 117–376)

## 2016-11-11 LAB — COMPREHENSIVE METABOLIC PANEL
ALK PHOS: 82 U/L (ref 40–150)
ALT: 18 U/L (ref 0–55)
ANION GAP: 8 meq/L (ref 3–11)
AST: 29 U/L (ref 5–34)
Albumin: 3.8 g/dL (ref 3.5–5.0)
BILIRUBIN TOTAL: 0.73 mg/dL (ref 0.20–1.20)
BUN: 19.5 mg/dL (ref 7.0–26.0)
CALCIUM: 9.5 mg/dL (ref 8.4–10.4)
CO2: 25 mEq/L (ref 22–29)
CREATININE: 1.6 mg/dL — AB (ref 0.7–1.3)
Chloride: 103 mEq/L (ref 98–109)
EGFR: 42 mL/min/{1.73_m2} — ABNORMAL LOW (ref >90)
Glucose: 105 mg/dl (ref 70–140)
Potassium: 4.2 mEq/L (ref 3.5–5.1)
Sodium: 136 mEq/L (ref 136–145)
TOTAL PROTEIN: 6.8 g/dL (ref 6.4–8.3)

## 2016-11-11 LAB — TSH: TSH: 101.488 m(IU)/L — ABNORMAL HIGH (ref 0.320–4.118)

## 2016-11-11 LAB — FERRITIN: FERRITIN: 17 ng/mL — AB (ref 22–316)

## 2016-11-11 MED ORDER — HEPARIN SOD (PORK) LOCK FLUSH 100 UNIT/ML IV SOLN
500.0000 [IU] | Freq: Once | INTRAVENOUS | Status: AC | PRN
  Administered 2016-11-11: 500 [IU]
  Filled 2016-11-11: qty 5

## 2016-11-11 MED ORDER — SODIUM CHLORIDE 0.9% FLUSH
10.0000 mL | INTRAVENOUS | Status: DC | PRN
  Administered 2016-11-11: 10 mL
  Filled 2016-11-11: qty 10

## 2016-11-11 MED ORDER — FERROUS SULFATE 325 (65 FE) MG PO TBEC: 325.0000 mg | DELAYED_RELEASE_TABLET | Freq: Two times a day (BID) | ORAL | 3 refills | Status: DC

## 2016-11-11 MED ORDER — PEMBROLIZUMAB CHEMO INJECTION 100 MG/4ML
200.0000 mg | Freq: Once | INTRAVENOUS | Status: AC
  Administered 2016-11-11: 200 mg via INTRAVENOUS
  Filled 2016-11-11: qty 8

## 2016-11-11 MED ORDER — SODIUM CHLORIDE 0.9 % IV SOLN
Freq: Once | INTRAVENOUS | Status: AC
  Administered 2016-11-11: 11:00:00 via INTRAVENOUS

## 2016-11-11 NOTE — Progress Notes (Signed)
Per MD ok to treat with cr of 1.6  Wylene Simmer, BSN, RN 11/11/2016 11:48 AM

## 2016-11-11 NOTE — Progress Notes (Signed)
Hematology and Oncology Follow Up Visit  [name] [name] [id number] [birth year] 71 y.o. 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 He presented with 4.9 cm mass of the upper pole of the left kidney with documented pulmonary metastasis. Neurological confirmation done on 04/15/2015 with a biopsy showed urothelial carcinoma.   Prior Therapy:   Status post biopsy of the left renal mass done on 04/15/2015. He is status post Port-A-Cath insertion on 05/21/2015. Systemic chemotherapy in the form of cisplatin and gemcitabine started on 05/27/2015. He is status post 6 cycles completed on 09/16/2015. CT scan on 01/06/2016 showed progression of disease.  Current therapy: Pembrolizumab 200 mg every 3 weeks cycle 1 on 02/13/2016. He is here for cycle 13 of therapy.  Interim History:  [name] [name] presents today for a follow-up visit. Since the last visit, he continues to reports no major changes. His energy continues to improve slowly at this time. He reports very little to no hematuria at this time. He is exercising regularly and has noted only episodic hematuria was strenuous exercise. He denied any chest pain, palpitation or dyspnea on exertion.    He tolerated the last cycle of Pembrolizumab without any recent side effects. He denied any infusion-related complications. denied any nausea, abdominal pain or respiratory distress. He does report some mild pruritus without any rash.   He does not report any headaches, blurry vision, syncope or seizures. He does not report any fevers, chills, or sweats. He does not report any chest pain, palpitation, orthopnea or leg edema. He does not report any wheezing or hemoptysis. He does not report any  vomiting, abdominal pain does report occasional dyspepsia. He does not report any frequency, urgency or hesitancy. He does not  report any skeletal complaints. Remaining review of systems unremarkable.   Medications: I have reviewed the patient's current medications.  Current Outpatient Prescriptions  Medication Sig Dispense Refill  . acetaminophen (TYLENOL) 325 MG tablet Take 650 mg by mouth every 6 (six) hours as needed for moderate pain or headache.     [name] atorvastatin (LIPITOR) 40 MG tablet Take 1 tablet (40 mg total) by mouth daily. (Patient taking differently: Take 40 mg by mouth every morning. ) 90 tablet 3  . benzonatate (TESSALON) 200 MG capsule Take 1 capsule (200 mg total) by mouth 3 (three) times daily as needed for cough. 20 capsule 0  . Cholecalciferol (VITAMIN D) 1000 UNITS capsule Take 1,000 Units by mouth daily.      . diphenhydrAMINE (BENADRYL) 25 MG tablet Take 25 mg by mouth every 6 (six) hours as needed.    . docusate sodium (COLACE) 100 MG capsule Take 100 mg by mouth 2 (two) times daily as needed (constipation).    . famotidine (PEPCID) 20 MG tablet Take 1 tablet (20 mg total) by mouth daily. 30 tablet 2  . ferrous sulfate 325 (65 FE) MG EC tablet Take 1 tablet (325 mg total) by mouth 2 (two) times daily. 60 tablet 3  . levothyroxine (SYNTHROID) 25 MCG tablet Take 1 tablet (25 mcg total) by mouth daily before breakfast. 30 tablet 1  . levothyroxine (SYNTHROID, LEVOTHROID) 25 MCG tablet TAKE 1 TABLET (25 MCG TOTAL) BY MOUTH DAILY BEFORE BREAKFAST. 30 tablet 1  . lidocaine-prilocaine (EMLA) cream Apply to port-a-cath 1-2 hours prior to acces. Cover with saran wrap. 30 g 1  . lisinopril (PRINIVIL,ZESTRIL) 5 MG tablet     .  metoprolol tartrate (LOPRESSOR) 25 MG tablet Take 0.5 tablets (12.5 mg total) by mouth 2 (two) times daily. 90 tablet 3  . nitroGLYCERIN (NITROSTAT) 0.4 MG SL tablet Place 1 tablet (0.4 mg total) under the tongue every 5 (five) minutes as needed. May repeat up to 3 doses. 100 tablet 3  . polycarbophil (FIBERCON) 625 MG tablet Take 625 mg by mouth 2 (two) times daily.     [name]  PRESCRIPTION MEDICATION Inject 160 mg into the muscle daily. Gentamycin given for 3 days at [facility]    . sildenafil (VIAGRA) 50 MG tablet Take 50 mg by mouth as needed for erectile dysfunction.      No current facility-administered medications for this visit.      Allergies:  Allergies  Allergen Reactions  . Hydrocodone Other (See Comments)    Became too sedated    Past Medical History, Surgical history, Social history, and Family History were reviewed and updated.   Physical Exam: Blood pressure 102/61, pulse 65, temperature 98.3 F (36.8 C), temperature source Oral, resp. rate 18, height 5\' 10"  (1.778 m), weight 194 lb 9.6 oz (88.3 kg), SpO2 97 %. ECOG: 0 General appearance: Pleasant-appearing gentleman appeared without distress. Head: Normocephalic, without obvious abnormality no oral thrush or ulcers. Neck: no adenopathy no thyroid masses. Lymph nodes: Cervical, supraclavicular, and axillary nodes normal. Heart:regular rate and rhythm, S1, S2 normal, no murmur, click, rub or gallop Lung:chest clear, no wheezing, rales, normal symmetric air entry Abdomin: soft, non-tender, without masses or organomegaly no rebound or guarding. EXT:no erythema, induration, or nodules Skin: No rashes or lesions.  Lab Results: Lab Results  Component Value Date   WBC 5.6 11/11/2016   HGB 8.6 (L) 11/11/2016   HCT 27.1 (L) 11/11/2016   MCV 81.2 11/11/2016   PLT 205 11/11/2016     Chemistry      Component Value Date/Time   NA 139 10/19/2016 0919   K 4.2 10/19/2016 0919   CL 103 07/20/2016 0843   CO2 25 10/19/2016 0919   BUN 17.0 10/19/2016 0919   CREATININE 1.5 (H) 10/19/2016 0919      Component Value Date/Time   CALCIUM 9.3 10/19/2016 0919   ALKPHOS 102 10/19/2016 0919   AST 31 10/19/2016 0919   ALT 22 10/19/2016 0919   BILITOT 0.48 10/19/2016 0914         71 year old gentleman with the following issues:  1. Transitional cell carcinoma of the left genitourinary  tract presented with a 4.9 cm tumor in the upper pole of the left kidney and documented pulmonary metastasis based on a PET CT scan obtained on 05/06/2015. He did have a pathological confirmation done on 04/15/2015 with the specimen showed urothelial carcinoma.  He is status post 6 cycles of therapy completed in March 2017. CT scan on 04/11/2017showed continuous response to therapy. He had further decrease in his bilateral pulmonary nodules as well as decrease in the left upper pole renal lesion.  Repeat CT scan on 01/06/2016 showed mild progression of disease especially in his primary tumor. His pulmonary nodules have remained stable.   He is currently receiving Pembrolizumab and continues to tolerated very well.   CT scan on 10/19/2016 showed positive response to therapy.   Risks and benefits of continuing this treatment were reviewed today and he is agreeable to continue. The plan is to repeat imaging studies in 3 months.  2. Nausea and GI complication prophylaxis: No issues noted since the last visit. Antiemetics available to him at this  time.  3. IV access: Port-A-Cath remains in place without complications.  4. Hematuria: Improved at this time and nearly resolved.  5. Anemia: He is asymptomatic at this time. His hemoglobin remains stable although is not improving. I will check iron studies today and start him on iron replacement. He will start iron sulfate twice a day with instructions how to use it. I gave him instructions to use stool softeners to alleviate some of the constipation associated with oral iron. Intravenous iron can be used if he is intolerant to oral iron.  6. Autoimmune surveillance: He did develop hypothyroidism and currently on low-dose Synthroid. His TSH is improving and he is currently on thyroid supplement.  7. Follow-up: Will be in 3 weeks for the next cycle of therapy.  [facility], MD 5/17/201810:25 AM

## 2016-11-11 NOTE — Patient Instructions (Signed)
[facility] Discharge Instructions for Patients Receiving Chemotherapy  Today you received the following chemotherapy agents: Keytruda   To help prevent nausea and vomiting after your treatment, we encourage you to take your nausea medication as directed    If you develop nausea and vomiting that is not controlled by your nausea medication, call the clinic.   BELOW ARE SYMPTOMS THAT SHOULD BE REPORTED IMMEDIATELY:  *FEVER GREATER THAN 100.5 F  *CHILLS WITH OR WITHOUT FEVER  NAUSEA AND VOMITING THAT IS NOT CONTROLLED WITH YOUR NAUSEA MEDICATION  *UNUSUAL SHORTNESS OF BREATH  *UNUSUAL BRUISING OR BLEEDING  TENDERNESS IN MOUTH AND THROAT WITH OR WITHOUT PRESENCE OF ULCERS  *URINARY PROBLEMS  *BOWEL PROBLEMS  UNUSUAL RASH Items with * indicate a potential emergency and should be followed up as soon as possible.  Feel free to call the clinic you have any questions or concerns. The clinic phone number is [phone].  Please show the CHEMO ALERT CARD at check-in to the Emergency Department and triage nurse.   

## 2016-11-11 NOTE — Telephone Encounter (Signed)
Gave patient AVS and calender per 5/17 los.  

## 2016-12-02 ENCOUNTER — Ambulatory Visit: Payer: Medicare Other

## 2016-12-02 ENCOUNTER — Other Ambulatory Visit (HOSPITAL_BASED_OUTPATIENT_CLINIC_OR_DEPARTMENT_OTHER): Payer: Medicare Other

## 2016-12-02 ENCOUNTER — Telehealth: Payer: Self-pay | Admitting: Oncology

## 2016-12-02 ENCOUNTER — Ambulatory Visit (HOSPITAL_BASED_OUTPATIENT_CLINIC_OR_DEPARTMENT_OTHER): Payer: Medicare Other

## 2016-12-02 ENCOUNTER — Ambulatory Visit (HOSPITAL_BASED_OUTPATIENT_CLINIC_OR_DEPARTMENT_OTHER): Payer: Medicare Other | Admitting: Oncology

## 2016-12-02 VITALS — BP 119/74 | HR 68 | Temp 98.1°F | Resp 18 | Ht 70.0 in | Wt 194.9 lb

## 2016-12-02 DIAGNOSIS — R319 Hematuria, unspecified: Secondary | ICD-10-CM

## 2016-12-02 DIAGNOSIS — C652 Malignant neoplasm of left renal pelvis: Secondary | ICD-10-CM

## 2016-12-02 DIAGNOSIS — C689 Malignant neoplasm of urinary organ, unspecified: Secondary | ICD-10-CM

## 2016-12-02 DIAGNOSIS — K5900 Constipation, unspecified: Secondary | ICD-10-CM

## 2016-12-02 DIAGNOSIS — D649 Anemia, unspecified: Secondary | ICD-10-CM

## 2016-12-02 DIAGNOSIS — C7800 Secondary malignant neoplasm of unspecified lung: Secondary | ICD-10-CM | POA: Diagnosis not present

## 2016-12-02 DIAGNOSIS — E039 Hypothyroidism, unspecified: Secondary | ICD-10-CM

## 2016-12-02 DIAGNOSIS — Z5111 Encounter for antineoplastic chemotherapy: Secondary | ICD-10-CM | POA: Diagnosis not present

## 2016-12-02 DIAGNOSIS — L299 Pruritus, unspecified: Secondary | ICD-10-CM

## 2016-12-02 DIAGNOSIS — Z95828 Presence of other vascular implants and grafts: Secondary | ICD-10-CM

## 2016-12-02 LAB — COMPREHENSIVE METABOLIC PANEL
ALK PHOS: 75 U/L (ref 40–150)
ALT: 15 U/L (ref 0–55)
ANION GAP: 9 meq/L (ref 3–11)
AST: 23 U/L (ref 5–34)
Albumin: 3.5 g/dL (ref 3.5–5.0)
BILIRUBIN TOTAL: 0.67 mg/dL (ref 0.20–1.20)
BUN: 22.1 mg/dL (ref 7.0–26.0)
CALCIUM: 9.4 mg/dL (ref 8.4–10.4)
CO2: 25 mEq/L (ref 22–29)
Chloride: 102 mEq/L (ref 98–109)
Creatinine: 1.6 mg/dL — ABNORMAL HIGH (ref 0.7–1.3)
EGFR: 43 mL/min/{1.73_m2} — AB (ref >90)
Glucose: 157 mg/dl — ABNORMAL HIGH (ref 70–140)
Potassium: 4 mEq/L (ref 3.5–5.1)
Sodium: 136 mEq/L (ref 136–145)
TOTAL PROTEIN: 6.7 g/dL (ref 6.4–8.3)

## 2016-12-02 LAB — CBC WITH DIFFERENTIAL/PLATELET
BASO%: 0.4 % (ref 0.0–2.0)
Basophils Absolute: 0 10*3/uL (ref 0.0–0.1)
EOS ABS: 0.2 10*3/uL (ref 0.0–0.5)
EOS%: 2.7 % (ref 0.0–7.0)
HEMATOCRIT: 31.5 % — AB (ref 38.4–49.9)
HGB: 9.5 g/dL — ABNORMAL LOW (ref 13.0–17.1)
LYMPH#: 0.9 10*3/uL (ref 0.9–3.3)
LYMPH%: 13.3 % — AB (ref 14.0–49.0)
MCH: 26.8 pg — ABNORMAL LOW (ref 27.2–33.4)
MCHC: 30.2 g/dL — ABNORMAL LOW (ref 32.0–36.0)
MCV: 89 fL (ref 79.3–98.0)
MONO#: 0.5 10*3/uL (ref 0.1–0.9)
MONO%: 7.6 % (ref 0.0–14.0)
NEUT#: 5.3 10*3/uL (ref 1.5–6.5)
NEUT%: 76 % — ABNORMAL HIGH (ref 39.0–75.0)
PLATELETS: 202 10*3/uL (ref 140–400)
RBC: 3.54 10*6/uL — AB (ref 4.20–5.82)
RDW: 20.1 % — ABNORMAL HIGH (ref 11.0–14.6)
WBC: 7 10*3/uL (ref 4.0–10.3)

## 2016-12-02 MED ORDER — SODIUM CHLORIDE 0.9 % IV SOLN
Freq: Once | INTRAVENOUS | Status: AC
  Administered 2016-12-02: 10:00:00 via INTRAVENOUS

## 2016-12-02 MED ORDER — HEPARIN SOD (PORK) LOCK FLUSH 100 UNIT/ML IV SOLN
500.0000 [IU] | Freq: Once | INTRAVENOUS | Status: AC | PRN
  Administered 2016-12-02: 500 [IU]
  Filled 2016-12-02: qty 5

## 2016-12-02 MED ORDER — SODIUM CHLORIDE 0.9 % IJ SOLN
10.0000 mL | INTRAMUSCULAR | Status: DC | PRN
  Administered 2016-12-02: 10 mL via INTRAVENOUS
  Filled 2016-12-02: qty 10

## 2016-12-02 MED ORDER — PEMBROLIZUMAB CHEMO INJECTION 100 MG/4ML
200.0000 mg | Freq: Once | INTRAVENOUS | Status: AC
  Administered 2016-12-02: 200 mg via INTRAVENOUS
  Filled 2016-12-02: qty 8

## 2016-12-02 MED ORDER — SODIUM CHLORIDE 0.9% FLUSH
10.0000 mL | INTRAVENOUS | Status: DC | PRN
  Administered 2016-12-02: 10 mL
  Filled 2016-12-02: qty 10

## 2016-12-02 NOTE — Patient Instructions (Signed)

## 2016-12-02 NOTE — Progress Notes (Signed)
Per dr [name], Faythe Ghee to treat with current labs.

## 2016-12-02 NOTE — Telephone Encounter (Signed)
Gave patient AVS and calender per 6/7 los . 

## 2016-12-02 NOTE — Patient Instructions (Signed)
[facility] Discharge Instructions for Patients Receiving Chemotherapy  Today you received the following chemotherapy agents: Keytruda   To help prevent nausea and vomiting after your treatment, we encourage you to take your nausea medication as directed    If you develop nausea and vomiting that is not controlled by your nausea medication, call the clinic.   BELOW ARE SYMPTOMS THAT SHOULD BE REPORTED IMMEDIATELY:  *FEVER GREATER THAN 100.5 F  *CHILLS WITH OR WITHOUT FEVER  NAUSEA AND VOMITING THAT IS NOT CONTROLLED WITH YOUR NAUSEA MEDICATION  *UNUSUAL SHORTNESS OF BREATH  *UNUSUAL BRUISING OR BLEEDING  TENDERNESS IN MOUTH AND THROAT WITH OR WITHOUT PRESENCE OF ULCERS  *URINARY PROBLEMS  *BOWEL PROBLEMS  UNUSUAL RASH Items with * indicate a potential emergency and should be followed up as soon as possible.  Feel free to call the clinic you have any questions or concerns. The clinic phone number is [phone].  Please show the CHEMO ALERT CARD at check-in to the Emergency Department and triage nurse.   

## 2016-12-02 NOTE — Progress Notes (Signed)
Hematology and Oncology Follow Up Visit  [name] [name] [id number] 17-Mar-1946 71 y.o. 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 He presented with 4.9 cm mass of the upper pole of the left kidney with documented pulmonary metastasis. Neurological confirmation done on 04/15/2015 with a biopsy showed urothelial carcinoma.   Prior Therapy:   Status post biopsy of the left renal mass done on 04/15/2015. He is status post Port-A-Cath insertion on 05/21/2015. Systemic chemotherapy in the form of cisplatin and gemcitabine started on 05/27/2015. He is status post 6 cycles completed on 09/16/2015. CT scan on 01/06/2016 showed progression of disease.  Current therapy: Pembrolizumab 200 mg every 3 weeks cycle 1 on 02/13/2016. He is here for cycle 14 of therapy.  Interim History:  Mr. [name] [name] presents today for a follow-up visit. Since the last visit, he reports improvement in his overall energy and health. He tolerates oral iron therapy without recent complications. He does report mild constipation but otherwise no dyspepsia or abdominal pain. He is exercising regularly and has noted only episodic hematuria was strenuous exercise. He denied any chest pain, palpitation or dyspnea on exertion.    He tolerated the last cycle of Pembrolizumab without any recent side effects. He continues to have issues with pruritus but no rash. He continues to be on Synthroid without complications. He continues to report cold intolerance.   He does not report any headaches, blurry vision, syncope or seizures. He does not report any fevers, chills, or sweats. He does not report any chest pain, palpitation, orthopnea or leg edema. He does not report any wheezing or hemoptysis. He does not report any  vomiting, abdominal pain does report occasional dyspepsia. He does not report any  frequency, urgency or hesitancy. He does not report any skeletal complaints. Remaining review of systems unremarkable.   Medications: I have reviewed the patient's current medications.  Current Outpatient Prescriptions  Medication Sig Dispense Refill  . acetaminophen (TYLENOL) 325 MG tablet Take 650 mg by mouth every 6 (six) hours as needed for moderate pain or headache.     [name] atorvastatin (LIPITOR) 40 MG tablet Take 1 tablet (40 mg total) by mouth daily. (Patient taking differently: Take 40 mg by mouth every morning. ) 90 tablet 3  . benzonatate (TESSALON) 200 MG capsule Take 1 capsule (200 mg total) by mouth 3 (three) times daily as needed for cough. 20 capsule 0  . Cholecalciferol (VITAMIN D) 1000 UNITS capsule Take 1,000 Units by mouth daily.      . diphenhydrAMINE (BENADRYL) 25 MG tablet Take 25 mg by mouth every 6 (six) hours as needed.    . docusate sodium (COLACE) 100 MG capsule Take 100 mg by mouth 2 (two) times daily as needed (constipation).    . famotidine (PEPCID) 20 MG tablet Take 1 tablet (20 mg total) by mouth daily. 30 tablet 2  . ferrous sulfate 325 (65 FE) MG EC tablet Take 1 tablet (325 mg total) by mouth 2 (two) times daily. 60 tablet 3  . levothyroxine (SYNTHROID) 25 MCG tablet Take 1 tablet (25 mcg total) by mouth daily before breakfast. 30 tablet 1  . levothyroxine (SYNTHROID, LEVOTHROID) 25 MCG tablet TAKE 1 TABLET (25 MCG TOTAL) BY MOUTH DAILY BEFORE BREAKFAST. 30 tablet 1  . lidocaine-prilocaine (EMLA) cream Apply to port-a-cath 1-2 hours prior to acces. Cover with saran wrap. 30 g 1  .  lisinopril (PRINIVIL,ZESTRIL) 5 MG tablet     . metoprolol tartrate (LOPRESSOR) 25 MG tablet Take 0.5 tablets (12.5 mg total) by mouth 2 (two) times daily. 90 tablet 3  . nitroGLYCERIN (NITROSTAT) 0.4 MG SL tablet Place 1 tablet (0.4 mg total) under the tongue every 5 (five) minutes as needed. May repeat up to 3 doses. 100 tablet 3  . polycarbophil (FIBERCON) 625 MG tablet Take 625 mg by  mouth 2 (two) times daily.     [name] PRESCRIPTION MEDICATION Inject 160 mg into the muscle daily. Gentamycin given for 3 days at [facility]    . sildenafil (VIAGRA) 50 MG tablet Take 50 mg by mouth as needed for erectile dysfunction.      No current facility-administered medications for this visit.      Allergies:  Allergies  Allergen Reactions  . Hydrocodone Other (See Comments)    Became too sedated    Past Medical History, Surgical history, Social history, and Family History were reviewed and updated.   Physical Exam: Blood pressure 119/74, pulse 68, temperature 98.1 F (36.7 C), temperature source Oral, resp. rate 18, height 5\' 10"  (1.778 m), weight 194 lb 14.4 oz (88.4 kg), SpO2 100 %. ECOG: 0 General appearance: Alert, awake gentleman without distress. Head: Normocephalic, without obvious abnormality no oral ulcers or thrush. Neck: no adenopathy no thyroid masses. Lymph nodes: Cervical, supraclavicular, and axillary nodes normal. Heart:regular rate and rhythm, S1, S2 normal, no murmur, click, rub or gallop Lung:chest clear, no wheezing, rales, normal symmetric air entry Abdomin: soft, non-tender, without masses or organomegaly no shifting dullness or ascites. EXT:no erythema, induration, or nodules Skin: No rashes or lesions.  Lab Results: Lab Results  Component Value Date   WBC 7.0 12/02/2016   HGB 9.5 (L) 12/02/2016   HCT 31.5 (L) 12/02/2016   MCV 89.0 12/02/2016   PLT 202 12/02/2016     Chemistry      Component Value Date/Time   NA 136 11/11/2016 0952   K 4.2 11/11/2016 0952   CL 103 07/20/2016 0843   CO2 25 11/11/2016 0952   BUN 19.5 11/11/2016 0952   CREATININE 1.6 (H) 11/11/2016 0952      Component Value Date/Time   CALCIUM 9.5 11/11/2016 0952   ALKPHOS 82 11/11/2016 0952   AST 29 11/11/2016 0952   ALT 18 11/11/2016 0952   BILITOT 0.73 11/11/2016 09554         71 year old gentleman with the following issues:  1. Transitional cell  carcinoma of the left genitourinary tract presented with a 4.9 cm tumor in the upper pole of the left kidney and documented pulmonary metastasis based on a PET CT scan obtained on 05/06/2015. He did have a pathological confirmation done on 04/15/2015 with the specimen showed urothelial carcinoma.  He is status post 6 cycles of therapy completed in March 2017. CT scan on 04/11/2017showed continuous response to therapy. He had further decrease in his bilateral pulmonary nodules as well as decrease in the left upper pole renal lesion.  Repeat CT scan on 01/06/2016 showed mild progression of disease especially in his primary tumor. His pulmonary nodules have remained stable.   He is currently receiving Pembrolizumab and continues to tolerated very well.   CT scan on 10/19/2016 showed positive response to therapy.   The plan is to proceed with therapy today as scheduled I will repeat imaging studies in July 2018.  2. Nausea and GI complication prophylaxis: None reported since the last visit.  3. IV access:  Port-A-Cath remains in place without complications.  4. Hematuria: Improved at this time and nearly resolved.  5. Anemia: Hemoglobin improves with iron supplement.  6. Autoimmune surveillance: He did develop hypothyroidism and currently on low-dose Synthroid. His TSH is improving and he is currently on thyroid supplement. Referral to endocrinology is pending and we will resend his referral at this time.  7. Follow-up: Will be in 3 weeks for the next cycle of therapy.  [facility], MD 6/7/20189:21 AM

## 2016-12-22 ENCOUNTER — Telehealth: Payer: Self-pay | Admitting: Oncology

## 2016-12-22 ENCOUNTER — Other Ambulatory Visit: Payer: Self-pay | Admitting: *Deleted

## 2016-12-22 ENCOUNTER — Ambulatory Visit: Payer: Medicare Other

## 2016-12-22 ENCOUNTER — Other Ambulatory Visit (HOSPITAL_BASED_OUTPATIENT_CLINIC_OR_DEPARTMENT_OTHER): Payer: Medicare Other

## 2016-12-22 ENCOUNTER — Ambulatory Visit (HOSPITAL_BASED_OUTPATIENT_CLINIC_OR_DEPARTMENT_OTHER): Payer: Medicare Other

## 2016-12-22 ENCOUNTER — Ambulatory Visit (HOSPITAL_BASED_OUTPATIENT_CLINIC_OR_DEPARTMENT_OTHER): Payer: Medicare Other | Admitting: Oncology

## 2016-12-22 ENCOUNTER — Telehealth: Payer: Self-pay | Admitting: *Deleted

## 2016-12-22 VITALS — BP 114/62 | HR 64 | Temp 98.0°F | Resp 18 | Ht 70.0 in | Wt 191.4 lb

## 2016-12-22 DIAGNOSIS — C642 Malignant neoplasm of left kidney, except renal pelvis: Secondary | ICD-10-CM

## 2016-12-22 DIAGNOSIS — Z79899 Other long term (current) drug therapy: Secondary | ICD-10-CM | POA: Diagnosis not present

## 2016-12-22 DIAGNOSIS — E039 Hypothyroidism, unspecified: Secondary | ICD-10-CM | POA: Diagnosis not present

## 2016-12-22 DIAGNOSIS — Z5112 Encounter for antineoplastic immunotherapy: Secondary | ICD-10-CM | POA: Diagnosis not present

## 2016-12-22 DIAGNOSIS — L299 Pruritus, unspecified: Secondary | ICD-10-CM | POA: Diagnosis not present

## 2016-12-22 DIAGNOSIS — Z95828 Presence of other vascular implants and grafts: Secondary | ICD-10-CM

## 2016-12-22 DIAGNOSIS — C689 Malignant neoplasm of urinary organ, unspecified: Secondary | ICD-10-CM

## 2016-12-22 DIAGNOSIS — C7800 Secondary malignant neoplasm of unspecified lung: Secondary | ICD-10-CM

## 2016-12-22 DIAGNOSIS — R319 Hematuria, unspecified: Secondary | ICD-10-CM

## 2016-12-22 DIAGNOSIS — D649 Anemia, unspecified: Secondary | ICD-10-CM | POA: Diagnosis not present

## 2016-12-22 LAB — CBC WITH DIFFERENTIAL/PLATELET
BASO%: 0.4 % (ref 0.0–2.0)
BASOS ABS: 0 10*3/uL (ref 0.0–0.1)
EOS%: 2.9 % (ref 0.0–7.0)
Eosinophils Absolute: 0.2 10*3/uL (ref 0.0–0.5)
HEMATOCRIT: 32.4 % — AB (ref 38.4–49.9)
HEMOGLOBIN: 9.9 g/dL — AB (ref 13.0–17.1)
LYMPH#: 1.1 10*3/uL (ref 0.9–3.3)
LYMPH%: 14.6 % (ref 14.0–49.0)
MCH: 27.9 pg (ref 27.2–33.4)
MCHC: 30.6 g/dL — ABNORMAL LOW (ref 32.0–36.0)
MCV: 91.3 fL (ref 79.3–98.0)
MONO#: 1.1 10*3/uL — ABNORMAL HIGH (ref 0.1–0.9)
MONO%: 13.9 % (ref 0.0–14.0)
NEUT%: 68.2 % (ref 39.0–75.0)
NEUTROS ABS: 5.1 10*3/uL (ref 1.5–6.5)
Platelets: 187 10*3/uL (ref 140–400)
RBC: 3.55 10*6/uL — ABNORMAL LOW (ref 4.20–5.82)
RDW: 20.7 % — AB (ref 11.0–14.6)
WBC: 7.5 10*3/uL (ref 4.0–10.3)

## 2016-12-22 LAB — COMPREHENSIVE METABOLIC PANEL
ALBUMIN: 3.4 g/dL — AB (ref 3.5–5.0)
ALK PHOS: 69 U/L (ref 40–150)
ALT: 14 U/L (ref 0–55)
AST: 22 U/L (ref 5–34)
Anion Gap: 9 mEq/L (ref 3–11)
BUN: 17.1 mg/dL (ref 7.0–26.0)
CALCIUM: 9.7 mg/dL (ref 8.4–10.4)
CO2: 27 mEq/L (ref 22–29)
CREATININE: 1.5 mg/dL — AB (ref 0.7–1.3)
Chloride: 101 mEq/L (ref 98–109)
EGFR: 47 mL/min/{1.73_m2} — ABNORMAL LOW (ref >90)
Glucose: 129 mg/dl (ref 70–140)
Potassium: 4.1 mEq/L (ref 3.5–5.1)
Sodium: 137 mEq/L (ref 136–145)
TOTAL PROTEIN: 6.8 g/dL (ref 6.4–8.3)
Total Bilirubin: 0.48 mg/dL (ref 0.20–1.20)

## 2016-12-22 LAB — TSH: TSH: 97.249 m[IU]/L — AB (ref 0.320–4.118)

## 2016-12-22 MED ORDER — SODIUM CHLORIDE 0.9% FLUSH
10.0000 mL | INTRAVENOUS | Status: DC | PRN
  Administered 2016-12-22: 10 mL
  Filled 2016-12-22: qty 10

## 2016-12-22 MED ORDER — SODIUM CHLORIDE 0.9 % IJ SOLN
10.0000 mL | INTRAMUSCULAR | Status: DC | PRN
  Administered 2016-12-22: 10 mL via INTRAVENOUS
  Filled 2016-12-22: qty 10

## 2016-12-22 MED ORDER — HEPARIN SOD (PORK) LOCK FLUSH 100 UNIT/ML IV SOLN
500.0000 [IU] | Freq: Once | INTRAVENOUS | Status: AC | PRN
  Administered 2016-12-22: 500 [IU]
  Filled 2016-12-22: qty 5

## 2016-12-22 MED ORDER — PEMBROLIZUMAB CHEMO INJECTION 100 MG/4ML
200.0000 mg | Freq: Once | INTRAVENOUS | Status: AC
  Administered 2016-12-22: 200 mg via INTRAVENOUS
  Filled 2016-12-22: qty 8

## 2016-12-22 MED ORDER — LEVOTHYROXINE SODIUM 25 MCG PO TABS: 25.0000 ug | ORAL_TABLET | Freq: Every day | ORAL | 1 refills | Status: DC

## 2016-12-22 MED ORDER — SODIUM CHLORIDE 0.9 % IV SOLN
Freq: Once | INTRAVENOUS | Status: AC
  Administered 2016-12-22: 11:00:00 via INTRAVENOUS

## 2016-12-22 NOTE — Patient Instructions (Signed)
[facility] Discharge Instructions for Patients Receiving Chemotherapy  Today you received the following chemotherapy agents: Keytruda   To help prevent nausea and vomiting after your treatment, we encourage you to take your nausea medication as directed    If you develop nausea and vomiting that is not controlled by your nausea medication, call the clinic.   BELOW ARE SYMPTOMS THAT SHOULD BE REPORTED IMMEDIATELY:  *FEVER GREATER THAN 100.5 F  *CHILLS WITH OR WITHOUT FEVER  NAUSEA AND VOMITING THAT IS NOT CONTROLLED WITH YOUR NAUSEA MEDICATION  *UNUSUAL SHORTNESS OF BREATH  *UNUSUAL BRUISING OR BLEEDING  TENDERNESS IN MOUTH AND THROAT WITH OR WITHOUT PRESENCE OF ULCERS  *URINARY PROBLEMS  *BOWEL PROBLEMS  UNUSUAL RASH Items with * indicate a potential emergency and should be followed up as soon as possible.  Feel free to call the clinic you have any questions or concerns. The clinic phone number is [phone].  Please show the CHEMO ALERT CARD at check-in to the Emergency Department and triage nurse.   

## 2016-12-22 NOTE — Addendum Note (Signed)
Addended by: Wyatt Portela on: 12/22/2016 10:49 AM   Modules accepted: Orders

## 2016-12-22 NOTE — Telephone Encounter (Signed)
Scheduled appt per 6/27 los - Gave patient AVS and calender per los . Central radiology to contact patient with ct schedule.

## 2016-12-22 NOTE — Patient Instructions (Signed)

## 2016-12-22 NOTE — Telephone Encounter (Signed)
Faxed chart along with referral sheet to dr. Cindra Eves office for hypothyroidism. [phone]

## 2016-12-22 NOTE — Progress Notes (Signed)
Hematology and Oncology Follow Up Visit  [name] [name] [id number] 08/23/1945 71 y.o. 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 He presented with 4.9 cm mass of the upper pole of the left kidney with documented pulmonary metastasis. Neurological confirmation done on 04/15/2015 with a biopsy showed urothelial carcinoma.   Prior Therapy:   Status post biopsy of the left renal mass done on 04/15/2015. He is status post Port-A-Cath insertion on 05/21/2015. Systemic chemotherapy in the form of cisplatin and gemcitabine started on 05/27/2015. He is status post 6 cycles completed on 09/16/2015. CT scan on 01/06/2016 showed progression of disease.  Current therapy: Pembrolizumab 200 mg every 3 weeks cycle 1 on 02/13/2016. He is here for the next cycle of therapy.  Interim History:  [name] [name] presents today for a follow-up visit. Since the last visit, he reports continuous improvement in his overall energy and performance status. improvement in his overall energy and health. He tolerates oral iron therapy without recent complications. Although he is taking it once a day because of mild dyspepsia. He does not report any constipation or diarrhea. He does not report any abdominal pain. His performance status and activity level remains about the same slightly improved.   He tolerated the last cycle of Pembrolizumab without any recent side effects. He continues to have issues with pruritus without any rashes. He denied any respiratory symptoms including dyspnea exertion or cough.   He does not report any headaches, blurry vision, syncope or seizures. He does not report any fevers, chills, or sweats. He does not report any chest pain, palpitation, orthopnea or leg edema. He does not report any wheezing or hemoptysis. He does not report any  vomiting, abdominal  pain does report occasional dyspepsia. He does not report any frequency, urgency or hesitancy. He does not report any skeletal complaints. Remaining review of systems unremarkable.   Medications: I have reviewed the patient's current medications.  Current Outpatient Prescriptions  Medication Sig Dispense Refill  . acetaminophen (TYLENOL) 325 MG tablet Take 650 mg by mouth every 6 (six) hours as needed for moderate pain or headache.     [name] atorvastatin (LIPITOR) 40 MG tablet Take 1 tablet (40 mg total) by mouth daily. (Patient taking differently: Take 40 mg by mouth every morning. ) 90 tablet 3  . benzonatate (TESSALON) 200 MG capsule Take 1 capsule (200 mg total) by mouth 3 (three) times daily as needed for cough. 20 capsule 0  . Cholecalciferol (VITAMIN D) 1000 UNITS capsule Take 1,000 Units by mouth daily.      . diphenhydrAMINE (BENADRYL) 25 MG tablet Take 25 mg by mouth every 6 (six) hours as needed.    . docusate sodium (COLACE) 100 MG capsule Take 100 mg by mouth 2 (two) times daily as needed (constipation).    . famotidine (PEPCID) 20 MG tablet Take 1 tablet (20 mg total) by mouth daily. 30 tablet 2  . ferrous sulfate 325 (65 FE) MG EC tablet Take 1 tablet (325 mg total) by mouth 2 (two) times daily. 60 tablet 3  . levothyroxine (SYNTHROID) 25 MCG tablet Take 1 tablet (25 mcg total) by mouth daily before breakfast. 30 tablet 1  . levothyroxine (SYNTHROID, LEVOTHROID) 25 MCG tablet TAKE 1 TABLET (25 MCG TOTAL) BY MOUTH DAILY BEFORE BREAKFAST. 30 tablet 1  . lidocaine-prilocaine (EMLA) cream Apply to port-a-cath 1-2 hours prior to acces.  Cover with saran wrap. 30 g 1  . lisinopril (PRINIVIL,ZESTRIL) 5 MG tablet     . metoprolol tartrate (LOPRESSOR) 25 MG tablet Take 0.5 tablets (12.5 mg total) by mouth 2 (two) times daily. 90 tablet 3  . nitroGLYCERIN (NITROSTAT) 0.4 MG SL tablet Place 1 tablet (0.4 mg total) under the tongue every 5 (five) minutes as needed. May repeat up to 3 doses. 100  tablet 3  . polycarbophil (FIBERCON) 625 MG tablet Take 625 mg by mouth 2 (two) times daily.     [name] PRESCRIPTION MEDICATION Inject 160 mg into the muscle daily. Gentamycin given for 3 days at [facility]    . sildenafil (VIAGRA) 50 MG tablet Take 50 mg by mouth as needed for erectile dysfunction.      No current facility-administered medications for this visit.      Allergies:  Allergies  Allergen Reactions  . Hydrocodone Other (See Comments)    Became too sedated    Past Medical History, Surgical history, Social history, and Family History were reviewed and updated.   Physical Exam: Blood pressure 114/62, pulse 64, temperature 98 F (36.7 C), temperature source Oral, resp. rate 18, height 5\' 10"  (1.778 m), weight 191 lb 6.4 oz (86.8 kg), SpO2 100 %. ECOG: 0 General appearance: Well-appearing gentleman without distress. Head: Normocephalic, without obvious abnormality no oral thrush or ulcers. Neck: no adenopathy no thyroid masses. Lymph nodes: Cervical, supraclavicular, and axillary nodes normal. Heart:regular rate and rhythm, S1, S2 normal, no murmur, click, rub or gallop Lung:chest clear, no wheezing, rales, normal symmetric air entry Abdomin: soft, non-tender, without masses or organomegaly no rebound or guarding. EXT:no erythema, induration, or nodules Skin: No rashes or lesions.  Lab Results: Lab Results  Component Value Date   WBC 7.5 12/22/2016   HGB 9.9 (L) 12/22/2016   HCT 32.4 (L) 12/22/2016   MCV 91.3 12/22/2016   PLT 187 12/22/2016     Chemistry      Component Value Date/Time   NA 136 12/02/2016 0849   K 4.0 12/02/2016 0849   CL 103 07/20/2016 0843   CO2 25 12/02/2016 0849   BUN 22.1 12/02/2016 0849   CREATININE 1.6 (H) 12/02/2016 0849      Component Value Date/Time   CALCIUM 9.4 12/02/2016 0849   ALKPHOS 75 12/02/2016 0849   AST 23 12/02/2016 0849   ALT 15 12/02/2016 0849   BILITOT 0.67 12/02/2016 08478         71 year old gentleman  with the following issues:  1. 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 He did have a pathological confirmation done on 04/15/2015 with the specimen showed urothelial carcinoma.  He is status post 6 cycles of therapy completed in March 2017. CT scan on 04/11/2017showed continuous response to therapy. He had further decrease in his bilateral pulmonary nodules as well as decrease in the left upper pole renal lesion.  Repeat CT scan on 01/06/2016 showed mild progression of disease especially in his primary tumor. His pulmonary nodules have remained stable.   He is currently receiving Pembrolizumab and continues to tolerated very well.   The plan is to continue the same dose and schedule and repeat imaging studies in July 2018. His last CT scan in April 2018 showed response to therapy.  2. Nausea and GI complication prophylaxis: None reported since the  last visit.  3. IV access: Port-A-Cath remains in place without complications.  4. Hematuria: Improved at this time and nearly resolved.  5. Anemia: Hemoglobin improves with iron supplement. I recommended oral iron to be taken once a day and to avoid PPIs as much as possible. We also discussed the role for IV iron if needed to.  6. Autoimmune surveillance: He did develop hypothyroidism and currently on low-dose Synthroid. His TSH is improving and he is currently on thyroid supplement. Referral to endocrinology is pending and we will resend his referral at this time.  7. Follow-up: Will be in 3 weeks for the next cycle of therapy.  [facility], MD 6/27/201810:39 AM

## 2016-12-23 LAB — T4, FREE: T4,Free(Direct): 0.32 ng/dL — ABNORMAL LOW (ref 0.82–1.77)

## 2016-12-23 LAB — T3 UPTAKE
FREE THYROXINE INDEX: 0.5 — AB (ref 1.2–4.9)
T3 Uptake Ratio: 23 % — ABNORMAL LOW (ref 24–39)

## 2016-12-23 LAB — T4: T4 TOTAL: 2.2 ug/dL — AB (ref 4.5–12.0)

## 2017-01-18 ENCOUNTER — Ambulatory Visit (HOSPITAL_COMMUNITY)
Admission: RE | Admit: 2017-01-18 | Discharge: 2017-01-18 | Disposition: A | Discharge status: Home / Self Care | Payer: Medicare Other | Source: Ambulatory Visit | Attending: Oncology | Admitting: Oncology

## 2017-01-18 DIAGNOSIS — R911 Solitary pulmonary nodule: Secondary | ICD-10-CM | POA: Insufficient documentation

## 2017-01-18 DIAGNOSIS — C772 Secondary and unspecified malignant neoplasm of intra-abdominal lymph nodes: Secondary | ICD-10-CM | POA: Diagnosis not present

## 2017-01-18 DIAGNOSIS — I2510 Atherosclerotic heart disease of native coronary artery without angina pectoris: Secondary | ICD-10-CM | POA: Diagnosis not present

## 2017-01-18 DIAGNOSIS — C659 Malignant neoplasm of unspecified renal pelvis: Secondary | ICD-10-CM | POA: Diagnosis not present

## 2017-01-18 DIAGNOSIS — R918 Other nonspecific abnormal finding of lung field: Secondary | ICD-10-CM | POA: Diagnosis not present

## 2017-01-18 DIAGNOSIS — J439 Emphysema, unspecified: Secondary | ICD-10-CM | POA: Diagnosis not present

## 2017-01-18 DIAGNOSIS — K449 Diaphragmatic hernia without obstruction or gangrene: Secondary | ICD-10-CM | POA: Insufficient documentation

## 2017-01-18 DIAGNOSIS — I517 Cardiomegaly: Secondary | ICD-10-CM | POA: Diagnosis not present

## 2017-01-18 DIAGNOSIS — M5137 Other intervertebral disc degeneration, lumbosacral region: Secondary | ICD-10-CM | POA: Insufficient documentation

## 2017-01-18 DIAGNOSIS — C689 Malignant neoplasm of urinary organ, unspecified: Secondary | ICD-10-CM

## 2017-01-18 DIAGNOSIS — N2889 Other specified disorders of kidney and ureter: Secondary | ICD-10-CM | POA: Diagnosis not present

## 2017-01-18 DIAGNOSIS — I700 Atherosclerosis of aorta: Secondary | ICD-10-CM | POA: Diagnosis not present

## 2017-01-18 MED ORDER — IOPAMIDOL (ISOVUE-300) INJECTION 61%
75.0000 mL | Freq: Once | INTRAVENOUS | Status: AC | PRN
  Administered 2017-01-18: 75 mL via INTRAVENOUS

## 2017-01-18 MED ORDER — IOPAMIDOL (ISOVUE-300) INJECTION 61%
INTRAVENOUS | Status: AC
  Filled 2017-01-18: qty 75

## 2017-01-20 ENCOUNTER — Ambulatory Visit (HOSPITAL_BASED_OUTPATIENT_CLINIC_OR_DEPARTMENT_OTHER): Payer: Medicare Other

## 2017-01-20 ENCOUNTER — Ambulatory Visit: Payer: Medicare Other

## 2017-01-20 ENCOUNTER — Other Ambulatory Visit: Payer: Self-pay | Admitting: *Deleted

## 2017-01-20 ENCOUNTER — Other Ambulatory Visit (HOSPITAL_BASED_OUTPATIENT_CLINIC_OR_DEPARTMENT_OTHER): Payer: Medicare Other

## 2017-01-20 ENCOUNTER — Ambulatory Visit (HOSPITAL_BASED_OUTPATIENT_CLINIC_OR_DEPARTMENT_OTHER): Payer: Medicare Other | Admitting: Oncology

## 2017-01-20 VITALS — BP 103/62 | HR 69 | Temp 98.2°F | Resp 17 | Ht 70.0 in | Wt 190.0 lb

## 2017-01-20 DIAGNOSIS — E039 Hypothyroidism, unspecified: Secondary | ICD-10-CM

## 2017-01-20 DIAGNOSIS — D649 Anemia, unspecified: Secondary | ICD-10-CM

## 2017-01-20 DIAGNOSIS — C7800 Secondary malignant neoplasm of unspecified lung: Secondary | ICD-10-CM

## 2017-01-20 DIAGNOSIS — C689 Malignant neoplasm of urinary organ, unspecified: Secondary | ICD-10-CM

## 2017-01-20 DIAGNOSIS — C642 Malignant neoplasm of left kidney, except renal pelvis: Secondary | ICD-10-CM | POA: Diagnosis not present

## 2017-01-20 DIAGNOSIS — R319 Hematuria, unspecified: Secondary | ICD-10-CM | POA: Diagnosis not present

## 2017-01-20 DIAGNOSIS — Z95828 Presence of other vascular implants and grafts: Secondary | ICD-10-CM

## 2017-01-20 DIAGNOSIS — Z5112 Encounter for antineoplastic immunotherapy: Secondary | ICD-10-CM | POA: Diagnosis not present

## 2017-01-20 DIAGNOSIS — R05 Cough: Secondary | ICD-10-CM | POA: Diagnosis not present

## 2017-01-20 LAB — COMPREHENSIVE METABOLIC PANEL
ALT: 14 U/L (ref 0–55)
AST: 21 U/L (ref 5–34)
Albumin: 3.2 g/dL — ABNORMAL LOW (ref 3.5–5.0)
Alkaline Phosphatase: 66 U/L (ref 40–150)
Anion Gap: 7 mEq/L (ref 3–11)
BUN: 17.6 mg/dL (ref 7.0–26.0)
CALCIUM: 9.4 mg/dL (ref 8.4–10.4)
CHLORIDE: 101 meq/L (ref 98–109)
CO2: 26 meq/L (ref 22–29)
Creatinine: 1.5 mg/dL — ABNORMAL HIGH (ref 0.7–1.3)
EGFR: 47 mL/min/{1.73_m2} — ABNORMAL LOW (ref >90)
Glucose: 190 mg/dl — ABNORMAL HIGH (ref 70–140)
POTASSIUM: 4 meq/L (ref 3.5–5.1)
Sodium: 135 mEq/L — ABNORMAL LOW (ref 136–145)
Total Bilirubin: 0.49 mg/dL (ref 0.20–1.20)
Total Protein: 6.6 g/dL (ref 6.4–8.3)

## 2017-01-20 LAB — CBC WITH DIFFERENTIAL/PLATELET
BASO%: 0.7 % (ref 0.0–2.0)
BASOS ABS: 0.1 10*3/uL (ref 0.0–0.1)
EOS%: 2.4 % (ref 0.0–7.0)
Eosinophils Absolute: 0.2 10*3/uL (ref 0.0–0.5)
HEMATOCRIT: 31.1 % — AB (ref 38.4–49.9)
HGB: 10.2 g/dL — ABNORMAL LOW (ref 13.0–17.1)
LYMPH#: 0.8 10*3/uL — AB (ref 0.9–3.3)
LYMPH%: 9.1 % — AB (ref 14.0–49.0)
MCH: 29.6 pg (ref 27.2–33.4)
MCHC: 32.7 g/dL (ref 32.0–36.0)
MCV: 90.4 fL (ref 79.3–98.0)
MONO#: 0.8 10*3/uL (ref 0.1–0.9)
MONO%: 8.8 % (ref 0.0–14.0)
NEUT#: 6.8 10*3/uL — ABNORMAL HIGH (ref 1.5–6.5)
NEUT%: 79 % — AB (ref 39.0–75.0)
Platelets: 215 10*3/uL (ref 140–400)
RBC: 3.44 10*6/uL — AB (ref 4.20–5.82)
RDW: 20 % — ABNORMAL HIGH (ref 11.0–14.6)
WBC: 8.6 10*3/uL (ref 4.0–10.3)

## 2017-01-20 MED ORDER — LEVOTHYROXINE SODIUM 50 MCG PO TABS
50.0000 ug | ORAL_TABLET | Freq: Every day | ORAL | 3 refills | Status: DC
Start: 2017-01-20 — End: 2017-01-20

## 2017-01-20 MED ORDER — LEVOTHYROXINE SODIUM 50 MCG PO TABS: 50.0000 ug | ORAL_TABLET | Freq: Every day | ORAL | 3 refills | Status: DC

## 2017-01-20 MED ORDER — SODIUM CHLORIDE 0.9 % IJ SOLN
10.0000 mL | INTRAMUSCULAR | Status: DC | PRN
  Administered 2017-01-20: 10 mL via INTRAVENOUS
  Filled 2017-01-20: qty 10

## 2017-01-20 MED ORDER — SODIUM CHLORIDE 0.9 % IV SOLN
Freq: Once | INTRAVENOUS | Status: AC
  Administered 2017-01-20: 10:00:00 via INTRAVENOUS

## 2017-01-20 MED ORDER — LEVOTHYROXINE SODIUM 50 MCG PO TABS: 50.0000 ug | ORAL_TABLET | Freq: Every day | ORAL | 2 refills | Status: DC

## 2017-01-20 MED ORDER — HEPARIN SOD (PORK) LOCK FLUSH 100 UNIT/ML IV SOLN
500.0000 [IU] | Freq: Once | INTRAVENOUS | Status: DC | PRN
  Filled 2017-01-20: qty 5

## 2017-01-20 MED ORDER — SODIUM CHLORIDE 0.9% FLUSH
10.0000 mL | INTRAVENOUS | Status: DC | PRN
  Filled 2017-01-20: qty 10

## 2017-01-20 MED ORDER — PEMBROLIZUMAB CHEMO INJECTION 100 MG/4ML
200.0000 mg | Freq: Once | INTRAVENOUS | Status: AC
  Administered 2017-01-20: 200 mg via INTRAVENOUS
  Filled 2017-01-20: qty 8

## 2017-01-20 NOTE — Patient Instructions (Signed)
Pembrolizumab injection  What is this medicine?  PEMBROLIZUMAB (pem broe liz ue mab) is a monoclonal antibody. It is used to treat melanoma, head and neck cancer, Hodgkin lymphoma, non-small cell lung cancer, urothelial cancer, stomach cancer, and cancers that have a certain genetic condition.  This medicine may be used for other purposes; ask your health care provider or pharmacist if you have questions.  COMMON BRAND NAME(S): Keytruda  What should I tell my health care provider before I take this medicine?  They need to know if you have any of these conditions:  -diabetes  -immune system problems  -inflammatory bowel disease  -liver disease  -lung or breathing disease  -lupus  -organ transplant  -an unusual or allergic reaction to pembrolizumab, other medicines, foods, dyes, or preservatives  -pregnant or trying to get pregnant  -breast-feeding  How should I use this medicine?  This medicine is for infusion into a vein. It is given by a health care professional in a hospital or clinic setting.  A special MedGuide will be given to you before each treatment. Be sure to read this information carefully each time.  Talk to your pediatrician regarding the use of this medicine in children. While this drug may be prescribed for selected conditions, precautions do apply.  Overdosage: If you think you have taken too much of this medicine contact a poison control center or emergency room at once.  NOTE: This medicine is only for you. Do not share this medicine with others.  What if I miss a dose?  It is important not to miss your dose. Call your doctor or health care professional if you are unable to keep an appointment.  What may interact with this medicine?  Interactions have not been studied.  Give your health care provider a list of all the medicines, herbs, non-prescription drugs, or dietary supplements you use. Also tell them if you smoke, drink alcohol, or use illegal drugs. Some items may interact with your  medicine.  This list may not describe all possible interactions. Give your health care provider a list of all the medicines, herbs, non-prescription drugs, or dietary supplements you use. Also tell them if you smoke, drink alcohol, or use illegal drugs. Some items may interact with your medicine.  What should I watch for while using this medicine?  Your condition will be monitored carefully while you are receiving this medicine.  You may need blood work done while you are taking this medicine.  Do not become pregnant while taking this medicine or for 4 months after stopping it. Women should inform their doctor if they wish to become pregnant or think they might be pregnant. There is a potential for serious side effects to an unborn child. Talk to your health care professional or pharmacist for more information. Do not breast-feed an infant while taking this medicine or for 4 months after the last dose.  What side effects may I notice from receiving this medicine?  Side effects that you should report to your doctor or health care professional as soon as possible:  -allergic reactions like skin rash, itching or hives, swelling of the face, lips, or tongue  -bloody or black, tarry  -breathing problems  -changes in vision  -chest pain  -chills  -constipation  -cough  -dizziness or feeling faint or lightheaded  -fast or irregular heartbeat  -fever  -flushing  -hair loss  -low blood counts - this medicine may decrease the number of white blood cells, red blood cells   and platelets. You may be at increased risk for infections and bleeding.  -muscle pain  -muscle weakness  -persistent headache  -signs and symptoms of high blood sugar such as dizziness; dry mouth; dry skin; fruity breath; nausea; stomach pain; increased hunger or thirst; increased urination  -signs and symptoms of kidney injury like trouble passing urine or change in the amount of urine  -signs and symptoms of liver injury like dark urine, light-colored  stools, loss of appetite, nausea, right upper belly pain, yellowing of the eyes or skin  -stomach pain  -sweating  -weight loss  Side effects that usually do not require medical attention (report to your doctor or health care professional if they continue or are bothersome):  -decreased appetite  -diarrhea  -tiredness  This list may not describe all possible side effects. Call your doctor for medical advice about side effects. You may report side effects to FDA at 1-800-FDA-1088.  Where should I keep my medicine?  This drug is given in a hospital or clinic and will not be stored at home.  NOTE: This sheet is a summary. It may not cover all possible information. If you have questions about this medicine, talk to your doctor, pharmacist, or health care provider.   2018 Elsevier/Gold Standard (2016-03-23 12:29:36)

## 2017-01-20 NOTE — Progress Notes (Signed)
Hematology and Oncology Follow Up Visit  [name] [name] [id number] [birth year] 71 y.o. 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 He presented with 4.9 cm mass of the upper pole of the left kidney with documented pulmonary metastasis. Neurological confirmation done on 04/15/2015 with a biopsy showed urothelial carcinoma.   Prior Therapy:   Status post biopsy of the left renal mass done on 04/15/2015. He is status post Port-A-Cath insertion on 05/21/2015. Systemic chemotherapy in the form of cisplatin and gemcitabine started on 05/27/2015. He is status post 6 cycles completed on 09/16/2015. CT scan on 01/06/2016 showed progression of disease.  Current therapy: Pembrolizumab 200 mg every 3 weeks cycle 1 on 02/13/2016. He is here for the next cycle of therapy.  Interim History:  [name] [name] presents today for a follow-up visit. Since the last visit, he reports no changes in his health. His energy and performance status remains about the same. He tolerates oral iron therapy without recent complications. He does not report any constipation or diarrhea. He does not report any abdominal pain. His performance status and activity level remains about the same.    He tolerated the last cycle of Pembrolizumab without any recent side effects. He continues to have issues with pruritus without any rashes. He denied any respiratory symptoms including dyspnea exertion. He does report some mild cough that is not productive. He denied any pelvic pain or discomfort. He does not report any hematuria or dysuria.   He does not report any headaches, blurry vision, syncope or seizures. He does not report any fevers, chills, or sweats. He does not report any chest pain, palpitation, orthopnea or leg edema. He does not report any wheezing or hemoptysis. He does not report any   vomiting, abdominal pain does report occasional dyspepsia. He does not report any frequency, urgency or hesitancy. He does not report any skeletal complaints. Remaining review of systems unremarkable.   Medications: I have reviewed the patient's current medications.  Current Outpatient Prescriptions  Medication Sig Dispense Refill  . acetaminophen (TYLENOL) 325 MG tablet Take 650 mg by mouth every 6 (six) hours as needed for moderate pain or headache.     [name] atorvastatin (LIPITOR) 40 MG tablet Take 1 tablet (40 mg total) by mouth daily. (Patient taking differently: Take 40 mg by mouth every morning. ) 90 tablet 3  . benzonatate (TESSALON) 200 MG capsule Take 1 capsule (200 mg total) by mouth 3 (three) times daily as needed for cough. 20 capsule 0  . Cholecalciferol (VITAMIN D) 1000 UNITS capsule Take 1,000 Units by mouth daily.      . diphenhydrAMINE (BENADRYL) 25 MG tablet Take 25 mg by mouth every 6 (six) hours as needed.    . docusate sodium (COLACE) 100 MG capsule Take 100 mg by mouth 2 (two) times daily as needed (constipation).    . famotidine (PEPCID) 20 MG tablet Take 1 tablet (20 mg total) by mouth daily. 30 tablet 2  . ferrous sulfate 325 (65 FE) MG EC tablet Take 1 tablet (325 mg total) by mouth 2 (two) times daily. 60 tablet 3  . levothyroxine (SYNTHROID) 25 MCG tablet Take 1 tablet (25 mcg total) by mouth daily before breakfast. 30 tablet 1  . levothyroxine (SYNTHROID) 50 MCG tablet Take 1 tablet (50 mcg total) by mouth daily before breakfast. 60 tablet 3  . lidocaine-prilocaine (EMLA) cream Apply  to port-a-cath 1-2 hours prior to acces. Cover with saran wrap. 30 g 1  . lisinopril (PRINIVIL,ZESTRIL) 5 MG tablet     . metoprolol tartrate (LOPRESSOR) 25 MG tablet Take 0.5 tablets (12.5 mg total) by mouth 2 (two) times daily. 90 tablet 3  . nitroGLYCERIN (NITROSTAT) 0.4 MG SL tablet Place 1 tablet (0.4 mg total) under the tongue every 5 (five) minutes as needed. May repeat up to 3 doses.  100 tablet 3  . polycarbophil (FIBERCON) 625 MG tablet Take 625 mg by mouth 2 (two) times daily.     [name] PRESCRIPTION MEDICATION Inject 160 mg into the muscle daily. Gentamycin given for 3 days at [facility]    . sildenafil (VIAGRA) 50 MG tablet Take 50 mg by mouth as needed for erectile dysfunction.      No current facility-administered medications for this visit.      Allergies:  Allergies  Allergen Reactions  . Hydrocodone Other (See Comments)    Became too sedated    Past Medical History, Surgical history, Social history, and Family History were reviewed and updated.   Physical Exam: Blood pressure 103/62, pulse 69, temperature 98.2 F (36.8 C), temperature source Oral, resp. rate 17, height 5\' 10"  (1.778 m), weight 190 lb (86.2 kg), SpO2 100 %. ECOG: 0 General appearance: Alert, awake gentleman without distress. Head: Normocephalic, without obvious abnormality no oral ulcers or lesions. Neck: no adenopathy no thyroid masses. Lymph nodes: Cervical, supraclavicular, and axillary nodes normal. Heart:regular rate and rhythm, S1, S2 normal, no murmur, click, rub or gallop Lung:chest clear, no wheezing, rales, normal symmetric air entry Abdomin: soft, non-tender, without masses or organomegaly no shifting dullness or ascites. EXT:no erythema, induration, or nodules Skin: No rashes or lesions.  Lab Results: Lab Results  Component Value Date   WBC 8.6 01/20/2017   HGB 10.2 (L) 01/20/2017   HCT 31.1 (L) 01/20/2017   MCV 90.4 01/20/2017   PLT 215 01/20/2017     Chemistry      Component Value Date/Time   NA 137 12/22/2016 0953   K 4.1 12/22/2016 0953   CL 103 07/20/2016 0843   CO2 27 12/22/2016 0953   BUN 17.1 12/22/2016 0953   CREATININE 1.5 (H) 12/22/2016 0953      Component Value Date/Time   CALCIUM 9.7 12/22/2016 0953   ALKPHOS 69 12/22/2016 0953   AST 22 12/22/2016 0953   ALT 14 12/22/2016 0953   BILITOT 0.48 12/22/2016 0953     EXAM: CT CHEST,  ABDOMEN, AND PELVIS WITH CONTRAST  TECHNIQUE: Multidetector CT imaging of the chest, abdomen and pelvis was performed following the standard protocol during bolus administration of intravenous contrast.  CONTRAST:  23mL ISOVUE-300 IOPAMIDOL (ISOVUE-300) INJECTION 61%  COMPARISON:  10/19/2016  FINDINGS: CT CHEST FINDINGS  Cardiovascular: A right-sided Port-A-Cath terminates at the high right atrium. Aortic and branch vessel atherosclerosis. Mild cardiomegaly, without pericardial effusion. Median sternotomy for CABG.  Mediastinum/Nodes: No supraclavicular adenopathy. No mediastinal or hilar adenopathy. Tiny hiatal hernia.  Lungs/Pleura: No pleural fluid. Moderate centrilobular and mild paraseptal emphysema. Minimal motion degradation.  Spiculated right lower lobe pulmonary nodule measures 1.8 x 1.6 cm on image 85/series 4 versus 1.2 x 1.2 cm on the prior exam. Pleural-based left apical nodule is similar at 9 mm on image 19/series 4.  4 mm left upper lobe pulmonary nodule on image 49/ series 4, similar.  Musculoskeletal: No acute osseous abnormality.  CT ABDOMEN PELVIS FINDINGS  Hepatobiliary: High left hepatic lobe cyst. No  suspicious liver lesion. Normal gallbladder, without biliary ductal dilatation.  Pancreas: Normal, without mass or ductal dilatation.  Spleen: Normal in size, without focal abnormality.  Adrenals/Urinary Tract: Normal adrenal glands. Significant enlargement in infiltrative inter and upper pole left renal lesion. This measures 6.4 x 5.7 cm on image 59/series 2 versus 2.3 x 2.6 cm on the prior exam. Normal right kidney, without hydronephrosis. Normal urinary bladder.  Stomach/Bowel: Normal remainder of the stomach. Normal colon, appendix, and terminal ileum. Normal small bowel.  Vascular/Lymphatic: Advanced aortic and branch vessel atherosclerosis. Index retrocaval node measures 1.4 cm on image 65/series 2 versus 1.0 cm on the  prior. No pelvic sidewall adenopathy.  Reproductive: Mild prostatomegaly.  Other: No significant free fluid.  Musculoskeletal: No acute osseous abnormality. Degenerative disc disease involves the lumbosacral junction.  IMPRESSION: 1. Significant enlargement of the infiltrative inter/upper pole left renal mass. 2. Progression of retroperitoneal nodal metastasis. 3. Enlargement of dominant right lower lobe pulmonary nodule/metastasis. 4.  Aortic Atherosclerosis (ICD10-I70.0). 5.  Emphysema (ICD10-J43.9). 6.  Tiny hiatal hernia.    71 year old gentleman with the following issues:  1. 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 He did have a pathological confirmation done on 04/15/2015 with the specimen showed urothelial carcinoma.  He is status post 6 cycles of therapy completed in March 2017. CT scan on 04/11/2017showed continuous response to therapy. He had further decrease in his bilateral pulmonary nodules as well as decrease in the left upper pole renal lesion.   CT scan on 01/06/2016 showed mild progression of disease especially in his primary tumor. His pulmonary nodules have remained stable.   He is currently on Pembrolizumab and tolerates it very well.  CT scan on 01/18/2017 was also reviewed and discussed with the patient today. He continues to have excellent response overall with very minimal residual disease compared to his original CT scans. His primary tumor continues to grow however.  The options of therapy were reviewed today which include continuing Pembrolizumab, treatment holiday versus switching to systemic chemotherapy. The rationale for all these options were reviewed today as well as risks and benefits. After discussion today, we have elected to continue with Pembrolizumab given his excellent  systemic control. He has no local symptoms and local therapy could be considered whether that would be as primary surgery or radiation if needed to in the future.  The plan is to repeat imaging studies in 2-3 months to follow his progress.  2. Nausea and GI complication prophylaxis: He has not been an issue at this time.  3. IV access: Port-A-Cath remains in place without complications.  4. Hematuria: Improved at this time and nearly resolved. Hemoglobin remains stable.  5. Anemia: Hemoglobin improves with iron supplement. I recommended oral iron to be taken once a day and to avoid PPIs as much as possible. We also discussed the role for IV iron if needed to. I see no need for IV iron at this time.  6. Autoimmune surveillance: He did develop hypothyroidism and currently on low-dose Synthroid. Thyroid panel continues to show hypothyroidism. Referral to endocrinology is pending in the meantime 5 up to his Synthroid to 50 g daily.  7. Pulmonary toxicity surveillance: His CT scan did not show any interstitial lung disease he has minimal symptoms. We will continue to monitor this closely.  8. Follow-up: Will be in 3  weeks for the next cycle of therapy.  [facility], MD 7/26/20189:15 AM

## 2017-01-20 NOTE — Patient Instructions (Signed)

## 2017-01-20 NOTE — Addendum Note (Signed)
Addended by: Wyatt Portela on: 01/20/2017 09:28 AM   Modules accepted: Orders

## 2017-02-07 ENCOUNTER — Other Ambulatory Visit: Payer: Self-pay | Admitting: Oncology

## 2017-02-10 ENCOUNTER — Ambulatory Visit (HOSPITAL_BASED_OUTPATIENT_CLINIC_OR_DEPARTMENT_OTHER): Payer: Medicare Other | Admitting: Oncology

## 2017-02-10 ENCOUNTER — Ambulatory Visit (HOSPITAL_BASED_OUTPATIENT_CLINIC_OR_DEPARTMENT_OTHER): Payer: Medicare Other

## 2017-02-10 ENCOUNTER — Ambulatory Visit: Payer: Medicare Other

## 2017-02-10 ENCOUNTER — Telehealth: Payer: Self-pay | Admitting: Oncology

## 2017-02-10 ENCOUNTER — Other Ambulatory Visit (HOSPITAL_BASED_OUTPATIENT_CLINIC_OR_DEPARTMENT_OTHER): Payer: Medicare Other

## 2017-02-10 VITALS — BP 121/51 | HR 75 | Temp 98.9°F | Resp 18 | Ht 70.0 in | Wt 184.3 lb

## 2017-02-10 DIAGNOSIS — C642 Malignant neoplasm of left kidney, except renal pelvis: Secondary | ICD-10-CM

## 2017-02-10 DIAGNOSIS — C689 Malignant neoplasm of urinary organ, unspecified: Secondary | ICD-10-CM

## 2017-02-10 DIAGNOSIS — Z5112 Encounter for antineoplastic immunotherapy: Secondary | ICD-10-CM

## 2017-02-10 DIAGNOSIS — R05 Cough: Secondary | ICD-10-CM

## 2017-02-10 DIAGNOSIS — D649 Anemia, unspecified: Secondary | ICD-10-CM

## 2017-02-10 DIAGNOSIS — C7800 Secondary malignant neoplasm of unspecified lung: Secondary | ICD-10-CM

## 2017-02-10 DIAGNOSIS — D62 Acute posthemorrhagic anemia: Secondary | ICD-10-CM

## 2017-02-10 LAB — CBC WITH DIFFERENTIAL/PLATELET
BASO%: 1 % (ref 0.0–2.0)
BASOS ABS: 0.1 10*3/uL (ref 0.0–0.1)
EOS ABS: 0.2 10*3/uL (ref 0.0–0.5)
EOS%: 2 % (ref 0.0–7.0)
HCT: 31.3 % — ABNORMAL LOW (ref 38.4–49.9)
HGB: 10.3 g/dL — ABNORMAL LOW (ref 13.0–17.1)
LYMPH%: 10.8 % — AB (ref 14.0–49.0)
MCH: 29.7 pg (ref 27.2–33.4)
MCHC: 32.8 g/dL (ref 32.0–36.0)
MCV: 90.8 fL (ref 79.3–98.0)
MONO#: 1 10*3/uL — ABNORMAL HIGH (ref 0.1–0.9)
MONO%: 11.3 % (ref 0.0–14.0)
NEUT#: 6.5 10*3/uL (ref 1.5–6.5)
NEUT%: 74.9 % (ref 39.0–75.0)
Platelets: 202 10*3/uL (ref 140–400)
RBC: 3.45 10*6/uL — AB (ref 4.20–5.82)
RDW: 17.4 % — ABNORMAL HIGH (ref 11.0–14.6)
WBC: 8.7 10*3/uL (ref 4.0–10.3)
lymph#: 0.9 10*3/uL (ref 0.9–3.3)

## 2017-02-10 LAB — COMPREHENSIVE METABOLIC PANEL
ALBUMIN: 3.1 g/dL — AB (ref 3.5–5.0)
ALK PHOS: 68 U/L (ref 40–150)
ALT: 10 U/L (ref 0–55)
ANION GAP: 9 meq/L (ref 3–11)
AST: 20 U/L (ref 5–34)
BUN: 21.1 mg/dL (ref 7.0–26.0)
CALCIUM: 10 mg/dL (ref 8.4–10.4)
CO2: 24 mEq/L (ref 22–29)
Chloride: 101 mEq/L (ref 98–109)
Creatinine: 1.4 mg/dL — ABNORMAL HIGH (ref 0.7–1.3)
EGFR: 48 mL/min/{1.73_m2} — ABNORMAL LOW (ref >90)
Glucose: 154 mg/dl — ABNORMAL HIGH (ref 70–140)
POTASSIUM: 3.8 meq/L (ref 3.5–5.1)
SODIUM: 134 meq/L — AB (ref 136–145)
Total Bilirubin: 0.66 mg/dL (ref 0.20–1.20)
Total Protein: 6.8 g/dL (ref 6.4–8.3)

## 2017-02-10 MED ORDER — HEPARIN SOD (PORK) LOCK FLUSH 100 UNIT/ML IV SOLN
500.0000 [IU] | Freq: Once | INTRAVENOUS | Status: AC | PRN
  Administered 2017-02-10: 500 [IU]
  Filled 2017-02-10: qty 5

## 2017-02-10 MED ORDER — SODIUM CHLORIDE 0.9% FLUSH
10.0000 mL | INTRAVENOUS | Status: DC | PRN
Start: 2017-02-10 — End: 2017-02-10
  Administered 2017-02-10: 10 mL
  Filled 2017-02-10: qty 10

## 2017-02-10 MED ORDER — SODIUM CHLORIDE 0.9 % IV SOLN
Freq: Once | INTRAVENOUS | Status: AC
  Administered 2017-02-10: 13:00:00 via INTRAVENOUS

## 2017-02-10 MED ORDER — SODIUM CHLORIDE 0.9 % IV SOLN
200.0000 mg | Freq: Once | INTRAVENOUS | Status: AC
  Administered 2017-02-10: 200 mg via INTRAVENOUS
  Filled 2017-02-10: qty 8

## 2017-02-10 NOTE — Telephone Encounter (Signed)
Gave patient calendar and they did not want avs.

## 2017-02-10 NOTE — Patient Instructions (Signed)
[facility] Discharge Instructions for Patients Receiving Chemotherapy  Today you received the following chemotherapy agents: Keytruda   To help prevent nausea and vomiting after your treatment, we encourage you to take your nausea medication as directed    If you develop nausea and vomiting that is not controlled by your nausea medication, call the clinic.   BELOW ARE SYMPTOMS THAT SHOULD BE REPORTED IMMEDIATELY:  *FEVER GREATER THAN 100.5 F  *CHILLS WITH OR WITHOUT FEVER  NAUSEA AND VOMITING THAT IS NOT CONTROLLED WITH YOUR NAUSEA MEDICATION  *UNUSUAL SHORTNESS OF BREATH  *UNUSUAL BRUISING OR BLEEDING  TENDERNESS IN MOUTH AND THROAT WITH OR WITHOUT PRESENCE OF ULCERS  *URINARY PROBLEMS  *BOWEL PROBLEMS  UNUSUAL RASH Items with * indicate a potential emergency and should be followed up as soon as possible.  Feel free to call the clinic you have any questions or concerns. The clinic phone number is [phone].  Please show the CHEMO ALERT CARD at check-in to the Emergency Department and triage nurse.   

## 2017-02-10 NOTE — Progress Notes (Signed)
Hematology and Oncology Follow Up Visit  [name] [name] [id number] August 22, 1945 71 y.o. 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 He presented with 4.9 cm mass of the upper pole of the left kidney with documented pulmonary metastasis. Neurological confirmation done on 04/15/2015 with a biopsy showed urothelial carcinoma.   Prior Therapy:   Status post biopsy of the left renal mass done on 04/15/2015. He is status post Port-A-Cath insertion on 05/21/2015. Systemic chemotherapy in the form of cisplatin and gemcitabine started on 05/27/2015. He is status post 6 cycles completed on 09/16/2015. CT scan on 01/06/2016 showed progression of disease.  Current therapy: Pembrolizumab 200 mg every 3 weeks cycle 1 on 02/13/2016. He is here for the next cycle of therapy.  Interim History:  [name] [name] presents today for a follow-up visit. Since the last visit, he reports doing well without any new complaints. He does report mild nonproductive cough without any shortness of breath or hemoptysis.He tolerated the last cycle of Pembrolizumab without any recent side effects. He denied any pelvic pain or discomfort. He does not report any hematuria or dysuria.    He tolerates oral iron therapy without recent complications. He does not report any constipation or diarrhea. He does not report any abdominal pain. His performance status and activity level remains about the same.    He tolerated the last cycle of Pembrolizumab without any recent side effects. He continues to have issues with pruritus without any rashes. He denied any respiratory symptoms including dyspnea exertion. He does report some mild cough that is not productive. He denied any pelvic pain or discomfort. He does not report any hematuria or dysuria.   He does not report any headaches, blurry vision,  syncope or seizures. He does not report any fevers, chills, or sweats. He does not report any chest pain, palpitation, orthopnea or leg edema. He does not report any wheezing or hemoptysis. He does not report any  vomiting, abdominal pain does report occasional dyspepsia. He does not report any frequency, urgency or hesitancy. He does not report any skeletal complaints. Remaining review of systems unremarkable.   Medications: I have reviewed the patient's current medications.  Current Outpatient Prescriptions  Medication Sig Dispense Refill  . acetaminophen (TYLENOL) 325 MG tablet Take 650 mg by mouth every 6 (six) hours as needed for moderate pain or headache.     [name] atorvastatin (LIPITOR) 40 MG tablet Take 1 tablet (40 mg total) by mouth daily. (Patient taking differently: Take 40 mg by mouth every morning. ) 90 tablet 3  . benzonatate (TESSALON) 200 MG capsule TAKE ONE CAPSULE BY MOUTH 3 TIMES A DAY AS NEEDED FOR COUGH 20 capsule 0  . Cholecalciferol (VITAMIN D) 1000 UNITS capsule Take 1,000 Units by mouth daily.      . diphenhydrAMINE (BENADRYL) 25 MG tablet Take 25 mg by mouth every 6 (six) hours as needed.    . docusate sodium (COLACE) 100 MG capsule Take 100 mg by mouth 2 (two) times daily as needed (constipation).    . famotidine (PEPCID) 20 MG tablet Take 1 tablet (20 mg total) by mouth daily. 30 tablet 2  . ferrous sulfate 325 (65 FE) MG EC tablet Take 1 tablet (325 mg total) by mouth 2 (two) times daily. 60 tablet 3  . levothyroxine (SYNTHROID) 50 MCG tablet Take 1 tablet (50 mcg total) by mouth daily before  breakfast. 90 tablet 2  . lidocaine-prilocaine (EMLA) cream Apply to port-a-cath 1-2 hours prior to acces. Cover with saran wrap. 30 g 1  . lisinopril (PRINIVIL,ZESTRIL) 5 MG tablet     . metoprolol tartrate (LOPRESSOR) 25 MG tablet Take 0.5 tablets (12.5 mg total) by mouth 2 (two) times daily. 90 tablet 3  . nitroGLYCERIN (NITROSTAT) 0.4 MG SL tablet Place 1 tablet (0.4 mg total)  under the tongue every 5 (five) minutes as needed. May repeat up to 3 doses. 100 tablet 3  . polycarbophil (FIBERCON) 625 MG tablet Take 625 mg by mouth 2 (two) times daily.     [name] PRESCRIPTION MEDICATION Inject 160 mg into the muscle daily. Gentamycin given for 3 days at [facility]    . sildenafil (VIAGRA) 50 MG tablet Take 50 mg by mouth as needed for erectile dysfunction.      No current facility-administered medications for this visit.      Allergies:  Allergies  Allergen Reactions  . Hydrocodone Other (See Comments)    Became too sedated    Past Medical History, Surgical history, Social history, and Family History were reviewed and updated.   Physical Exam: Blood pressure (!) 121/51, pulse 75, temperature 98.9 F (37.2 C), temperature source Oral, resp. rate 18, height 5\' 10"  (1.778 m), weight 184 lb 4.8 oz (83.6 kg), SpO2 100 %. ECOG: 0 General appearance: Well-appearing gentleman without distress. Head: Normocephalic, without obvious abnormality no oral ulcers or thrush. Neck: no adenopathy no thyroid masses. Lymph nodes: Cervical, supraclavicular, and axillary nodes normal. Heart:regular rate and rhythm, S1, S2 normal, no murmur, click, rub or gallop Lung:chest clear, no wheezing, rales, normal symmetric air entry Abdomin: soft, non-tender, without masses or organomegaly no rebound or guarding. EXT:no erythema, induration, or nodules Skin: No rashes or petechiae.  Lab Results: Lab Results  Component Value Date   WBC 8.7 02/10/2017   HGB 10.3 (L) 02/10/2017   HCT 31.3 (L) 02/10/2017   MCV 90.8 02/10/2017   PLT 202 02/10/2017     Chemistry      Component Value Date/Time   NA 135 (L) 01/20/2017 0832   K 4.0 01/20/2017 0832   CL 103 07/20/2016 0843   CO2 26 01/20/2017 0832   BUN 17.6 01/20/2017 0832   CREATININE 1.5 (H) 01/20/2017 0832      Component Value Date/Time   CALCIUM 9.4 01/20/2017 0832   ALKPHOS 66 01/20/2017 0832   AST 21 01/20/2017 0832    ALT 14 01/20/2017 0832   BILITOT 0.49 01/20/2017 6062        71 year old gentleman with the following issues:  1. 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 He did have a pathological confirmation done on 04/15/2015 with the specimen showed urothelial carcinoma.  He is status post 6 cycles of therapy completed in March 2017. CT scan on 04/11/2017showed continuous response to therapy. He had further decrease in his bilateral pulmonary nodules as well as decrease in the left upper pole renal lesion.   CT scan on 01/06/2016 showed mild progression of disease especially in his primary tumor. His pulmonary nodules have remained stable.   He is currently on Pembrolizumab and tolerates it very well.  CT scan on 01/18/2017 showed response overall with very minimal residual disease compared to his original CT scans. His primary tumor continues to  grow however.  The options of therapy were reviewed today which include continuing Pembrolizumab versus switching to systemic chemotherapy. For the time being he opted to continue with Pembrolizumab. We will continue with the same dose and schedule and repeat imaging studies in 2 months.   2. Nausea and GI complication prophylaxis: He has not been an issue at this time.  3. IV access: Port-A-Cath remains in place without complications.  4. Hematuria: Improved at this time and nearly resolved. Hemoglobin remains stable.  5. Anemia: Hemoglobin improves with iron supplement. He does not require any intravenous iron or transfusion at this time.  6. Autoimmune surveillance: He did develop hypothyroidism and currently on 50 g of Synthroid.  7. Pulmonary toxicity surveillance: His CT scan did not show any interstitial lung disease he has minimal symptoms. We will continue to monitor this  closely.  8. Follow-up: Will be in 3 weeks for the next cycle of therapy.  [facility], MD 8/16/201812:08 PM

## 2017-02-22 ENCOUNTER — Other Ambulatory Visit: Payer: Self-pay | Admitting: Oncology

## 2017-02-24 ENCOUNTER — Other Ambulatory Visit: Payer: Self-pay | Admitting: Oncology

## 2017-02-24 ENCOUNTER — Other Ambulatory Visit: Payer: Self-pay | Admitting: *Deleted

## 2017-02-24 MED ORDER — BENZONATATE 200 MG PO CAPS: 200.0000 mg | ORAL_CAPSULE | Freq: Three times a day (TID) | ORAL | 0 refills | Status: DC | PRN

## 2017-03-03 ENCOUNTER — Ambulatory Visit (HOSPITAL_BASED_OUTPATIENT_CLINIC_OR_DEPARTMENT_OTHER): Payer: Medicare Other | Admitting: Oncology

## 2017-03-03 ENCOUNTER — Ambulatory Visit: Payer: Medicare Other

## 2017-03-03 ENCOUNTER — Telehealth: Payer: Self-pay

## 2017-03-03 ENCOUNTER — Other Ambulatory Visit (HOSPITAL_BASED_OUTPATIENT_CLINIC_OR_DEPARTMENT_OTHER): Payer: Medicare Other

## 2017-03-03 DIAGNOSIS — C642 Malignant neoplasm of left kidney, except renal pelvis: Secondary | ICD-10-CM

## 2017-03-03 DIAGNOSIS — C689 Malignant neoplasm of urinary organ, unspecified: Secondary | ICD-10-CM

## 2017-03-03 DIAGNOSIS — Z95828 Presence of other vascular implants and grafts: Secondary | ICD-10-CM

## 2017-03-03 DIAGNOSIS — D649 Anemia, unspecified: Secondary | ICD-10-CM

## 2017-03-03 DIAGNOSIS — R5383 Other fatigue: Secondary | ICD-10-CM | POA: Diagnosis not present

## 2017-03-03 DIAGNOSIS — C7800 Secondary malignant neoplasm of unspecified lung: Secondary | ICD-10-CM

## 2017-03-03 DIAGNOSIS — R05 Cough: Secondary | ICD-10-CM | POA: Diagnosis not present

## 2017-03-03 DIAGNOSIS — D62 Acute posthemorrhagic anemia: Secondary | ICD-10-CM

## 2017-03-03 LAB — CBC WITH DIFFERENTIAL/PLATELET
BASO%: 0.3 % (ref 0.0–2.0)
Basophils Absolute: 0 10*3/uL (ref 0.0–0.1)
EOS ABS: 0.1 10*3/uL (ref 0.0–0.5)
EOS%: 1.1 % (ref 0.0–7.0)
HCT: 31.2 % — ABNORMAL LOW (ref 38.4–49.9)
HGB: 9.8 g/dL — ABNORMAL LOW (ref 13.0–17.1)
LYMPH#: 0.7 10*3/uL — AB (ref 0.9–3.3)
LYMPH%: 7.2 % — ABNORMAL LOW (ref 14.0–49.0)
MCH: 29.3 pg (ref 27.2–33.4)
MCHC: 31.4 g/dL — AB (ref 32.0–36.0)
MCV: 93.4 fL (ref 79.3–98.0)
MONO#: 1 10*3/uL — AB (ref 0.1–0.9)
MONO%: 9.4 % (ref 0.0–14.0)
NEUT#: 8.3 10*3/uL — ABNORMAL HIGH (ref 1.5–6.5)
NEUT%: 82 % — ABNORMAL HIGH (ref 39.0–75.0)
PLATELETS: 199 10*3/uL (ref 140–400)
RBC: 3.34 10*6/uL — AB (ref 4.20–5.82)
RDW: 14.4 % (ref 11.0–14.6)
WBC: 10.1 10*3/uL (ref 4.0–10.3)

## 2017-03-03 LAB — COMPREHENSIVE METABOLIC PANEL
ALT: 8 U/L (ref 0–55)
ANION GAP: 9 meq/L (ref 3–11)
AST: 16 U/L (ref 5–34)
Albumin: 2.9 g/dL — ABNORMAL LOW (ref 3.5–5.0)
Alkaline Phosphatase: 68 U/L (ref 40–150)
BUN: 18.7 mg/dL (ref 7.0–26.0)
CHLORIDE: 100 meq/L (ref 98–109)
CO2: 25 meq/L (ref 22–29)
Calcium: 10.5 mg/dL — ABNORMAL HIGH (ref 8.4–10.4)
Creatinine: 1.3 mg/dL (ref 0.7–1.3)
EGFR: 54 mL/min/{1.73_m2} — AB (ref >90)
GLUCOSE: 153 mg/dL — AB (ref 70–140)
Potassium: 4.2 mEq/L (ref 3.5–5.1)
SODIUM: 134 meq/L — AB (ref 136–145)
Total Bilirubin: 0.64 mg/dL (ref 0.20–1.20)
Total Protein: 6.6 g/dL (ref 6.4–8.3)

## 2017-03-03 LAB — FERRITIN: Ferritin: 120 ng/ml (ref 22–316)

## 2017-03-03 LAB — IRON AND TIBC
%SAT: 14 % — AB (ref 20–55)
IRON: 32 ug/dL — AB (ref 42–163)
TIBC: 223 ug/dL (ref 202–409)
UIBC: 191 ug/dL (ref 117–376)

## 2017-03-03 MED ORDER — SODIUM CHLORIDE 0.9% FLUSH
10.0000 mL | INTRAVENOUS | Status: DC | PRN
  Administered 2017-03-03: 10 mL via INTRAVENOUS
  Filled 2017-03-03: qty 10

## 2017-03-03 MED ORDER — HEPARIN SOD (PORK) LOCK FLUSH 100 UNIT/ML IV SOLN
500.0000 [IU] | Freq: Once | INTRAVENOUS | Status: AC
  Administered 2017-03-03: 500 [IU] via INTRAVENOUS
  Filled 2017-03-03: qty 5

## 2017-03-03 MED ORDER — SODIUM CHLORIDE 0.9 % IJ SOLN
10.0000 mL | INTRAMUSCULAR | Status: DC | PRN
  Administered 2017-03-03: 10 mL via INTRAVENOUS
  Filled 2017-03-03: qty 10

## 2017-03-03 NOTE — Telephone Encounter (Signed)
Gave patient avs, and calender for September per los

## 2017-03-03 NOTE — Progress Notes (Signed)
Hematology and Oncology Follow Up Visit  [name] [name] [id number] Sep 10, 1945 71 y.o. 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 He presented with 4.9 cm mass of the upper pole of the left kidney with documented pulmonary metastasis. Neurological confirmation done on 04/15/2015 with a biopsy showed urothelial carcinoma.   Prior Therapy:   Status post biopsy of the left renal mass done on 04/15/2015. He is status post Port-A-Cath insertion on 05/21/2015. Systemic chemotherapy in the form of cisplatin and gemcitabine started on 05/27/2015. He is status post 6 cycles completed on 09/16/2015. CT scan on 01/06/2016 showed progression of disease.  Current therapy: Pembrolizumab 200 mg every 3 weeks cycle 1 on 02/13/2016. He is here for the next cycle of therapy.  Interim History:  [name] [name] presents today for a follow-up visit. Since the last visit, he reports few complaints undergone worse since the last visit. He is reporting increased in his nonproductive dry cough and some slight dyspnea on exertion. He is reporting more fatigue and not able to do the same activities he was doing before. He denied any hematuria or dysuria he denied any pruritus. He continues to take iron although he is taking once a day at times. His wife thinks his mood is down and he is depressed. He attributes his node being down because of physical limitation he is experiencing.   He tolerated the last cycle of Pembrolizumab without any new side effects. He is reporting left-sided pelvic discomfort at times but takes only Tylenol for it.  He does not report any headaches, blurry vision, syncope or seizures. He does not report any fevers, chills, or sweats. He does not report any chest pain, palpitation, orthopnea or leg edema. He does not report any wheezing or hemoptysis. He does  not report any  vomiting, abdominal pain does report occasional dyspepsia. He does not report any frequency, urgency or hesitancy. He does not report any skeletal complaints. Remaining review of systems unremarkable.   Medications: I have reviewed the patient's current medications.  Current Outpatient Prescriptions  Medication Sig Dispense Refill  . acetaminophen (TYLENOL) 325 MG tablet Take 650 mg by mouth every 6 (six) hours as needed for moderate pain or headache.     [name] atorvastatin (LIPITOR) 40 MG tablet Take 1 tablet (40 mg total) by mouth daily. (Patient taking differently: Take 40 mg by mouth every morning. ) 90 tablet 3  . benzonatate (TESSALON) 200 MG capsule TAKE ONE CAPSULE BY MOUTH 3 TIMES A DAY AS NEEDED FOR COUGH 30 capsule 0  . benzonatate (TESSALON) 200 MG capsule Take 1 capsule (200 mg total) by mouth 3 (three) times daily as needed for cough. 30 capsule 0  . Cholecalciferol (VITAMIN D) 1000 UNITS capsule Take 1,000 Units by mouth daily.      . diphenhydrAMINE (BENADRYL) 25 MG tablet Take 25 mg by mouth every 6 (six) hours as needed.    . docusate sodium (COLACE) 100 MG capsule Take 100 mg by mouth 2 (two) times daily as needed (constipation).    . famotidine (PEPCID) 20 MG tablet Take 1 tablet (20 mg total) by mouth daily. 30 tablet 2  . ferrous sulfate 325 (65 FE) MG EC tablet Take 1 tablet (325 mg total) by mouth 2 (two) times daily. 60 tablet 3  . levothyroxine (SYNTHROID) 50 MCG tablet Take 1 tablet (50 mcg total) by mouth  daily before breakfast. 90 tablet 2  . lidocaine-prilocaine (EMLA) cream Apply to port-a-cath 1-2 hours prior to acces. Cover with saran wrap. 30 g 1  . lisinopril (PRINIVIL,ZESTRIL) 5 MG tablet     . metoprolol tartrate (LOPRESSOR) 25 MG tablet Take 0.5 tablets (12.5 mg total) by mouth 2 (two) times daily. 90 tablet 3  . nitroGLYCERIN (NITROSTAT) 0.4 MG SL tablet Place 1 tablet (0.4 mg total) under the tongue every 5 (five) minutes as needed. May repeat up  to 3 doses. 100 tablet 3  . polycarbophil (FIBERCON) 625 MG tablet Take 625 mg by mouth 2 (two) times daily.     [name] PRESCRIPTION MEDICATION Inject 160 mg into the muscle daily. Gentamycin given for 3 days at [facility]    . sildenafil (VIAGRA) 50 MG tablet Take 50 mg by mouth as needed for erectile dysfunction.      No current facility-administered medications for this visit.      Allergies:  Allergies  Allergen Reactions  . Hydrocodone Other (See Comments)    Became too sedated    Past Medical History, Surgical history, Social history, and Family History were reviewed and updated.   Physical Exam: Blood pressure (!) 125/57, pulse 70, temperature 98.2 F (36.8 C), temperature source Oral, resp. rate 18, height 5\' 10"  (1.778 m), weight 181 lb (82.1 kg), SpO2 99 %. ECOG: 1 General appearance: Alert, awake gentleman appeared without distress. Head: Normocephalic, without obvious abnormality no oral thrush or ulcers. Neck: no adenopathy no thyroid masses. Lymph nodes: Cervical, supraclavicular, and axillary nodes normal. Heart:regular rate and rhythm, S1, S2 normal, no murmur, click, rub or gallop Lung:chest clear, no wheezing, rales, normal symmetric air entry Abdomin: soft, non-tender, without masses or organomegaly no shifting dullness or ascites. EXT:no erythema, induration, or nodules Skin: No rashes or petechiae.  Lab Results: Lab Results  Component Value Date   WBC 10.1 03/03/2017   HGB 9.8 (L) 03/03/2017   HCT 31.2 (L) 03/03/2017   MCV 93.4 03/03/2017   PLT 199 03/03/2017     Chemistry      Component Value Date/Time   NA 134 (L) 02/10/2017 1121   K 3.8 02/10/2017 1121   CL 103 07/20/2016 0843   CO2 24 02/10/2017 1121   BUN 21.1 02/10/2017 1121   CREATININE 1.4 (H) 02/10/2017 1121      Component Value Date/Time   CALCIUM 10.0 02/10/2017 1121   ALKPHOS 68 02/10/2017 1121   AST 20 02/10/2017 1121   ALT 10 02/10/2017 1121   BILITOT 0.66 02/10/2017 11224         71 year old gentleman with the following issues:  1. 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 He did have a pathological confirmation done on 04/15/2015 with the specimen showed urothelial carcinoma.  He is status post 6 cycles of therapy completed in March 2017. CT scan on 04/11/2017showed continuous response to therapy. He had further decrease in his bilateral pulmonary nodules as well as decrease in the left upper pole renal lesion.   CT scan on 01/06/2016 showed mild progression of disease especially in his primary tumor. His pulmonary nodules have remained stable.   He is currently on Pembrolizumab and tolerates it very well.  CT scan on 01/18/2017 showed response overall with very minimal residual disease compared to his original CT scans. His primary  tumor continues to grow however.  Risks and benefits of continuing Pembrolizumab were reviewed today. He is experiencing more side effects at this time nonproductive cough and excessive fatigue. Given these findings, we'll give him a treatment break and reassess his clinical status and 3 weeks. The plan is to repeat imaging studies in 6-8 weeks.   2. Fatigue: Appears to be multifactorial related to Pembrolizumab, hypothyroidism and anemia. No element of depression could also be contributing. We will continue to monitor this moving forward as we addressed these issues individually.  3. IV access: Port-A-Cath remains in place without complications.  4. Hematuria: Improved at this time and nearly resolved.   5. Anemia: Hemoglobin appears to be drifting again despite not having significant hematuria. His iron studies are currently pending but he would benefit from IV iron if his iron continues to be low. His oral iron has been ineffective in replacing his iron stores.  Risks and benefits of Feraheme infusion were reviewed today including arthralgias, myalgias and infusion related complications.  6. Autoimmune surveillance: He did develop hypothyroidism and currently on 50 g of Synthroid. Referral to endocrinology is pending.  7. Pulmonary toxicity surveillance: His CT scan did not show any interstitial lung disease he has minimal symptoms. We will withhold Pembrolizumab given his increased cough and dyspnea. Repeat imaging studies will be 60 weeks.  8. Follow-up: Will be in 3 weeks to follow his progress.  Zola Button, MD 9/6/20189:39 AM

## 2017-03-08 ENCOUNTER — Other Ambulatory Visit: Payer: Self-pay | Admitting: Hematology and Oncology

## 2017-03-08 NOTE — Telephone Encounter (Signed)
FYI

## 2017-03-10 ENCOUNTER — Ambulatory Visit (HOSPITAL_BASED_OUTPATIENT_CLINIC_OR_DEPARTMENT_OTHER): Payer: Medicare Other | Admitting: Medical

## 2017-03-10 VITALS — BP 99/52 | HR 73 | Temp 99.5°F | Resp 18 | Wt 179.1 lb

## 2017-03-10 DIAGNOSIS — C689 Malignant neoplasm of urinary organ, unspecified: Secondary | ICD-10-CM

## 2017-03-10 DIAGNOSIS — D649 Anemia, unspecified: Secondary | ICD-10-CM | POA: Diagnosis not present

## 2017-03-10 DIAGNOSIS — Z95828 Presence of other vascular implants and grafts: Secondary | ICD-10-CM

## 2017-03-10 MED ORDER — SODIUM CHLORIDE 0.9 % IV SOLN
Freq: Once | INTRAVENOUS | Status: AC
  Administered 2017-03-10: 14:00:00 via INTRAVENOUS

## 2017-03-10 MED ORDER — SODIUM CHLORIDE 0.9 % IV SOLN
510.0000 mg | Freq: Once | INTRAVENOUS | Status: AC
  Administered 2017-03-10: 510 mg via INTRAVENOUS
  Filled 2017-03-10: qty 17

## 2017-03-10 MED ORDER — HEPARIN SOD (PORK) LOCK FLUSH 100 UNIT/ML IV SOLN
500.0000 [IU] | Freq: Once | INTRAVENOUS | Status: AC | PRN
  Administered 2017-03-10: 500 [IU] via INTRAVENOUS
  Filled 2017-03-10: qty 5

## 2017-03-10 MED ORDER — HEPARIN SOD (PORK) LOCK FLUSH 100 UNIT/ML IV SOLN
250.0000 [IU] | Freq: Once | INTRAVENOUS | Status: DC | PRN
  Filled 2017-03-10: qty 5

## 2017-03-10 MED ORDER — SODIUM CHLORIDE 0.9% FLUSH
10.0000 mL | INTRAVENOUS | Status: DC | PRN
  Administered 2017-03-10: 10 mL
  Filled 2017-03-10: qty 10

## 2017-03-10 MED ORDER — ALTEPLASE 2 MG IJ SOLR
2.0000 mg | Freq: Once | INTRAMUSCULAR | Status: DC | PRN
  Filled 2017-03-10: qty 2

## 2017-03-10 NOTE — Progress Notes (Signed)
RN visit only for fereheme infusion.  Pt tolerated well. Pt. Had temp of 99.5 post infusion with BP 99/52.  Spoke with Sandi Mealy, PA re: Lauralyn Primes to go home per Sandi Mealy and Dr. [name]. Reviewed discharge instructions with pt and wife regarding post fereheme side effects. They voiced understanding.

## 2017-03-10 NOTE — Patient Instructions (Signed)

## 2017-03-14 ENCOUNTER — Other Ambulatory Visit: Payer: Self-pay | Admitting: *Deleted

## 2017-03-14 ENCOUNTER — Telehealth: Payer: Self-pay | Admitting: *Deleted

## 2017-03-14 DIAGNOSIS — C689 Malignant neoplasm of urinary organ, unspecified: Secondary | ICD-10-CM

## 2017-03-14 NOTE — Telephone Encounter (Signed)
Left message for patient to call [facility] regarding appointments.

## 2017-03-17 ENCOUNTER — Other Ambulatory Visit (HOSPITAL_BASED_OUTPATIENT_CLINIC_OR_DEPARTMENT_OTHER): Payer: Medicare Other

## 2017-03-17 ENCOUNTER — Ambulatory Visit (HOSPITAL_BASED_OUTPATIENT_CLINIC_OR_DEPARTMENT_OTHER): Payer: Medicare Other

## 2017-03-17 VITALS — BP 110/68 | HR 78 | Temp 98.4°F | Resp 18

## 2017-03-17 DIAGNOSIS — Z95828 Presence of other vascular implants and grafts: Secondary | ICD-10-CM

## 2017-03-17 DIAGNOSIS — D649 Anemia, unspecified: Secondary | ICD-10-CM

## 2017-03-17 DIAGNOSIS — C642 Malignant neoplasm of left kidney, except renal pelvis: Secondary | ICD-10-CM

## 2017-03-17 DIAGNOSIS — C7800 Secondary malignant neoplasm of unspecified lung: Secondary | ICD-10-CM | POA: Diagnosis not present

## 2017-03-17 DIAGNOSIS — C689 Malignant neoplasm of urinary organ, unspecified: Secondary | ICD-10-CM

## 2017-03-17 LAB — CBC WITH DIFFERENTIAL/PLATELET
BASO%: 0.7 % (ref 0.0–2.0)
BASOS ABS: 0.1 10*3/uL (ref 0.0–0.1)
EOS ABS: 0.2 10*3/uL (ref 0.0–0.5)
EOS%: 1.8 % (ref 0.0–7.0)
HEMATOCRIT: 27.9 % — AB (ref 38.4–49.9)
HEMOGLOBIN: 9.1 g/dL — AB (ref 13.0–17.1)
LYMPH#: 1 10*3/uL (ref 0.9–3.3)
LYMPH%: 8.5 % — ABNORMAL LOW (ref 14.0–49.0)
MCH: 30.2 pg (ref 27.2–33.4)
MCHC: 32.7 g/dL (ref 32.0–36.0)
MCV: 92.2 fL (ref 79.3–98.0)
MONO#: 1.1 10*3/uL — ABNORMAL HIGH (ref 0.1–0.9)
MONO%: 9.7 % (ref 0.0–14.0)
NEUT#: 9.3 10*3/uL — ABNORMAL HIGH (ref 1.5–6.5)
NEUT%: 79.3 % — AB (ref 39.0–75.0)
PLATELETS: 196 10*3/uL (ref 140–400)
RBC: 3.03 10*6/uL — ABNORMAL LOW (ref 4.20–5.82)
RDW: 15 % — AB (ref 11.0–14.6)
WBC: 11.7 10*3/uL — ABNORMAL HIGH (ref 4.0–10.3)

## 2017-03-17 MED ORDER — HEPARIN SOD (PORK) LOCK FLUSH 100 UNIT/ML IV SOLN
500.0000 [IU] | Freq: Once | INTRAVENOUS | Status: AC | PRN
  Administered 2017-03-17: 500 [IU]
  Filled 2017-03-17: qty 5

## 2017-03-17 MED ORDER — SODIUM CHLORIDE 0.9 % IV SOLN
Freq: Once | INTRAVENOUS | Status: AC
  Administered 2017-03-17: 15:00:00 via INTRAVENOUS

## 2017-03-17 MED ORDER — FERUMOXYTOL INJECTION 510 MG/17 ML
510.0000 mg | Freq: Once | INTRAVENOUS | Status: AC
  Administered 2017-03-17: 510 mg via INTRAVENOUS
  Filled 2017-03-17: qty 17

## 2017-03-17 MED ORDER — SODIUM CHLORIDE 0.9% FLUSH
10.0000 mL | INTRAVENOUS | Status: DC | PRN
  Administered 2017-03-17: 10 mL
  Filled 2017-03-17: qty 10

## 2017-03-17 NOTE — Patient Instructions (Signed)

## 2017-03-17 NOTE — Progress Notes (Signed)
Pt reports intermittent bleeding in urine and that MD was notified on 03/14/17. Per Erline Levine per Dr. [name] pt does not require a blood transfusion and to notify clinic if bleeding worsens and keep blue arm band on until 03/19/17 in case bleeding worsens or transfusion is needed. Pt aware and verbalizes understanding.

## 2017-03-24 ENCOUNTER — Ambulatory Visit: Payer: Medicare Other

## 2017-03-24 ENCOUNTER — Ambulatory Visit (HOSPITAL_BASED_OUTPATIENT_CLINIC_OR_DEPARTMENT_OTHER): Payer: Medicare Other

## 2017-03-24 ENCOUNTER — Ambulatory Visit (HOSPITAL_BASED_OUTPATIENT_CLINIC_OR_DEPARTMENT_OTHER): Payer: Medicare Other | Admitting: Oncology

## 2017-03-24 ENCOUNTER — Telehealth: Payer: Self-pay | Admitting: Oncology

## 2017-03-24 ENCOUNTER — Other Ambulatory Visit (HOSPITAL_BASED_OUTPATIENT_CLINIC_OR_DEPARTMENT_OTHER): Payer: Medicare Other

## 2017-03-24 VITALS — BP 108/55 | HR 72 | Temp 98.3°F | Resp 19 | Ht 70.0 in | Wt 174.5 lb

## 2017-03-24 DIAGNOSIS — Z95828 Presence of other vascular implants and grafts: Secondary | ICD-10-CM

## 2017-03-24 DIAGNOSIS — D649 Anemia, unspecified: Secondary | ICD-10-CM

## 2017-03-24 DIAGNOSIS — C7800 Secondary malignant neoplasm of unspecified lung: Secondary | ICD-10-CM | POA: Diagnosis not present

## 2017-03-24 DIAGNOSIS — C642 Malignant neoplasm of left kidney, except renal pelvis: Secondary | ICD-10-CM

## 2017-03-24 DIAGNOSIS — C689 Malignant neoplasm of urinary organ, unspecified: Secondary | ICD-10-CM

## 2017-03-24 DIAGNOSIS — R05 Cough: Secondary | ICD-10-CM | POA: Diagnosis not present

## 2017-03-24 DIAGNOSIS — R5383 Other fatigue: Secondary | ICD-10-CM | POA: Diagnosis not present

## 2017-03-24 DIAGNOSIS — R0609 Other forms of dyspnea: Secondary | ICD-10-CM | POA: Diagnosis not present

## 2017-03-24 LAB — CBC WITH DIFFERENTIAL/PLATELET
BASO%: 0.3 % (ref 0.0–2.0)
BASOS ABS: 0 10*3/uL (ref 0.0–0.1)
EOS ABS: 0.2 10*3/uL (ref 0.0–0.5)
EOS%: 1.5 % (ref 0.0–7.0)
HEMATOCRIT: 29.8 % — AB (ref 38.4–49.9)
HEMOGLOBIN: 9.4 g/dL — AB (ref 13.0–17.1)
LYMPH#: 0.9 10*3/uL (ref 0.9–3.3)
LYMPH%: 7.4 % — ABNORMAL LOW (ref 14.0–49.0)
MCH: 30.8 pg (ref 27.2–33.4)
MCHC: 31.5 g/dL — ABNORMAL LOW (ref 32.0–36.0)
MCV: 97.7 fL (ref 79.3–98.0)
MONO#: 0.9 10*3/uL (ref 0.1–0.9)
MONO%: 8 % (ref 0.0–14.0)
NEUT#: 9.7 10*3/uL — ABNORMAL HIGH (ref 1.5–6.5)
NEUT%: 82.8 % — ABNORMAL HIGH (ref 39.0–75.0)
Platelets: 162 10*3/uL (ref 140–400)
RBC: 3.05 10*6/uL — ABNORMAL LOW (ref 4.20–5.82)
RDW: 16.6 % — AB (ref 11.0–14.6)
WBC: 11.7 10*3/uL — ABNORMAL HIGH (ref 4.0–10.3)

## 2017-03-24 LAB — COMPREHENSIVE METABOLIC PANEL
ALBUMIN: 2.9 g/dL — AB (ref 3.5–5.0)
ALT: 13 U/L (ref 0–55)
AST: 20 U/L (ref 5–34)
Alkaline Phosphatase: 71 U/L (ref 40–150)
Anion Gap: 7 mEq/L (ref 3–11)
BUN: 16.9 mg/dL (ref 7.0–26.0)
CALCIUM: 11 mg/dL — AB (ref 8.4–10.4)
CO2: 27 mEq/L (ref 22–29)
Chloride: 102 mEq/L (ref 98–109)
Creatinine: 1.3 mg/dL (ref 0.7–1.3)
EGFR: 53 mL/min/{1.73_m2} — AB (ref >90)
Glucose: 151 mg/dl — ABNORMAL HIGH (ref 70–140)
POTASSIUM: 3.9 meq/L (ref 3.5–5.1)
Sodium: 136 mEq/L (ref 136–145)
Total Bilirubin: 0.76 mg/dL (ref 0.20–1.20)
Total Protein: 6.4 g/dL (ref 6.4–8.3)

## 2017-03-24 MED ORDER — ZOLEDRONIC ACID 4 MG/100ML IV SOLN
4.0000 mg | Freq: Once | INTRAVENOUS | Status: AC
  Administered 2017-03-24: 4 mg via INTRAVENOUS
  Filled 2017-03-24: qty 100

## 2017-03-24 MED ORDER — SODIUM CHLORIDE 0.9 % IJ SOLN
10.0000 mL | INTRAMUSCULAR | Status: DC | PRN
  Administered 2017-03-24: 10 mL via INTRAVENOUS
  Filled 2017-03-24: qty 10

## 2017-03-24 MED ORDER — LEVOTHYROXINE SODIUM 75 MCG PO TABS: 75.0000 ug | ORAL_TABLET | Freq: Every day | ORAL | 3 refills | Status: DC

## 2017-03-24 MED ORDER — HEPARIN SOD (PORK) LOCK FLUSH 100 UNIT/ML IV SOLN
500.0000 [IU] | Freq: Once | INTRAVENOUS | Status: AC | PRN
Start: 2017-03-24 — End: 2017-03-24
  Administered 2017-03-24: 500 [IU]
  Filled 2017-03-24: qty 5

## 2017-03-24 MED ORDER — SODIUM CHLORIDE 0.9% FLUSH
10.0000 mL | INTRAVENOUS | Status: DC | PRN
  Administered 2017-03-24: 10 mL
  Filled 2017-03-24: qty 10

## 2017-03-24 MED ORDER — BENZONATATE 200 MG PO CAPS: 200.0000 mg | ORAL_CAPSULE | Freq: Three times a day (TID) | ORAL | 3 refills | Status: DC | PRN

## 2017-03-24 MED ORDER — SODIUM CHLORIDE 0.9 % IV SOLN
Freq: Once | INTRAVENOUS | Status: AC
  Administered 2017-03-24: 10:00:00 via INTRAVENOUS

## 2017-03-24 NOTE — Progress Notes (Addendum)
Hematology and Oncology Follow Up Visit  [name] [name] [id number] 1945-09-26 71 y.o. 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 He presented with 4.9 cm mass of the upper pole of the left kidney with documented pulmonary metastasis. Neurological confirmation done on 04/15/2015 with a biopsy showed urothelial carcinoma.   Prior Therapy:   Status post biopsy of the left renal mass done on 04/15/2015. He is status post Port-A-Cath insertion on 05/21/2015. Systemic chemotherapy in the form of cisplatin and gemcitabine started on 05/27/2015. He is status post 6 cycles completed on 09/16/2015. CT scan on 01/06/2016 showed progression of disease.  Current therapy: Pembrolizumab 200 mg every 3 weeks cycle 1 on 02/13/2016.   Interim History:  [name] [name] presents today for a follow-up visit. Since the last visit, he continues to report mild decline in his overall health. He is reporting more fatigue, tiredness and mild cough. He is reporting some mild dyspnea on exertion. He is reporting increased in his nonproductive dry cough. He did report hematuria that has resolved in the last 10 days. He did receive intravenous iron in the last 2 weeks without any changes in his performance status or activity level. He reported decline in his energy and his exercise tolerance.   He does not report any headaches, blurry vision, syncope or seizures. He does not report any fevers, chills, or sweats. He does not report any chest pain, palpitation, orthopnea or leg edema. He does not report any wheezing or hemoptysis. He does not report any  vomiting, abdominal pain does report occasional dyspepsia. He does not report any frequency, urgency or hesitancy. He does not report any skeletal complaints. Remaining review of systems unremarkable.   Medications: I have reviewed the  patient's current medications.  Current Outpatient Prescriptions  Medication Sig Dispense Refill  . acetaminophen (TYLENOL) 325 MG tablet Take 650 mg by mouth every 6 (six) hours as needed for moderate pain or headache.     [name] atorvastatin (LIPITOR) 40 MG tablet Take 1 tablet (40 mg total) by mouth daily. (Patient taking differently: Take 40 mg by mouth every morning. ) 90 tablet 3  . benzonatate (TESSALON) 200 MG capsule Take 1 capsule (200 mg total) by mouth 3 (three) times daily as needed for cough. 30 capsule 0  . benzonatate (TESSALON) 200 MG capsule TAKE ONE CAPSULE BY MOUTH 3 TIMES A DAY AS NEEDED FOR COUGH 30 capsule 0  . Cholecalciferol (VITAMIN D) 1000 UNITS capsule Take 1,000 Units by mouth daily.      . diphenhydrAMINE (BENADRYL) 25 MG tablet Take 25 mg by mouth every 6 (six) hours as needed.    . docusate sodium (COLACE) 100 MG capsule Take 100 mg by mouth 2 (two) times daily as needed (constipation).    . famotidine (PEPCID) 20 MG tablet Take 1 tablet (20 mg total) by mouth daily. 30 tablet 2  . ferrous sulfate 325 (65 FE) MG EC tablet Take 1 tablet (325 mg total) by mouth 2 (two) times daily. 60 tablet 3  . levothyroxine (SYNTHROID) 50 MCG tablet Take 1 tablet (50 mcg total) by mouth daily before breakfast. 90 tablet 2  . lidocaine-prilocaine (EMLA) cream Apply to port-a-cath 1-2 hours prior to acces. Cover with saran wrap. 30 g 1  . lisinopril (PRINIVIL,ZESTRIL) 5 MG tablet     . metoprolol tartrate (LOPRESSOR) 25 MG tablet Take 0.5 tablets (  12.5 mg total) by mouth 2 (two) times daily. 90 tablet 3  . nitroGLYCERIN (NITROSTAT) 0.4 MG SL tablet Place 1 tablet (0.4 mg total) under the tongue every 5 (five) minutes as needed. May repeat up to 3 doses. 100 tablet 3  . polycarbophil (FIBERCON) 625 MG tablet Take 625 mg by mouth 2 (two) times daily.     [name] PRESCRIPTION MEDICATION Inject 160 mg into the muscle daily. Gentamycin given for 3 days at [facility]    . sildenafil (VIAGRA) 50  MG tablet Take 50 mg by mouth as needed for erectile dysfunction.      No current facility-administered medications for this visit.      Allergies:  Allergies  Allergen Reactions  . Hydrocodone Other (See Comments)    Became too sedated    Past Medical History, Surgical history, Social history, and Family History were reviewed and updated.   Physical Exam: Blood pressure (!) 108/55, pulse 72, temperature 98.3 F (36.8 C), temperature source Oral, resp. rate 19, height 5\' 10"  (1.778 m), weight 174 lb 8 oz (79.2 kg), SpO2 98 %. ECOG: 1 General appearance: Well-appearing gentleman appeared fatigued. Head: Normocephalic, without obvious abnormality no oral ulcers or lesions. Neck: no adenopathy no thyroid masses. Lymph nodes: Cervical, supraclavicular, and axillary nodes normal. Heart:regular rate and rhythm, S1, S2 normal, no murmur, click, rub or gallop Lung:chest clear, no wheezing, rales, normal symmetric air entry Abdomin: soft, non-tender, without masses or organomegaly no rebound or guarding. EXT:no erythema, induration, or nodules Skin: No rashes or petechiae.  Lab Results: Lab Results  Component Value Date   WBC 11.7 (H) 03/24/2017   HGB 9.4 (L) 03/24/2017   HCT 29.8 (L) 03/24/2017   MCV 97.7 03/24/2017   PLT 162 03/24/2017     Chemistry      Component Value Date/Time   NA 136 03/24/2017 0842   K 3.9 03/24/2017 0842   CL 103 07/20/2016 0843   CO2 27 03/24/2017 0842   BUN 16.9 03/24/2017 0842   CREATININE 1.3 03/24/2017 0842      Component Value Date/Time   CALCIUM 11.0 (H) 03/24/2017 0842   ALKPHOS 71 03/24/2017 0842   AST 20 03/24/2017 0842   ALT 13 03/24/2017 0842   BILITOT 0.76 03/24/2017 3857        71 year old gentleman with the following issues:  1. Transitional cell carcinoma of the left genitourinary tract presented with a 4.9 cm tumor in the upper pole of the left kidney and documented pulmonary metastasis based on a PET CT scan obtained on  05/06/2015. He did have a pathological confirmation done on 04/15/2015 with the specimen showed urothelial carcinoma.  He is status post 6 cycles of therapy completed in March 2017. CT scan on 04/11/2017showed continuous response to therapy. He had further decrease in his bilateral pulmonary nodules as well as decrease in the left upper pole renal lesion.   CT scan on 01/06/2016 showed mild progression of disease especially in his primary tumor. His pulmonary nodules have remained stable.   He is currently on Pembrolizumab.   CT scan on 01/18/2017 showed response overall with very minimal residual disease compared to his original CT scans. His primary tumor continues to grow however.  The plan is to continue to withhold treatment for the time being and restaging in the immediate future.   2. Fatigue: Appears to be multifactorial related to Pembrolizumab, hypothyroidism and anemia. I will discontinue Pembrolizumab for the time being till his CT scan is completed.  3. IV access: Port-A-Cath remains in place without complications.  4. Hematuria: Improved at this time and nearly resolved.   5. Anemia: Hemoglobin appears adequate without any transfusion.  6. Autoimmune surveillance: He did develop hypothyroidism and currently on 50 g of Synthroid.  I will increase that to 75 g.Referral to endocrinology is pending.  7. Pulmonary toxicity surveillance: CT scan will help determining whether he has any interstitial lung disease. We discussed  adding steroids if any is detected.  8. Follow-up: Will be in 3 weeks to follow his progress.  Zola Button, MD 9/27/20189:25 AM   His calcium level appears to be elevated with low albumin which could explain some of his symptoms. I discussed with him the risks and benefits of Zometa infusion. Complications include infusion related toxicities, arthralgias, myalgias lives. He is agreeable to proceed with this infusion today.  Zola Button  MD 03/24/17

## 2017-03-24 NOTE — Addendum Note (Signed)
Addended by: Wyatt Portela on: 03/24/2017 09:50 AM   Modules accepted: Orders

## 2017-03-24 NOTE — Telephone Encounter (Signed)
Gave patient AVS and calendar of upcoming October appointments °

## 2017-03-24 NOTE — Patient Instructions (Signed)

## 2017-03-28 ENCOUNTER — Other Ambulatory Visit: Payer: Self-pay | Admitting: *Deleted

## 2017-03-28 DIAGNOSIS — C689 Malignant neoplasm of urinary organ, unspecified: Secondary | ICD-10-CM

## 2017-03-31 ENCOUNTER — Ambulatory Visit: Payer: Medicare Other

## 2017-03-31 ENCOUNTER — Other Ambulatory Visit (HOSPITAL_BASED_OUTPATIENT_CLINIC_OR_DEPARTMENT_OTHER): Payer: Medicare Other

## 2017-03-31 ENCOUNTER — Ambulatory Visit (HOSPITAL_COMMUNITY)
Admission: RE | Admit: 2017-03-31 | Discharge: 2017-03-31 | Disposition: A | Discharge status: Home / Self Care | Payer: Medicare Other | Source: Ambulatory Visit | Attending: Oncology | Admitting: Oncology

## 2017-03-31 DIAGNOSIS — R918 Other nonspecific abnormal finding of lung field: Secondary | ICD-10-CM | POA: Diagnosis not present

## 2017-03-31 DIAGNOSIS — C689 Malignant neoplasm of urinary organ, unspecified: Secondary | ICD-10-CM | POA: Diagnosis not present

## 2017-03-31 DIAGNOSIS — Z79899 Other long term (current) drug therapy: Secondary | ICD-10-CM | POA: Diagnosis not present

## 2017-03-31 DIAGNOSIS — C7800 Secondary malignant neoplasm of unspecified lung: Secondary | ICD-10-CM

## 2017-03-31 DIAGNOSIS — N2889 Other specified disorders of kidney and ureter: Secondary | ICD-10-CM | POA: Insufficient documentation

## 2017-03-31 DIAGNOSIS — C642 Malignant neoplasm of left kidney, except renal pelvis: Secondary | ICD-10-CM

## 2017-03-31 DIAGNOSIS — Z95828 Presence of other vascular implants and grafts: Secondary | ICD-10-CM

## 2017-03-31 DIAGNOSIS — C772 Secondary and unspecified malignant neoplasm of intra-abdominal lymph nodes: Secondary | ICD-10-CM | POA: Insufficient documentation

## 2017-03-31 DIAGNOSIS — C7801 Secondary malignant neoplasm of right lung: Secondary | ICD-10-CM | POA: Diagnosis not present

## 2017-03-31 DIAGNOSIS — C679 Malignant neoplasm of bladder, unspecified: Secondary | ICD-10-CM | POA: Diagnosis not present

## 2017-03-31 LAB — CBC WITH DIFFERENTIAL/PLATELET
BASO%: 0.3 % (ref 0.0–2.0)
BASOS ABS: 0 10*3/uL (ref 0.0–0.1)
EOS ABS: 0.2 10*3/uL (ref 0.0–0.5)
EOS%: 1.8 % (ref 0.0–7.0)
HCT: 29.2 % — ABNORMAL LOW (ref 38.4–49.9)
HGB: 9.3 g/dL — ABNORMAL LOW (ref 13.0–17.1)
LYMPH%: 7.6 % — AB (ref 14.0–49.0)
MCH: 30.7 pg (ref 27.2–33.4)
MCHC: 31.8 g/dL — ABNORMAL LOW (ref 32.0–36.0)
MCV: 96.4 fL (ref 79.3–98.0)
MONO#: 1.2 10*3/uL — ABNORMAL HIGH (ref 0.1–0.9)
MONO%: 10.1 % (ref 0.0–14.0)
NEUT%: 80.2 % — ABNORMAL HIGH (ref 39.0–75.0)
NEUTROS ABS: 9.5 10*3/uL — AB (ref 1.5–6.5)
Platelets: 171 10*3/uL (ref 140–400)
RBC: 3.03 10*6/uL — AB (ref 4.20–5.82)
RDW: 16.3 % — ABNORMAL HIGH (ref 11.0–14.6)
WBC: 11.8 10*3/uL — AB (ref 4.0–10.3)
lymph#: 0.9 10*3/uL (ref 0.9–3.3)

## 2017-03-31 LAB — COMPREHENSIVE METABOLIC PANEL
ALT: 11 U/L (ref 0–55)
AST: 18 U/L (ref 5–34)
Albumin: 2.8 g/dL — ABNORMAL LOW (ref 3.5–5.0)
Alkaline Phosphatase: 75 U/L (ref 40–150)
Anion Gap: 9 mEq/L (ref 3–11)
BILIRUBIN TOTAL: 0.58 mg/dL (ref 0.20–1.20)
BUN: 14.7 mg/dL (ref 7.0–26.0)
CO2: 20 mEq/L — ABNORMAL LOW (ref 22–29)
CREATININE: 1.6 mg/dL — AB (ref 0.7–1.3)
Calcium: 8.1 mg/dL — ABNORMAL LOW (ref 8.4–10.4)
Chloride: 106 mEq/L (ref 98–109)
EGFR: 43 mL/min/{1.73_m2} — ABNORMAL LOW (ref >90)
Glucose: 150 mg/dl — ABNORMAL HIGH (ref 70–140)
Potassium: 3.5 mEq/L (ref 3.5–5.1)
SODIUM: 135 meq/L — AB (ref 136–145)
Total Protein: 6.2 g/dL — ABNORMAL LOW (ref 6.4–8.3)

## 2017-03-31 LAB — TSH: TSH: 47.62 m[IU]/L — AB (ref 0.320–4.118)

## 2017-03-31 MED ORDER — IOPAMIDOL (ISOVUE-300) INJECTION 61%
100.0000 mL | Freq: Once | INTRAVENOUS | Status: AC | PRN
  Administered 2017-03-31: 100 mL via INTRAVENOUS

## 2017-03-31 MED ORDER — HEPARIN SOD (PORK) LOCK FLUSH 100 UNIT/ML IV SOLN
INTRAVENOUS | Status: AC
  Filled 2017-03-31: qty 5

## 2017-03-31 MED ORDER — SODIUM CHLORIDE 0.9% FLUSH
10.0000 mL | INTRAVENOUS | Status: DC | PRN
  Administered 2017-03-31: 10 mL
  Filled 2017-03-31: qty 10

## 2017-03-31 MED ORDER — IOPAMIDOL (ISOVUE-300) INJECTION 61%
INTRAVENOUS | Status: AC
  Filled 2017-03-31: qty 100

## 2017-03-31 MED ORDER — HEPARIN SOD (PORK) LOCK FLUSH 100 UNIT/ML IV SOLN
500.0000 [IU] | Freq: Once | INTRAVENOUS | Status: AC
  Administered 2017-03-31: 500 [IU]

## 2017-04-04 ENCOUNTER — Telehealth: Payer: Self-pay | Admitting: Oncology

## 2017-04-04 ENCOUNTER — Ambulatory Visit (HOSPITAL_BASED_OUTPATIENT_CLINIC_OR_DEPARTMENT_OTHER): Payer: Medicare Other | Admitting: Oncology

## 2017-04-04 VITALS — BP 106/57 | HR 76 | Temp 98.0°F | Resp 17 | Ht 70.0 in | Wt 173.1 lb

## 2017-04-04 DIAGNOSIS — D649 Anemia, unspecified: Secondary | ICD-10-CM | POA: Diagnosis not present

## 2017-04-04 DIAGNOSIS — C642 Malignant neoplasm of left kidney, except renal pelvis: Secondary | ICD-10-CM

## 2017-04-04 DIAGNOSIS — R05 Cough: Secondary | ICD-10-CM

## 2017-04-04 DIAGNOSIS — C7800 Secondary malignant neoplasm of unspecified lung: Secondary | ICD-10-CM | POA: Diagnosis not present

## 2017-04-04 DIAGNOSIS — C689 Malignant neoplasm of urinary organ, unspecified: Secondary | ICD-10-CM

## 2017-04-04 MED ORDER — TRAMADOL HCL 50 MG PO TABS: 50.0000 mg | ORAL_TABLET | Freq: Four times a day (QID) | ORAL | 0 refills | Status: AC | PRN

## 2017-04-04 MED ORDER — PROCHLORPERAZINE MALEATE 10 MG PO TABS: 10.0000 mg | ORAL_TABLET | Freq: Four times a day (QID) | ORAL | 0 refills | Status: DC | PRN

## 2017-04-04 NOTE — Telephone Encounter (Signed)
Called patient to give an update on November 7th

## 2017-04-04 NOTE — Progress Notes (Signed)
START OFF PATHWAY REGIMEN - Bladder   [id number]:Carboplatin + Gemcitabine (10/998) q21 Days:   A cycle is every 21 days:     Carboplatin      Gemcitabine   **Always confirm dose/schedule in your pharmacy ordering system**    Patient Characteristics: Metastatic Disease, Third Line and Beyond AJCC M Category: M0 AJCC N Category: NX AJCC T Category: TX Current evidence of distant metastases<= Yes AJCC 8 Stage Grouping: IVB Line of therapy: Third Line and Beyond Would you be surprised if this patient died  in the next year<= I would be surprised if this patient died in the next year Intent of Therapy: Non-Curative / Palliative Intent, Discussed with Patient

## 2017-04-04 NOTE — Telephone Encounter (Signed)
Gave avs and calendar for October and November  °

## 2017-04-04 NOTE — Progress Notes (Signed)
Hematology and Oncology Follow Up Visit  [name] [name] [id number] 11/23/1945 71 y.o. 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 He presented with 4.9 cm mass of the upper pole of the left kidney with documented pulmonary metastasis. Neurological confirmation done on 04/15/2015 with a biopsy showed urothelial carcinoma.   Prior Therapy:   Status post biopsy of the left renal mass done on 04/15/2015. He is status post Port-A-Cath insertion on 05/21/2015. Systemic chemotherapy in the form of cisplatin and gemcitabine started on 05/27/2015. He is status post 6 cycles completed on 09/16/2015. CT scan on 01/06/2016 showed progression of disease. Pembrolizumab 200 mg every 3 weeks cycle 1 on 02/13/2016. Therapy concluded in October 2018 because of progression of disease.  Current therapy: Under consideration to start carboplatin and gemcitabine as a salvage therapy.  Interim History:  [name] presents today for a follow-up visit. Since the last visit, he reports slight improvement in his health. He received Zometa for hypercalcemia which improved his energy and fatigue. He is no longer reporting any confusion or constipation. He still reports cold intolerance and nonproductive cough. He denied any hematochezia or melena. He denied any hemoptysis. He denied any hematuria. He does report left sided flank pain which has increased slightly since last visit. He takes Tylenol which has been less effective. He still ambulating without any difficulties and performance status remained the same.  He does not report any headaches, blurry vision, syncope or seizures. He does not report any fevers, chills, or sweats. He does not report any chest pain, palpitation, orthopnea or leg edema. He does not report any wheezing or hemoptysis. He does not report any  vomiting,  abdominal pain does report occasional dyspepsia. He does not report any frequency, urgency or hesitancy. He does not report any skeletal complaints. Remaining review of systems unremarkable.   Medications: I have reviewed the patient's current medications.  Current Outpatient Prescriptions  Medication Sig Dispense Refill  . acetaminophen (TYLENOL) 325 MG tablet Take 650 mg by mouth every 6 (six) hours as needed for moderate pain or headache.     [name] atorvastatin (LIPITOR) 40 MG tablet Take 1 tablet (40 mg total) by mouth daily. (Patient taking differently: Take 40 mg by mouth every morning. ) 90 tablet 3  . benzonatate (TESSALON) 200 MG capsule TAKE ONE CAPSULE BY MOUTH 3 TIMES A DAY AS NEEDED FOR COUGH 30 capsule 0  . benzonatate (TESSALON) 200 MG capsule Take 1 capsule (200 mg total) by mouth 3 (three) times daily as needed for cough. 90 capsule 3  . Cholecalciferol (VITAMIN D) 1000 UNITS capsule Take 1,000 Units by mouth daily.      . diphenhydrAMINE (BENADRYL) 25 MG tablet Take 25 mg by mouth every 6 (six) hours as needed.    . docusate sodium (COLACE) 100 MG capsule Take 100 mg by mouth 2 (two) times daily as needed (constipation).    . famotidine (PEPCID) 20 MG tablet Take 1 tablet (20 mg total) by mouth daily. 30 tablet 2  . ferrous sulfate 325 (65 FE) MG EC tablet Take 1 tablet (325 mg total) by mouth 2 (two) times daily. 60 tablet 3  . levothyroxine (SYNTHROID, LEVOTHROID) 75 MCG tablet Take 1 tablet (75 mcg total) by mouth daily before breakfast. 90 tablet 3  . lidocaine-prilocaine (EMLA) cream Apply to port-a-cath 1-2 hours prior to acces. Cover with saran  wrap. 30 g 1  . lisinopril (PRINIVIL,ZESTRIL) 5 MG tablet     . metoprolol tartrate (LOPRESSOR) 25 MG tablet Take 0.5 tablets (12.5 mg total) by mouth 2 (two) times daily. 90 tablet 3  . nitroGLYCERIN (NITROSTAT) 0.4 MG SL tablet Place 1 tablet (0.4 mg total) under the tongue every 5 (five) minutes as needed. May repeat up to 3 doses.  100 tablet 3  . polycarbophil (FIBERCON) 625 MG tablet Take 625 mg by mouth 2 (two) times daily.     [name] PRESCRIPTION MEDICATION Inject 160 mg into the muscle daily. Gentamycin given for 3 days at [facility]    . prochlorperazine (COMPAZINE) 10 MG tablet Take 1 tablet (10 mg total) by mouth every 6 (six) hours as needed for nausea or vomiting. 30 tablet 0  . sildenafil (VIAGRA) 50 MG tablet Take 50 mg by mouth as needed for erectile dysfunction.     . traMADol (ULTRAM) 50 MG tablet Take 1 tablet (50 mg total) by mouth every 6 (six) hours as needed. 30 tablet 0   No current facility-administered medications for this visit.      Allergies:  Allergies  Allergen Reactions  . Hydrocodone Other (See Comments)    Became too sedated    Past Medical History, Surgical history, Social history, and Family History were reviewed and updated.   Physical Exam: Blood pressure (!) 106/57, pulse 76, temperature 98 F (36.7 C), temperature source Oral, resp. rate 17, height 5\' 10"  (1.778 m), weight 173 lb 1.6 oz (78.5 kg), SpO2 99 %. ECOG: 1 General appearance: Alert, awake gentleman without distress. Head: Normocephalic, without obvious abnormality no oral ulcers. Neck: no adenopathy no thyroid masses. Lymph nodes: Cervical, supraclavicular, and axillary nodes normal. Heart:regular rate and rhythm, S1, S2 normal, no murmur, click, rub or gallop Lung:chest clear, no wheezing, rales, normal symmetric air entry Abdomin: soft, non-tender, without masses or organomegaly no shifting dullness or ascites. EXT:no erythema, induration, or nodules Skin: No rashes or petechiae.  Lab Results: Lab Results  Component Value Date   WBC 11.8 (H) 03/31/2017   HGB 9.3 (L) 03/31/2017   HCT 29.2 (L) 03/31/2017   MCV 96.4 03/31/2017   PLT 171 03/31/2017     Chemistry      Component Value Date/Time   NA 135 (L) 03/31/2017 0948   K 3.5 03/31/2017 0948   CL 103 07/20/2016 0843   CO2 20 (L) 03/31/2017 0948    BUN 14.7 03/31/2017 0948   CREATININE 1.6 (H) 03/31/2017 0948      Component Value Date/Time   CALCIUM 8.1 (L) 03/31/2017 0948   ALKPHOS 75 03/31/2017 0948   AST 18 03/31/2017 0948   ALT 11 03/31/2017 0948   BILITOT 0.58 03/31/2017 0948     EXAM: CT CHEST, ABDOMEN, AND PELVIS WITH CONTRAST  TECHNIQUE: Multidetector CT imaging of the chest, abdomen and pelvis was performed following the standard protocol during bolus administration of intravenous contrast.  CONTRAST:  80 mL Isovue 300 IV  COMPARISON:  01/18/2017  FINDINGS: CT CHEST FINDINGS  Cardiovascular: Heart is normal in size.  No pericardial effusion.  No evidence of thoracic aortic aneurysm. Atherosclerotic calcifications the aortic arch.  Three vessel coronary atherosclerosis. Postsurgical changes related to prior CABG.  Right chest port terminates at the cavoatrial junction.  Mediastinum/Nodes: Small mediastinal and bilateral hilar lymph nodes measuring up to 8 mm short axis. While technically within normal limits, these are mildly progressive, and the left perihilar node (series 11/image 50) is  partially necrotic. As such, small nodal metastases are favored.  No suspicious axillary lymphadenopathy.  Visualized thyroid is unremarkable.  Lungs/Pleura: 2.4 x 2.1 cm medial right lower lobe pulmonary nodule (series 15/ image 96), previously 1.6 x 1.8 cm, suspicious for pulmonary metastasis.  Additional 10 mm subpleural nodular opacity in the lateral left lung apex (series 15/ image 31), possibly reflecting subpleural scarring.  Mild subpleural scarring/ reticulation in the bilateral lower lobes. Mild paraseptal emphysematous changes, upper lobe predominant.  No focal consolidation.  No pleural effusion or pneumothorax.  Musculoskeletal: Degenerative changes of the visualized thoracolumbar spine. Median sternotomy.  CT ABDOMEN PELVIS FINDINGS  Hepatobiliary: 1.5 x 2.0 cm cyst  in segment 4 (series 6/ image 26). No suspicious/enhancing hepatic lesions.  Gallbladder is unremarkable. No intrahepatic or extrahepatic ductal dilatation.  Pancreas: Within normal limits.  Spleen: Heterogeneous enhancement of the spleen on the anterior or phase, although this normalizes on the delayed images, likely reflecting heterogeneous perfusion.  Adrenals/Urinary Tract: Adrenal glands are within normal limits.  8.4 x 7.9 cm infiltrating left upper pole renal mass (series 6/ image 44), previously 6.4 x 5.7 cm. Mass extends into the left renal vein (series 6/ image 48).  Associated para-aortic nodal metastases measuring up to 2.6 cm short axis (series 6/ image 53), previously 1.4 cm.  Bladder is underdistended but unremarkable.  Stomach/Bowel: Stomach is notable for a tiny hiatal hernia.  No evidence of bowel obstruction.  Normal appendix (series 11/ image 169).  Left colon is decompressed.  Vascular/Lymphatic: No evidence of abdominal aortic aneurysm.  Tumor extension into the left renal vein, as described above.  Atherosclerotic calcifications of the abdominal aorta and branch vessels.  Retroperitoneal/ para-aortic nodal metastases, as described above.  Reproductive: Prostate is grossly unremarkable.  Other: No abdominopelvic ascites.  Musculoskeletal: Mild degenerative changes of the lumbar spine.  IMPRESSION: 8.4 cm infiltrating left lower pole renal mass, progressed. Mass extends into the left renal vein, new.  Associated retroperitoneal/ para- aortic nodal metastases measuring up to 2.6 cm short axis, progressed.  2.4 cm right lower lobe pulmonary metastasis, progressed.  Small mediastinal/bilateral hilar lymph nodes measuring up to 8 mm short axis, mildly increased, suspicious for thoracic nodal metastases.    71 year old gentleman with the following issues:  1. Transitional cell carcinoma of the left genitourinary  tract presented with a 4.9 cm tumor in the upper pole of the left kidney and documented pulmonary metastasis based on a PET CT scan obtained on 05/06/2015. He did have a pathological confirmation done on 04/15/2015 with the specimen showed urothelial carcinoma.  He is status post 6 cycles of therapy completed in March 2017. CT scan on 04/11/2017showed continuous response to therapy. He had further decrease in his bilateral pulmonary nodules as well as decrease in the left upper pole renal lesion.   CT scan on 01/06/2016 showed mild progression of disease especially in his primary tumor. His pulmonary nodules have remained stable.   He is S/P Pembrolizumab between August 2017 and October 2018.  CT scan on 03/31/2017 was personally reviewed today and discussed with the patient. He has clear progression of disease at this time. Options of therapy were reviewed today which include different salvage chemotherapy versus supportive care only. His performance status remains adequate and would like to continue with aggressive therapy at this time. Options would include Taxotere chemotherapy, Alimta or expiratory treatment with platinum and gemcitabine. He understands the likelihood of clinical benefit is low associated with these regimens.  After discussion today, he  is agreeable to proceed with carboplatin and gemcitabine as salvage regimen. Complications include cytopenia, nausea, fatigue among others were discussed. Possible need for growth factor support and transfusions were also reviewed. Severe complications such as sepsis, hospitalization and death are rare but a possibility given his reatment in the past.  He will receive gemcitabine and carboplatin on day 1 with gemcitabine on day 8 of a 21 day cycle. Modification further surgery may be include eliminating gemcitabine and dose reductions if needed to.   2. Hypercalcemia: He received a Zometa on 03/24/2017 and his calcium was corrected.  3. IV  access: Port-A-Cath remains in place without complications.  4. Hematuria: Improved at this time and nearly resolved.   5. Anemia: Hemoglobin appears adequate without any transfusion.  6. Autoimmune surveillance: He did develop hypothyroidism and currently on 75 g of Synthroid. I will repeat his TSH and titrate higher dose. His endocrinology follow-up is pending.  7. Pulmonary toxicity surveillance: No interstitial lung disease noted at this time. I see no need for any steroids for the time being.  8. Follow-up: Will be in the next 2 weeks to start chemotherapy and  an M.D. follow-up on 04/20/2017.  Zola Button, MD 10/8/201810:38 AM

## 2017-04-11 ENCOUNTER — Encounter: Payer: Self-pay | Admitting: Pharmacist

## 2017-04-13 ENCOUNTER — Other Ambulatory Visit (HOSPITAL_BASED_OUTPATIENT_CLINIC_OR_DEPARTMENT_OTHER): Payer: Medicare Other

## 2017-04-13 ENCOUNTER — Telehealth: Payer: Self-pay

## 2017-04-13 ENCOUNTER — Ambulatory Visit: Payer: Medicare Other

## 2017-04-13 ENCOUNTER — Ambulatory Visit (HOSPITAL_BASED_OUTPATIENT_CLINIC_OR_DEPARTMENT_OTHER): Payer: Medicare Other

## 2017-04-13 VITALS — BP 120/64 | HR 71 | Temp 97.8°F | Resp 18

## 2017-04-13 DIAGNOSIS — C689 Malignant neoplasm of urinary organ, unspecified: Secondary | ICD-10-CM

## 2017-04-13 DIAGNOSIS — C7800 Secondary malignant neoplasm of unspecified lung: Secondary | ICD-10-CM

## 2017-04-13 DIAGNOSIS — Z5111 Encounter for antineoplastic chemotherapy: Secondary | ICD-10-CM | POA: Diagnosis not present

## 2017-04-13 DIAGNOSIS — Z95828 Presence of other vascular implants and grafts: Secondary | ICD-10-CM

## 2017-04-13 DIAGNOSIS — C642 Malignant neoplasm of left kidney, except renal pelvis: Secondary | ICD-10-CM

## 2017-04-13 DIAGNOSIS — Z79899 Other long term (current) drug therapy: Secondary | ICD-10-CM

## 2017-04-13 LAB — COMPREHENSIVE METABOLIC PANEL
ALK PHOS: 73 U/L (ref 40–150)
ALT: 7 U/L (ref 0–55)
ANION GAP: 10 meq/L (ref 3–11)
AST: 16 U/L (ref 5–34)
Albumin: 2.8 g/dL — ABNORMAL LOW (ref 3.5–5.0)
BILIRUBIN TOTAL: 0.72 mg/dL (ref 0.20–1.20)
BUN: 18 mg/dL (ref 7.0–26.0)
CALCIUM: 9.2 mg/dL (ref 8.4–10.4)
CO2: 22 mEq/L (ref 22–29)
CREATININE: 1.6 mg/dL — AB (ref 0.7–1.3)
Chloride: 102 mEq/L (ref 98–109)
EGFR: 43 mL/min/{1.73_m2} — ABNORMAL LOW (ref >60)
Glucose: 111 mg/dl (ref 70–140)
Potassium: 4 mEq/L (ref 3.5–5.1)
Sodium: 134 mEq/L — ABNORMAL LOW (ref 136–145)
TOTAL PROTEIN: 6.3 g/dL — AB (ref 6.4–8.3)

## 2017-04-13 LAB — CBC WITH DIFFERENTIAL/PLATELET
BASO%: 0.8 % (ref 0.0–2.0)
Basophils Absolute: 0.1 10*3/uL (ref 0.0–0.1)
EOS ABS: 0.2 10*3/uL (ref 0.0–0.5)
EOS%: 1.5 % (ref 0.0–7.0)
HCT: 28.9 % — ABNORMAL LOW (ref 38.4–49.9)
HGB: 9.5 g/dL — ABNORMAL LOW (ref 13.0–17.1)
LYMPH%: 6 % — AB (ref 14.0–49.0)
MCH: 30.9 pg (ref 27.2–33.4)
MCHC: 32.8 g/dL (ref 32.0–36.0)
MCV: 94 fL (ref 79.3–98.0)
MONO#: 1.1 10*3/uL — AB (ref 0.1–0.9)
MONO%: 9.4 % (ref 0.0–14.0)
NEUT#: 9.2 10*3/uL — ABNORMAL HIGH (ref 1.5–6.5)
NEUT%: 82.3 % — AB (ref 39.0–75.0)
PLATELETS: 193 10*3/uL (ref 140–400)
RBC: 3.08 10*6/uL — AB (ref 4.20–5.82)
RDW: 17.5 % — ABNORMAL HIGH (ref 11.0–14.6)
WBC: 11.2 10*3/uL — ABNORMAL HIGH (ref 4.0–10.3)
lymph#: 0.7 10*3/uL — ABNORMAL LOW (ref 0.9–3.3)

## 2017-04-13 LAB — TSH: TSH: 26.236 m[IU]/L — AB (ref 0.320–4.118)

## 2017-04-13 MED ORDER — PALONOSETRON HCL INJECTION 0.25 MG/5ML
INTRAVENOUS | Status: AC
  Filled 2017-04-13: qty 5

## 2017-04-13 MED ORDER — SODIUM CHLORIDE 0.9% FLUSH
10.0000 mL | INTRAVENOUS | Status: DC | PRN
  Administered 2017-04-13: 10 mL
  Filled 2017-04-13: qty 10

## 2017-04-13 MED ORDER — DEXAMETHASONE SODIUM PHOSPHATE 10 MG/ML IJ SOLN
10.0000 mg | Freq: Once | INTRAMUSCULAR | Status: AC
  Administered 2017-04-13: 10 mg via INTRAVENOUS

## 2017-04-13 MED ORDER — PALONOSETRON HCL INJECTION 0.25 MG/5ML
0.2500 mg | Freq: Once | INTRAVENOUS | Status: AC
  Administered 2017-04-13: 0.25 mg via INTRAVENOUS

## 2017-04-13 MED ORDER — SODIUM CHLORIDE 0.9 % IV SOLN
Freq: Once | INTRAVENOUS | Status: AC
  Administered 2017-04-13: 13:00:00 via INTRAVENOUS

## 2017-04-13 MED ORDER — SODIUM CHLORIDE 0.9 % IV SOLN
2000.0000 mg | Freq: Once | INTRAVENOUS | Status: AC
  Administered 2017-04-13: 2000 mg via INTRAVENOUS
  Filled 2017-04-13: qty 52.6

## 2017-04-13 MED ORDER — DEXAMETHASONE SODIUM PHOSPHATE 10 MG/ML IJ SOLN
INTRAMUSCULAR | Status: AC
  Filled 2017-04-13: qty 1

## 2017-04-13 MED ORDER — HEPARIN SOD (PORK) LOCK FLUSH 100 UNIT/ML IV SOLN
500.0000 [IU] | Freq: Once | INTRAVENOUS | Status: AC | PRN
  Administered 2017-04-13: 500 [IU]
  Filled 2017-04-13: qty 5

## 2017-04-13 MED ORDER — SODIUM CHLORIDE 0.9 % IV SOLN
353.5000 mg | Freq: Once | INTRAVENOUS | Status: AC
  Administered 2017-04-13: 350 mg via INTRAVENOUS
  Filled 2017-04-13: qty 35

## 2017-04-13 NOTE — Progress Notes (Signed)
Per MD Shadad okay to treat with creatinine of 1.6.

## 2017-04-13 NOTE — Telephone Encounter (Signed)
Error

## 2017-04-13 NOTE — Patient Instructions (Signed)
[facility] Discharge Instructions for Patients Receiving Chemotherapy  Today you received the following chemotherapy agents: Gemzar and Carboplatin.  To help prevent nausea and vomiting after your treatment, we encourage you to take your nausea medication as prescribed.    If you develop nausea and vomiting that is not controlled by your nausea medication, call the clinic.   BELOW ARE SYMPTOMS THAT SHOULD BE REPORTED IMMEDIATELY:  *FEVER GREATER THAN 100.5 F  *CHILLS WITH OR WITHOUT FEVER  NAUSEA AND VOMITING THAT IS NOT CONTROLLED WITH YOUR NAUSEA MEDICATION  *UNUSUAL SHORTNESS OF BREATH  *UNUSUAL BRUISING OR BLEEDING  TENDERNESS IN MOUTH AND THROAT WITH OR WITHOUT PRESENCE OF ULCERS  *URINARY PROBLEMS  *BOWEL PROBLEMS  UNUSUAL RASH Items with * indicate a potential emergency and should be followed up as soon as possible.  Feel free to call the clinic should you have any questions or concerns. The clinic phone number is [phone].  Please show the [place] at check-in to the Emergency Department and triage nurse.  Gemcitabine injection What is this medicine? GEMCITABINE (jem SIT a been) is a chemotherapy drug. This medicine is used to treat many types of cancer like breast cancer, lung cancer, pancreatic cancer, and ovarian cancer. This medicine may be used for other purposes; ask your health care provider or pharmacist if you have questions. COMMON BRAND NAME(S): Gemzar What should I tell my health care provider before I take this medicine? They need to know if you have any of these conditions: -blood disorders -infection -kidney disease -liver disease -recent or ongoing radiation therapy -an unusual or allergic reaction to gemcitabine, other chemotherapy, other medicines, foods, dyes, or preservatives -pregnant or trying to get pregnant -breast-feeding How should I use this medicine? This drug is given as an infusion into a vein. It is  administered in a hospital or clinic by a specially trained health care professional. Talk to your pediatrician regarding the use of this medicine in children. Special care may be needed. Overdosage: If you think you have taken too much of this medicine contact a poison control center or emergency room at once. NOTE: This medicine is only for you. Do not share this medicine with others. What if I miss a dose? It is important not to miss your dose. Call your doctor or health care professional if you are unable to keep an appointment. What may interact with this medicine? -medicines to increase blood counts like filgrastim, pegfilgrastim, sargramostim -some other chemotherapy drugs like cisplatin -vaccines Talk to your doctor or health care professional before taking any of these medicines: -acetaminophen -aspirin -ibuprofen -ketoprofen -naproxen This list may not describe all possible interactions. Give your health care provider a list of all the medicines, herbs, non-prescription drugs, or dietary supplements you use. Also tell them if you smoke, drink alcohol, or use illegal drugs. Some items may interact with your medicine. What should I watch for while using this medicine? Visit your doctor for checks on your progress. This drug may make you feel generally unwell. This is not uncommon, as chemotherapy can affect healthy cells as well as cancer cells. Report any side effects. Continue your course of treatment even though you feel ill unless your doctor tells you to stop. In some cases, you may be given additional medicines to help with side effects. Follow all directions for their use. Call your doctor or health care professional for advice if you get a fever, chills or sore throat, or other symptoms of  a cold or flu. Do not treat yourself. This drug decreases your body's ability to fight infections. Try to avoid being around people who are sick. This medicine may increase your risk to bruise  or bleed. Call your doctor or health care professional if you notice any unusual bleeding. Be careful brushing and flossing your teeth or using a toothpick because you may get an infection or bleed more easily. If you have any dental work done, tell your dentist you are receiving this medicine. Avoid taking products that contain aspirin, acetaminophen, ibuprofen, naproxen, or ketoprofen unless instructed by your doctor. These medicines may hide a fever. Women should inform their doctor if they wish to become pregnant or think they might be pregnant. There is a potential for serious side effects to an unborn child. Talk to your health care professional or pharmacist for more information. Do not breast-feed an infant while taking this medicine. What side effects may I notice from receiving this medicine? Side effects that you should report to your doctor or health care professional as soon as possible: -allergic reactions like skin rash, itching or hives, swelling of the face, lips, or tongue -low blood counts - this medicine may decrease the number of white blood cells, red blood cells and platelets. You may be at increased risk for infections and bleeding. -signs of infection - fever or chills, cough, sore throat, pain or difficulty passing urine -signs of decreased platelets or bleeding - bruising, pinpoint red spots on the skin, black, tarry stools, blood in the urine -signs of decreased red blood cells - unusually weak or tired, fainting spells, lightheadedness -breathing problems -chest pain -mouth sores -nausea and vomiting -pain, swelling, redness at site where injected -pain, tingling, numbness in the hands or feet -stomach pain -swelling of ankles, feet, hands -unusual bleeding Side effects that usually do not require medical attention (report to your doctor or health care professional if they continue or are bothersome): -constipation -diarrhea -hair loss -loss of appetite -stomach  upset This list may not describe all possible side effects. Call your doctor for medical advice about side effects. You may report side effects to FDA at 1-800-FDA-1088. Where should I keep my medicine? This drug is given in a hospital or clinic and will not be stored at home. NOTE: This sheet is a summary. It may not cover all possible information. If you have questions about this medicine, talk to your doctor, pharmacist, or health care provider.  2018 Elsevier/Gold Standard (2007-10-24 18:45:54)  Carboplatin injection What is this medicine? CARBOPLATIN (KAR boe pla tin) is a chemotherapy drug. It targets fast dividing cells, like cancer cells, and causes these cells to die. This medicine is used to treat ovarian cancer and many other cancers. This medicine may be used for other purposes; ask your health care provider or pharmacist if you have questions. COMMON BRAND NAME(S): Paraplatin What should I tell my health care provider before I take this medicine? They need to know if you have any of these conditions: -blood disorders -hearing problems -kidney disease -recent or ongoing radiation therapy -an unusual or allergic reaction to carboplatin, cisplatin, other chemotherapy, other medicines, foods, dyes, or preservatives -pregnant or trying to get pregnant -breast-feeding How should I use this medicine? This drug is usually given as an infusion into a vein. It is administered in a hospital or clinic by a specially trained health care professional. Talk to your pediatrician regarding the use of this medicine in children. Special care may be needed.  Overdosage: If you think you have taken too much of this medicine contact a poison control center or emergency room at once. NOTE: This medicine is only for you. Do not share this medicine with others. What if I miss a dose? It is important not to miss a dose. Call your doctor or health care professional if you are unable to keep an  appointment. What may interact with this medicine? -medicines for seizures -medicines to increase blood counts like filgrastim, pegfilgrastim, sargramostim -some antibiotics like amikacin, gentamicin, neomycin, streptomycin, tobramycin -vaccines Talk to your doctor or health care professional before taking any of these medicines: -acetaminophen -aspirin -ibuprofen -ketoprofen -naproxen This list may not describe all possible interactions. Give your health care provider a list of all the medicines, herbs, non-prescription drugs, or dietary supplements you use. Also tell them if you smoke, drink alcohol, or use illegal drugs. Some items may interact with your medicine. What should I watch for while using this medicine? Your condition will be monitored carefully while you are receiving this medicine. You will need important blood work done while you are taking this medicine. This drug may make you feel generally unwell. This is not uncommon, as chemotherapy can affect healthy cells as well as cancer cells. Report any side effects. Continue your course of treatment even though you feel ill unless your doctor tells you to stop. In some cases, you may be given additional medicines to help with side effects. Follow all directions for their use. Call your doctor or health care professional for advice if you get a fever, chills or sore throat, or other symptoms of a cold or flu. Do not treat yourself. This drug decreases your body's ability to fight infections. Try to avoid being around people who are sick. This medicine may increase your risk to bruise or bleed. Call your doctor or health care professional if you notice any unusual bleeding. Be careful brushing and flossing your teeth or using a toothpick because you may get an infection or bleed more easily. If you have any dental work done, tell your dentist you are receiving this medicine. Avoid taking products that contain aspirin, acetaminophen,  ibuprofen, naproxen, or ketoprofen unless instructed by your doctor. These medicines may hide a fever. Do not become pregnant while taking this medicine. Women should inform their doctor if they wish to become pregnant or think they might be pregnant. There is a potential for serious side effects to an unborn child. Talk to your health care professional or pharmacist for more information. Do not breast-feed an infant while taking this medicine. What side effects may I notice from receiving this medicine? Side effects that you should report to your doctor or health care professional as soon as possible: -allergic reactions like skin rash, itching or hives, swelling of the face, lips, or tongue -signs of infection - fever or chills, cough, sore throat, pain or difficulty passing urine -signs of decreased platelets or bleeding - bruising, pinpoint red spots on the skin, black, tarry stools, nosebleeds -signs of decreased red blood cells - unusually weak or tired, fainting spells, lightheadedness -breathing problems -changes in hearing -changes in vision -chest pain -high blood pressure -low blood counts - This drug may decrease the number of white blood cells, red blood cells and platelets. You may be at increased risk for infections and bleeding. -nausea and vomiting -pain, swelling, redness or irritation at the injection site -pain, tingling, numbness in the hands or feet -problems with balance, talking, walking -trouble   passing urine or change in the amount of urine Side effects that usually do not require medical attention (report to your doctor or health care professional if they continue or are bothersome): -hair loss -loss of appetite -metallic taste in the mouth or changes in taste This list may not describe all possible side effects. Call your doctor for medical advice about side effects. You may report side effects to FDA at 1-800-FDA-1088. Where should I keep my medicine? This drug  is given in a hospital or clinic and will not be stored at home. NOTE: This sheet is a summary. It may not cover all possible information. If you have questions about this medicine, talk to your doctor, pharmacist, or health care provider.  2018 Elsevier/Gold Standard (2007-09-19 14:38:05)

## 2017-04-20 ENCOUNTER — Ambulatory Visit (HOSPITAL_BASED_OUTPATIENT_CLINIC_OR_DEPARTMENT_OTHER): Payer: Medicare Other | Admitting: Oncology

## 2017-04-20 ENCOUNTER — Ambulatory Visit: Payer: Medicare Other

## 2017-04-20 ENCOUNTER — Ambulatory Visit (HOSPITAL_BASED_OUTPATIENT_CLINIC_OR_DEPARTMENT_OTHER): Payer: Medicare Other

## 2017-04-20 ENCOUNTER — Other Ambulatory Visit (HOSPITAL_BASED_OUTPATIENT_CLINIC_OR_DEPARTMENT_OTHER): Payer: Medicare Other

## 2017-04-20 ENCOUNTER — Telehealth: Payer: Self-pay

## 2017-04-20 VITALS — BP 88/58 | HR 81 | Temp 97.9°F | Resp 18 | Ht 70.0 in | Wt 163.0 lb

## 2017-04-20 VITALS — BP 105/53

## 2017-04-20 DIAGNOSIS — C642 Malignant neoplasm of left kidney, except renal pelvis: Secondary | ICD-10-CM | POA: Diagnosis not present

## 2017-04-20 DIAGNOSIS — Z5111 Encounter for antineoplastic chemotherapy: Secondary | ICD-10-CM

## 2017-04-20 DIAGNOSIS — I959 Hypotension, unspecified: Secondary | ICD-10-CM

## 2017-04-20 DIAGNOSIS — C689 Malignant neoplasm of urinary organ, unspecified: Secondary | ICD-10-CM

## 2017-04-20 DIAGNOSIS — Z95828 Presence of other vascular implants and grafts: Secondary | ICD-10-CM

## 2017-04-20 DIAGNOSIS — Z23 Encounter for immunization: Secondary | ICD-10-CM | POA: Diagnosis not present

## 2017-04-20 DIAGNOSIS — R5383 Other fatigue: Secondary | ICD-10-CM | POA: Diagnosis not present

## 2017-04-20 DIAGNOSIS — C7800 Secondary malignant neoplasm of unspecified lung: Secondary | ICD-10-CM

## 2017-04-20 DIAGNOSIS — R109 Unspecified abdominal pain: Secondary | ICD-10-CM | POA: Diagnosis not present

## 2017-04-20 DIAGNOSIS — D649 Anemia, unspecified: Secondary | ICD-10-CM

## 2017-04-20 DIAGNOSIS — E039 Hypothyroidism, unspecified: Secondary | ICD-10-CM

## 2017-04-20 LAB — CBC WITH DIFFERENTIAL/PLATELET
BASO%: 0.7 % (ref 0.0–2.0)
BASOS ABS: 0 10*3/uL (ref 0.0–0.1)
EOS ABS: 0 10*3/uL (ref 0.0–0.5)
EOS%: 0.4 % (ref 0.0–7.0)
HEMATOCRIT: 28.5 % — AB (ref 38.4–49.9)
HEMOGLOBIN: 9.1 g/dL — AB (ref 13.0–17.1)
LYMPH#: 0.6 10*3/uL — AB (ref 0.9–3.3)
LYMPH%: 12.4 % — ABNORMAL LOW (ref 14.0–49.0)
MCH: 30.3 pg (ref 27.2–33.4)
MCHC: 31.9 g/dL — ABNORMAL LOW (ref 32.0–36.0)
MCV: 95 fL (ref 79.3–98.0)
MONO#: 0.1 10*3/uL (ref 0.1–0.9)
MONO%: 1.3 % (ref 0.0–14.0)
NEUT#: 3.9 10*3/uL (ref 1.5–6.5)
NEUT%: 85.2 % — AB (ref 39.0–75.0)
PLATELETS: 81 10*3/uL — AB (ref 140–400)
RBC: 3 10*6/uL — ABNORMAL LOW (ref 4.20–5.82)
RDW: 15.6 % — ABNORMAL HIGH (ref 11.0–14.6)
WBC: 4.5 10*3/uL (ref 4.0–10.3)
nRBC: 0 % (ref 0–0)

## 2017-04-20 LAB — COMPREHENSIVE METABOLIC PANEL
ALT: 26 U/L (ref 0–55)
ANION GAP: 9 meq/L (ref 3–11)
AST: 28 U/L (ref 5–34)
Albumin: 2.7 g/dL — ABNORMAL LOW (ref 3.5–5.0)
Alkaline Phosphatase: 85 U/L (ref 40–150)
BUN: 24.5 mg/dL (ref 7.0–26.0)
CHLORIDE: 103 meq/L (ref 98–109)
CO2: 20 meq/L — AB (ref 22–29)
Calcium: 8.3 mg/dL — ABNORMAL LOW (ref 8.4–10.4)
Creatinine: 1.4 mg/dL — ABNORMAL HIGH (ref 0.7–1.3)
EGFR: 50 mL/min/{1.73_m2} — AB (ref >60)
Glucose: 196 mg/dl — ABNORMAL HIGH (ref 70–140)
Potassium: 4.2 mEq/L (ref 3.5–5.1)
SODIUM: 132 meq/L — AB (ref 136–145)
Total Bilirubin: 0.68 mg/dL (ref 0.20–1.20)
Total Protein: 6.3 g/dL — ABNORMAL LOW (ref 6.4–8.3)

## 2017-04-20 LAB — TECHNOLOGIST REVIEW

## 2017-04-20 MED ORDER — INFLUENZA VAC SPLIT QUAD 0.5 ML IM SUSY
0.5000 mL | PREFILLED_SYRINGE | Freq: Once | INTRAMUSCULAR | Status: DC
  Filled 2017-04-20: qty 0.5

## 2017-04-20 MED ORDER — SODIUM CHLORIDE 0.9% FLUSH
10.0000 mL | INTRAVENOUS | Status: DC | PRN
  Administered 2017-04-20: 10 mL
  Filled 2017-04-20: qty 10

## 2017-04-20 MED ORDER — SODIUM CHLORIDE 0.9 % IV SOLN
2000.0000 mg | Freq: Once | INTRAVENOUS | Status: AC
  Administered 2017-04-20: 2000 mg via INTRAVENOUS
  Filled 2017-04-20: qty 52.6

## 2017-04-20 MED ORDER — SODIUM CHLORIDE 0.9 % IV SOLN
Freq: Once | INTRAVENOUS | Status: AC
  Administered 2017-04-20: 09:00:00 via INTRAVENOUS

## 2017-04-20 MED ORDER — INFLUENZA VAC SPLIT HIGH-DOSE 0.5 ML IM SUSY
0.5000 mL | PREFILLED_SYRINGE | Freq: Once | INTRAMUSCULAR | Status: AC
  Administered 2017-04-20: 0.5 mL via INTRAMUSCULAR
  Filled 2017-04-20: qty 0.5

## 2017-04-20 MED ORDER — PROCHLORPERAZINE MALEATE 10 MG PO TABS
10.0000 mg | ORAL_TABLET | Freq: Once | ORAL | Status: AC
  Administered 2017-04-20: 10 mg via ORAL

## 2017-04-20 MED ORDER — PROCHLORPERAZINE MALEATE 10 MG PO TABS
ORAL_TABLET | ORAL | Status: AC
  Filled 2017-04-20: qty 1

## 2017-04-20 MED ORDER — HEPARIN SOD (PORK) LOCK FLUSH 100 UNIT/ML IV SOLN
500.0000 [IU] | Freq: Once | INTRAVENOUS | Status: AC | PRN
  Administered 2017-04-20: 500 [IU]
  Filled 2017-04-20: qty 5

## 2017-04-20 MED ORDER — SODIUM CHLORIDE 0.9% FLUSH
10.0000 mL | INTRAVENOUS | Status: AC | PRN
  Administered 2017-04-20: 10 mL via INTRAVENOUS
  Filled 2017-04-20: qty 10

## 2017-04-20 MED ORDER — MEGESTROL ACETATE 400 MG/10ML PO SUSP: 400.0000 mg | Freq: Two times a day (BID) | ORAL | 0 refills | Status: DC

## 2017-04-20 NOTE — Patient Instructions (Signed)
[facility] °Discharge Instructions for Patients Receiving Chemotherapy ° °Today you received the following chemotherapy agents Gemzar ° °To help prevent nausea and vomiting after your treatment, we encourage you to take your nausea medication as directed. °  °If you develop nausea and vomiting that is not controlled by your nausea medication, call the clinic.  ° °BELOW ARE SYMPTOMS THAT SHOULD BE REPORTED IMMEDIATELY: °· *FEVER GREATER THAN 100.5 F °· *CHILLS WITH OR WITHOUT FEVER °· NAUSEA AND VOMITING THAT IS NOT CONTROLLED WITH YOUR NAUSEA MEDICATION °· *UNUSUAL SHORTNESS OF BREATH °· *UNUSUAL BRUISING OR BLEEDING °· TENDERNESS IN MOUTH AND THROAT WITH OR WITHOUT PRESENCE OF ULCERS °· *URINARY PROBLEMS °· *BOWEL PROBLEMS °· UNUSUAL RASH °Items with * indicate a potential emergency and should be followed up as soon as possible. ° °Feel free to call the clinic should you have any questions or concerns. The clinic phone number is [phone]. ° °Please show the CHEMO ALERT CARD at check-in to the Emergency Department and triage nurse. ° ° °

## 2017-04-20 NOTE — Progress Notes (Signed)
P/ to Dr [name] and received order to treat with platelets of 81. Confirmed and repeated.

## 2017-04-20 NOTE — Telephone Encounter (Signed)
Printed avs and calender with patient request time for upcoming appointment. Per 10/24 los

## 2017-04-20 NOTE — Progress Notes (Signed)
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 Patient followed by Dr. [name].  Past medical history of HTN, HLD, afib, GERD, CABG  Met with patient and wife during infusion this am.  Patient reports for the last 3-4 weeks appetite has been poor.  Reports some mild nausea but not significant.  Also reports constipation.  Reports typically has cereal for breakfast, lunch yesterday was can of tuna and soup and dinner last night was small piece of grilled chicken with mashed potatoes.  Patient reports may eat less than this amount on some days.  Wife reports patient use to snack on sweets which he has not been doing recently.  Patient has not tried nutrition supplements.  Nutrition Focused Physical Exam: deferred  Medications: Vit D, colace, pepcid, fe sulfate, fibercon, compazine, megace prescribed today  Labs: Na 132, glucose 196  Anthropometrics:   Height: 70 inches Weight: 163 lb Noted 174 lb 8 oz on 9/27 BMI: 24  6% weight loss in the last month, significant   Estimated Energy Needs  Kcals: 2200-2500 calories/d Protein: 110-125 g/d Fluid: 2.5 L/d  NUTRITION DIAGNOSIS: Inadequate oral intake related to poor appetite as evidenced by weight loss of 6% in the last month, significant   MALNUTRITION DIAGNOSIS: Patient meets criteria for severe malnutrition in context of acute illness likely chronic with 6% weight loss in the last month, eating < 50% estimated energy needs for > or equal to 5 days.    INTERVENTION:   Discussed strategies to increase calories and protein. Discussed oral nutrition supplements and provided samples and coupons.  Discussed strategies to help with constipation.   Contact information provided.    MONITORING, EVALUATION, GOAL:  Patient will consume adequate calories and protein to meet nutritional needs and prevent further weight loss   NEXT VISIT: as needed during infusion  Edith Lord B. Zenia Resides, [place], [place] Registered Dietitian [phone] (pager)

## 2017-04-20 NOTE — Progress Notes (Signed)
Hematology and Oncology Follow Up Visit  [name] [name] [id number] 1946/03/15 71 y.o. 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 He presented with 4.9 cm mass of the upper pole of the left kidney with documented pulmonary metastasis. Neurological confirmation done on 04/15/2015 with a biopsy showed urothelial carcinoma.   Prior Therapy:   Status post biopsy of the left renal mass done on 04/15/2015. He is status post Port-A-Cath insertion on 05/21/2015. Systemic chemotherapy in the form of cisplatin and gemcitabine started on 05/27/2015. He is status post 6 cycles completed on 09/16/2015. CT scan on 01/06/2016 showed progression of disease. Pembrolizumab 200 mg every 3 weeks cycle 1 on 02/13/2016. Therapy concluded in October 2018 because of progression of disease.  Current therapy:  Carboplatin and gemcitabine chemotherapy started on 04/13/2017. Today is day 8 of cycle 1.  Interim History:  [name] [name] presents today for a follow-up visit. Since the last visit, he started carboplatin and gemcitabine chemotherapy and tolerated it well. He did report some mild nausea show she had a with tramadol and also takes Tylenol which has been effective in treating his pain. He denied any hematochezia or melena. He denied any hemoptysis. He denied any hematuria. He does report left sided flank pain which has been manageable at this time. His fatigue remains an issue although not dramatically different.   He does not report any headaches, blurry vision, syncope or seizures. He does not report any fevers, chills, or sweats. He does not report any chest pain, palpitation, orthopnea or leg edema. He does not report any wheezing or hemoptysis. He does not report any  vomiting, abdominal pain does report occasional dyspepsia. He does not report any frequency, urgency  or hesitancy. He does not report any skeletal complaints. Remaining review of systems unremarkable.   Medications: I have reviewed the patient's current medications.  Current Outpatient Prescriptions  Medication Sig Dispense Refill  . acetaminophen (TYLENOL) 325 MG tablet Take 650 mg by mouth every 6 (six) hours as needed for moderate pain or headache.     [name] atorvastatin (LIPITOR) 40 MG tablet Take 1 tablet (40 mg total) by mouth daily. (Patient taking differently: Take 40 mg by mouth every morning. ) 90 tablet 3  . benzonatate (TESSALON) 200 MG capsule TAKE ONE CAPSULE BY MOUTH 3 TIMES A DAY AS NEEDED FOR COUGH 30 capsule 0  . benzonatate (TESSALON) 200 MG capsule Take 1 capsule (200 mg total) by mouth 3 (three) times daily as needed for cough. 90 capsule 3  . Cholecalciferol (VITAMIN D) 1000 UNITS capsule Take 1,000 Units by mouth daily.      . diphenhydrAMINE (BENADRYL) 25 MG tablet Take 25 mg by mouth every 6 (six) hours as needed.    . docusate sodium (COLACE) 100 MG capsule Take 100 mg by mouth 2 (two) times daily as needed (constipation).    . famotidine (PEPCID) 20 MG tablet Take 1 tablet (20 mg total) by mouth daily. 30 tablet 2  . ferrous sulfate 325 (65 FE) MG EC tablet Take 1 tablet (325 mg total) by mouth 2 (two) times daily. 60 tablet 3  . levothyroxine (SYNTHROID, LEVOTHROID) 75 MCG tablet Take 1 tablet (75 mcg total) by mouth daily before breakfast. 90 tablet 3  . lidocaine-prilocaine (EMLA) cream Apply to port-a-cath 1-2 hours prior to acces. Cover with saran wrap. 30 g 1  . lisinopril (  PRINIVIL,ZESTRIL) 5 MG tablet     . megestrol (MEGACE) 400 MG/10ML suspension Take 10 mLs (400 mg total) by mouth 2 (two) times daily. 240 mL 0  . metoprolol tartrate (LOPRESSOR) 25 MG tablet Take 0.5 tablets (12.5 mg total) by mouth 2 (two) times daily. 90 tablet 3  . nitroGLYCERIN (NITROSTAT) 0.4 MG SL tablet Place 1 tablet (0.4 mg total) under the tongue every 5 (five) minutes as needed. May  repeat up to 3 doses. 100 tablet 3  . polycarbophil (FIBERCON) 625 MG tablet Take 625 mg by mouth 2 (two) times daily.     [name] PRESCRIPTION MEDICATION Inject 160 mg into the muscle daily. Gentamycin given for 3 days at [facility]    . prochlorperazine (COMPAZINE) 10 MG tablet Take 1 tablet (10 mg total) by mouth every 6 (six) hours as needed for nausea or vomiting. 30 tablet 0  . sildenafil (VIAGRA) 50 MG tablet Take 50 mg by mouth as needed for erectile dysfunction.     . traMADol (ULTRAM) 50 MG tablet Take 1 tablet (50 mg total) by mouth every 6 (six) hours as needed. 30 tablet 0   No current facility-administered medications for this visit.    Facility-Administered Medications Ordered in Other Visits  Medication Dose Route Frequency Provider Last Rate Last Dose  . sodium chloride flush (NS) 0.9 % injection 10 mL  10 mL Intravenous PRN Wyatt Portela, MD   10 mL at 04/20/17 4010     Allergies:  Allergies  Allergen Reactions  . Hydrocodone Other (See Comments)    Became too sedated    Past Medical History, Surgical history, Social history, and Family History were reviewed and updated.   Physical Exam: Blood pressure (!) 88/58, pulse 81, temperature 97.9 F (36.6 C), temperature source Oral, resp. rate 18, height 5\' 10"  (1.778 m), weight 163 lb (73.9 kg), SpO2 100 %. ECOG: 1 General appearance: Well-appearing gentleman appeared without distress. Head: Normocephalic, without obvious abnormality no oral ulcers or thrush. Neck: no adenopathy no thyroid masses. Lymph nodes: Cervical, supraclavicular, and axillary nodes normal. Heart:regular rate and rhythm, S1, S2 normal, no murmur, click, rub or gallop Lung:chest clear, no wheezing, rales, normal symmetric air entry Abdomin: soft, non-tender, without masses or organomegaly no rebound or guarding. EXT:no erythema, induration, or nodules Skin: No rashes or petechiae.  Lab Results: Lab Results  Component Value Date   WBC 4.5  04/20/2017   HGB 9.1 (L) 04/20/2017   HCT 28.5 (L) 04/20/2017   MCV 95.0 04/20/2017   PLT 81 (L) 04/20/2017     Chemistry      Component Value Date/Time   NA 132 (L) 04/20/2017 0820   K 4.2 04/20/2017 0820   CL 103 07/20/2016 0843   CO2 20 (L) 04/20/2017 0820   BUN 24.5 04/20/2017 0820   CREATININE 1.4 (H) 04/20/2017 0820      Component Value Date/Time   CALCIUM 8.3 (L) 04/20/2017 0820   ALKPHOS 85 04/20/2017 0820   AST 28 04/20/2017 0820   ALT 26 04/20/2017 0820   BILITOT 0.68 04/20/2017 2818        71 year old gentleman with the following issues:  1. 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 He did have a pathological confirmation done on 04/15/2015 with the specimen showed urothelial carcinoma.  He is status post  6 cycles of therapy completed in March 2017. CT scan on 04/11/2017showed continuous response to therapy. He had further decrease in his bilateral pulmonary nodules as well as decrease in the left upper pole renal lesion.   CT scan on 01/06/2016 showed mild progression of disease especially in his primary tumor. His pulmonary nodules have remained stable.   He is S/P Pembrolizumab between August 2017 and October 2018.  CT scan on 03/31/2017 showed clear progression of disease.  He is receiving salvage chemotherapy with carboplatin and gemcitabine. Today is day 8 of cycle 1 and will receive it without any dose reduction or delay. He does have mild thrombocytopenia without any bleeding symptoms.   2. Hypercalcemia: He received a Zometa on 03/24/2017 and his calcium was corrected.  3. IV access: Port-A-Cath remains in place without complications.  4. Hypotension: I asked him to stop lisinopril for the time being I will monitor his blood pressure closely.  5. Anemia: Hemoglobin appears adequate  without any transfusion.  6. Autoimmune surveillance: He did develop hypothyroidism and currently on 75 g of Synthroid. I will repeat his TSH and titrate higher dose. His endocrinology follow-up is pending.  7. Pulmonary toxicity surveillance: no worsening pulmonary toxicity noted.  8. Follow-up: Will be into this for the start of cycle 2 of chemotherapy.  Zola Button, MD 10/24/20189:17 AM

## 2017-05-02 ENCOUNTER — Other Ambulatory Visit: Payer: Self-pay | Admitting: Cardiology

## 2017-05-02 DIAGNOSIS — I4891 Unspecified atrial fibrillation: Secondary | ICD-10-CM

## 2017-05-02 DIAGNOSIS — I10 Essential (primary) hypertension: Secondary | ICD-10-CM

## 2017-05-04 ENCOUNTER — Ambulatory Visit: Payer: Medicare Other | Admitting: Oncology

## 2017-05-04 ENCOUNTER — Ambulatory Visit: Payer: Medicare Other

## 2017-05-04 ENCOUNTER — Telehealth: Payer: Self-pay | Admitting: *Deleted

## 2017-05-04 ENCOUNTER — Ambulatory Visit (HOSPITAL_BASED_OUTPATIENT_CLINIC_OR_DEPARTMENT_OTHER): Payer: Medicare Other | Admitting: Oncology

## 2017-05-04 ENCOUNTER — Other Ambulatory Visit (HOSPITAL_BASED_OUTPATIENT_CLINIC_OR_DEPARTMENT_OTHER): Payer: Medicare Other

## 2017-05-04 ENCOUNTER — Telehealth: Payer: Self-pay | Admitting: Oncology

## 2017-05-04 VITALS — BP 119/54 | HR 68 | Temp 97.7°F | Resp 18 | Ht 70.0 in | Wt 162.6 lb

## 2017-05-04 DIAGNOSIS — C642 Malignant neoplasm of left kidney, except renal pelvis: Secondary | ICD-10-CM | POA: Diagnosis not present

## 2017-05-04 DIAGNOSIS — C7800 Secondary malignant neoplasm of unspecified lung: Secondary | ICD-10-CM

## 2017-05-04 DIAGNOSIS — D649 Anemia, unspecified: Secondary | ICD-10-CM

## 2017-05-04 DIAGNOSIS — C689 Malignant neoplasm of urinary organ, unspecified: Secondary | ICD-10-CM

## 2017-05-04 DIAGNOSIS — Z95828 Presence of other vascular implants and grafts: Secondary | ICD-10-CM

## 2017-05-04 LAB — MANUAL DIFFERENTIAL
ALC: 0.7 10*3/uL — AB (ref 0.9–3.3)
ANC (CHCC MAN DIFF): 0.1 10*3/uL — AB (ref 1.5–6.5)
BASOPHIL: 0 % (ref 0–2)
BLASTS: 0 % (ref 0–0)
Band Neutrophils: 2 % (ref 0–10)
EOS%: 4 % (ref 0–7)
LYMPH: 51 % — AB (ref 14–49)
METAMYELOCYTES PCT: 0 % (ref 0–0)
MONO: 35 % — AB (ref 0–14)
Myelocytes: 1 % — ABNORMAL HIGH (ref 0–0)
NRBC: 1 % — AB (ref 0–0)
Other Cell: 0 % (ref 0–0)
PLT EST: DECREASED
PROMYELO: 0 % (ref 0–0)
SEG: 7 % — ABNORMAL LOW (ref 38–77)
VARIANT LYMPH: 0 % (ref 0–0)

## 2017-05-04 LAB — CBC WITH DIFFERENTIAL/PLATELET
HEMATOCRIT: 23.3 % — AB (ref 38.4–49.9)
HEMOGLOBIN: 7.2 g/dL — AB (ref 13.0–17.1)
MCH: 29.5 pg (ref 27.2–33.4)
MCHC: 30.9 g/dL — AB (ref 32.0–36.0)
MCV: 95.5 fL (ref 79.3–98.0)
Platelets: 134 10*3/uL — ABNORMAL LOW (ref 140–400)
RBC: 2.44 10*6/uL — ABNORMAL LOW (ref 4.20–5.82)
RDW: 14.8 % — AB (ref 11.0–14.6)
WBC: 1.4 10*3/uL — ABNORMAL LOW (ref 4.0–10.3)

## 2017-05-04 LAB — COMPREHENSIVE METABOLIC PANEL
ALT: 24 U/L (ref 0–55)
AST: 15 U/L (ref 5–34)
Albumin: 2.8 g/dL — ABNORMAL LOW (ref 3.5–5.0)
Alkaline Phosphatase: 99 U/L (ref 40–150)
Anion Gap: 7 mEq/L (ref 3–11)
BILIRUBIN TOTAL: 0.36 mg/dL (ref 0.20–1.20)
BUN: 16.4 mg/dL (ref 7.0–26.0)
CHLORIDE: 106 meq/L (ref 98–109)
CO2: 24 meq/L (ref 22–29)
CREATININE: 1.1 mg/dL (ref 0.7–1.3)
Calcium: 8.8 mg/dL (ref 8.4–10.4)
EGFR: >60 mL/min/{1.73_m2} (ref >60)
GLUCOSE: 133 mg/dL (ref 70–140)
Potassium: 3.6 mEq/L (ref 3.5–5.1)
SODIUM: 138 meq/L (ref 136–145)
TOTAL PROTEIN: 6.3 g/dL — AB (ref 6.4–8.3)

## 2017-05-04 MED ORDER — SODIUM CHLORIDE 0.9% FLUSH
10.0000 mL | INTRAVENOUS | Status: DC | PRN
Start: 2017-05-04 — End: 2017-05-04
  Administered 2017-05-04: 10 mL via INTRAVENOUS
  Filled 2017-05-04: qty 10

## 2017-05-04 NOTE — Telephone Encounter (Signed)
Faxed o.v. Note and labs to dr Buddy Duty, per patient's request. Gave neutropenic precautions and masks for patient to wear.

## 2017-05-04 NOTE — Telephone Encounter (Signed)
Gave avs and calendar for November and December  °

## 2017-05-04 NOTE — Progress Notes (Signed)
Hematology and Oncology Follow Up Visit  [name] [name] [id number] [birth year] 71 y.o. 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 He presented with 4.9 cm mass of the upper pole of the left kidney with documented pulmonary metastasis. Neurological confirmation done on 04/15/2015 with a biopsy showed urothelial carcinoma.   Prior Therapy:   Status post biopsy of the left renal mass done on 04/15/2015. He is status post Port-A-Cath insertion on 05/21/2015. Systemic chemotherapy in the form of cisplatin and gemcitabine started on 05/27/2015. He is status post 6 cycles completed on 09/16/2015. CT scan on 01/06/2016 showed progression of disease. Pembrolizumab 200 mg every 3 weeks cycle 1 on 02/13/2016. Therapy concluded in October 2018 because of progression of disease.  Current therapy:  Carboplatin and gemcitabine chemotherapy started on 04/13/2017.  He is here for the start of cycle 2 of therapy.  Interim History:  [name] [name] presents today for a follow-up visit. Since the last visit, he completed the first cycle of carboplatin and gemcitabine chemotherapy and tolerated it well.  He had significant improvement in his overall health after receiving chemotherapy.  His fatigue is actually improved and his flank pain has also improved.  He denies any nausea, vomiting, hematochezia or melena.  He denies any recurrent infections.  He continues to attend activities of daily living.  He denies any hematuria or dysuria.   He does not report any headaches, blurry vision, syncope or seizures. He does not report any fevers, chills, or sweats. He does not report any chest pain, palpitation, orthopnea or leg edema. He does not report any wheezing or hemoptysis. He does not report any  vomiting, abdominal pain does report occasional dyspepsia. He does not report any  frequency, urgency or hesitancy. He does not report any skeletal complaints. Remaining review of systems unremarkable.   Medications: I have reviewed the patient's current medications.  Current Outpatient Medications  Medication Sig Dispense Refill  . acetaminophen (TYLENOL) 325 MG tablet Take 650 mg by mouth every 6 (six) hours as needed for moderate pain or headache.     [name] atorvastatin (LIPITOR) 40 MG tablet Take 1 tablet (40 mg total) by mouth daily. (Patient taking differently: Take 40 mg by mouth every morning. ) 90 tablet 3  . benzonatate (TESSALON) 200 MG capsule TAKE ONE CAPSULE BY MOUTH 3 TIMES A DAY AS NEEDED FOR COUGH 30 capsule 0  . benzonatate (TESSALON) 200 MG capsule Take 1 capsule (200 mg total) by mouth 3 (three) times daily as needed for cough. 90 capsule 3  . Cholecalciferol (VITAMIN D) 1000 UNITS capsule Take 1,000 Units by mouth daily.      . diphenhydrAMINE (BENADRYL) 25 MG tablet Take 25 mg by mouth every 6 (six) hours as needed.    . docusate sodium (COLACE) 100 MG capsule Take 100 mg by mouth 2 (two) times daily as needed (constipation).    . famotidine (PEPCID) 20 MG tablet Take 1 tablet (20 mg total) by mouth daily. 30 tablet 2  . ferrous sulfate 325 (65 FE) MG EC tablet Take 1 tablet (325 mg total) by mouth 2 (two) times daily. 60 tablet 3  . levothyroxine (SYNTHROID, LEVOTHROID) 75 MCG tablet Take 1 tablet (75 mcg total) by mouth daily before breakfast. 90 tablet 3  . lidocaine-prilocaine (EMLA) cream Apply to port-a-cath 1-2 hours prior to acces. Cover with saran wrap. 30 g  1  . lisinopril (PRINIVIL,ZESTRIL) 5 MG tablet     . megestrol (MEGACE) 400 MG/10ML suspension Take 10 mLs (400 mg total) by mouth 2 (two) times daily. 240 mL 0  . metoprolol tartrate (LOPRESSOR) 25 MG tablet Take 0.5 tablets (12.5 mg total) by mouth 2 (two) times daily. 90 tablet 3  . nitroGLYCERIN (NITROSTAT) 0.4 MG SL tablet Place 1 tablet (0.4 mg total) under the tongue every 5 (five) minutes  as needed. May repeat up to 3 doses. 100 tablet 3  . polycarbophil (FIBERCON) 625 MG tablet Take 625 mg by mouth 2 (two) times daily.     [name] PRESCRIPTION MEDICATION Inject 160 mg into the muscle daily. Gentamycin given for 3 days at [facility]    . prochlorperazine (COMPAZINE) 10 MG tablet Take 1 tablet (10 mg total) by mouth every 6 (six) hours as needed for nausea or vomiting. 30 tablet 0  . sildenafil (VIAGRA) 50 MG tablet Take 50 mg by mouth as needed for erectile dysfunction.     . traMADol (ULTRAM) 50 MG tablet Take 1 tablet (50 mg total) by mouth every 6 (six) hours as needed. 30 tablet 0   No current facility-administered medications for this visit.    Facility-Administered Medications Ordered in Other Visits  Medication Dose Route Frequency Provider Last Rate Last Dose  . sodium chloride flush (NS) 0.9 % injection 10 mL  10 mL Intravenous PRN Wyatt Portela, MD   10 mL at 04/20/17 0762     Allergies:  Allergies  Allergen Reactions  . Hydrocodone Other (See Comments)    Became too sedated    Past Medical History, Surgical history, Social history, and Family History were reviewed and updated.   Physical Exam: Blood pressure (!) 119/54, pulse 68, temperature 97.7 F (36.5 C), temperature source Oral, resp. rate 18, height 5\' 10"  (1.778 m), weight 162 lb 9.6 oz (73.8 kg), SpO2 100 %. ECOG: 1 General appearance: Alert with out distress.  Head: Normocephalic, without obvious abnormality no thrush Neck: no adenopathy no thyroid masses. Lymph nodes: Cervical, supraclavicular, and axillary nodes normal. Heart:regular rate and rhythm, S1, S2 normal, no murmur, click, rub or gallop Lung:chest clear, no wheezing, rales, normal symmetric air entry Abdomin: soft, non-tender, without masses or organomegaly no shifting dullness or ascitics.  EXT:no erythema, induration, or nodules Skin: No rashes or petechiae.  Lab Results: Lab Results  Component Value Date   WBC 4.5  04/20/2017   HGB 9.1 (L) 04/20/2017   HCT 28.5 (L) 04/20/2017   MCV 95.0 04/20/2017   PLT 81 (L) 04/20/2017     Chemistry      Component Value Date/Time   NA 132 (L) 04/20/2017 0820   K 4.2 04/20/2017 0820   CL 103 07/20/2016 0843   CO2 20 (L) 04/20/2017 0820   BUN 24.5 04/20/2017 0820   CREATININE 1.4 (H) 04/20/2017 0820      Component Value Date/Time   CALCIUM 8.3 (L) 04/20/2017 0820   ALKPHOS 85 04/20/2017 0820   AST 28 04/20/2017 0820   ALT 26 04/20/2017 0820   BILITOT 0.68 04/20/2017 158        71 year old gentleman with the following issues:  1. 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 He did have a pathological confirmation done on 04/15/2015 with the specimen showed urothelial carcinoma.  He is status post 6 cycles of therapy completed in March 2017. CT scan on 04/11/2017showed continuous response to therapy. He had further decrease in his bilateral pulmonary nodules as well as decrease in the left upper pole renal lesion.   CT scan on 01/06/2016 showed mild progression of disease especially in his primary tumor. His pulmonary nodules have remained stable.   He is S/P Pembrolizumab between August 2017 and October 2018.  CT scan on 03/31/2017 showed clear progression of disease.  He is receiving salvage chemotherapy with carboplatin and gemcitabine.   22 He is S/P cycle one with significant clinical improvement. Cycle 2 will be delayed by one week and dose will be reduced.     2. Hypercalcemia: He received a Zometa on 03/24/2017 and his calcium was corrected.  3. IV access: Port-A-Cath remains in place without complications.  4. Hypotension: BP is back to normal at this time. I asked him to continue to hold BP meds.   5. Anemia: Hemoglobin appears adequate without any transfusion.  6.  Autoimmune surveillance: He did develop hypothyroidism and currently on 75 g of Synthroid. He has follow up with endocrinology.   7. Follow-up: Will be in one week for the restart of cycle 2  Zola Button, MD 11/7/201810:54 AM

## 2017-05-05 ENCOUNTER — Telehealth: Payer: Self-pay | Admitting: *Deleted

## 2017-05-05 DIAGNOSIS — D649 Anemia, unspecified: Secondary | ICD-10-CM | POA: Diagnosis not present

## 2017-05-05 DIAGNOSIS — E039 Hypothyroidism, unspecified: Secondary | ICD-10-CM | POA: Diagnosis not present

## 2017-05-05 NOTE — Telephone Encounter (Signed)
Wife confirming appts

## 2017-05-06 ENCOUNTER — Encounter: Payer: Self-pay | Admitting: *Deleted

## 2017-05-06 NOTE — Addendum Note (Signed)
Addended by: Randolm Idol on: 05/06/2017 08:23 AM   Modules accepted: Orders

## 2017-05-11 ENCOUNTER — Ambulatory Visit (HOSPITAL_BASED_OUTPATIENT_CLINIC_OR_DEPARTMENT_OTHER): Payer: Medicare Other

## 2017-05-11 ENCOUNTER — Other Ambulatory Visit (HOSPITAL_BASED_OUTPATIENT_CLINIC_OR_DEPARTMENT_OTHER): Payer: Medicare Other

## 2017-05-11 ENCOUNTER — Ambulatory Visit: Payer: Medicare Other

## 2017-05-11 VITALS — BP 123/62 | HR 71 | Temp 97.7°F | Resp 18

## 2017-05-11 DIAGNOSIS — C7800 Secondary malignant neoplasm of unspecified lung: Secondary | ICD-10-CM

## 2017-05-11 DIAGNOSIS — C689 Malignant neoplasm of urinary organ, unspecified: Secondary | ICD-10-CM

## 2017-05-11 DIAGNOSIS — C642 Malignant neoplasm of left kidney, except renal pelvis: Secondary | ICD-10-CM

## 2017-05-11 DIAGNOSIS — Z5111 Encounter for antineoplastic chemotherapy: Secondary | ICD-10-CM

## 2017-05-11 DIAGNOSIS — Z95828 Presence of other vascular implants and grafts: Secondary | ICD-10-CM

## 2017-05-11 LAB — CBC WITH DIFFERENTIAL/PLATELET
BASO%: 1.1 % (ref 0.0–2.0)
BASOS ABS: 0.1 10*3/uL (ref 0.0–0.1)
EOS%: 0.9 % (ref 0.0–7.0)
Eosinophils Absolute: 0.1 10*3/uL (ref 0.0–0.5)
HEMATOCRIT: 24.3 % — AB (ref 38.4–49.9)
HGB: 7.9 g/dL — ABNORMAL LOW (ref 13.0–17.1)
LYMPH#: 0.7 10*3/uL — AB (ref 0.9–3.3)
LYMPH%: 11 % — ABNORMAL LOW (ref 14.0–49.0)
MCH: 31.1 pg (ref 27.2–33.4)
MCHC: 32.7 g/dL (ref 32.0–36.0)
MCV: 95.4 fL (ref 79.3–98.0)
MONO#: 1.3 10*3/uL — AB (ref 0.1–0.9)
MONO%: 20.4 % — ABNORMAL HIGH (ref 0.0–14.0)
NEUT#: 4.2 10*3/uL (ref 1.5–6.5)
NEUT%: 66.6 % (ref 39.0–75.0)
PLATELETS: 319 10*3/uL (ref 140–400)
RBC: 2.54 10*6/uL — ABNORMAL LOW (ref 4.20–5.82)
RDW: 18.2 % — ABNORMAL HIGH (ref 11.0–14.6)
WBC: 6.3 10*3/uL (ref 4.0–10.3)

## 2017-05-11 LAB — COMPREHENSIVE METABOLIC PANEL
ALT: 10 U/L (ref 0–55)
AST: 14 U/L (ref 5–34)
Albumin: 2.8 g/dL — ABNORMAL LOW (ref 3.5–5.0)
Alkaline Phosphatase: 98 U/L (ref 40–150)
Anion Gap: 8 mEq/L (ref 3–11)
BUN: 16.1 mg/dL (ref 7.0–26.0)
CO2: 26 meq/L (ref 22–29)
Calcium: 8.8 mg/dL (ref 8.4–10.4)
Chloride: 106 mEq/L (ref 98–109)
Creatinine: 1.2 mg/dL (ref 0.7–1.3)
GLUCOSE: 152 mg/dL — AB (ref 70–140)
POTASSIUM: 3.3 meq/L — AB (ref 3.5–5.1)
SODIUM: 140 meq/L (ref 136–145)
TOTAL PROTEIN: 6.4 g/dL (ref 6.4–8.3)
Total Bilirubin: 0.36 mg/dL (ref 0.20–1.20)

## 2017-05-11 MED ORDER — SODIUM CHLORIDE 0.9 % IV SOLN
Freq: Once | INTRAVENOUS | Status: AC
  Administered 2017-05-11: 10:00:00 via INTRAVENOUS

## 2017-05-11 MED ORDER — HEPARIN SOD (PORK) LOCK FLUSH 100 UNIT/ML IV SOLN
500.0000 [IU] | Freq: Once | INTRAVENOUS | Status: AC | PRN
Start: 2017-05-11 — End: 2017-05-11
  Administered 2017-05-11: 500 [IU]
  Filled 2017-05-11: qty 5

## 2017-05-11 MED ORDER — DEXAMETHASONE SODIUM PHOSPHATE 10 MG/ML IJ SOLN
INTRAMUSCULAR | Status: AC
  Filled 2017-05-11: qty 1

## 2017-05-11 MED ORDER — SODIUM CHLORIDE 0.9% FLUSH
10.0000 mL | INTRAVENOUS | Status: DC | PRN
  Administered 2017-05-11: 10 mL
  Filled 2017-05-11: qty 10

## 2017-05-11 MED ORDER — PALONOSETRON HCL INJECTION 0.25 MG/5ML
INTRAVENOUS | Status: AC
  Filled 2017-05-11: qty 5

## 2017-05-11 MED ORDER — GEMCITABINE HCL CHEMO INJECTION 1 GM/26.3ML
800.0000 mg/m2 | Freq: Once | INTRAVENOUS | Status: AC
  Administered 2017-05-11: 1558 mg via INTRAVENOUS
  Filled 2017-05-11: qty 40.98

## 2017-05-11 MED ORDER — DEXAMETHASONE SODIUM PHOSPHATE 10 MG/ML IJ SOLN
10.0000 mg | Freq: Once | INTRAMUSCULAR | Status: AC
  Administered 2017-05-11: 10 mg via INTRAVENOUS

## 2017-05-11 MED ORDER — SODIUM CHLORIDE 0.9% FLUSH
10.0000 mL | INTRAVENOUS | Status: DC | PRN
  Administered 2017-05-11: 10 mL via INTRAVENOUS
  Filled 2017-05-11: qty 10

## 2017-05-11 MED ORDER — PALONOSETRON HCL INJECTION 0.25 MG/5ML
0.2500 mg | Freq: Once | INTRAVENOUS | Status: AC
  Administered 2017-05-11: 0.25 mg via INTRAVENOUS

## 2017-05-11 MED ORDER — SODIUM CHLORIDE 0.9 % IV SOLN
353.5000 mg | Freq: Once | INTRAVENOUS | Status: AC
  Administered 2017-05-11: 350 mg via INTRAVENOUS
  Filled 2017-05-11: qty 35

## 2017-05-11 NOTE — Progress Notes (Signed)
Pt with Hemoglobin of 7.9 okay to treat per Erline Levine RN per Dr. [name].

## 2017-05-11 NOTE — Patient Instructions (Signed)
[facility] Discharge Instructions for Patients Receiving Chemotherapy  Today you received the following chemotherapy agents: Gemzar/Carboplatin.  To help prevent nausea and vomiting after your treatment, we encourage you to take your nausea medication as directed.   If you develop nausea and vomiting that is not controlled by your nausea medication, call the clinic.   BELOW ARE SYMPTOMS THAT SHOULD BE REPORTED IMMEDIATELY:  *FEVER GREATER THAN 100.5 F  *CHILLS WITH OR WITHOUT FEVER  NAUSEA AND VOMITING THAT IS NOT CONTROLLED WITH YOUR NAUSEA MEDICATION  *UNUSUAL SHORTNESS OF BREATH  *UNUSUAL BRUISING OR BLEEDING  TENDERNESS IN MOUTH AND THROAT WITH OR WITHOUT PRESENCE OF ULCERS  *URINARY PROBLEMS  *BOWEL PROBLEMS  UNUSUAL RASH Items with * indicate a potential emergency and should be followed up as soon as possible.  Feel free to call the clinic should you have any questions or concerns. The clinic phone number is [phone].  Please show the CHEMO ALERT CARD at check-in to the Emergency Department and triage nurse.   

## 2017-05-13 ENCOUNTER — Encounter: Payer: Self-pay | Admitting: Internal Medicine

## 2017-05-13 ENCOUNTER — Ambulatory Visit: Payer: Medicare Other | Admitting: Internal Medicine

## 2017-05-13 VITALS — BP 114/66 | HR 67 | Ht 70.0 in | Wt 167.8 lb

## 2017-05-13 DIAGNOSIS — R0602 Shortness of breath: Secondary | ICD-10-CM | POA: Diagnosis not present

## 2017-05-13 DIAGNOSIS — I10 Essential (primary) hypertension: Secondary | ICD-10-CM | POA: Diagnosis not present

## 2017-05-13 DIAGNOSIS — I4891 Unspecified atrial fibrillation: Secondary | ICD-10-CM

## 2017-05-13 DIAGNOSIS — E785 Hyperlipidemia, unspecified: Secondary | ICD-10-CM | POA: Diagnosis not present

## 2017-05-13 DIAGNOSIS — I25810 Atherosclerosis of coronary artery bypass graft(s) without angina pectoris: Secondary | ICD-10-CM | POA: Diagnosis not present

## 2017-05-13 DIAGNOSIS — I2510 Atherosclerotic heart disease of native coronary artery without angina pectoris: Secondary | ICD-10-CM

## 2017-05-13 LAB — LIPID PANEL
Chol/HDL Ratio: 2.7 ratio (ref 0.0–5.0)
Cholesterol, Total: 111 mg/dL (ref 100–199)
HDL: 41 mg/dL (ref >39)
LDL Calculated: 46 mg/dL (ref 0–99)
TRIGLYCERIDES: 121 mg/dL (ref 0–149)
VLDL CHOLESTEROL CAL: 24 mg/dL (ref 5–40)

## 2017-05-13 MED ORDER — NITROGLYCERIN 0.4 MG SL SUBL: 0.4000 mg | SUBLINGUAL_TABLET | SUBLINGUAL | 3 refills | Status: AC | PRN

## 2017-05-13 MED ORDER — ATORVASTATIN CALCIUM 40 MG PO TABS: 40.0000 mg | ORAL_TABLET | Freq: Every day | ORAL | 3 refills | Status: AC

## 2017-05-13 MED ORDER — METOPROLOL TARTRATE 25 MG PO TABS: 12.5000 mg | ORAL_TABLET | Freq: Two times a day (BID) | ORAL | 3 refills | Status: DC

## 2017-05-13 NOTE — Patient Instructions (Addendum)
Medication Instructions:  You do not need to restart lisinopril  Labwork: Your physician recommends that you have a FASTING lipid profile today.    Testing/Procedures: None   Follow-Up: Your physician wants you to follow-up in: 1 year with Dr End. (November 2019). You will receive a reminder letter in the mail two months in advance. If you don't receive a letter, please call our office to schedule the follow-up appointment.        If you need a refill on your cardiac medications before your next appointment, please call your pharmacy.

## 2017-05-13 NOTE — Progress Notes (Signed)
Follow-up Outpatient Visit Date: 05/13/2017  Primary Care Provider: Tonia Ghent, MD [place] [id number]  Chief Complaint: Follow-up coronary artery disease  HPI:  [name] [name] is a 71 y.o. year-old male with history of coronary artery disease status post (CABG 2011), postoperative atrial fibrillation, hypertension, renal transitional cell carcinoma with pulmonary metastases (2007), and COPD, who presents for follow-up of coronary artery disease. He was previously followed in our office by Dr. Aundra Dubin, having last been seen in 04/2016.He was doing well at that time with the exception of very mild exertional dyspnea. He was on maintenance therapy with pembrolizumab for his transitional cell carcinoma, though due to progression of disease, he has started on carboplatin/gemcitabine last month.  Today, Mr. Legate reports not having any new heart problems. He denies chest pain, palpitations, lightheadedness, and edema. He has chronic shortness of breath since restarting chemotherapy in the setting of significant anemia. He notes occasional hematuria. Lisinopril was discontinued a few weeks ago by his oncologist due to low blood pressures clinic. Since that time, Mr. Tashiro has been checking his blood pressure at home and notes that it has been normal. He has not needed to use any nitroglycerin.  --------------------------------------------------------------------------------------------------  Past Medical History: 1. COPD: quit smoking 10/11.  2. ERECTILE DYSFUNCTION 3. HYPERTENSION  4. GERD  5. CAD: Exertional chest pain prompted LHC (10/11) showing EF 55%, mild inferior hypokinesis, 90% prox RCA, 70% mid RCA, 80% distal RCA, 70% ostial PDA, 70% mPLV, 90% mid OM1 (large), 90-95% prox LAD.  Patient had CABG with LIMA-LAD, SVG-OM1, seq SVG-PDA/PLV.  6. Atrial fibrillation: Brief, post-op CABG.  7. Left upper pole kidney transitional cell CA with pulmonary mets  (2017 diagnosis): Cisplatin/gemcitabine chemotherapy initially, followed by pembrolizumab. He is back on carboplatin/gemcitabine due to progression of disease (restarted 04/13/17)  Current Meds  Medication Sig  . acetaminophen (TYLENOL) 325 MG tablet Take 650 mg by mouth every 6 (six) hours as needed for moderate pain or headache.   [name] atorvastatin (LIPITOR) 40 MG tablet Take 1 tablet (40 mg total) daily by mouth. Please keep upcoming appt for future refills. Thank you  . benzonatate (TESSALON) 200 MG capsule TAKE ONE CAPSULE BY MOUTH 3 TIMES A DAY AS NEEDED FOR COUGH  . benzonatate (TESSALON) 200 MG capsule Take 1 capsule (200 mg total) by mouth 3 (three) times daily as needed for cough.  . Cholecalciferol (VITAMIN D) 1000 UNITS capsule Take 1,000 Units by mouth daily.    . diphenhydrAMINE (BENADRYL) 25 MG tablet Take 25 mg by mouth every 6 (six) hours as needed.  . docusate sodium (COLACE) 100 MG capsule Take 100 mg by mouth 2 (two) times daily as needed (constipation).  . famotidine (PEPCID) 20 MG tablet Take 1 tablet (20 mg total) by mouth daily.  [name] levothyroxine (SYNTHROID, LEVOTHROID) 100 MCG tablet Take 100 mcg daily before breakfast by mouth.  . lidocaine-prilocaine (EMLA) cream Apply to port-a-cath 1-2 hours prior to acces. Cover with saran wrap.  . lisinopril (PRINIVIL,ZESTRIL) 5 MG tablet   . metoprolol tartrate (LOPRESSOR) 25 MG tablet Take 0.5 tablets (12.5 mg total) by mouth 2 (two) times daily.  . nitroGLYCERIN (NITROSTAT) 0.4 MG SL tablet Place 1 tablet (0.4 mg total) under the tongue every 5 (five) minutes as needed. May repeat up to 3 doses.  . polycarbophil (FIBERCON) 625 MG tablet Take 625 mg by mouth 2 (two) times daily.   [name] PRESCRIPTION MEDICATION Inject 160 mg into the muscle daily.  Gentamycin given for 3 days at [facility]  . prochlorperazine (COMPAZINE) 10 MG tablet Take 1 tablet (10 mg total) by mouth every 6 (six) hours as needed for nausea or vomiting.  .  sildenafil (VIAGRA) 50 MG tablet Take 50 mg by mouth as needed for erectile dysfunction.   . traMADol (ULTRAM) 50 MG tablet Take 1 tablet (50 mg total) by mouth every 6 (six) hours as needed.  . [DISCONTINUED] ferrous sulfate 325 (65 FE) MG EC tablet Take 1 tablet (325 mg total) by mouth 2 (two) times daily.  . [DISCONTINUED] megestrol (MEGACE) 400 MG/10ML suspension Take 10 mLs (400 mg total) by mouth 2 (two) times daily.    Allergies: Hydrocodone  Social History   Socioeconomic History  . Marital status: Married    Spouse name: Not on file  . Number of children: Not on file  . Years of education: Not on file  . Highest education level: Not on file  Social Needs  . Financial resource strain: Not on file  . Food insecurity - worry: Not on file  . Food insecurity - inability: Not on file  . Transportation needs - medical: Not on file  . Transportation needs - non-medical: Not on file  Occupational History  . Occupation: Clinical research associate: [employer]    Comment: Working 3rd shift  Tobacco Use  . Smoking status: Former Smoker    Last attempt to quit: 07/07/2009    Years since quitting: 7.8  . Smokeless tobacco: Former Network engineer and Sexual Activity  . Alcohol use: No  . Drug use: No  . Sexual activity: Not on file  Other Topics Concern  . Not on file  Social History Narrative   Married   3 healthy children   Regular exercise   Caffeinated beverages   Dentures      Designated Party Form signed on 01/06/2010 appointing Ward Chatters. May leave message on cell phone.    Family History  Problem Relation Age of Onset  . Arthritis Mother   . Hypertension Mother   . Sudden death Mother 62  . Arthritis Father   . Hypertension Father   . Lung cancer Father   . Stroke Maternal Uncle   . Heart attack Neg Hx     Review of Systems: A 12-system review of systems was performed and was negative except as noted in the  HPI.  --------------------------------------------------------------------------------------------------  Physical Exam: BP 114/66   Pulse 67   Ht 5\' 10"  (1.778 m)   Wt 167 lb 12.8 oz (76.1 kg)   SpO2 96%   BMI 24.08 kg/m   General:  Well-developed, well-nourished man, seated comfortably in the exam room. HEENT: Conjunctival pallor noted without scleral icterus. Moist mucous membranes.  OP clear. Neck: Supple without lymphadenopathy, thyromegaly, JVD, or HJR. Lungs: Normal work of breathing. Clear to auscultation bilaterally without wheezes or crackles. Heart: Regular rate and rhythm without murmurs, rubs, or gallops. Non-displaced PMI. Abd: Bowel sounds present. Soft, NT/ND without hepatosplenomegaly Ext: No lower extremity edema. Radial, PT, and DP pulses are 2+ bilaterally. Skin: Warm and dry without rash. Well-healed median sternotomy scar.  EKG:  Normal sinus rhythm without significant abnormalities.  Lab Results  Component Value Date   WBC 6.3 05/11/2017   HGB 7.9 (L) 05/11/2017   HCT 24.3 (L) 05/11/2017   MCV 95.4 05/11/2017   PLT 319 05/11/2017    Lab Results  Component Value Date   NA 140 05/11/2017  K 3.3 (L) 05/11/2017   CL 103 07/20/2016   CO2 26 05/11/2017   BUN 16.1 05/11/2017   CREATININE 1.2 05/11/2017   GLUCOSE 152 (H) 05/11/2017   ALT 10 05/11/2017    Lab Results  Component Value Date   CHOL 121 05/11/2016   HDL 42 05/11/2016   LDLCALC 45 05/11/2016   TRIG 171 (H) 05/11/2016   CHOLHDL 2.9 05/11/2016    --------------------------------------------------------------------------------------------------  ASSESSMENT AND PLAN: Coronary artery disease without angina No chest pain reported by Mr. Fogg. He has noted some exertional dyspnea with his chemotherapy in the setting of significant anemia. We will continue his current medications for secondary prevention. If his anemia remains a problem, aspirin may need to be discontinued. I defer this  decision to his oncologist.  Shortness of breath This has worsened with chemotherapy over the last few weeks. Mr. Fatzinger is quite anemic, with a most recent hemoglobin 7.9. He has not had any active bleeding recently but has noted some intermittent hematuria in the past. He remains on low-dose aspirin. While aspirin is certainly recommended for secondary prevention setting of CAD and prior CABG, symptomatic anemia remains a problem, particularly with blood loss, aspirin will need to be discontinued. I will defer this decision to his oncologist. Mr. Haisley does not have any overt symptoms of heart failure. If his shortness of breath worsens with improvement of his anemia, echocardiogram will need to be considered to evaluate for new cardiomyopathy.  Atrial fibrillation Brief postoperative atrial fibrillation noted following CABG. No symptoms of recurrence. EKG demonstrates sinus rhythm. No further workup or treatment is planned this time.  Hypertension Blood pressure is well controlled today. Given recent hypotension and normal readings since then, I am agreement with continuing to hold lisinopril. It is reasonable to continue with Mr. Springs' current dose of metoprolol (12.5 mg twice a day).  Hyperlipidemia LDL was at goal when last checked a year ago. We will continue Mr. Cammon current dose of atorvastatin (40 mg daily) and recheck a fasting lipid panel today.  Follow-up: Return to clinic in 1 year.  Nelva Bush, MD 05/13/2017 9:03 AM

## 2017-05-16 ENCOUNTER — Ambulatory Visit: Payer: Medicare Other | Admitting: Internal Medicine

## 2017-05-18 ENCOUNTER — Ambulatory Visit: Payer: Medicare Other

## 2017-05-18 ENCOUNTER — Other Ambulatory Visit (HOSPITAL_BASED_OUTPATIENT_CLINIC_OR_DEPARTMENT_OTHER): Payer: Medicare Other

## 2017-05-18 ENCOUNTER — Ambulatory Visit (HOSPITAL_BASED_OUTPATIENT_CLINIC_OR_DEPARTMENT_OTHER): Payer: Medicare Other

## 2017-05-18 ENCOUNTER — Other Ambulatory Visit: Payer: Self-pay | Admitting: *Deleted

## 2017-05-18 VITALS — BP 133/71 | HR 70 | Temp 98.2°F | Resp 17

## 2017-05-18 DIAGNOSIS — C689 Malignant neoplasm of urinary organ, unspecified: Secondary | ICD-10-CM

## 2017-05-18 DIAGNOSIS — Z95828 Presence of other vascular implants and grafts: Secondary | ICD-10-CM

## 2017-05-18 DIAGNOSIS — Z5111 Encounter for antineoplastic chemotherapy: Secondary | ICD-10-CM

## 2017-05-18 DIAGNOSIS — C642 Malignant neoplasm of left kidney, except renal pelvis: Secondary | ICD-10-CM | POA: Diagnosis not present

## 2017-05-18 DIAGNOSIS — C7800 Secondary malignant neoplasm of unspecified lung: Secondary | ICD-10-CM

## 2017-05-18 LAB — CBC WITH DIFFERENTIAL/PLATELET
BASO%: 0.6 % (ref 0.0–2.0)
Basophils Absolute: 0 10*3/uL (ref 0.0–0.1)
EOS ABS: 0 10*3/uL (ref 0.0–0.5)
EOS%: 0.2 % (ref 0.0–7.0)
HEMATOCRIT: 23.7 % — AB (ref 38.4–49.9)
HGB: 7.3 g/dL — ABNORMAL LOW (ref 13.0–17.1)
LYMPH#: 0.7 10*3/uL — AB (ref 0.9–3.3)
LYMPH%: 14.8 % (ref 14.0–49.0)
MCH: 30.2 pg (ref 27.2–33.4)
MCHC: 30.8 g/dL — ABNORMAL LOW (ref 32.0–36.0)
MCV: 97.9 fL (ref 79.3–98.0)
MONO#: 0.4 10*3/uL (ref 0.1–0.9)
MONO%: 9.1 % (ref 0.0–14.0)
NEUT%: 75.3 % — AB (ref 39.0–75.0)
NEUTROS ABS: 3.7 10*3/uL (ref 1.5–6.5)
PLATELETS: 130 10*3/uL — AB (ref 140–400)
RBC: 2.42 10*6/uL — AB (ref 4.20–5.82)
RDW: 16.7 % — ABNORMAL HIGH (ref 11.0–14.6)
WBC: 4.9 10*3/uL (ref 4.0–10.3)

## 2017-05-18 LAB — COMPREHENSIVE METABOLIC PANEL
ALBUMIN: 2.7 g/dL — AB (ref 3.5–5.0)
ALK PHOS: 96 U/L (ref 40–150)
ALT: 30 U/L (ref 0–55)
ANION GAP: 8 meq/L (ref 3–11)
AST: 32 U/L (ref 5–34)
BUN: 18.5 mg/dL (ref 7.0–26.0)
CALCIUM: 8.8 mg/dL (ref 8.4–10.4)
CO2: 25 mEq/L (ref 22–29)
Chloride: 105 mEq/L (ref 98–109)
Creatinine: 1 mg/dL (ref 0.7–1.3)
EGFR: >60 mL/min/{1.73_m2} (ref >60)
Glucose: 143 mg/dl — ABNORMAL HIGH (ref 70–140)
POTASSIUM: 3.8 meq/L (ref 3.5–5.1)
Sodium: 138 mEq/L (ref 136–145)
Total Bilirubin: 0.33 mg/dL (ref 0.20–1.20)
Total Protein: 6.5 g/dL (ref 6.4–8.3)

## 2017-05-18 MED ORDER — HEPARIN SOD (PORK) LOCK FLUSH 100 UNIT/ML IV SOLN
500.0000 [IU] | Freq: Once | INTRAVENOUS | Status: AC | PRN
  Administered 2017-05-18: 500 [IU]
  Filled 2017-05-18: qty 5

## 2017-05-18 MED ORDER — GEMCITABINE HCL CHEMO INJECTION 1 GM/26.3ML
800.0000 mg/m2 | Freq: Once | INTRAVENOUS | Status: AC
  Administered 2017-05-18: 1558 mg via INTRAVENOUS
  Filled 2017-05-18: qty 40.98

## 2017-05-18 MED ORDER — SODIUM CHLORIDE 0.9% FLUSH
10.0000 mL | INTRAVENOUS | Status: DC | PRN
  Administered 2017-05-18: 10 mL
  Filled 2017-05-18: qty 10

## 2017-05-18 MED ORDER — SODIUM CHLORIDE 0.9% FLUSH
10.0000 mL | INTRAVENOUS | Status: DC | PRN
  Administered 2017-05-18: 10 mL via INTRAVENOUS
  Filled 2017-05-18: qty 10

## 2017-05-18 MED ORDER — SODIUM CHLORIDE 0.9 % IV SOLN
Freq: Once | INTRAVENOUS | Status: AC
  Administered 2017-05-18: 14:00:00 via INTRAVENOUS

## 2017-05-18 MED ORDER — PROCHLORPERAZINE MALEATE 10 MG PO TABS
10.0000 mg | ORAL_TABLET | Freq: Once | ORAL | Status: AC
  Administered 2017-05-18: 10 mg via ORAL

## 2017-05-18 MED ORDER — PROCHLORPERAZINE MALEATE 10 MG PO TABS
ORAL_TABLET | ORAL | Status: AC
  Filled 2017-05-18: qty 1

## 2017-05-18 NOTE — Progress Notes (Signed)
Per MD, ok to treat with today's lab values.  Wylene Simmer, BSN, RN 05/18/2017 2:01 PM

## 2017-05-18 NOTE — Patient Instructions (Signed)
[facility] °Discharge Instructions for Patients Receiving Chemotherapy ° °Today you received the following chemotherapy agents Gemzar ° °To help prevent nausea and vomiting after your treatment, we encourage you to take your nausea medication as directed. °  °If you develop nausea and vomiting that is not controlled by your nausea medication, call the clinic.  ° °BELOW ARE SYMPTOMS THAT SHOULD BE REPORTED IMMEDIATELY: °· *FEVER GREATER THAN 100.5 F °· *CHILLS WITH OR WITHOUT FEVER °· NAUSEA AND VOMITING THAT IS NOT CONTROLLED WITH YOUR NAUSEA MEDICATION °· *UNUSUAL SHORTNESS OF BREATH °· *UNUSUAL BRUISING OR BLEEDING °· TENDERNESS IN MOUTH AND THROAT WITH OR WITHOUT PRESENCE OF ULCERS °· *URINARY PROBLEMS °· *BOWEL PROBLEMS °· UNUSUAL RASH °Items with * indicate a potential emergency and should be followed up as soon as possible. ° °Feel free to call the clinic should you have any questions or concerns. The clinic phone number is [phone]. ° °Please show the CHEMO ALERT CARD at check-in to the Emergency Department and triage nurse. ° ° °

## 2017-05-20 ENCOUNTER — Other Ambulatory Visit (HOSPITAL_BASED_OUTPATIENT_CLINIC_OR_DEPARTMENT_OTHER): Payer: Medicare Other

## 2017-05-20 ENCOUNTER — Ambulatory Visit (HOSPITAL_COMMUNITY)
Admission: RE | Admit: 2017-05-20 | Discharge: 2017-05-20 | Disposition: A | Discharge status: Home / Self Care | Payer: Medicare Other | Source: Ambulatory Visit | Attending: Oncology | Admitting: Oncology

## 2017-05-20 DIAGNOSIS — C642 Malignant neoplasm of left kidney, except renal pelvis: Secondary | ICD-10-CM

## 2017-05-20 DIAGNOSIS — C7800 Secondary malignant neoplasm of unspecified lung: Secondary | ICD-10-CM

## 2017-05-20 DIAGNOSIS — C689 Malignant neoplasm of urinary organ, unspecified: Secondary | ICD-10-CM

## 2017-05-20 DIAGNOSIS — D649 Anemia, unspecified: Secondary | ICD-10-CM | POA: Insufficient documentation

## 2017-05-20 LAB — CBC WITH DIFFERENTIAL/PLATELET
BASO%: 0.2 % (ref 0.0–2.0)
BASOS ABS: 0 10*3/uL (ref 0.0–0.1)
EOS ABS: 0 10*3/uL (ref 0.0–0.5)
EOS%: 0.2 % (ref 0.0–7.0)
HCT: 23.7 % — ABNORMAL LOW (ref 38.4–49.9)
HGB: 7.5 g/dL — ABNORMAL LOW (ref 13.0–17.1)
LYMPH%: 13.7 % — AB (ref 14.0–49.0)
MCH: 31 pg (ref 27.2–33.4)
MCHC: 31.6 g/dL — ABNORMAL LOW (ref 32.0–36.0)
MCV: 97.9 fL (ref 79.3–98.0)
MONO#: 0.2 10*3/uL (ref 0.1–0.9)
MONO%: 4 % (ref 0.0–14.0)
NEUT%: 81.9 % — AB (ref 39.0–75.0)
NEUTROS ABS: 4.1 10*3/uL (ref 1.5–6.5)
PLATELETS: 92 10*3/uL — AB (ref 140–400)
RBC: 2.42 10*6/uL — AB (ref 4.20–5.82)
RDW: 16.9 % — ABNORMAL HIGH (ref 11.0–14.6)
WBC: 5 10*3/uL (ref 4.0–10.3)
lymph#: 0.7 10*3/uL — ABNORMAL LOW (ref 0.9–3.3)

## 2017-05-20 LAB — COMPREHENSIVE METABOLIC PANEL
ALT: 84 U/L — AB (ref 0–55)
AST: 86 U/L — AB (ref 5–34)
Albumin: 2.7 g/dL — ABNORMAL LOW (ref 3.5–5.0)
Alkaline Phosphatase: 98 U/L (ref 40–150)
Anion Gap: 10 mEq/L (ref 3–11)
BUN: 18.7 mg/dL (ref 7.0–26.0)
CALCIUM: 8.8 mg/dL (ref 8.4–10.4)
CHLORIDE: 105 meq/L (ref 98–109)
CO2: 23 meq/L (ref 22–29)
CREATININE: 1.1 mg/dL (ref 0.7–1.3)
EGFR: >60 mL/min/{1.73_m2} (ref >60)
GLUCOSE: 112 mg/dL (ref 70–140)
POTASSIUM: 3.7 meq/L (ref 3.5–5.1)
SODIUM: 139 meq/L (ref 136–145)
Total Bilirubin: 0.54 mg/dL (ref 0.20–1.20)
Total Protein: 6.5 g/dL (ref 6.4–8.3)

## 2017-05-20 LAB — PREPARE RBC (CROSSMATCH)

## 2017-05-23 ENCOUNTER — Ambulatory Visit (HOSPITAL_COMMUNITY)
Admission: RE | Admit: 2017-05-23 | Discharge: 2017-05-23 | Disposition: A | Discharge status: Home / Self Care | Payer: Medicare Other | Source: Ambulatory Visit | Attending: Oncology | Admitting: Oncology

## 2017-05-23 DIAGNOSIS — C689 Malignant neoplasm of urinary organ, unspecified: Secondary | ICD-10-CM

## 2017-05-23 DIAGNOSIS — D649 Anemia, unspecified: Secondary | ICD-10-CM | POA: Diagnosis not present

## 2017-05-23 MED ORDER — ACETAMINOPHEN 325 MG PO TABS
650.0000 mg | ORAL_TABLET | Freq: Once | ORAL | Status: AC
  Administered 2017-05-23: 650 mg via ORAL
  Filled 2017-05-23: qty 2

## 2017-05-23 MED ORDER — DIPHENHYDRAMINE HCL 25 MG PO CAPS
25.0000 mg | ORAL_CAPSULE | Freq: Once | ORAL | Status: AC
  Administered 2017-05-23: 25 mg via ORAL
  Filled 2017-05-23: qty 1

## 2017-05-23 MED ORDER — SODIUM CHLORIDE 0.9 % IV SOLN: 250.0000 mL | Freq: Once | INTRAVENOUS | Status: DC

## 2017-05-23 MED ORDER — SODIUM CHLORIDE 0.9% FLUSH: 10.0000 mL | INTRAVENOUS | Status: DC | PRN

## 2017-05-23 MED ORDER — HEPARIN SOD (PORK) LOCK FLUSH 100 UNIT/ML IV SOLN
500.0000 [IU] | Freq: Every day | INTRAVENOUS | Status: AC | PRN
  Administered 2017-05-23: 500 [IU]
  Filled 2017-05-23: qty 5

## 2017-05-23 NOTE — Progress Notes (Signed)
Diagnosis:Anemia  Provider: [facility]  Procedure:  2 U PRBC transfused per protocol.  Patient tolerated well.  Vital signs stable, ambulatory at discharge,

## 2017-05-24 LAB — BPAM RBC
BLOOD PRODUCT EXPIRATION DATE: 201812162359
Blood Product Expiration Date: 201812172359
ISSUE DATE / TIME: 201811261108
ISSUE DATE / TIME: 201811261108
Unit Type and Rh: 7300
Unit Type and Rh: 7300

## 2017-05-24 LAB — TYPE AND SCREEN
ABO/RH(D): B POS
Antibody Screen: NEGATIVE
Unit division: 0
Unit division: 0

## 2017-05-25 ENCOUNTER — Ambulatory Visit: Payer: Medicare Other

## 2017-05-25 ENCOUNTER — Ambulatory Visit: Payer: Medicare Other | Admitting: Oncology

## 2017-05-25 ENCOUNTER — Other Ambulatory Visit: Payer: Medicare Other

## 2017-05-27 ENCOUNTER — Telehealth: Payer: Self-pay

## 2017-05-27 NOTE — Telephone Encounter (Signed)
Ok to stop ASA.

## 2017-05-27 NOTE — Telephone Encounter (Signed)
S/w pt per Dr Hazeline Junker message. No time frame given for when to restart.

## 2017-05-27 NOTE — Telephone Encounter (Signed)
Pt has blood in his urine late yesterday and today. It is bright red and clots. He feels it is from his tumor bleeding. He did have transfusion on Monday that helped him feel better for a few days. He is asking if he needs to discontinue his baby aspirin. He does not feel he needs to be seen today for anemia.

## 2017-06-01 ENCOUNTER — Ambulatory Visit (HOSPITAL_BASED_OUTPATIENT_CLINIC_OR_DEPARTMENT_OTHER): Payer: Medicare Other | Admitting: Oncology

## 2017-06-01 ENCOUNTER — Other Ambulatory Visit: Payer: Medicare Other

## 2017-06-01 ENCOUNTER — Ambulatory Visit (HOSPITAL_BASED_OUTPATIENT_CLINIC_OR_DEPARTMENT_OTHER): Payer: Medicare Other

## 2017-06-01 ENCOUNTER — Other Ambulatory Visit: Payer: Self-pay | Admitting: Oncology

## 2017-06-01 ENCOUNTER — Other Ambulatory Visit (HOSPITAL_BASED_OUTPATIENT_CLINIC_OR_DEPARTMENT_OTHER): Payer: Medicare Other

## 2017-06-01 ENCOUNTER — Ambulatory Visit: Payer: Medicare Other

## 2017-06-01 ENCOUNTER — Telehealth: Payer: Self-pay | Admitting: Oncology

## 2017-06-01 VITALS — BP 118/67 | HR 69 | Temp 97.9°F | Resp 17 | Ht 70.0 in | Wt 157.9 lb

## 2017-06-01 DIAGNOSIS — C642 Malignant neoplasm of left kidney, except renal pelvis: Secondary | ICD-10-CM

## 2017-06-01 DIAGNOSIS — Z5111 Encounter for antineoplastic chemotherapy: Secondary | ICD-10-CM

## 2017-06-01 DIAGNOSIS — C689 Malignant neoplasm of urinary organ, unspecified: Secondary | ICD-10-CM

## 2017-06-01 DIAGNOSIS — R109 Unspecified abdominal pain: Secondary | ICD-10-CM

## 2017-06-01 DIAGNOSIS — C7800 Secondary malignant neoplasm of unspecified lung: Secondary | ICD-10-CM

## 2017-06-01 DIAGNOSIS — R319 Hematuria, unspecified: Secondary | ICD-10-CM | POA: Diagnosis not present

## 2017-06-01 DIAGNOSIS — E039 Hypothyroidism, unspecified: Secondary | ICD-10-CM | POA: Diagnosis not present

## 2017-06-01 DIAGNOSIS — D649 Anemia, unspecified: Secondary | ICD-10-CM

## 2017-06-01 DIAGNOSIS — Z95828 Presence of other vascular implants and grafts: Secondary | ICD-10-CM

## 2017-06-01 LAB — CBC WITH DIFFERENTIAL/PLATELET
BASO%: 0.2 % (ref 0.0–2.0)
Basophils Absolute: 0 10*3/uL (ref 0.0–0.1)
EOS ABS: 0.2 10*3/uL (ref 0.0–0.5)
EOS%: 3.2 % (ref 0.0–7.0)
HCT: 25 % — ABNORMAL LOW (ref 38.4–49.9)
HGB: 8 g/dL — ABNORMAL LOW (ref 13.0–17.1)
LYMPH%: 12.7 % — AB (ref 14.0–49.0)
MCH: 30.8 pg (ref 27.2–33.4)
MCHC: 32 g/dL (ref 32.0–36.0)
MCV: 96.2 fL (ref 79.3–98.0)
MONO#: 0.9 10*3/uL (ref 0.1–0.9)
MONO%: 18.7 % — ABNORMAL HIGH (ref 0.0–14.0)
NEUT#: 3.3 10*3/uL (ref 1.5–6.5)
NEUT%: 65.2 % (ref 39.0–75.0)
PLATELETS: 107 10*3/uL — AB (ref 140–400)
RBC: 2.6 10*6/uL — AB (ref 4.20–5.82)
RDW: 16.5 % — ABNORMAL HIGH (ref 11.0–14.6)
WBC: 5 10*3/uL (ref 4.0–10.3)
lymph#: 0.6 10*3/uL — ABNORMAL LOW (ref 0.9–3.3)

## 2017-06-01 LAB — URINALYSIS, MICROSCOPIC - CHCC
BILIRUBIN (URINE): NEGATIVE
Glucose: NEGATIVE mg/dL
KETONES: NEGATIVE mg/dL
Nitrite: NEGATIVE
PH: 6 (ref 4.6–8.0)
Protein: 100 mg/dL
Specific Gravity, Urine: 1.015 (ref 1.003–1.035)
Urobilinogen, UR: 0.2 mg/dL (ref 0.2–1)

## 2017-06-01 LAB — COMPREHENSIVE METABOLIC PANEL
ALBUMIN: 2.7 g/dL — AB (ref 3.5–5.0)
ALK PHOS: 102 U/L (ref 40–150)
ALT: 15 U/L (ref 0–55)
AST: 13 U/L (ref 5–34)
Anion Gap: 10 mEq/L (ref 3–11)
BILIRUBIN TOTAL: 0.45 mg/dL (ref 0.20–1.20)
BUN: 17.5 mg/dL (ref 7.0–26.0)
CALCIUM: 8.6 mg/dL (ref 8.4–10.4)
CO2: 24 mEq/L (ref 22–29)
Chloride: 104 mEq/L (ref 98–109)
Creatinine: 1.4 mg/dL — ABNORMAL HIGH (ref 0.7–1.3)
EGFR: 50 mL/min/{1.73_m2} — ABNORMAL LOW (ref >60)
GLUCOSE: 165 mg/dL — AB (ref 70–140)
Potassium: 3.6 mEq/L (ref 3.5–5.1)
SODIUM: 138 meq/L (ref 136–145)
TOTAL PROTEIN: 6.2 g/dL — AB (ref 6.4–8.3)

## 2017-06-01 MED ORDER — DEXAMETHASONE SODIUM PHOSPHATE 10 MG/ML IJ SOLN
INTRAMUSCULAR | Status: AC
  Filled 2017-06-01: qty 1

## 2017-06-01 MED ORDER — SODIUM CHLORIDE 0.9 % IV SOLN: 800.0000 mg/m2 | Freq: Once | INTRAVENOUS | Status: DC

## 2017-06-01 MED ORDER — SODIUM CHLORIDE 0.9% FLUSH
10.0000 mL | INTRAVENOUS | Status: DC | PRN
  Administered 2017-06-01: 10 mL
  Filled 2017-06-01: qty 10

## 2017-06-01 MED ORDER — SODIUM CHLORIDE 0.9% FLUSH
10.0000 mL | INTRAVENOUS | Status: DC | PRN
  Administered 2017-06-01: 10 mL via INTRAVENOUS
  Filled 2017-06-01: qty 10

## 2017-06-01 MED ORDER — SODIUM CHLORIDE 0.9 % IV SOLN
Freq: Once | INTRAVENOUS | Status: AC
  Administered 2017-06-01: 10:00:00 via INTRAVENOUS

## 2017-06-01 MED ORDER — CARBOPLATIN CHEMO INJECTION 450 MG/45ML: 290.0000 mg | Freq: Once | INTRAVENOUS | Status: DC

## 2017-06-01 MED ORDER — PALONOSETRON HCL INJECTION 0.25 MG/5ML
0.2500 mg | Freq: Once | INTRAVENOUS | Status: AC
  Administered 2017-06-01: 0.25 mg via INTRAVENOUS

## 2017-06-01 MED ORDER — HEPARIN SOD (PORK) LOCK FLUSH 100 UNIT/ML IV SOLN
500.0000 [IU] | Freq: Once | INTRAVENOUS | Status: AC | PRN
  Administered 2017-06-01: 500 [IU]
  Filled 2017-06-01: qty 5

## 2017-06-01 MED ORDER — SODIUM CHLORIDE 0.9 % IV SOLN
290.0000 mg | Freq: Once | INTRAVENOUS | Status: AC
  Administered 2017-06-01: 290 mg via INTRAVENOUS
  Filled 2017-06-01: qty 29

## 2017-06-01 MED ORDER — PALONOSETRON HCL INJECTION 0.25 MG/5ML
INTRAVENOUS | Status: AC
  Filled 2017-06-01: qty 5

## 2017-06-01 MED ORDER — SODIUM CHLORIDE 0.9 % IV SOLN
800.0000 mg/m2 | Freq: Once | INTRAVENOUS | Status: AC
  Administered 2017-06-01: 1558 mg via INTRAVENOUS
  Filled 2017-06-01: qty 40.98

## 2017-06-01 MED ORDER — DEXAMETHASONE SODIUM PHOSPHATE 10 MG/ML IJ SOLN
10.0000 mg | Freq: Once | INTRAMUSCULAR | Status: AC
  Administered 2017-06-01: 10 mg via INTRAVENOUS

## 2017-06-01 NOTE — Patient Instructions (Signed)
[facility] Discharge Instructions for Patients Receiving Chemotherapy  Today you received the following chemotherapy agents Gemzar and Carboplatin   To help prevent nausea and vomiting after your treatment, we encourage you to take your nausea medication as directed.    If you develop nausea and vomiting that is not controlled by your nausea medication, call the clinic.   BELOW ARE SYMPTOMS THAT SHOULD BE REPORTED IMMEDIATELY:  *FEVER GREATER THAN 100.5 F  *CHILLS WITH OR WITHOUT FEVER  NAUSEA AND VOMITING THAT IS NOT CONTROLLED WITH YOUR NAUSEA MEDICATION  *UNUSUAL SHORTNESS OF BREATH  *UNUSUAL BRUISING OR BLEEDING  TENDERNESS IN MOUTH AND THROAT WITH OR WITHOUT PRESENCE OF ULCERS  *URINARY PROBLEMS  *BOWEL PROBLEMS  UNUSUAL RASH Items with * indicate a potential emergency and should be followed up as soon as possible.  Feel free to call the clinic should you have any questions or concerns. The clinic phone number is [phone].  Please show the CHEMO ALERT CARD at check-in to the Emergency Department and triage nurse.   

## 2017-06-01 NOTE — Progress Notes (Signed)
Nutrition Follow-up:  Patient with transitional cell carcinoma of left kidney with progression of disease.  Patient followed by Dr. [name].    Met with patient and wife during infusion this am.  Patient reports breakfast tends to be his best meal of the day.  Often skips lunch and supper is hit or miss.  Patient reports that he tried samples of oral nutrition shakes that were given at last visit and enjoyed the vanilla shakes but has not been drinking them.    Reports some nausea but controlled with medication. Also reports some constipation and helped with medication.  Patient reported did not try megace after reading potential side effects.    Medications: reviewed  Labs: glucose 165, creatinine 1.4  Anthropometrics:   Weight decreased today to 157 lb 14.4 oz from 163 lb on 10/24 last nutrition visit.    Noted 167 lb on 11/16 visit (question accuracy) 162 lb 9.6 oz on 11/7, 10/24 163 lb.     NUTRITION DIAGNOSIS: Inadequate oral intake continues   MALNUTRITION DIAGNOSIS: severe malnutrition continues   INTERVENTION:   Reviewed 24 hr recalled and offered suggestions on ways to increase calories and protein to current meal pattern.   Encouraged patient to start drinking oral nutrition supplements daily for added calories and protein and prevent further weight loss.  Encouraged patient to utilize medication to help with nausea and constipation as this can effect nutrition    MONITORING, EVALUATION, GOAL: patient will consume adequate calories and protein to meet nutritional needs and prevent further weight loss   NEXT VISIT: as needed during infusion  Shayley Medlin B. Zenia Resides, [place], [place] Registered Dietitian [phone] (pager)

## 2017-06-01 NOTE — Addendum Note (Signed)
Addended by: Wyatt Portela on: 06/01/2017 10:03 AM   Modules accepted: Orders

## 2017-06-01 NOTE — Progress Notes (Addendum)
Hematology and Oncology Follow Up Visit  [name] [name] [id number] March 04, 1946 71 y.o. 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 He presented with 4.9 cm mass of the upper pole of the left kidney with documented pulmonary metastasis. Neurological confirmation done on 04/15/2015 with a biopsy showed urothelial carcinoma.   Prior Therapy:   Status post biopsy of the left renal mass done on 04/15/2015. He is status post Port-A-Cath insertion on 05/21/2015. Systemic chemotherapy in the form of cisplatin and gemcitabine started on 05/27/2015. He is status post 6 cycles completed on 09/16/2015. CT scan on 01/06/2016 showed progression of disease. Pembrolizumab 200 mg every 3 weeks cycle 1 on 02/13/2016. Therapy concluded in October 2018 because of progression of disease.  Current therapy:  Carboplatin and gemcitabine chemotherapy started on 04/13/2017.  He is here for the start of cycle 3 of therapy.  Interim History:  [name] [name] presents today for a follow-up visit. Since the last visit, he reports few complaints.  He started developing worsening hematuria in the last week or so.  He reported clots at times although today the symptoms have resolved.  He denies any fevers or chills or abdominal pain.  He continues to have issues with flank pain related to his tumor.  He received 2 units of packed red cell transfusion after the last cycle of chemotherapy which helped his symptoms transiently.  He continues to report cold sensitivity.  His performance status mains adequate and not dramatically changed.  He does not report any headaches, blurry vision, syncope or seizures. He does not report any fevers, chills, or sweats. He does not report any chest pain, palpitation, orthopnea or leg edema. He does not report any wheezing or hemoptysis. He does not report any   vomiting, abdominal pain does report occasional dyspepsia. He does not report any frequency, urgency or hesitancy. He does not report any skeletal complaints. Remaining review of systems unremarkable.   Medications: I have reviewed the patient's current medications.  Current Outpatient Medications  Medication Sig Dispense Refill  . acetaminophen (TYLENOL) 325 MG tablet Take 650 mg by mouth every 6 (six) hours as needed for moderate pain or headache.     [name] aspirin 81 MG chewable tablet Chew 81 mg daily by mouth.    [name] atorvastatin (LIPITOR) 40 MG tablet Take 1 tablet (40 mg total) daily by mouth. 90 tablet 3  . benzonatate (TESSALON) 200 MG capsule TAKE ONE CAPSULE BY MOUTH 3 TIMES A DAY AS NEEDED FOR COUGH 30 capsule 0  . benzonatate (TESSALON) 200 MG capsule Take 1 capsule (200 mg total) by mouth 3 (three) times daily as needed for cough. 90 capsule 3  . Cholecalciferol (VITAMIN D) 1000 UNITS capsule Take 1,000 Units by mouth daily.      . diphenhydrAMINE (BENADRYL) 25 MG tablet Take 25 mg by mouth every 6 (six) hours as needed.    . docusate sodium (COLACE) 100 MG capsule Take 100 mg by mouth 2 (two) times daily as needed (constipation).    . famotidine (PEPCID) 20 MG tablet Take 1 tablet (20 mg total) by mouth daily. 30 tablet 2  . levothyroxine (SYNTHROID, LEVOTHROID) 100 MCG tablet Take 100 mcg daily before breakfast by mouth.    . lidocaine-prilocaine (EMLA) cream Apply to port-a-cath 1-2 hours prior to acces. Cover with saran wrap. 30 g 1  . metoprolol tartrate (LOPRESSOR) 25  MG tablet Take 0.5 tablets (12.5 mg total) 2 (two) times daily by mouth. 90 tablet 3  . nitroGLYCERIN (NITROSTAT) 0.4 MG SL tablet Place 1 tablet (0.4 mg total) every 5 (five) minutes as needed under the tongue. May repeat up to 3 doses. 100 tablet 3  . polycarbophil (FIBERCON) 625 MG tablet Take 625 mg by mouth 2 (two) times daily.     [name] PRESCRIPTION MEDICATION Inject 160 mg into the muscle daily. Gentamycin given for  3 days at [facility]    . prochlorperazine (COMPAZINE) 10 MG tablet Take 1 tablet (10 mg total) by mouth every 6 (six) hours as needed for nausea or vomiting. 30 tablet 0  . sildenafil (VIAGRA) 50 MG tablet Take 50 mg by mouth as needed for erectile dysfunction.     . traMADol (ULTRAM) 50 MG tablet Take 1 tablet (50 mg total) by mouth every 6 (six) hours as needed. 30 tablet 0   No current facility-administered medications for this visit.    Facility-Administered Medications Ordered in Other Visits  Medication Dose Route Frequency Provider Last Rate Last Dose  . sodium chloride flush (NS) 0.9 % injection 10 mL  10 mL Intravenous PRN Wyatt Portela, MD   10 mL at 04/20/17 6734     Allergies:  Allergies  Allergen Reactions  . Hydrocodone Other (See Comments)    Became too sedated    Past Medical History, Surgical history, Social history, and Family History were reviewed and updated.   Physical Exam: Blood pressure 118/67, pulse 69, temperature 97.9 F (36.6 C), temperature source Oral, resp. rate 17, height 5\' 10"  (1.778 m), weight 157 lb 14.4 oz (71.6 kg), SpO2 99 %. ECOG: 1 General appearance: Chronically ill-appearing gentleman appeared without distress today. Head: Normocephalic, without obvious abnormality no oral thrush or ulcers. Neck: no adenopathy no thyroid masses. Lymph nodes: Cervical, supraclavicular, and axillary nodes normal. Heart:regular rate and rhythm, S1, S2 normal, no murmur, click, rub or gallop Lung:chest clear, no wheezing, rales, normal symmetric air entry Abdomin: soft, non-tender, without masses or organomegaly no rebound or guarding. EXT:no erythema, induration, or nodules Skin: No rashes or petechiae.  Lab Results: Lab Results  Component Value Date   WBC 5.0 06/01/2017   HGB 8.0 (L) 06/01/2017   HCT 25.0 (L) 06/01/2017   MCV 96.2 06/01/2017   PLT 107 (L) 06/01/2017     Chemistry      Component Value Date/Time   NA 138 06/01/2017 0832    K 3.6 06/01/2017 0832   CL 103 07/20/2016 0843   CO2 24 06/01/2017 0832   BUN 17.5 06/01/2017 0832   CREATININE 1.4 (H) 06/01/2017 0832      Component Value Date/Time   CALCIUM 8.6 06/01/2017 0832   ALKPHOS 102 06/01/2017 0832   AST 13 06/01/2017 0832   ALT 15 06/01/2017 0832   BILITOT 0.45 06/01/2017 6147        71 year old gentleman with the following issues:  1. 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 He did have a pathological confirmation done on 04/15/2015 with the specimen showed urothelial carcinoma.  He is status post 6 cycles of therapy completed in March 2017. CT scan on 04/11/2017showed continuous response to therapy. He had further decrease in his bilateral pulmonary nodules as well as decrease in the left upper pole renal  lesion.   CT scan on 01/06/2016 showed mild progression of disease especially in his primary tumor. His pulmonary nodules have remained stable.   He is S/P Pembrolizumab between August 2017 and October 2018.  CT scan on 03/31/2017 showed clear progression of disease.  He is receiving salvage chemotherapy with carboplatin and gemcitabine.   The plan is to proceed with day 1 of cycle 3 of chemotherapy today and will repeat imaging studies for staging purposes before next week.  Further chemotherapy will be scheduled depending on the results of the CT scan.    2. Hypercalcemia: He received a Zometa on 03/24/2017 and his calcium was corrected.  His calcium on 06/01/2017 was within normal range.  3. IV access: Port-A-Cath remains in place without complications.  4. Hypotension: BP is back to normal at this time. I asked him to continue to hold BP meds.   5. Anemia: Hemoglobin improved slightly with transfusion but anticipate he will require a repeat transfusion next week.  6.  Hypothyroidism: He is currently on 100 g of Synthroid. He has follow up with endocrinology.   7.  Hematuria: Etiology likely related to his tumor although urinary tract infection will be investigated.  I also encouraged him to follow with urology if this continues to be an issue.  CT scan of the abdomen and pelvis will help determine that further.  8.  Flank pain: He has tramadol which she uses periodically.  We discussed using a stronger pain medication at this time and he declined.  9. Follow-up: Will be in one week for a clinical follow-up after repeat CT scan.  Zola Button, MD 12/5/20189:45 AM   The results of his CT scan obtained on 06/03/2017 was personally reviewed and discussed with the reviewing radiologist.  He appears to have positive response to therapy but a incidental pulmonary embolism was detected.  These findings were discussed with [name] [name] today.  He is minimally symptomatic at this time without any dyspnea or chest pain.  He feels reasonably fair at rest.  The plan is to start the Xarelto at 15 mg twice a day for the next 3 weeks and then to 20 mg daily after that.  Risks and benefits associated with this medication were reviewed today.  Complications include increased bleeding is the main issue.  I also gave him clear instructions to report to the emergency department if he develops worsening shortness of breath or chest pain.  He is also instructed to report to the emergency department if he has increase in his hematuria.  He has an MD follow-up next week to follow his progress.  All his questions were answered today to his satisfaction.  Zola Button MD 06/03/2017.

## 2017-06-01 NOTE — Telephone Encounter (Signed)
Gave avs and calendar for December  °

## 2017-06-01 NOTE — Progress Notes (Signed)
Per Dr [name] [facility] ) it is okay to treat pt today with chemo.

## 2017-06-03 ENCOUNTER — Other Ambulatory Visit: Payer: Self-pay | Admitting: *Deleted

## 2017-06-03 ENCOUNTER — Telehealth: Payer: Self-pay | Admitting: *Deleted

## 2017-06-03 ENCOUNTER — Ambulatory Visit (HOSPITAL_COMMUNITY)
Admission: RE | Admit: 2017-06-03 | Discharge: 2017-06-03 | Disposition: A | Discharge status: Home / Self Care | Payer: Medicare Other | Source: Ambulatory Visit | Attending: Oncology | Admitting: Oncology

## 2017-06-03 DIAGNOSIS — N289 Disorder of kidney and ureter, unspecified: Secondary | ICD-10-CM | POA: Diagnosis not present

## 2017-06-03 DIAGNOSIS — Z85528 Personal history of other malignant neoplasm of kidney: Secondary | ICD-10-CM | POA: Insufficient documentation

## 2017-06-03 DIAGNOSIS — C649 Malignant neoplasm of unspecified kidney, except renal pelvis: Secondary | ICD-10-CM | POA: Diagnosis not present

## 2017-06-03 DIAGNOSIS — I700 Atherosclerosis of aorta: Secondary | ICD-10-CM | POA: Diagnosis not present

## 2017-06-03 DIAGNOSIS — C689 Malignant neoplasm of urinary organ, unspecified: Secondary | ICD-10-CM

## 2017-06-03 DIAGNOSIS — R911 Solitary pulmonary nodule: Secondary | ICD-10-CM | POA: Diagnosis not present

## 2017-06-03 MED ORDER — IOPAMIDOL (ISOVUE-300) INJECTION 61%
100.0000 mL | Freq: Once | INTRAVENOUS | Status: AC | PRN
  Administered 2017-06-03: 100 mL via INTRAVENOUS

## 2017-06-03 MED ORDER — PROCHLORPERAZINE MALEATE 10 MG PO TABS: 10.0000 mg | ORAL_TABLET | Freq: Four times a day (QID) | ORAL | 0 refills | Status: AC | PRN

## 2017-06-03 MED ORDER — RIVAROXABAN (XARELTO) VTE STARTER PACK (15 & 20 MG): ORAL_TABLET | ORAL | 0 refills | Status: DC

## 2017-06-03 MED ORDER — IOPAMIDOL (ISOVUE-300) INJECTION 61%
INTRAVENOUS | Status: AC
  Filled 2017-06-03: qty 100

## 2017-06-03 MED FILL — XARELTO STARTER PACK: 15 & 20 | 30 days supply | Qty: 51 | Fill #0

## 2017-06-03 NOTE — Telephone Encounter (Signed)
Called patient, let him know that script ready for p/u at the cancer center for [place]

## 2017-06-03 NOTE — Addendum Note (Signed)
Addended by: Wyatt Portela on: 06/03/2017 03:33 PM   Modules accepted: Orders

## 2017-06-08 ENCOUNTER — Other Ambulatory Visit (HOSPITAL_BASED_OUTPATIENT_CLINIC_OR_DEPARTMENT_OTHER): Payer: Medicare Other

## 2017-06-08 ENCOUNTER — Telehealth: Payer: Self-pay | Admitting: Oncology

## 2017-06-08 ENCOUNTER — Ambulatory Visit (HOSPITAL_BASED_OUTPATIENT_CLINIC_OR_DEPARTMENT_OTHER): Payer: Medicare Other | Admitting: Oncology

## 2017-06-08 ENCOUNTER — Ambulatory Visit (HOSPITAL_BASED_OUTPATIENT_CLINIC_OR_DEPARTMENT_OTHER): Payer: Medicare Other

## 2017-06-08 ENCOUNTER — Ambulatory Visit: Payer: Medicare Other

## 2017-06-08 ENCOUNTER — Ambulatory Visit (HOSPITAL_COMMUNITY)
Admission: RE | Admit: 2017-06-08 | Discharge: 2017-06-08 | Disposition: A | Discharge status: Home / Self Care | Payer: Medicare Other | Source: Ambulatory Visit | Attending: Oncology | Admitting: Oncology

## 2017-06-08 ENCOUNTER — Other Ambulatory Visit: Payer: Medicare Other

## 2017-06-08 VITALS — BP 135/78 | HR 78 | Temp 97.7°F | Resp 18 | Ht 70.0 in | Wt 154.4 lb

## 2017-06-08 DIAGNOSIS — C689 Malignant neoplasm of urinary organ, unspecified: Secondary | ICD-10-CM

## 2017-06-08 DIAGNOSIS — I2699 Other pulmonary embolism without acute cor pulmonale: Secondary | ICD-10-CM

## 2017-06-08 DIAGNOSIS — D6481 Anemia due to antineoplastic chemotherapy: Secondary | ICD-10-CM

## 2017-06-08 DIAGNOSIS — R0609 Other forms of dyspnea: Secondary | ICD-10-CM

## 2017-06-08 DIAGNOSIS — C7800 Secondary malignant neoplasm of unspecified lung: Secondary | ICD-10-CM | POA: Diagnosis not present

## 2017-06-08 DIAGNOSIS — R109 Unspecified abdominal pain: Secondary | ICD-10-CM

## 2017-06-08 DIAGNOSIS — D649 Anemia, unspecified: Secondary | ICD-10-CM | POA: Insufficient documentation

## 2017-06-08 DIAGNOSIS — R319 Hematuria, unspecified: Secondary | ICD-10-CM | POA: Diagnosis not present

## 2017-06-08 DIAGNOSIS — C642 Malignant neoplasm of left kidney, except renal pelvis: Secondary | ICD-10-CM

## 2017-06-08 DIAGNOSIS — E039 Hypothyroidism, unspecified: Secondary | ICD-10-CM | POA: Diagnosis not present

## 2017-06-08 LAB — COMPREHENSIVE METABOLIC PANEL
ALT: 21 U/L (ref 0–55)
ANION GAP: 12 meq/L — AB (ref 3–11)
AST: 22 U/L (ref 5–34)
Albumin: 2.8 g/dL — ABNORMAL LOW (ref 3.5–5.0)
Alkaline Phosphatase: 109 U/L (ref 40–150)
BUN: 22.2 mg/dL (ref 7.0–26.0)
CHLORIDE: 101 meq/L (ref 98–109)
CO2: 23 mEq/L (ref 22–29)
CREATININE: 1.4 mg/dL — AB (ref 0.7–1.3)
Calcium: 9.4 mg/dL (ref 8.4–10.4)
EGFR: 48 mL/min/{1.73_m2} — AB (ref >60)
Glucose: 122 mg/dl (ref 70–140)
Potassium: 4 mEq/L (ref 3.5–5.1)
Sodium: 137 mEq/L (ref 136–145)
Total Bilirubin: 0.37 mg/dL (ref 0.20–1.20)
Total Protein: 6.5 g/dL (ref 6.4–8.3)

## 2017-06-08 LAB — CBC WITH DIFFERENTIAL/PLATELET
BASO%: 0.5 % (ref 0.0–2.0)
BASOS ABS: 0 10*3/uL (ref 0.0–0.1)
EOS%: 0.7 % (ref 0.0–7.0)
Eosinophils Absolute: 0 10*3/uL (ref 0.0–0.5)
HEMATOCRIT: 23.1 % — AB (ref 38.4–49.9)
HGB: 7.4 g/dL — ABNORMAL LOW (ref 13.0–17.1)
LYMPH#: 0.8 10*3/uL — AB (ref 0.9–3.3)
LYMPH%: 19 % (ref 14.0–49.0)
MCH: 30.8 pg (ref 27.2–33.4)
MCHC: 32 g/dL (ref 32.0–36.0)
MCV: 96.3 fL (ref 79.3–98.0)
MONO#: 0.3 10*3/uL (ref 0.1–0.9)
MONO%: 7.9 % (ref 0.0–14.0)
NEUT#: 3.1 10*3/uL (ref 1.5–6.5)
NEUT%: 71.9 % (ref 39.0–75.0)
PLATELETS: 90 10*3/uL — AB (ref 140–400)
RBC: 2.4 10*6/uL — ABNORMAL LOW (ref 4.20–5.82)
RDW: 16.1 % — ABNORMAL HIGH (ref 11.0–14.6)
WBC: 4.3 10*3/uL (ref 4.0–10.3)

## 2017-06-08 LAB — PREPARE RBC (CROSSMATCH)

## 2017-06-08 MED ORDER — HEPARIN SOD (PORK) LOCK FLUSH 100 UNIT/ML IV SOLN
500.0000 [IU] | Freq: Every day | INTRAVENOUS | Status: AC | PRN
  Administered 2017-06-08: 500 [IU]
  Filled 2017-06-08: qty 5

## 2017-06-08 MED ORDER — DIPHENHYDRAMINE HCL 25 MG PO CAPS
25.0000 mg | ORAL_CAPSULE | Freq: Once | ORAL | Status: AC
  Administered 2017-06-08: 25 mg via ORAL

## 2017-06-08 MED ORDER — DIPHENHYDRAMINE HCL 25 MG PO CAPS
ORAL_CAPSULE | ORAL | Status: AC
  Filled 2017-06-08: qty 1

## 2017-06-08 MED ORDER — SODIUM CHLORIDE 0.9 % IV SOLN
250.0000 mL | Freq: Once | INTRAVENOUS | Status: AC
  Administered 2017-06-08: 250 mL via INTRAVENOUS

## 2017-06-08 MED ORDER — SODIUM CHLORIDE 0.9% FLUSH
10.0000 mL | INTRAVENOUS | Status: AC | PRN
  Administered 2017-06-08: 10 mL
  Filled 2017-06-08: qty 10

## 2017-06-08 MED ORDER — ACETAMINOPHEN 325 MG PO TABS
650.0000 mg | ORAL_TABLET | Freq: Once | ORAL | Status: AC
  Administered 2017-06-08: 650 mg via ORAL

## 2017-06-08 MED ORDER — ACETAMINOPHEN 325 MG PO TABS
ORAL_TABLET | ORAL | Status: AC
  Filled 2017-06-08: qty 2

## 2017-06-08 NOTE — Patient Instructions (Signed)
Implanted Port Home Guide An implanted port is a type of central line that is placed under the skin. Central lines are used to provide IV access when treatment or nutrition needs to be given through a person's veins. Implanted ports are used for long-term IV access. An implanted port may be placed because:  You need IV medicine that would be irritating to the small veins in your hands or arms.  You need long-term IV medicines, such as antibiotics.  You need IV nutrition for a long period.  You need frequent blood draws for lab tests.  You need dialysis.  Implanted ports are usually placed in the chest area, but they can also be placed in the upper arm, the abdomen, or the leg. An implanted port has two main parts:  Reservoir. The reservoir is round and will appear as a small, raised area under your skin. The reservoir is the part where a needle is inserted to give medicines or draw blood.  Catheter. The catheter is a thin, flexible tube that extends from the reservoir. The catheter is placed into a large vein. Medicine that is inserted into the reservoir goes into the catheter and then into the vein.  How will I care for my incision site? Do not get the incision site wet. Bathe or shower as directed by your health care provider. How is my port accessed? Special steps must be taken to access the port:  Before the port is accessed, a numbing cream can be placed on the skin. This helps numb the skin over the port site.  Your health care provider uses a sterile technique to access the port. ? Your health care provider must put on a mask and sterile gloves. ? The skin over your port is cleaned carefully with an antiseptic and allowed to dry. ? The port is gently pinched between sterile gloves, and a needle is inserted into the port.  Only "non-coring" port needles should be used to access the port. Once the port is accessed, a blood return should be checked. This helps ensure that the port  is in the vein and is not clogged.  If your port needs to remain accessed for a constant infusion, a clear (transparent) bandage will be placed over the needle site. The bandage and needle will need to be changed every week, or as directed by your health care provider.  Keep the bandage covering the needle clean and dry. Do not get it wet. Follow your health care provider's instructions on how to take a shower or bath while the port is accessed.  If your port does not need to stay accessed, no bandage is needed over the port.  What is flushing? Flushing helps keep the port from getting clogged. Follow your health care provider's instructions on how and when to flush the port. Ports are usually flushed with saline solution or a medicine called heparin. The need for flushing will depend on how the port is used.  If the port is used for intermittent medicines or blood draws, the port will need to be flushed: ? After medicines have been given. ? After blood has been drawn. ? As part of routine maintenance.  If a constant infusion is running, the port may not need to be flushed.  How long will my port stay implanted? The port can stay in for as long as your health care provider thinks it is needed. When it is time for the port to come out, surgery will be   done to remove it. The procedure is similar to the one performed when the port was put in. When should I seek immediate medical care? When you have an implanted port, you should seek immediate medical care if:  You notice a bad smell coming from the incision site.  You have swelling, redness, or drainage at the incision site.  You have more swelling or pain at the port site or the surrounding area.  You have a fever that is not controlled with medicine.  This information is not intended to replace advice given to you by your health care provider. Make sure you discuss any questions you have with your health care provider. Document  Released: 06/14/2005 Document Revised: 11/20/2015 Document Reviewed: 02/19/2013 [employer] Interactive Patient Education  2017 [employer]  

## 2017-06-08 NOTE — Telephone Encounter (Signed)
Gave avs and calendar for January 2019 waiting for 1/2

## 2017-06-08 NOTE — Progress Notes (Signed)
Hematology and Oncology Follow Up Visit  [name] [name] [id number] [birth year] 71 y.o. 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 He presented with 4.9 cm mass of the upper pole of the left kidney with documented pulmonary metastasis. Neurological confirmation done on 04/15/2015 with a biopsy showed urothelial carcinoma.   Prior Therapy:   Status post biopsy of the left renal mass done on 04/15/2015. He is status post Port-A-Cath insertion on 05/21/2015. Systemic chemotherapy in the form of cisplatin and gemcitabine started on 05/27/2015. He is status post 6 cycles completed on 09/16/2015. CT scan on 01/06/2016 showed progression of disease. Pembrolizumab 200 mg every 3 weeks cycle 1 on 02/13/2016. Therapy concluded in October 2018 because of progression of disease.  Current therapy:  Carboplatin and gemcitabine chemotherapy started on 04/13/2017.  He is status post 3 cycles of therapy.  Interim History:  [name] [name] presents today for a follow-up visit. Since the last visit, he received day 1 of cycle 3 of chemotherapy without new complications.  His hematuria increased in frequency slightly over the weekend but now is improving.  He does report dyspnea on exertion which has not improved or changed dramatically.  He was started on Xarelto because of new diagnosis of pulmonary embolism and his hematuria has not changed since that time.  He continues to have flank pain on the left side which is manageable by Tylenol.  His performance status is unchanged although not improved dramatically.  He continues to attend to activities of daily living.  He denied any lightheadedness, falls or syncope.  He does not report any headaches, blurry vision, seizures. He does not report any fevers, chills, or sweats. He does not report any chest pain, palpitation,  orthopnea or leg edema. He does not report any wheezing or hemoptysis. He does not report any  vomiting, abdominal pain does report occasional dyspepsia. He does not report any frequency, urgency or hesitancy. He does not report any skeletal complaints. Remaining review of systems unremarkable.   Medications: I have reviewed the patient's current medications.  Current Outpatient Medications  Medication Sig Dispense Refill  . acetaminophen (TYLENOL) 325 MG tablet Take 650 mg by mouth every 6 (six) hours as needed for moderate pain or headache.     [name] aspirin 81 MG chewable tablet Chew 81 mg daily by mouth.    [name] atorvastatin (LIPITOR) 40 MG tablet Take 1 tablet (40 mg total) daily by mouth. 90 tablet 3  . benzonatate (TESSALON) 200 MG capsule TAKE ONE CAPSULE BY MOUTH 3 TIMES A DAY AS NEEDED FOR COUGH 30 capsule 0  . benzonatate (TESSALON) 200 MG capsule Take 1 capsule (200 mg total) by mouth 3 (three) times daily as needed for cough. 90 capsule 3  . Cholecalciferol (VITAMIN D) 1000 UNITS capsule Take 1,000 Units by mouth daily.      . diphenhydrAMINE (BENADRYL) 25 MG tablet Take 25 mg by mouth every 6 (six) hours as needed.    . docusate sodium (COLACE) 100 MG capsule Take 100 mg by mouth 2 (two) times daily as needed (constipation).    . famotidine (PEPCID) 20 MG tablet Take 1 tablet (20 mg total) by mouth daily. 30 tablet 2  . levothyroxine (SYNTHROID, LEVOTHROID) 100 MCG tablet Take 100 mcg daily before breakfast by mouth.    . lidocaine-prilocaine (EMLA) cream Apply to port-a-cath 1-2 hours prior to acces. Cover  with saran wrap. 30 g 1  . metoprolol tartrate (LOPRESSOR) 25 MG tablet Take 0.5 tablets (12.5 mg total) 2 (two) times daily by mouth. 90 tablet 3  . nitroGLYCERIN (NITROSTAT) 0.4 MG SL tablet Place 1 tablet (0.4 mg total) every 5 (five) minutes as needed under the tongue. May repeat up to 3 doses. 100 tablet 3  . polycarbophil (FIBERCON) 625 MG tablet Take 625 mg by mouth 2 (two) times  daily.     [name] PRESCRIPTION MEDICATION Inject 160 mg into the muscle daily. Gentamycin given for 3 days at [facility]    . prochlorperazine (COMPAZINE) 10 MG tablet Take 1 tablet (10 mg total) by mouth every 6 (six) hours as needed for nausea or vomiting. 30 tablet 0  . Rivaroxaban 15 & 20 MG TBPK Take as directed on package: Start with one 15mg  tablet by mouth twice a day with food. On Day 22, switch to one 20mg  tablet once a day with food. 51 each 0  . sildenafil (VIAGRA) 50 MG tablet Take 50 mg by mouth as needed for erectile dysfunction.     . traMADol (ULTRAM) 50 MG tablet Take 1 tablet (50 mg total) by mouth every 6 (six) hours as needed. 30 tablet 0   No current facility-administered medications for this visit.    Facility-Administered Medications Ordered in Other Visits  Medication Dose Route Frequency Provider Last Rate Last Dose  . sodium chloride flush (NS) 0.9 % injection 10 mL  10 mL Intravenous PRN Wyatt Portela, MD   10 mL at 04/20/17 8841     Allergies:  Allergies  Allergen Reactions  . Hydrocodone Other (See Comments)    Became too sedated    Past Medical History, Surgical history, Social history, and Family History were reviewed and updated.   Physical Exam: Blood pressure 135/78, pulse 78, temperature 97.7 F (36.5 C), temperature source Oral, resp. rate 18, height 5\' 10"  (1.778 m), weight 154 lb 6.4 oz (70 kg), SpO2 100 %. ECOG: 1 General appearance: Comfortable appearing gentleman without distress. Head: Normocephalic, without obvious abnormality no oral ulcers or lesions. Neck: no adenopathy no thyroid masses. Lymph nodes: Cervical, supraclavicular, and axillary nodes normal. Heart:regular rate and rhythm, S1, S2 normal, no murmur, click, rub or gallop Lung:chest clear, no wheezing, rales, normal symmetric air entry Abdomin: soft, non-tender, without masses or organomegaly no sting dullness or ascites. EXT:no erythema, induration, or nodules Skin: No  rashes or petechiae.  Lab Results: Lab Results  Component Value Date   WBC 4.3 06/08/2017   HGB 7.4 (L) 06/08/2017   HCT 23.1 (L) 06/08/2017   MCV 96.3 06/08/2017   PLT 90 (L) 06/08/2017     Chemistry      Component Value Date/Time   NA 138 06/01/2017 0832   K 3.6 06/01/2017 0832   CL 103 07/20/2016 0843   CO2 24 06/01/2017 0832   BUN 17.5 06/01/2017 0832   CREATININE 1.4 (H) 06/01/2017 0832      Component Value Date/Time   CALCIUM 8.6 06/01/2017 0832   ALKPHOS 102 06/01/2017 0832   AST 13 06/01/2017 0832   ALT 15 06/01/2017 0832   BILITOT 0.45 06/01/2017 0832     EXAM: CT CHEST, ABDOMEN, AND PELVIS WITH CONTRAST  TECHNIQUE: Multidetector CT imaging of the chest, abdomen and pelvis was performed following the standard protocol during bolus administration of intravenous contrast.  CONTRAST:  121mL ISOVUE-300 IOPAMIDOL (ISOVUE-300) INJECTION 61%  COMPARISON:  03/31/2017  FINDINGS: CT CHEST FINDINGS  Cardiovascular: Previous median sternotomy and CABG procedure. Aortic atherosclerosis. Normal heart size. Aortic atherosclerosis and calcification of the native coronary artery is noted. Abnormal filling defects are identified within bilateral lower lobar pulmonary arteries as well as lingular segmental and bilateral lower lobe segmental pulmonary arteries.  Mediastinum/Nodes: The trachea appears patent and is midline. Normal appearance of the esophagus. No mediastinal or hilar adenopathy. No axillary or supraclavicular adenopathy.  Lungs/Pleura: No pleural effusion identified. Left upper lobe pulmonary nodule measures 9 mm, image 29 of series 4. Previously 10 mm. Index right lower lobe pulmonary nodule measures 6 mm, image 98 of series 4. Previously 2.4 cm. No new or enlarging nodules.  Musculoskeletal: Spondylosis identified within the thoracic spine. No aggressive lytic or sclerotic bone lesions.  CT ABDOMEN PELVIS FINDINGS  Hepatobiliary:  Stable cyst within segment 7 of the liver measuring 2 cm. No suspicious liver abnormalities. The gallbladder appears within normal limits. No biliary dilatation.  Pancreas: Normal appearance of the pancreas.  Spleen: Spleen is unremarkable.  Adrenals/Urinary Tract: The adrenal glands appear normal. Normal appearance of the right kidney. Large mass involving the upper pole measures 7.3 x 7.0 cm, image 64 of series 2. Previously 7.9 x 8.4 cm. 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 Bladder appears normal.  Stomach/Bowel: Stomach is unremarkable. The small bowel loops have a normal course and caliber. No the appendix is visualized and appears increased in diameter measuring 10 mm. No surrounding inflammatory changes. The appearance is similar to 03/31/2017. Normal appearance of the colon.  Vascular/Lymphatic: Aortic atherosclerosis. No aneurysm. Periaortic lymph node at the level of the left kidney measures 4.0 x 2.6 cm, image 67 of series 2. Previously 4.1 x 2.6 cm. The urinary bladder appears within normal limits.  Reproductive: Prostate is unremarkable.  Other: No abdominal wall hernia or abnormality. No abdominopelvic ascites.  Musculoskeletal: Degenerative disc disease noted within the lumbar spine.  IMPRESSION: 1. Bilateral lobar and segmental pulmonary artery filling defects compatible with pulmonary embolus. 2. The primary lesion within the left kidney is decreased in size from previous exam. The dominant nodule within the right lower lobe has also decreased in size in the interval. 3. No significant change and periaortic adenopathy. 4. 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 5.  Aortic Atherosclerosis (ICD10-I70.0). 6. Critical Value/emergent results were called by telephone at the time of interpretation on 06/03/2017 at 3:18 pm to Dr. Zola Button , who verbally  acknowledged these results.   71 year old gentleman with the following issues:  1. 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 He did have a pathological confirmation done on 04/15/2015 with the specimen showed urothelial carcinoma.  He is status post 6 cycles of therapy completed in March 2017. CT scan on 04/11/2017showed continuous response to therapy. He had further decrease in his bilateral pulmonary nodules as well as decrease in the left upper pole renal lesion.   CT scan on 01/06/2016 showed mild progression of disease especially in his primary tumor. His pulmonary nodules have remained stable.   He is S/P Pembrolizumab between August 2017 and October 2018.  CT scan on 03/31/2017 showed clear progression of disease.  He is receiving salvage chemotherapy with carboplatin and gemcitabine.   After 3 cycles of therapy, CT  scan obtained on 06/03/2017 was reviewed and discussed with the patient today.  He has a positive response to therapy with decrease in his lung metastasis as well as primary tumor.  The plan is to continue with salvage chemotherapy utilizing carboplatin and gemcitabine at a reduced dose.  He will receive this chemotherapy once every 3 weeks on day 1 and we will eliminate day 8 moving forward because of cytopenias.    2. Hypercalcemia: He received a Zometa on 03/24/2017 and his calcium was corrected.  His calcium on 06/01/2017 was within normal range.  3. IV access: Port-A-Cath remains in use without complications.  4. Hypotension: BP is back to normal at this time.  He will continue to hold his blood pressure medication based on previous discussions.  This was reiterated today.  5. Anemia: Hemoglobin decreased again today.  Etiology is related to hematuria as well as chemotherapy.  He received 2 units of  packed red cell transfusion.  6. Hypothyroidism: He is currently on 100 g of Synthroid. He has follow up with endocrinology.   7.  Hematuria: Related to his primary kidney neoplasm.  His tumor appears to be shrinking at this time based on imaging studies.  8.  Flank pain: He uses Tylenol at this time and continues to be manageable.  9.  Pulmonary embolism: He started on Xarelto without complications.  He will be on anticoagulation for at least 3-6 months.  10. Follow-up: Will be in 3 weeks to resume cycle 4 of chemotherapy.  Zola Button, MD 12/12/201811:34 AM   The results of his CT scan obtained on 06/03/2017 was personally reviewed and discussed with the reviewing radiologist.  He appears to have positive response to therapy but a incidental pulmonary embolism was detected.  These findings were discussed with [name] [name] today.  He is minimally symptomatic at this time without any dyspnea or chest pain.  He feels reasonably fair at rest.  The plan is to start the Xarelto at 15 mg twice a day for the next 3 weeks and then to 20 mg daily after that.  Risks and benefits associated with this medication were reviewed today.  Complications include increased bleeding is the main issue.  I also gave him clear instructions to report to the emergency department if he develops worsening shortness of breath or chest pain.  He is also instructed to report to the emergency department if he has increase in his hematuria.  He has an MD follow-up next week to follow his progress.  All his questions were answered today to his satisfaction.  Zola Button MD 06/03/2017.

## 2017-06-08 NOTE — Patient Instructions (Signed)

## 2017-06-09 LAB — TYPE AND SCREEN
ABO/RH(D): B POS
Antibody Screen: NEGATIVE
UNIT DIVISION: 0
Unit division: 0

## 2017-06-09 LAB — BPAM RBC
BLOOD PRODUCT EXPIRATION DATE: 201812272359
Blood Product Expiration Date: 201812272359
ISSUE DATE / TIME: 201812121356
ISSUE DATE / TIME: 201812121356
UNIT TYPE AND RH: 7300
Unit Type and Rh: 7300

## 2017-06-11 ENCOUNTER — Encounter: Payer: Self-pay | Admitting: Oncology

## 2017-06-13 ENCOUNTER — Inpatient Hospital Stay (HOSPITAL_COMMUNITY)
Admission: EM | Admit: 2017-06-13 | Discharge: 2017-06-17 | DRG: 674 | Disposition: A | Discharge status: Home / Self Care | Payer: Medicare Other | Attending: Internal Medicine | Admitting: Internal Medicine

## 2017-06-13 ENCOUNTER — Telehealth: Payer: Self-pay | Admitting: *Deleted

## 2017-06-13 ENCOUNTER — Encounter (HOSPITAL_COMMUNITY): Payer: Self-pay | Admitting: Emergency Medicine

## 2017-06-13 ENCOUNTER — Other Ambulatory Visit: Payer: Self-pay

## 2017-06-13 ENCOUNTER — Encounter: Payer: Self-pay | Admitting: *Deleted

## 2017-06-13 DIAGNOSIS — I129 Hypertensive chronic kidney disease with stage 1 through stage 4 chronic kidney disease, or unspecified chronic kidney disease: Secondary | ICD-10-CM | POA: Diagnosis present

## 2017-06-13 DIAGNOSIS — R319 Hematuria, unspecified: Secondary | ICD-10-CM | POA: Diagnosis not present

## 2017-06-13 DIAGNOSIS — E440 Moderate protein-calorie malnutrition: Secondary | ICD-10-CM | POA: Diagnosis not present

## 2017-06-13 DIAGNOSIS — Z86711 Personal history of pulmonary embolism: Secondary | ICD-10-CM

## 2017-06-13 DIAGNOSIS — I82413 Acute embolism and thrombosis of femoral vein, bilateral: Secondary | ICD-10-CM | POA: Diagnosis present

## 2017-06-13 DIAGNOSIS — Z79890 Hormone replacement therapy (postmenopausal): Secondary | ICD-10-CM

## 2017-06-13 DIAGNOSIS — K219 Gastro-esophageal reflux disease without esophagitis: Secondary | ICD-10-CM | POA: Diagnosis not present

## 2017-06-13 DIAGNOSIS — I482 Chronic atrial fibrillation: Secondary | ICD-10-CM | POA: Diagnosis not present

## 2017-06-13 DIAGNOSIS — N3289 Other specified disorders of bladder: Secondary | ICD-10-CM | POA: Diagnosis not present

## 2017-06-13 DIAGNOSIS — Z8249 Family history of ischemic heart disease and other diseases of the circulatory system: Secondary | ICD-10-CM

## 2017-06-13 DIAGNOSIS — R310 Gross hematuria: Secondary | ICD-10-CM | POA: Diagnosis present

## 2017-06-13 DIAGNOSIS — T451X5A Adverse effect of antineoplastic and immunosuppressive drugs, initial encounter: Secondary | ICD-10-CM | POA: Diagnosis present

## 2017-06-13 DIAGNOSIS — I82409 Acute embolism and thrombosis of unspecified deep veins of unspecified lower extremity: Secondary | ICD-10-CM | POA: Diagnosis not present

## 2017-06-13 DIAGNOSIS — D630 Anemia in neoplastic disease: Secondary | ICD-10-CM | POA: Diagnosis not present

## 2017-06-13 DIAGNOSIS — Z9221 Personal history of antineoplastic chemotherapy: Secondary | ICD-10-CM

## 2017-06-13 DIAGNOSIS — C689 Malignant neoplasm of urinary organ, unspecified: Secondary | ICD-10-CM | POA: Diagnosis present

## 2017-06-13 DIAGNOSIS — C678 Malignant neoplasm of overlapping sites of bladder: Secondary | ICD-10-CM | POA: Diagnosis not present

## 2017-06-13 DIAGNOSIS — Z801 Family history of malignant neoplasm of trachea, bronchus and lung: Secondary | ICD-10-CM | POA: Diagnosis not present

## 2017-06-13 DIAGNOSIS — Z7982 Long term (current) use of aspirin: Secondary | ICD-10-CM | POA: Diagnosis not present

## 2017-06-13 DIAGNOSIS — I4891 Unspecified atrial fibrillation: Secondary | ICD-10-CM | POA: Diagnosis present

## 2017-06-13 DIAGNOSIS — D649 Anemia, unspecified: Secondary | ICD-10-CM | POA: Diagnosis present

## 2017-06-13 DIAGNOSIS — N2889 Other specified disorders of kidney and ureter: Secondary | ICD-10-CM | POA: Diagnosis not present

## 2017-06-13 DIAGNOSIS — N183 Chronic kidney disease, stage 3 (moderate): Secondary | ICD-10-CM | POA: Diagnosis not present

## 2017-06-13 DIAGNOSIS — D6481 Anemia due to antineoplastic chemotherapy: Secondary | ICD-10-CM | POA: Diagnosis present

## 2017-06-13 DIAGNOSIS — D62 Acute posthemorrhagic anemia: Secondary | ICD-10-CM | POA: Diagnosis present

## 2017-06-13 DIAGNOSIS — I25810 Atherosclerosis of coronary artery bypass graft(s) without angina pectoris: Secondary | ICD-10-CM | POA: Diagnosis present

## 2017-06-13 DIAGNOSIS — Z87891 Personal history of nicotine dependence: Secondary | ICD-10-CM

## 2017-06-13 DIAGNOSIS — J449 Chronic obstructive pulmonary disease, unspecified: Secondary | ICD-10-CM | POA: Diagnosis not present

## 2017-06-13 DIAGNOSIS — R339 Retention of urine, unspecified: Secondary | ICD-10-CM | POA: Diagnosis not present

## 2017-06-13 DIAGNOSIS — I2699 Other pulmonary embolism without acute cor pulmonale: Secondary | ICD-10-CM

## 2017-06-13 DIAGNOSIS — I2510 Atherosclerotic heart disease of native coronary artery without angina pectoris: Secondary | ICD-10-CM | POA: Diagnosis present

## 2017-06-13 DIAGNOSIS — I739 Peripheral vascular disease, unspecified: Secondary | ICD-10-CM | POA: Diagnosis not present

## 2017-06-13 DIAGNOSIS — Z95828 Presence of other vascular implants and grafts: Secondary | ICD-10-CM

## 2017-06-13 DIAGNOSIS — D500 Iron deficiency anemia secondary to blood loss (chronic): Secondary | ICD-10-CM | POA: Diagnosis not present

## 2017-06-13 DIAGNOSIS — D6959 Other secondary thrombocytopenia: Secondary | ICD-10-CM | POA: Diagnosis not present

## 2017-06-13 DIAGNOSIS — C7800 Secondary malignant neoplasm of unspecified lung: Secondary | ICD-10-CM | POA: Diagnosis present

## 2017-06-13 DIAGNOSIS — C642 Malignant neoplasm of left kidney, except renal pelvis: Secondary | ICD-10-CM | POA: Diagnosis not present

## 2017-06-13 DIAGNOSIS — C801 Malignant (primary) neoplasm, unspecified: Secondary | ICD-10-CM | POA: Diagnosis not present

## 2017-06-13 DIAGNOSIS — I10 Essential (primary) hypertension: Secondary | ICD-10-CM | POA: Diagnosis present

## 2017-06-13 DIAGNOSIS — N320 Bladder-neck obstruction: Secondary | ICD-10-CM | POA: Diagnosis present

## 2017-06-13 DIAGNOSIS — C679 Malignant neoplasm of bladder, unspecified: Secondary | ICD-10-CM | POA: Diagnosis not present

## 2017-06-13 LAB — BASIC METABOLIC PANEL
ANION GAP: 7 (ref 5–15)
BUN: 22 mg/dL — ABNORMAL HIGH (ref 6–20)
CALCIUM: 9 mg/dL (ref 8.9–10.3)
CO2: 26 mmol/L (ref 22–32)
Chloride: 96 mmol/L — ABNORMAL LOW (ref 101–111)
Creatinine, Ser: 1.36 mg/dL — ABNORMAL HIGH (ref 0.61–1.24)
GFR calc non Af Amer: 50 mL/min — ABNORMAL LOW (ref >60)
GFR, EST AFRICAN AMERICAN: 58 mL/min — AB (ref >60)
Glucose, Bld: 161 mg/dL — ABNORMAL HIGH (ref 65–99)
Potassium: 3.4 mmol/L — ABNORMAL LOW (ref 3.5–5.1)
Sodium: 129 mmol/L — ABNORMAL LOW (ref 135–145)

## 2017-06-13 LAB — CBC
HCT: 19.6 % — ABNORMAL LOW (ref 39.0–52.0)
HEMOGLOBIN: 6.7 g/dL — AB (ref 13.0–17.0)
MCH: 31.6 pg (ref 26.0–34.0)
MCHC: 34.2 g/dL (ref 30.0–36.0)
MCV: 92.5 fL (ref 78.0–100.0)
Platelets: 40 10*3/uL — ABNORMAL LOW (ref 150–400)
RBC: 2.12 MIL/uL — AB (ref 4.22–5.81)
RDW: 15.5 % (ref 11.5–15.5)
WBC: 9.9 10*3/uL (ref 4.0–10.5)

## 2017-06-13 LAB — URINALYSIS, ROUTINE W REFLEX MICROSCOPIC: SQUAMOUS EPITHELIAL / LPF: NONE SEEN

## 2017-06-13 LAB — PREPARE RBC (CROSSMATCH)

## 2017-06-13 MED ORDER — SODIUM CHLORIDE 0.9 % IV SOLN: Freq: Once | INTRAVENOUS | Status: DC

## 2017-06-13 MED ORDER — LIDOCAINE-PRILOCAINE 2.5-2.5 % EX CREA
TOPICAL_CREAM | Freq: Once | CUTANEOUS | Status: AC
  Administered 2017-06-13: 20:00:00 via TOPICAL
  Filled 2017-06-13: qty 5

## 2017-06-13 MED ORDER — ACETAMINOPHEN 325 MG PO TABS
650.0000 mg | ORAL_TABLET | Freq: Once | ORAL | Status: AC
  Administered 2017-06-13: 650 mg via ORAL
  Filled 2017-06-13: qty 2

## 2017-06-13 MED ORDER — DIPHENHYDRAMINE HCL 25 MG PO CAPS
25.0000 mg | ORAL_CAPSULE | Freq: Once | ORAL | Status: AC
  Administered 2017-06-13: 25 mg via ORAL
  Filled 2017-06-13: qty 1

## 2017-06-13 NOTE — ED Notes (Addendum)
Lab called stating that due to blood clot affecting color of urine wont be able to run biochemicals.

## 2017-06-13 NOTE — ED Notes (Signed)
Blood administration  -------- 1 of 2 units  --------pre v/s  117/85 spo2 98  Hr 74  pluse 72 rr18  Temp 98.1  ------------   15 min mark  119/64 spo2 100 Hr 73 pluse 73 rr17 Temp 98.3

## 2017-06-13 NOTE — H&P (Signed)
[facility] History and Physical  [name] [name] GBT:[id number] DOB: 1945/11/15 DOA: 06/13/2017  Referring physician: Dr Eulis Foster PCP: Tonia Ghent, MD   Chief Complaint: Hematuria, gen'd weakness, DOE  HPI: [name] [name] is a 71 y.o. male with hx of stage IV metastatic urothelial cancer w/ left renal mass and pulm mets diagnosed in Nov 2016.  Has rec'd chemoRx and also immunotherapy.  F/B Dr [name].  Currently getting carbo/ gem every 3 wks. His last CT scan showed positive tumor response to therapy but showed also incidental pulm embolism (bilat lower lobes and lingula).  He was minimally symptomatic at that time on 12/7.  He was started on Xarelto. Overall the last week his mild intermittent hematuria has worsened into sig gross hematuria with clots and today he couldn't void and was very weak with DOE so came to the ED.  In the ED foley cath was passed and he is now putting out good amts of bloody urine.  Hb is 6.7 and 2u prbc are ordered.  Cr 1.36, baseline 1.1.  Plts 40k, down form 90k on 12/12.  Asked to see for admisison.   Pt denies any SOB, CP, fevers.  Has some L flank pain he attributes to the L renal tumor.  No hemoptysis or resp distress.  No abd pain.     Home meds: -tylenol/ lipitor 40/ vit D/ benadryl prn/ colace/ synthroid/ fibercon/ compazine/ zantac/ tessalon/ pepcid/ SL NTG/ viagra -ultram prn -ecasa 81 qd -rivaroxaban 15 bid , on D #22 switch to 20 mg daily  ROS  denies CP  no joint pain   no HA  no blurry vision  no rash  no diarrhea  no nausea/ vomiting   Past Medical History  Past Medical History:  Diagnosis Date  . Atrial fibrillation (HCC)    Breif, post-op CABG  . bladder ca dx'd 03/2015  . CAD (coronary artery disease)    Exertional chest pain prompted LHC 10/11 showing EF 55%, mild inferior hypokinesis, 90% prox RCA, 70% mid RCA, 80% distal RCA, 70% ostial PDA, 70% mPLV, 90% mid OM1 (large), 90-95% prox LAD. Pt had CABG with LIMA-LAD,  SVG-OMG1, seq SVG-PDA/PLV  . COPD (chronic obstructive pulmonary disease) ([place])    Quit smoking 10/11  . ED (erectile dysfunction) of organic origin   . GERD (gastroesophageal reflux disease)   . Hypertension    Past Surgical History  Past Surgical History:  Procedure Laterality Date  . CORONARY ARTERY BYPASS GRAFT     LIMA-LAD, SVG-OM1, seq SVG-PDA/PLV  . CYSTOSCOPY WITH RETROGRADE PYELOGRAM, URETEROSCOPY AND STENT PLACEMENT Left 04/08/2015   Procedure: CYSTOSCOPY WITH LEFT  RETROGRADE PYELOGRAM, BALLOON DILATION LEFT URETER, LEFT URETEROSCOPY,LEFT STENT PLACEMENT;  Surgeon: Nickie Retort, MD;  Location: [facility];  Service: Urology;  Laterality: Left;  . IR GENERIC HISTORICAL  07/06/2016   IR RADIOLOGIST EVAL & MGMT 07/06/2016 Aletta Edouard, MD [facility] INTER[facility] RAD  . IR GENERIC HISTORICAL  07/20/2016   IR US GUIDE VASC ACCESS RIGHT 07/20/2016 Aletta Edouard, MD [facility]  . IR GENERIC HISTORICAL  07/20/2016   IR RENAL SELECTIVE  UNI INC S&I MOD SED 07/20/2016 Aletta Edouard, MD [facility]   Family History  Family History  Problem Relation Age of Onset  . Arthritis Mother   . Hypertension Mother   . Sudden death Mother 54  . Arthritis Father   . Hypertension Father   . Lung cancer Father   . Stroke Maternal Uncle   . Heart  attack Neg Hx    Social History  reports that he quit smoking about 7 years ago. He has quit using smokeless tobacco. He reports that he does not drink alcohol or use drugs. Allergies  Allergies  Allergen Reactions  . Hydrocodone Other (See Comments)    Became too sedated   Home medications Prior to Admission medications   Medication Sig Start Date End Date Taking? Authorizing Provider  acetaminophen (TYLENOL) 325 MG tablet Take 650 mg by mouth every 6 (six) hours as needed for moderate pain or headache.    Yes [provider]  atorvastatin (LIPITOR) 40 MG tablet Take 1 tablet (40 mg total) daily by mouth. 05/13/17  Yes End, Harrell Gave, MD   Cholecalciferol (VITAMIN D) 1000 UNITS capsule Take 1,000 Units by mouth daily.     Yes [provider]  diphenhydrAMINE (BENADRYL) 25 MG tablet Take 25 mg by mouth every 6 (six) hours as needed for sleep.    Yes [provider]  docusate sodium (COLACE) 100 MG capsule Take 100 mg by mouth 2 (two) times daily as needed (constipation).   Yes [provider]  levothyroxine (SYNTHROID, LEVOTHROID) 100 MCG tablet Take 100 mcg daily before breakfast by mouth.   Yes [provider]  metoprolol tartrate (LOPRESSOR) 25 MG tablet Take 0.5 tablets (12.5 mg total) 2 (two) times daily by mouth. 05/13/17  Yes End, Harrell Gave, MD  polycarbophil (FIBERCON) 625 MG tablet Take 625 mg by mouth 2 (two) times daily.    Yes [provider]  prochlorperazine (COMPAZINE) 10 MG tablet Take 1 tablet (10 mg total) by mouth every 6 (six) hours as needed for nausea or vomiting. 06/03/17  Yes Wyatt Portela, MD  ranitidine (ZANTAC) 150 MG tablet Take 150 mg by mouth 2 (two) times daily.   Yes [provider]  Rivaroxaban 15 & 20 MG TBPK Take as directed on package: Start with one 15mg  tablet by mouth twice a day with food. On Day 22, switch to one 20mg  tablet once a day with food. 06/03/17  Yes Wyatt Portela, MD  aspirin 81 MG chewable tablet Chew 81 mg daily by mouth.    [provider]  benzonatate (TESSALON) 200 MG capsule TAKE ONE CAPSULE BY MOUTH 3 TIMES A DAY AS NEEDED FOR COUGH Patient not taking: Reported on 06/13/2017 03/08/17   Wyatt Portela, MD  benzonatate (TESSALON) 200 MG capsule Take 1 capsule (200 mg total) by mouth 3 (three) times daily as needed for cough. Patient not taking: Reported on 06/13/2017 03/24/17   Wyatt Portela, MD  famotidine (PEPCID) 20 MG tablet Take 1 tablet (20 mg total) by mouth daily. Patient not taking: Reported on 06/13/2017 06/19/16   Eugenie Filler, MD  lidocaine-prilocaine (EMLA) cream Apply to port-a-cath 1-2 hours  prior to acces. Cover with saran wrap. 05/16/15   Wyatt Portela, MD  nitroGLYCERIN (NITROSTAT) 0.4 MG SL tablet Place 1 tablet (0.4 mg total) every 5 (five) minutes as needed under the tongue. May repeat up to 3 doses. 05/13/17   End, Harrell Gave, MD  PRESCRIPTION MEDICATION Inject 160 mg into the muscle daily. Gentamycin given for 3 days at [facility]    [provider]  sildenafil (VIAGRA) 50 MG tablet Take 50 mg by mouth as needed for erectile dysfunction.     [provider]  traMADol (ULTRAM) 50 MG tablet Take 1 tablet (50 mg total) by mouth every 6 (six) hours as needed. Patient taking differently:  Take 50 mg by mouth every 6 (six) hours as needed for moderate pain or severe pain.  04/04/17   Wyatt Portela, MD   Liver Function Tests Recent Labs  Lab 06/08/17 1041  AST 22  ALT 21  ALKPHOS 109  BILITOT 0.37  PROT 6.5  ALBUMIN 2.8*   No results for input(s): LIPASE, AMYLASE in the last 168 hours. CBC Recent Labs  Lab 06/08/17 1041 06/13/17 1351  WBC 4.3 9.9  NEUTROABS 3.1  --   HGB 7.4* 6.7*  HCT 23.1* 19.6*  MCV 96.3 92.5  PLT 90* 40*   Basic Metabolic Panel Recent Labs  Lab 06/08/17 1041 06/13/17 1351  NA 137 129*  K 4.0 3.4*  CL  --  96*  CO2 23 26  GLUCOSE 122 161*  BUN 22.2 22*  CREATININE 1.4* 1.36*  CALCIUM 9.4 9.0     Vitals:   06/13/17 1800 06/13/17 1830 06/13/17 1837 06/13/17 1900  BP: 112/70 125/62 125/62 128/65  Pulse: 85 86 87 87  Resp: 15 16 (!) 21 19  Temp:      TempSrc:      SpO2: 100% 99% 100% 99%  Weight:      Height:       Exam: Gen alert older male, WDWN, no distress, calm No rash, cyanosis or gangrene Sclera anicteric, throat clear  No jvd or bruits Chest clear bilat, R upper chest ant port RRR no MRG  Abd soft ntnd no mass or ascites +bs GU normal male w foley draining dark red clear urine large amts MS no joint effusions or deformity Ext no LE edema / no wounds or ulcers Neuro is alert, Ox 3 ,  nf  Home meds: -tylenol/ lipitor 40/ vit D/ benadryl prn/ colace/ synthroid/ fibercon/ compazine/ zantac/ tessalon/ pepcid/ SL NTG/ viagra -ultram prn -ecasa 81 qd -rivaroxaban 15 bid , on D #22 switch to 20 mg daily  Hb 6.7  ptl 40k  WBC 9k   Na 129  K 3.4  BUN 22 Cr 1.36  CO2 26   Assessment: 1. Gross hematuria/ bladder outlet obstruction - have d/w Dr Burr Medico on call for [place], ok to hold xarelto. Will hold ASA as well.  Good UOP with foley in.   Urology has seen and made rec's.   2. Anemia, symptomatic - due to ABL and chronic disease/ malignancy/ chemo.  plan to give 2u prbc's.  No vol excess on exam 3. CKD stage 3 - baseline creat 1.2- 1.5 4. Pulm emboli, bilat - incidental finding on the f/u CT scan done to look for progression on 12/7.  Started on xarelto 12.7.  Hold for now.  ONC to f/u tomorrow.  5. L renal mass/ lung mets/ stage IV urothelial Ca 6. HTN - not taking any BP meds. BP's good 7. Hx afib - get EKG 8. CAD hx CABG  Plan - as above       [place] [facility] Pager [phone]   If 7PM-7AM, please contact night-coverage [email or web address] Password [email or web address] 06/13/2017, 7:49 PM         Hemautria, anticoag, needs prbc's.  What to do with anticoag, recent PE, metastatic urothelial cancer, unable to void at home, L flank pain, hx interm hematuria. Ca dx'd 2016, sp 6 cys gem/cis, complete 08/2015. Progression on CT 7/17.  Sp 3 cycle pembroliz, progression on 10/18  12/ 2017 - gross hematuria

## 2017-06-13 NOTE — ED Notes (Signed)
Bed assigned @ 2258 rm 1339

## 2017-06-13 NOTE — Telephone Encounter (Signed)
Wife calling to say patient is extremely SOB on E. Thinks he needs blood. Spoke with patient and he is c/o hematuria, with large blood clots, patient has already taken xralto today, and now is unable to urinate, they will report to the E.R. Now.

## 2017-06-13 NOTE — ED Notes (Signed)
Piston syringe irrigated 438ml NS catheter with initial dark red return with no clots, during irrigation urine color lightened to pink and returning well.

## 2017-06-13 NOTE — ED Provider Notes (Signed)
[facility] DEPT Provider Note   CSN: [id number] Arrival date & time: 06/13/17  1326     History   Chief Complaint Chief Complaint  Patient presents with  . Hematuria  . Cancer    HPI [name] [name] is a 71 y.o. male.  Patient presents for decreased urinary output, blood in urine, blood clots in urine, lower abdominal pain, and feeling weak.  He states that he was started on Xarelto, 3 days ago, for a "PE."  He thinks he is due for a blood transfusion.  He has had chronic hematuria, requiring blood transfusions, last given several days ago.  He is not sure about the source of the PE.  Headache, chest pain, focal weakness or paresthesia.  He is taking his usual medications.  There are no other known modifying factors.   HPI  Past Medical History:  Diagnosis Date  . Atrial fibrillation (HCC)    Breif, post-op CABG  . bladder ca dx'd 03/2015  . CAD (coronary artery disease)    Exertional chest pain prompted LHC 10/11 showing EF 55%, mild inferior hypokinesis, 90% prox RCA, 70% mid RCA, 80% distal RCA, 70% ostial PDA, 70% mPLV, 90% mid OM1 (large), 90-95% prox LAD. Pt had CABG with LIMA-LAD, SVG-OMG1, seq SVG-PDA/PLV  . COPD (chronic obstructive pulmonary disease) ([place])    Quit smoking 10/11  . ED (erectile dysfunction) of organic origin   . GERD (gastroesophageal reflux disease)   . Hypertension     Patient Active Problem List   Diagnosis Date Noted  . Anemia 06/17/2016  . Acute blood loss anemia 06/17/2016  . Port catheter in place 02/13/2016  . Urothelial cancer ([place]) 05/16/2015  . Renal mass, left   . Pulmonary nodule 09/19/2011  . Gross hematuria 09/09/2011  . Hyperlipidemia LDL goal <70 12/22/2010  . Atherosclerosis of coronary artery bypass graft 05/14/2010  . ATRIAL FIBRILLATION 05/14/2010  . CHEST PAIN 04/10/2010  . ARM PAIN, LEFT 04/01/2010  . TOBACCO ABUSE 01/19/2010  . COPD (chronic obstructive pulmonary disease) ([place])  01/19/2010  . Essential hypertension 01/06/2010  . GERD 01/06/2010  . ERECTILE DYSFUNCTION, ORGANIC 01/06/2010  . CHICKENPOX, HX OF 01/06/2010    Past Surgical History:  Procedure Laterality Date  . CORONARY ARTERY BYPASS GRAFT     LIMA-LAD, SVG-OM1, seq SVG-PDA/PLV  . CYSTOSCOPY WITH RETROGRADE PYELOGRAM, URETEROSCOPY AND STENT PLACEMENT Left 04/08/2015   Procedure: CYSTOSCOPY WITH LEFT  RETROGRADE PYELOGRAM, BALLOON DILATION LEFT URETER, LEFT URETEROSCOPY,LEFT STENT PLACEMENT;  Surgeon: Nickie Retort, MD;  Location: [facility];  Service: Urology;  Laterality: Left;  . IR GENERIC HISTORICAL  07/06/2016   IR RADIOLOGIST EVAL & MGMT 07/06/2016 Aletta Edouard, MD [facility] INTERV RAD  . IR GENERIC HISTORICAL  07/20/2016   IR US GUIDE VASC ACCESS RIGHT 07/20/2016 Aletta Edouard, MD [facility]  . IR GENERIC HISTORICAL  07/20/2016   IR RENAL SELECTIVE  UNI INC S&I MOD SED 07/20/2016 Aletta Edouard, MD [facility]       Home Medications    Prior to Admission medications   Medication Sig Start Date End Date Taking? Authorizing Provider  acetaminophen (TYLENOL) 325 MG tablet Take 650 mg by mouth every 6 (six) hours as needed for moderate pain or headache.    Yes [provider]  atorvastatin (LIPITOR) 40 MG tablet Take 1 tablet (40 mg total) daily by mouth. 05/13/17  Yes End, Harrell Gave, MD  Cholecalciferol (VITAMIN D) 1000 UNITS capsule Take 1,000 Units by mouth daily.  Yes [provider]  diphenhydrAMINE (BENADRYL) 25 MG tablet Take 25 mg by mouth every 6 (six) hours as needed for sleep.    Yes [provider]  docusate sodium (COLACE) 100 MG capsule Take 100 mg by mouth 2 (two) times daily as needed (constipation).   Yes [provider]  levothyroxine (SYNTHROID, LEVOTHROID) 100 MCG tablet Take 100 mcg daily before breakfast by mouth.   Yes [provider]  metoprolol tartrate (LOPRESSOR) 25 MG tablet Take 0.5 tablets (12.5 mg total) 2 (two)  times daily by mouth. 05/13/17  Yes End, Harrell Gave, MD  polycarbophil (FIBERCON) 625 MG tablet Take 625 mg by mouth 2 (two) times daily.    Yes [provider]  prochlorperazine (COMPAZINE) 10 MG tablet Take 1 tablet (10 mg total) by mouth every 6 (six) hours as needed for nausea or vomiting. 06/03/17  Yes Wyatt Portela, MD  ranitidine (ZANTAC) 150 MG tablet Take 150 mg by mouth 2 (two) times daily.   Yes [provider]  Rivaroxaban 15 & 20 MG TBPK Take as directed on package: Start with one 15mg  tablet by mouth twice a day with food. On Day 22, switch to one 20mg  tablet once a day with food. 06/03/17  Yes Wyatt Portela, MD  aspirin 81 MG chewable tablet Chew 81 mg daily by mouth.    [provider]  benzonatate (TESSALON) 200 MG capsule TAKE ONE CAPSULE BY MOUTH 3 TIMES A DAY AS NEEDED FOR COUGH Patient not taking: Reported on 06/13/2017 03/08/17   Wyatt Portela, MD  benzonatate (TESSALON) 200 MG capsule Take 1 capsule (200 mg total) by mouth 3 (three) times daily as needed for cough. Patient not taking: Reported on 06/13/2017 03/24/17   Wyatt Portela, MD  famotidine (PEPCID) 20 MG tablet Take 1 tablet (20 mg total) by mouth daily. Patient not taking: Reported on 06/13/2017 06/19/16   Eugenie Filler, MD  lidocaine-prilocaine (EMLA) cream Apply to port-a-cath 1-2 hours prior to acces. Cover with saran wrap. 05/16/15   Wyatt Portela, MD  nitroGLYCERIN (NITROSTAT) 0.4 MG SL tablet Place 1 tablet (0.4 mg total) every 5 (five) minutes as needed under the tongue. May repeat up to 3 doses. 05/13/17   End, Harrell Gave, MD  PRESCRIPTION MEDICATION Inject 160 mg into the muscle daily. Gentamycin given for 3 days at [facility]    [provider]  sildenafil (VIAGRA) 50 MG tablet Take 50 mg by mouth as needed for erectile dysfunction.     [provider]  traMADol (ULTRAM) 50 MG tablet Take 1 tablet (50 mg total) by mouth every 6 (six) hours as  needed. Patient taking differently: Take 50 mg by mouth every 6 (six) hours as needed for moderate pain or severe pain.  04/04/17   Wyatt Portela, MD    Family History Family History  Problem Relation Age of Onset  . Arthritis Mother   . Hypertension Mother   . Sudden death Mother 81  . Arthritis Father   . Hypertension Father   . Lung cancer Father   . Stroke Maternal Uncle   . Heart attack Neg Hx     Social History Social History   Tobacco Use  . Smoking status: Former Smoker    Last attempt to quit: 07/07/2009    Years since quitting: 7.9  . Smokeless tobacco: Former Network engineer Use Topics  . Alcohol use: No  . Drug use: No  Allergies   Hydrocodone   Review of Systems Review of Systems  All other systems reviewed and are negative.    Physical Exam Updated Vital Signs BP 119/66   Pulse 83   Temp 98.1 F (36.7 C) (Oral)   Resp 17   Ht 5\' 10"  (1.778 m)   Wt 69.9 kg (154 lb)   SpO2 100%   BMI 22.10 kg/m   Physical Exam  Constitutional: He is oriented to person, place, and time. He appears well-developed. He appears distressed.  Elderly, frail  HENT:  Head: Normocephalic and atraumatic.  Right Ear: External ear normal.  Left Ear: External ear normal.  Eyes: Conjunctivae and EOM are normal. Pupils are equal, round, and reactive to light.  Neck: Normal range of motion and phonation normal. Neck supple.  Cardiovascular: Normal rate, regular rhythm and normal heart sounds.  Pulmonary/Chest: Effort normal and breath sounds normal. He exhibits no bony tenderness.  Abdominal: Soft. There is tenderness (Suprapubic tenderness with questionable palpable mass.). There is guarding (Suprapubic).  Musculoskeletal: Normal range of motion. He exhibits no edema or deformity.  Neurological: He is alert and oriented to person, place, and time. No cranial nerve deficit or sensory deficit. He exhibits normal muscle tone. Coordination normal.  Skin: Skin is warm,  dry and intact.  Psychiatric: He has a normal mood and affect. His behavior is normal. Judgment and thought content normal.  Nursing note and vitals reviewed.    ED Treatments / Results  Labs (all labs ordered are listed, but only abnormal results are displayed) Labs Reviewed  URINALYSIS, ROUTINE W REFLEX MICROSCOPIC - Abnormal; Notable for the following components:      Result Value   Color, Urine RED (*)    APPearance TURBID (*)    Glucose, UA   (*)    Value: TEST NOT REPORTED DUE TO COLOR INTERFERENCE OF URINE PIGMENT   Hgb urine dipstick   (*)    Value: TEST NOT REPORTED DUE TO COLOR INTERFERENCE OF URINE PIGMENT   Bilirubin Urine   (*)    Value: TEST NOT REPORTED DUE TO COLOR INTERFERENCE OF URINE PIGMENT   Ketones, ur   (*)    Value: TEST NOT REPORTED DUE TO COLOR INTERFERENCE OF URINE PIGMENT   Protein, ur   (*)    Value: TEST NOT REPORTED DUE TO COLOR INTERFERENCE OF URINE PIGMENT   Nitrite   (*)    Value: TEST NOT REPORTED DUE TO COLOR INTERFERENCE OF URINE PIGMENT   Leukocytes, UA   (*)    Value: TEST NOT REPORTED DUE TO COLOR INTERFERENCE OF URINE PIGMENT   Bacteria, UA FEW (*)    All other components within normal limits  BASIC METABOLIC PANEL - Abnormal; Notable for the following components:   Sodium 129 (*)    Potassium 3.4 (*)    Chloride 96 (*)    Glucose, Bld 161 (*)    BUN 22 (*)    Creatinine, Ser 1.36 (*)    GFR calc non Af Amer 50 (*)    GFR calc Af Amer 58 (*)    All other components within normal limits  CBC - Abnormal; Notable for the following components:   RBC 2.12 (*)    Hemoglobin 6.7 (*)    HCT 19.6 (*)    Platelets 40 (*)    All other components within normal limits  URINE CULTURE  TYPE AND SCREEN  PREPARE RBC (CROSSMATCH)    EKG  EKG Interpretation None  Radiology No results found.  Procedures .Critical Care Performed by: Daleen Bo, MD Authorized by: Daleen Bo, MD   Critical care provider statement:     Critical care time (minutes):  40   Critical care start time:  06/13/2017 2:10 PM   Critical care end time:  06/13/2017 4:44 PM   Critical care was necessary to treat or prevent imminent or life-threatening deterioration of the following conditions:  Circulatory failure   Critical care was time spent personally by me on the following activities:  Blood draw for specimens, development of treatment plan with patient or surrogate, evaluation of patient's response to treatment, examination of patient, obtaining history from patient or surrogate, ordering and performing treatments and interventions, ordering and review of laboratory studies, pulse oximetry, re-evaluation of patient's condition and review of old charts   I assumed direction of critical care for this patient from another provider in my specialty: no      (including critical care time)  Medications Ordered in ED Medications  0.9 %  sodium chloride infusion (not administered)     Initial Impression / Assessment and Plan / ED Course  I have reviewed the triage vital signs and the nursing notes.  Pertinent labs & imaging results that were available during my care of the patient were reviewed by me and considered in my medical decision making (see chart for details).  Clinical Course as of Jun 13 1705  Mon Jun 13, 2017  1655 Case discussed with urology who will see the patient in the emergency department for evaluation and treatment recommendations  [EW]  1702 Case discussed with oncology who will see the patient after hospitalization, tomorrow.  She recommends hold Xarelto, no additional anticoagulants at this time.  [EW]  1702 Low, significant loss from recent baseline Hemoglobin: (!!) 6.7 [EW]  1702 Low Sodium: (!) 129 [EW]  1702 Low Chloride: (!) 96 [EW]  1703 High Glucose: (!) 161 [EW]  1703 High Creatinine: (!) 1.36 [EW]    Clinical Course User Index [EW] Daleen Bo, MD     Patient Vitals for the past 24 hrs:  BP  Temp Temp src Pulse Resp SpO2 Height Weight  06/13/17 1614 119/66 - - 83 17 100 % - -  06/13/17 1534 123/70 - - 84 18 99 % - -  06/13/17 1453 132/65 98.1 F (36.7 C) Oral 75 16 99 % - -  06/13/17 1346 (!) 146/72 (!) 97.4 F (36.3 C) Oral 90 18 100 % 5\' 10"  (1.778 m) 69.9 kg (154 lb)    4:42 PM Reevaluation with update and discussion. After initial assessment and treatment, an updated evaluation reveals patient is still uncomfortable, Foley catheter is not been initiated yet.  Plan on Foley catheter irrigation, to remove clots for symptomatic treatment. Daleen Bo   5:06 PM-Consult requested with hospitalist, for admission.   Following Foley catheter and 400 cc saline irrigation, patient had 1200 cc of urine and saline in the Foley bag  Final Clinical Impressions(s) / ED Diagnoses   Final diagnoses:  Gross hematuria  Urinary retention  Anemia, unspecified type  Cancer ([place])  Other pulmonary embolism without acute cor pulmonale, unspecified chronicity ([place])   Complicated patient, with endothelial cancer, pulmonary embolus, hematuria, and anemia.  Recently transfused, with hemoglobin less than 6, today.  Patient is unstable and will require treatment, blood transfusion, and close observation in the observed setting, likely stepdown unit.  Oncology and urology consulted.  Nursing Notes Reviewed/ Care Coordinated Applicable Imaging Reviewed Interpretation  of Laboratory Data incorporated into ED treatment   Plan: Admit         Daleen Bo, MD 06/14/17 [phone]

## 2017-06-13 NOTE — ED Notes (Signed)
Bladder scan complete. Three scans at center 385ml, 456ml, 47ml

## 2017-06-13 NOTE — ED Triage Notes (Signed)
Pt complaint of intermittent urinary retention and blood clots noted post starting blood thinner for clot in lungs; also scheduled for blood transfusion this afternoon.

## 2017-06-13 NOTE — ED Notes (Signed)
Delay in triage related to pt request to use restroom.

## 2017-06-13 NOTE — Consult Note (Signed)
Urology Consult Note   Requesting Attending Physician:  Daleen Bo, MD Service Providing Consult: Urology  Consulting Attending: Jeffie Pollock   Reason for Consult:  Hematuria, Clot retention  HPI: [name] [name] is seen in consultation for reasons noted above at the request of Daleen Bo, MD for evaluation of hematuria and clot renteion.  This is a 71 y.o. male with hx of metastatic urothelial cancer who presents with hematuria and clot retention.   Hx of left renal mass, biopsied and found to be urothelial ca, 03/2015. S/p 6 cycles of gem/cis, completed in 08/2015.Unfortunately had progression of disease on CT scan from 12/2015. Now s/p 3 cycles of pembrolizumab with disease progression on 03/2017 imaging.   Found to have pulmonary embolism and started on xarelto. Has had intermittent hematuria in the past. Also has intermittent L flank pain.   Presented to ED today given inability to void. Bladder scan in the Ed for > 400cc and passing clots. 3 way catheter placed by ED with >1L returned. Catheter flushed of clots and light pink when urology evaluated.   Cr 1.36 ([place] 1.2-1.5) , WBC 9.9, Hgb 6.6, U/a with significant color interference.   Given blood in ED  Past Medical History: Past Medical History:  Diagnosis Date  . Atrial fibrillation (HCC)    Breif, post-op CABG  . bladder ca dx'd 03/2015  . CAD (coronary artery disease)    Exertional chest pain prompted LHC 10/11 showing EF 55%, mild inferior hypokinesis, 90% prox RCA, 70% mid RCA, 80% distal RCA, 70% ostial PDA, 70% mPLV, 90% mid OM1 (large), 90-95% prox LAD. Pt had CABG with LIMA-LAD, SVG-OMG1, seq SVG-PDA/PLV  . COPD (chronic obstructive pulmonary disease) ([place])    Quit smoking 10/11  . ED (erectile dysfunction) of organic origin   . GERD (gastroesophageal reflux disease)   . Hypertension     Past Surgical History:  Past Surgical History:  Procedure Laterality Date  . CORONARY ARTERY BYPASS GRAFT      LIMA-LAD, SVG-OM1, seq SVG-PDA/PLV  . CYSTOSCOPY WITH RETROGRADE PYELOGRAM, URETEROSCOPY AND STENT PLACEMENT Left 04/08/2015   Procedure: CYSTOSCOPY WITH LEFT  RETROGRADE PYELOGRAM, BALLOON DILATION LEFT URETER, LEFT URETEROSCOPY,LEFT STENT PLACEMENT;  Surgeon: Nickie Retort, MD;  Location: [facility];  Service: Urology;  Laterality: Left;  . IR GENERIC HISTORICAL  07/06/2016   IR RADIOLOGIST EVAL & MGMT 07/06/2016 Aletta Edouard, MD [facility]  . IR GENERIC HISTORICAL  07/20/2016   IR US GUIDE VASC ACCESS RIGHT 07/20/2016 Aletta Edouard, MD [facility]  . IR GENERIC HISTORICAL  07/20/2016   IR RENAL SELECTIVE  UNI INC S&I MOD SED 07/20/2016 Aletta Edouard, MD [facility]    Medication: Current Facility-Administered Medications  Medication Dose Route Frequency Provider Last Rate Last Dose  . 0.9 %  sodium chloride infusion   Intravenous Once Daleen Bo, MD       Current Outpatient Medications  Medication Sig Dispense Refill  . acetaminophen (TYLENOL) 325 MG tablet Take 650 mg by mouth every 6 (six) hours as needed for moderate pain or headache.     [name] atorvastatin (LIPITOR) 40 MG tablet Take 1 tablet (40 mg total) daily by mouth. 90 tablet 3  . Cholecalciferol (VITAMIN D) 1000 UNITS capsule Take 1,000 Units by mouth daily.      . diphenhydrAMINE (BENADRYL) 25 MG tablet Take 25 mg by mouth every 6 (six) hours as needed for sleep.     [name] docusate sodium (COLACE) 100 MG capsule Take 100 mg  by mouth 2 (two) times daily as needed (constipation).    [name] levothyroxine (SYNTHROID, LEVOTHROID) 100 MCG tablet Take 100 mcg daily before breakfast by mouth.    . metoprolol tartrate (LOPRESSOR) 25 MG tablet Take 0.5 tablets (12.5 mg total) 2 (two) times daily by mouth. 90 tablet 3  . polycarbophil (FIBERCON) 625 MG tablet Take 625 mg by mouth 2 (two) times daily.     . prochlorperazine (COMPAZINE) 10 MG tablet Take 1 tablet (10 mg total) by mouth every 6 (six) hours as needed for nausea or vomiting.  30 tablet 0  . ranitidine (ZANTAC) 150 MG tablet Take 150 mg by mouth 2 (two) times daily.    . Rivaroxaban 15 & 20 MG TBPK Take as directed on package: Start with one 15mg  tablet by mouth twice a day with food. On Day 22, switch to one 20mg  tablet once a day with food. 51 each 0  . aspirin 81 MG chewable tablet Chew 81 mg daily by mouth.    . benzonatate (TESSALON) 200 MG capsule TAKE ONE CAPSULE BY MOUTH 3 TIMES A DAY AS NEEDED FOR COUGH (Patient not taking: Reported on 06/13/2017) 30 capsule 0  . benzonatate (TESSALON) 200 MG capsule Take 1 capsule (200 mg total) by mouth 3 (three) times daily as needed for cough. (Patient not taking: Reported on 06/13/2017) 90 capsule 3  . famotidine (PEPCID) 20 MG tablet Take 1 tablet (20 mg total) by mouth daily. (Patient not taking: Reported on 06/13/2017) 30 tablet 2  . lidocaine-prilocaine (EMLA) cream Apply to port-a-cath 1-2 hours prior to acces. Cover with saran wrap. 30 g 1  . nitroGLYCERIN (NITROSTAT) 0.4 MG SL tablet Place 1 tablet (0.4 mg total) every 5 (five) minutes as needed under the tongue. May repeat up to 3 doses. 100 tablet 3  . PRESCRIPTION MEDICATION Inject 160 mg into the muscle daily. Gentamycin given for 3 days at [facility]    . sildenafil (VIAGRA) 50 MG tablet Take 50 mg by mouth as needed for erectile dysfunction.     . traMADol (ULTRAM) 50 MG tablet Take 1 tablet (50 mg total) by mouth every 6 (six) hours as needed. (Patient taking differently: Take 50 mg by mouth every 6 (six) hours as needed for moderate pain or severe pain. ) 30 tablet 0   Facility-Administered Medications Ordered in Other Encounters  Medication Dose Route Frequency Provider Last Rate Last Dose  . sodium chloride flush (NS) 0.9 % injection 10 mL  10 mL Intravenous PRN Wyatt Portela, MD   10 mL at 04/20/17 6578    Allergies: Allergies  Allergen Reactions  . Hydrocodone Other (See Comments)    Became too sedated    Social History: Social History    Tobacco Use  . Smoking status: Former Smoker    Last attempt to quit: 07/07/2009    Years since quitting: 7.9  . Smokeless tobacco: Former Network engineer Use Topics  . Alcohol use: No  . Drug use: No    Family History Family History  Problem Relation Age of Onset  . Arthritis Mother   . Hypertension Mother   . Sudden death Mother 8  . Arthritis Father   . Hypertension Father   . Lung cancer Father   . Stroke Maternal Uncle   . Heart attack Neg Hx     Review of Systems 10 systems were reviewed and are negative except as noted specifically in the HPI.  Objective   Vital signs  in last 24 hours: BP 119/66   Pulse 83   Temp 98.1 F (36.7 C) (Oral)   Resp 17   Ht 5\' 10"  (1.778 m)   Wt 69.9 kg (154 lb)   SpO2 100%   BMI 22.10 kg/m   Physical Exam General: NAD, A&O, resting, appropriate HEENT: [place]/AT, EOMI, MMM Pulmonary: Normal work of breathing Cardiovascular: HDS, adequate peripheral perfusion Abdomen: Soft, NTTP, nondistended, . GU: 3 way in place, urine light pink and translucent , No CVA tenderness Extremities: warm and well perfused Neuro: Appropriate, no focal neurological deficits  Most Recent Labs: Lab Results  Component Value Date   WBC 9.9 06/13/2017   HGB 6.7 (LL) 06/13/2017   HCT 19.6 (L) 06/13/2017   PLT 40 (L) 06/13/2017    Lab Results  Component Value Date   NA 129 (L) 06/13/2017   K 3.4 (L) 06/13/2017   CL 96 (L) 06/13/2017   CO2 26 06/13/2017   BUN 22 (H) 06/13/2017   CREATININE 1.36 (H) 06/13/2017   CALCIUM 9.0 06/13/2017   MG 1.8 09/16/2015    Lab Results  Component Value Date   INR 1.02 07/20/2016   APTT 31 07/20/2016     IMAGING: No results found.  ------  Assessment:  71 y.o. male with mUCC with pulmonary embolism requiring anticoagulation. Presenting in clot retention. Catheter in good position and urine light pink.   Given > 1L in bladder, recommend catheter stay in place for 7 days. Recommend xarelto is  stopped, and urine and hemoglobin monitored for 36 hours. If hgb stable and urine continues to lighten, oK to try to transition back to oral anticoagulant. If alternatives to xarelto are available, recommend those be explored.   Patient can have TOV in office. If urine becomes darker and thicker, or catheter persistently is obstructed with clots, please contact urology prior to initiating CBI (as we would like to assess and flush catheter).   Recommendations: - Keep 3 way in place, no indication for CBI at this pnit  - Please re-bladder scan in 1-2 hours to ensure bladder is decompressed - TOV in 7 days, either in house or in office  - OK to restart anticoagulation if urine continues to lighted and hgb stabilizes for > 24 hours  - Agree with med onc consultation    Thank you for this consult. Please contact the urology consult pager with any further questions/concerns.  Will notify Dr. [name] of the admission.

## 2017-06-14 ENCOUNTER — Inpatient Hospital Stay (HOSPITAL_COMMUNITY): Payer: Medicare Other

## 2017-06-14 DIAGNOSIS — D500 Iron deficiency anemia secondary to blood loss (chronic): Secondary | ICD-10-CM

## 2017-06-14 DIAGNOSIS — D6481 Anemia due to antineoplastic chemotherapy: Secondary | ICD-10-CM

## 2017-06-14 DIAGNOSIS — C689 Malignant neoplasm of urinary organ, unspecified: Secondary | ICD-10-CM

## 2017-06-14 DIAGNOSIS — R319 Hematuria, unspecified: Secondary | ICD-10-CM

## 2017-06-14 DIAGNOSIS — I482 Chronic atrial fibrillation: Secondary | ICD-10-CM

## 2017-06-14 DIAGNOSIS — N2889 Other specified disorders of kidney and ureter: Secondary | ICD-10-CM

## 2017-06-14 DIAGNOSIS — C7800 Secondary malignant neoplasm of unspecified lung: Secondary | ICD-10-CM

## 2017-06-14 DIAGNOSIS — R310 Gross hematuria: Secondary | ICD-10-CM

## 2017-06-14 DIAGNOSIS — I2699 Other pulmonary embolism without acute cor pulmonale: Secondary | ICD-10-CM

## 2017-06-14 DIAGNOSIS — D649 Anemia, unspecified: Secondary | ICD-10-CM

## 2017-06-14 DIAGNOSIS — D62 Acute posthemorrhagic anemia: Secondary | ICD-10-CM

## 2017-06-14 DIAGNOSIS — C642 Malignant neoplasm of left kidney, except renal pelvis: Principal | ICD-10-CM

## 2017-06-14 LAB — CBC
HEMATOCRIT: 23.9 % — AB (ref 39.0–52.0)
HEMOGLOBIN: 8.3 g/dL — AB (ref 13.0–17.0)
MCH: 31 pg (ref 26.0–34.0)
MCHC: 34.7 g/dL (ref 30.0–36.0)
MCV: 89.2 fL (ref 78.0–100.0)
Platelets: 31 10*3/uL — ABNORMAL LOW (ref 150–400)
RBC: 2.68 MIL/uL — ABNORMAL LOW (ref 4.22–5.81)
RDW: 15.8 % — AB (ref 11.5–15.5)
WBC: 9.2 10*3/uL (ref 4.0–10.5)

## 2017-06-14 LAB — BASIC METABOLIC PANEL
ANION GAP: 7 (ref 5–15)
BUN: 19 mg/dL (ref 6–20)
CALCIUM: 9.2 mg/dL (ref 8.9–10.3)
CHLORIDE: 102 mmol/L (ref 101–111)
CO2: 25 mmol/L (ref 22–32)
CREATININE: 1.34 mg/dL — AB (ref 0.61–1.24)
GFR calc non Af Amer: 51 mL/min — ABNORMAL LOW (ref >60)
GFR, EST AFRICAN AMERICAN: 59 mL/min — AB (ref >60)
GLUCOSE: 147 mg/dL — AB (ref 65–99)
Potassium: 3.4 mmol/L — ABNORMAL LOW (ref 3.5–5.1)
Sodium: 134 mmol/L — ABNORMAL LOW (ref 135–145)

## 2017-06-14 LAB — URINE CULTURE

## 2017-06-14 MED ORDER — ACETAMINOPHEN 325 MG PO TABS
650.0000 mg | ORAL_TABLET | Freq: Four times a day (QID) | ORAL | Status: DC | PRN
  Administered 2017-06-14 – 2017-06-16 (×2): 650 mg via ORAL
  Filled 2017-06-14 (×2): qty 2

## 2017-06-14 MED ORDER — ONDANSETRON HCL 4 MG PO TABS: 4.0000 mg | ORAL_TABLET | Freq: Four times a day (QID) | ORAL | Status: DC | PRN

## 2017-06-14 MED ORDER — FAMOTIDINE 20 MG PO TABS
20.0000 mg | ORAL_TABLET | Freq: Two times a day (BID) | ORAL | Status: DC
  Administered 2017-06-14 – 2017-06-15 (×2): 20 mg via ORAL
  Filled 2017-06-14 (×7): qty 1

## 2017-06-14 MED ORDER — METOPROLOL TARTRATE 25 MG PO TABS
12.5000 mg | ORAL_TABLET | Freq: Two times a day (BID) | ORAL | Status: DC
  Administered 2017-06-14 – 2017-06-17 (×5): 12.5 mg via ORAL
  Filled 2017-06-14 (×7): qty 1

## 2017-06-14 MED ORDER — ATORVASTATIN CALCIUM 40 MG PO TABS
40.0000 mg | ORAL_TABLET | Freq: Every day | ORAL | Status: DC
  Administered 2017-06-15 – 2017-06-17 (×3): 40 mg via ORAL
  Filled 2017-06-14 (×4): qty 1

## 2017-06-14 MED ORDER — ACETAMINOPHEN 650 MG RE SUPP: 650.0000 mg | Freq: Four times a day (QID) | RECTAL | Status: DC | PRN

## 2017-06-14 MED ORDER — DOCUSATE SODIUM 100 MG PO CAPS: 100.0000 mg | ORAL_CAPSULE | Freq: Two times a day (BID) | ORAL | Status: DC | PRN

## 2017-06-14 MED ORDER — SODIUM CHLORIDE 0.9 % IV SOLN: 250.0000 mL | INTRAVENOUS | Status: DC | PRN

## 2017-06-14 MED ORDER — SODIUM CHLORIDE 0.9% FLUSH: 3.0000 mL | INTRAVENOUS | Status: DC | PRN

## 2017-06-14 MED ORDER — PROCHLORPERAZINE MALEATE 10 MG PO TABS: 10.0000 mg | ORAL_TABLET | Freq: Four times a day (QID) | ORAL | Status: DC | PRN

## 2017-06-14 MED ORDER — CALCIUM POLYCARBOPHIL 625 MG PO TABS
625.0000 mg | ORAL_TABLET | Freq: Two times a day (BID) | ORAL | Status: DC
  Administered 2017-06-14 – 2017-06-17 (×5): 625 mg via ORAL
  Filled 2017-06-14 (×7): qty 1

## 2017-06-14 MED ORDER — TRAMADOL HCL 50 MG PO TABS: 50.0000 mg | ORAL_TABLET | Freq: Four times a day (QID) | ORAL | Status: DC | PRN

## 2017-06-14 MED ORDER — DIPHENHYDRAMINE HCL 25 MG PO CAPS: 25.0000 mg | ORAL_CAPSULE | Freq: Four times a day (QID) | ORAL | Status: DC | PRN

## 2017-06-14 MED ORDER — SODIUM CHLORIDE 0.9% FLUSH
3.0000 mL | Freq: Two times a day (BID) | INTRAVENOUS | Status: DC
  Administered 2017-06-14 – 2017-06-15 (×4): 3 mL via INTRAVENOUS
  Administered 2017-06-15: 10 mL via INTRAVENOUS

## 2017-06-14 MED ORDER — LEVOTHYROXINE SODIUM 100 MCG PO TABS
100.0000 ug | ORAL_TABLET | Freq: Every day | ORAL | Status: DC
  Administered 2017-06-14 – 2017-06-17 (×4): 100 ug via ORAL
  Filled 2017-06-14 (×4): qty 1

## 2017-06-14 MED ORDER — ONDANSETRON HCL 4 MG/2ML IJ SOLN: 4.0000 mg | Freq: Four times a day (QID) | INTRAMUSCULAR | Status: DC | PRN

## 2017-06-14 MED ORDER — VITAMIN D 1000 UNITS PO TABS
1000.0000 [IU] | ORAL_TABLET | Freq: Every day | ORAL | Status: DC
  Administered 2017-06-15 – 2017-06-17 (×3): 1000 [IU] via ORAL
  Filled 2017-06-14 (×4): qty 1

## 2017-06-14 NOTE — Consult Note (Signed)
Urology Consult Note      Subjective: NAEON.  Urine dark cherry this morning Old clot flushed out, ~50cc.  Urine light pink after flushing this morning but darker this afternoon.  Hgb 8.3   Formal bladder ultrasound shows largely decompressed bladder with minimal echogenic debris, likely old clot  Objective: Vital signs in last 24 hours: Temp:  [97.4 F (36.3 C)-99 F (37.2 C)] 98 F (36.7 C) (12/18 0431) Pulse Rate:  [70-90] 71 (12/18 0431) Resp:  [15-24] 18 (12/18 0431) BP: (104-146)/(58-89) 126/76 (12/18 0431) SpO2:  [98 %-100 %] 98 % (12/18 0431) Weight:  [69.9 kg (154 lb)-70 kg (154 lb 5.2 oz)] 70 kg (154 lb 5.2 oz) (12/18 0015)  Intake/Output from previous day: 12/17 0701 - 12/18 0700 In: 1260 [Blood:1260] Out: 2450 [Urine:2450] Intake/Output this shift: Total I/O In: 120 [P.O.:120] Out: 850 [Urine:850]  Physical Exam:  General: Alert and oriented CV: RRR Lungs: Clear Abdomen: Soft, appropriately tender.  GU: Foley in place, urine light pink  Ext: NT, No erythema  Lab Results: Recent Labs    06/13/17 1351 06/14/17 0908  HGB 6.7* 8.3*  HCT 19.6* 23.9*   BMET Recent Labs    06/13/17 1351 06/14/17 0908  NA 129* 134*  K 3.4* 3.4*  CL 96* 102  CO2 26 25  GLUCOSE 161* 147*  BUN 22* 19  CREATININE 1.36* 1.34*  CALCIUM 9.0 9.2     Studies/Results: US Renal  Result Date: 06/14/2017 CLINICAL DATA:  Hematuria.  History of metastatic renal cell cancer. EXAM: RENAL / URINARY TRACT ULTRASOUND COMPLETE COMPARISON:  CT abdomen pelvis dated June 03, 2017. FINDINGS: Right Kidney: Length: 10.5 cm. Echogenicity within normal limits. No mass or hydronephrosis visualized. Left Kidney: Length: 12.2 cm. Large, heterogeneous mass involving the upper and midpole, measuring 7.7 x 7.1 x 5.2 cm, grossly unchanged when allowing for differences in technique. There is echogenic material within the collecting system. Bladder: The bladder is largely decompressed by a  Foley catheter, with a small amount of echogenic debris seen surrounding the Foley balloon and within the lumen of the Foley catheter. IMPRESSION: 1. Small amount of echogenic debris within the largely decompressed bladder, with additional echogenic debris seen in the lumen of the Foley catheter, likely representing blood products. 2. Large left renal mass, consistent with known renal cell carcinoma, grossly unchanged when compared to recent CT. Echogenic debris within the left renal collecting system may represent tumor and/or blood products. Electronically Signed   By: Titus Dubin M.D.   On: 06/14/2017 09:59    Assessment/Plan:  71 y.o. male with mUCC, admitted with clot retention and gross hematuria.  Overall doing well, catheter flushed and old clot delivered. Ultrasound encouraging that the majority of clot has been cleared from the bladder.   Started on CBI given thick dark urine. Likely bleed down from kidney. Will keep CBI on to ensure patients catheter does not clot off. Hopefull that bleeding will resolve in the next 24 hours and we can titrate off CBI.   Agree with holding xarelto at this time. If urine can remain thin and clear in next 24-48 hours and hgb stable, ok to restart anticoagulation   Discussed with Dr. [name]    LOS: 1 day   Alla Feeling, MD 06/14/2017, 10:43 AM

## 2017-06-14 NOTE — Progress Notes (Signed)
[facility] PROGRESS NOTE    Progress Note  [name] [name]  KXF:[id number] DOB: September 12, 1945 DOA: 06/13/2017 PCP: Tonia Ghent, MD     Brief Narrative:   [name] [name] is an 71 y.o. male past medical history of stage IV metastatic urothelial cancer with left renal mass and pulmonary metastases diagnosed in November 2016 has been on chemo and immunotherapy followed by Dr. [name] CT scan showed tumor response to therapy and the bilateral pulmonary embolism, since then he has been on Xarelto.  Came in to the ED for 1 week of hematuria which today turned into clots and proceeded to not being able to void, Foley catheter was placed in the ED, his hemoglobin on admission was 6.70  Assessment/Plan:   Gross hematuria/ Renal mass, left/bladder outlet obstruction/  Acute blood loss anemia: Discussed with oncology Xarelto and aspirin were held, Foley placed with good urine output. Urology was consulted and recommended a three-way Foley catheter and he also recommended to restart anticoagulation once urine has cleared and hemoglobin stable for greater than 24 hours. Oncology recommended to hold chemotherapy and Xarelto until hematuria clears. Order a bladder and renal ultrasound. S/p 2 unit of PRBC Hbg improved recheck in am.  Symptomatic anemia [place] 6.7 agree with 2 unit of packed red blood cells.  Hx of bilateral pulmonary embolism: Holding Xarelto until urine clears and hemoglobin stabilizes.  Essential hypertension BP stable on no anithypertensive medications  ATRIAL FIBRILLATION Rate control hold xarelto   DVT prophylaxis: SCD Family Communication:none Disposition Plan/Barrier to D/C: unable to detemine Code Status:     Code Status Orders  (From admission, onward)        Start     Ordered   06/14/17 0016  Full code  Continuous     06/14/17 0015    Code Status History    Date Active Date Inactive Code Status Order ID Comments User Context   06/17/2016 19:20  06/19/2016 15:54 Full Code [id number]  Elwin Mocha, MD Inpatient    Advance Directive Documentation     Most Recent Value  Type of Advance Directive  Healthcare Power of Attorney  Pre-existing out of facility DNR order (yellow form or pink MOST form)  No data  "MOST" Form in Place?  No data        IV Access:    Peripheral IV   Procedures and diagnostic studies:   No results found.   Medical Consultants:    None.  Anti-Infectives:   None  Subjective:    [name] [name] no complinas  Objective:    Vitals:   06/14/17 0015 06/14/17 0129 06/14/17 0315 06/14/17 0431  BP: 104/86 117/68 121/68 126/76  Pulse: 73 71 70 71  Resp: 18 18 18 18   Temp: 98.2 F (36.8 C) 98.3 F (36.8 C) 99 F (37.2 C) 98 F (36.7 C)  TempSrc: Oral Oral Oral Oral  SpO2:  99% 100% 98%  Weight: 70 kg (154 lb 5.2 oz)     Height: 5\' 10"  (1.778 m)       Intake/Output Summary (Last 24 hours) at 06/14/2017 0824 Last data filed at 06/14/2017 0808 Gross per 24 hour  Intake 1380 ml  Output 2450 ml  Net -1070 ml   Filed Weights   06/13/17 1346 06/14/17 0015  Weight: 69.9 kg (154 lb) 70 kg (154 lb 5.2 oz)    Exam: General exam: In no acute distress. Respiratory system: Good air movement and clear to auscultation. Cardiovascular system:  S1 & S2 heard, RRR.  Gastrointestinal system: Abdomen is nondistended, soft and nontender.  Central nervous system: Alert and oriented. No focal neurological deficits. Extremities: No pedal edema. Skin: No rashes, lesions or ulcers Psychiatry: Judgement and insight appear normal. Mood & affect appropriate.    Data Reviewed:    Labs: Basic Metabolic Panel: Recent Labs  Lab 06/08/17 1041 06/13/17 1351  NA 137 129*  K 4.0 3.4*  CL  --  96*  CO2 23 26  GLUCOSE 122 161*  BUN 22.2 22*  CREATININE 1.4* 1.36*  CALCIUM 9.4 9.0   GFR Estimated Creatinine Clearance: 47.9 mL/min (A) (by C-G formula based on SCr of 1.36 mg/dL (H)). Liver  Function Tests: Recent Labs  Lab 06/08/17 1041  AST 22  ALT 21  ALKPHOS 109  BILITOT 0.37  PROT 6.5  ALBUMIN 2.8*   No results for input(s): LIPASE, AMYLASE in the last 168 hours. No results for input(s): AMMONIA in the last 168 hours. Coagulation profile No results for input(s): INR, PROTIME in the last 168 hours.  CBC: Recent Labs  Lab 06/08/17 1041 06/13/17 1351  WBC 4.3 9.9  NEUTROABS 3.1  --   HGB 7.4* 6.7*  HCT 23.1* 19.6*  MCV 96.3 92.5  PLT 90* 40*   Cardiac Enzymes: No results for input(s): CKTOTAL, CKMB, CKMBINDEX, TROPONINI in the last 168 hours. BNP (last 3 results) No results for input(s): PROBNP in the last 8760 hours. CBG: No results for input(s): GLUCAP in the last 168 hours. D-Dimer: No results for input(s): DDIMER in the last 72 hours. Hgb A1c: No results for input(s): HGBA1C in the last 72 hours. Lipid Profile: No results for input(s): CHOL, HDL, LDLCALC, TRIG, CHOLHDL, LDLDIRECT in the last 72 hours. Thyroid function studies: No results for input(s): TSH, T4TOTAL, T3FREE, THYROIDAB in the last 72 hours.  Invalid input(s): FREET3 Anemia work up: No results for input(s): VITAMINB12, FOLATE, FERRITIN, TIBC, IRON, RETICCTPCT in the last 72 hours. Sepsis Labs: Recent Labs  Lab 06/08/17 1041 06/13/17 1351  WBC 4.3 9.9   Microbiology No results found for this or any previous visit (from the past 240 hour(s)).   Medications:   . atorvastatin  40 mg Oral Daily  . cholecalciferol  1,000 Units Oral Daily  . famotidine  20 mg Oral BID  . levothyroxine  100 mcg Oral QAC breakfast  . metoprolol tartrate  12.5 mg Oral BID  . polycarbophil  625 mg Oral BID  . sodium chloride flush  3 mL Intravenous Q12H   Continuous Infusions: . sodium chloride        LOS: 1 day   Charlynne Cousins  [facility] Pager [phone]  *Please refer to [email or web address], password [email or web address] to get updated schedule on who will round on this patient, as hospitalists  switch teams weekly. If 7PM-7AM, please contact night-coverage at [email or web address], password [email or web address] for any overnight needs.  06/14/2017, 8:24 AM

## 2017-06-14 NOTE — Progress Notes (Signed)
IP PROGRESS NOTE  Subjective:   [name] [name] known to me with history of advanced urothelial cancer.  He has also recent diagnosis pulmonary embolism and currently on Xarelto.  Patient was hospitalized for hematuria and worsening anemia.  Xarelto was withheld and he was transfused 2 units of packed red cells.  Foley catheter remain in place with very dark urine.  Objective:  Vital signs in last 24 hours: Temp:  [97.4 F (36.3 C)-99 F (37.2 C)] 98 F (36.7 C) (12/18 0431) Pulse Rate:  [70-90] 71 (12/18 0431) Resp:  [15-24] 18 (12/18 0431) BP: (104-146)/(58-89) 126/76 (12/18 0431) SpO2:  [98 %-100 %] 98 % (12/18 0431) Weight:  [154 lb (69.9 kg)-154 lb 5.2 oz (70 kg)] 154 lb 5.2 oz (70 kg) (12/18 0015) Weight change:     Intake/Output from previous day: 12/17 0701 - 12/18 0700 In: 1260 [Blood:1260] Out: 2450 [Urine:2450] Alert, awake gentleman appeared without distress. Mouth: mucous membranes moist, pharynx normal without lesions Resp: clear to auscultation bilaterally Cardio: regular rate and rhythm, S1, S2 normal, no murmur, click, rub or gallop GI: soft, non-tender; bowel sounds normal; no masses,  no organomegaly Extremities: extremities normal, atraumatic, no cyanosis or edema   Lab Results: Recent Labs    06/13/17 1351  WBC 9.9  HGB 6.7*  HCT 19.6*  PLT 40*    BMET Recent Labs    06/13/17 1351  NA 129*  K 3.4*  CL 96*  CO2 26  GLUCOSE 161*  BUN 22*  CREATININE 1.36*  CALCIUM 9.0    Studies/Results: No results found.  Medications: I have reviewed the patient's current medications.  Assessment/Plan:  71 year old gentleman with the following issues:  1. 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   He is currently receiving systemic chemotherapy that will be on hold until this issue  resolved.  2.  Hematuria: Continues to be an issue unrelated to his urothelial neoplasm.  Urology is following and currently has a Foley catheter in place.  3.  Pulmonary embolism: Xarelto was withheld.  I recommend continue to hold this medication for now until his hematuria clears.  If it does not and his hemoglobin continues to drop, I would recommend IVC filter placement and discontinuation of anticoagulation.  4.  Anemia: Multifactorial in nature mostly related to blood loss and chemotherapy.  I agree with transfusion at this time keep his hemoglobin at least above 8.  Will continue to follow with you.     LOS: 1 day   Zola Button 06/14/2017, 7:57 AM

## 2017-06-14 NOTE — Telephone Encounter (Signed)
No note

## 2017-06-14 NOTE — Plan of Care (Signed)
  Nutrition: Adequate nutrition will be maintained 06/14/2017 1739 - Progressing by Dorene Sorrow, RN   Pain Managment: General experience of comfort will improve 06/14/2017 1739 - Progressing by Dorene Sorrow, RN   Elimination: Will not experience complications related to bowel motility 06/14/2017 1739 - Progressing by Dorene Sorrow, RN

## 2017-06-14 NOTE — Progress Notes (Signed)
Urine light pink, almost clear, on slow drip One stringy clot noted  No signs or symptoms of clot obstruction  Will keep on slow drip overnight  Plan to wean CBI in morning

## 2017-06-15 LAB — CBC
HEMATOCRIT: 23.6 % — AB (ref 39.0–52.0)
HEMOGLOBIN: 8.1 g/dL — AB (ref 13.0–17.0)
MCH: 30.9 pg (ref 26.0–34.0)
MCHC: 34.3 g/dL (ref 30.0–36.0)
MCV: 90.1 fL (ref 78.0–100.0)
Platelets: 31 10*3/uL — ABNORMAL LOW (ref 150–400)
RBC: 2.62 MIL/uL — AB (ref 4.22–5.81)
RDW: 16 % — AB (ref 11.5–15.5)
WBC: 7.3 10*3/uL (ref 4.0–10.5)

## 2017-06-15 LAB — MRSA PCR SCREENING: MRSA by PCR: POSITIVE — AB

## 2017-06-15 MED ORDER — LORAZEPAM 2 MG/ML IJ SOLN: 0.5000 mg | Freq: Once | INTRAMUSCULAR | Status: DC

## 2017-06-15 MED ORDER — CHLORHEXIDINE GLUCONATE CLOTH 2 % EX PADS: 6.0000 | MEDICATED_PAD | Freq: Every day | CUTANEOUS | Status: DC

## 2017-06-15 MED ORDER — MUPIROCIN 2 % EX OINT
1.0000 "application " | TOPICAL_OINTMENT | Freq: Two times a day (BID) | CUTANEOUS | Status: DC
  Administered 2017-06-15 – 2017-06-17 (×4): 1 via NASAL
  Filled 2017-06-15: qty 22

## 2017-06-15 NOTE — Consult Note (Signed)
Urology Consult Note      Subjective: NAEON.  Urine light pink on minimal CBI Hgb stable at 8.1  CBI clamped this morning.   Objective: Vital signs in last 24 hours: Temp:  [97.5 F (36.4 C)-98.7 F (37.1 C)] 98.6 F (37 C) (12/19 0424) Pulse Rate:  [65-86] 86 (12/19 0424) Resp:  [16-18] 18 (12/19 0424) BP: (110-118)/(62-74) 116/74 (12/19 0424) SpO2:  [98 %-100 %] 98 % (12/19 0424) Weight:  [68.9 kg (152 lb)] 68.9 kg (152 lb) (12/19 0500)  Intake/Output from previous day: 12/18 0701 - 12/19 0700 In: 3670 [P.O.:960; I.V.:10] Out: 8400 [Urine:8400] Intake/Output this shift: No intake/output data recorded.  Physical Exam:  General: Alert and oriented CV: RRR Lungs: Clear Abdomen: Soft, non tender GU: Foley in place, urine light pink . CBI clamped  Ext: NT, No erythema  Lab Results: Recent Labs    06/13/17 1351 06/14/17 0908 06/15/17 0602  HGB 6.7* 8.3* 8.1*  HCT 19.6* 23.9* 23.6*   BMET Recent Labs    06/13/17 1351 06/14/17 0908  NA 129* 134*  K 3.4* 3.4*  CL 96* 102  CO2 26 25  GLUCOSE 161* 147*  BUN 22* 19  CREATININE 1.36* 1.34*  CALCIUM 9.0 9.2     Studies/Results: US Renal  Result Date: 06/14/2017 CLINICAL DATA:  Hematuria.  History of metastatic renal cell cancer. EXAM: RENAL / URINARY TRACT ULTRASOUND COMPLETE COMPARISON:  CT abdomen pelvis dated June 03, 2017. FINDINGS: Right Kidney: Length: 10.5 cm. Echogenicity within normal limits. No mass or hydronephrosis visualized. Left Kidney: Length: 12.2 cm. Large, heterogeneous mass involving the upper and midpole, measuring 7.7 x 7.1 x 5.2 cm, grossly unchanged when allowing for differences in technique. There is echogenic material within the collecting system. Bladder: The bladder is largely decompressed by a Foley catheter, with a small amount of echogenic debris seen surrounding the Foley balloon and within the lumen of the Foley catheter. IMPRESSION: 1. Small amount of echogenic debris within  the largely decompressed bladder, with additional echogenic debris seen in the lumen of the Foley catheter, likely representing blood products. 2. Large left renal mass, consistent with known renal cell carcinoma, grossly unchanged when compared to recent CT. Echogenic debris within the left renal collecting system may represent tumor and/or blood products. Electronically Signed   By: Titus Dubin M.D.   On: 06/14/2017 09:59    Assessment/Plan:  71 y.o. male with mUCC, admitted with clot retention and gross hematuria.  Overall doing well, urine light ipnk on minimal CBI.   - capping trial this morning, ok for nursing to flush foley PRN for concern of clot obstruction - if catheter clots off frequently, ok to start cbi after bladder is hand irrigated with 400cc. Please inform Urology of this  - if patient passes capping trial, reasonable to consider xarelto trial tomorrow vs discussion re: IVF filter   Discussed with Dr. [name]    LOS: 2 days   Alla Feeling, MD 06/15/2017, 8:11 AM

## 2017-06-15 NOTE — Progress Notes (Signed)
Initial Nutrition Assessment  DOCUMENTATION CODES:   Non-severe (moderate) malnutrition in context of chronic illness  INTERVENTION:   Encourage PO intake Will continue to monitor for needs  NUTRITION DIAGNOSIS:   Moderate Malnutrition related to cancer and cancer related treatments, chronic illness as evidenced by percent weight loss, moderate muscle depletion.  GOAL:   Patient will meet greater than or equal to 90% of their needs  MONITOR:   PO intake, Labs, Weight trends, I & O's  REASON FOR ASSESSMENT:   Malnutrition Screening Tool   ASSESSMENT:   71 y.o. male past medical history of stage IV metastatic urothelial cancer with left renal mass and pulmonary metastases diagnosed in November 2016 has been on chemo and immunotherapy followed by Dr. [name] CT scan showed tumor response to therapy and the bilateral pulmonary embolism, since then he has been on Xarelto.   Patient in room with wife at bedside. Pt saw [place] on 12/5, at that time pt c/o poor appetite, eating 1 meal a day with significant weight loss. Since that visit, pt's appetite has improved per wife. Pt ate all of his breakfast his morning and all of his hamburger at lunch. He also ate some chocolate cake. Pt eating much better.  Pt denies any issues with taste, swallowing or chewing.  Pt reports UBW of 175 lb. Pt has lost 21 lb since 10/8 (12% wt loss x 2 months, significant for time frame).  Medications: Vitamin D tablet daily, Fibercon tablet BID Labs reviewed: Low Na, K GFR: 51   NUTRITION - FOCUSED PHYSICAL EXAM:    Most Recent Value  Orbital Region  No depletion  Upper Arm Region  No depletion  Thoracic and Lumbar Region  Unable to assess  Buccal Region  No depletion  Temple Region  Moderate depletion  Clavicle Bone Region  Mild depletion  Clavicle and Acromion Bone Region  Mild depletion  Scapular Bone Region  Unable to assess  Dorsal Hand  No depletion  Patellar Region  Unable to  assess  Anterior Thigh Region  Unable to assess  Posterior Calf Region  Unable to assess  Edema (RD Assessment)  None       Diet Order:  Diet regular Room service appropriate? Yes; Fluid consistency: Thin  EDUCATION NEEDS:   Education needs have been addressed  Skin:  Skin Assessment: Reviewed RN Assessment  Last BM:  12/18  Height:   Ht Readings from Last 1 Encounters:  06/14/17 5\' 10"  (1.778 m)    Weight:   Wt Readings from Last 1 Encounters:  06/15/17 152 lb (68.9 kg)    Ideal Body Weight:  75.5 kg  BMI:  Body mass index is 21.81 kg/m.  Estimated Nutritional Needs:   Kcal:  2100-2300  Protein:  105-115g  Fluid:  2.3L/day  Clayton Bibles, MS, RD, LDN [facility] Pager: [phone] After Hours Pager: [phone]

## 2017-06-15 NOTE — Progress Notes (Addendum)
[facility] PROGRESS NOTE    Progress Note  [name] [name]  CVE:[id number] DOB: [birth year] DOA: 06/13/2017 PCP: Tonia Ghent, MD     Brief Narrative:   [name] [name] is an 71 y.o. male past medical history of stage IV metastatic urothelial cancer with left renal mass and pulmonary metastases diagnosed in November 2016 has been on chemo and immunotherapy followed by Dr. [name] CT scan showed tumor response to therapy and the bilateral pulmonary embolism, since then he has been on Xarelto.  Came in to the ED for 1 week of hematuria which today turned into clots and proceeded to not being able to void, Foley catheter was placed in the ED, his hemoglobin on admission was 6.70  Assessment/Plan:   Gross hematuria/ Renal mass, left/bladder outlet obstruction/  Acute blood loss anemia: CBI, stopped by urology, they will continue to readdress and use as needed.  If patient passes Trial is reasonable to consider Xarelto trial tomorrow as per urology recommendations. Renal ultrasound small amount of echogenic debris largely decompressing the bladder, with a large renal mass consistent with malignancy. S/p 2 unit of PRBC Hbg stable at 8.1.  Symptomatic anemia Hemoglobin stable at 8.1 agree with 2 unit of packed red blood cells.  Hx of bilateral pulmonary embolism: Holding Xarelto until urine clears and hemoglobin stabilizes.  Essential hypertension BP stable on no anithypertensive medications  Chronic ATRIAL FIBRILLATION Rate control hold xarelto   DVT prophylaxis: SCD Family Communication:none Disposition Plan/Barrier to D/C: Hopefully in 1 or 2 days. Code Status:     Code Status Orders  (From admission, onward)        Start     Ordered   06/14/17 0016  Full code  Continuous     06/14/17 0015    Code Status History    Date Active Date Inactive Code Status Order ID Comments User Context   06/17/2016 19:20 06/19/2016 15:54 Full Code [id number]  Elwin Mocha, MD  Inpatient    Advance Directive Documentation     Most Recent Value  Type of Advance Directive  Healthcare Power of Attorney  Pre-existing out of facility DNR order (yellow form or pink MOST form)  No data  "MOST" Form in Place?  No data        IV Access:    Peripheral IV   Procedures and diagnostic studies:   US Renal  Result Date: 06/14/2017 CLINICAL DATA:  Hematuria.  History of metastatic renal cell cancer. EXAM: RENAL / URINARY TRACT ULTRASOUND COMPLETE COMPARISON:  CT abdomen pelvis dated June 03, 2017. FINDINGS: Right Kidney: Length: 10.5 cm. Echogenicity within normal limits. No mass or hydronephrosis visualized. Left Kidney: Length: 12.2 cm. Large, heterogeneous mass involving the upper and midpole, measuring 7.7 x 7.1 x 5.2 cm, grossly unchanged when allowing for differences in technique. There is echogenic material within the collecting system. Bladder: The bladder is largely decompressed by a Foley catheter, with a small amount of echogenic debris seen surrounding the Foley balloon and within the lumen of the Foley catheter. IMPRESSION: 1. Small amount of echogenic debris within the largely decompressed bladder, with additional echogenic debris seen in the lumen of the Foley catheter, likely representing blood products. 2. Large left renal mass, consistent with known renal cell carcinoma, grossly unchanged when compared to recent CT. Echogenic debris within the left renal collecting system may represent tumor and/or blood products. Electronically Signed   By: Titus Dubin M.D.   On: 06/14/2017 09:59  Medical Consultants:    None.  Anti-Infectives:   None  Subjective:    [name] [name] no complins  Objective:    Vitals:   06/14/17 2103 06/15/17 0028 06/15/17 0424 06/15/17 0500  BP: 113/69  116/74   Pulse: 67  86   Resp: 16  18   Temp: 98.7 F (37.1 C)  98.6 F (37 C)   TempSrc: Oral  Oral   SpO2: 100%  98%   Weight:  68.9 kg (152 lb)  68.9 kg  (152 lb)  Height:        Intake/Output Summary (Last 24 hours) at 06/15/2017 0806 Last data filed at 06/15/2017 0640 Gross per 24 hour  Intake 3670 ml  Output 8400 ml  Net -4730 ml   Filed Weights   06/14/17 0015 06/15/17 0028 06/15/17 0500  Weight: 70 kg (154 lb 5.2 oz) 68.9 kg (152 lb) 68.9 kg (152 lb)    Exam: General exam: In no acute distress. Respiratory system: Good air movement and clear to auscultation. Cardiovascular system: S1 & S2 heard, RRR.  Gastrointestinal system: Abdomen is nondistended, soft and nontender.  Central nervous system: Alert and oriented. No focal neurological deficits. Extremities: No pedal edema. Skin: No rashes, lesions or ulcers Psychiatry: Judgement and insight appear normal. Mood & affect appropriate.    Data Reviewed:    Labs: Basic Metabolic Panel: Recent Labs  Lab 06/08/17 1041 06/13/17 1351 06/14/17 0908  NA 137 129* 134*  K 4.0 3.4* 3.4*  CL  --  96* 102  CO2 23 26 25   GLUCOSE 122 161* 147*  BUN 22.2 22* 19  CREATININE 1.4* 1.36* 1.34*  CALCIUM 9.4 9.0 9.2   GFR Estimated Creatinine Clearance: 47.8 mL/min (A) (by C-G formula based on SCr of 1.34 mg/dL (H)). Liver Function Tests: Recent Labs  Lab 06/08/17 1041  AST 22  ALT 21  ALKPHOS 109  BILITOT 0.37  PROT 6.5  ALBUMIN 2.8*   No results for input(s): LIPASE, AMYLASE in the last 168 hours. No results for input(s): AMMONIA in the last 168 hours. Coagulation profile No results for input(s): INR, PROTIME in the last 168 hours.  CBC: Recent Labs  Lab 06/08/17 1041 06/13/17 1351 06/14/17 0908 06/15/17 0602  WBC 4.3 9.9 9.2 7.3  NEUTROABS 3.1  --   --   --   HGB 7.4* 6.7* 8.3* 8.1*  HCT 23.1* 19.6* 23.9* 23.6*  MCV 96.3 92.5 89.2 90.1  PLT 90* 40* 31* 31*   Cardiac Enzymes: No results for input(s): CKTOTAL, CKMB, CKMBINDEX, TROPONINI in the last 168 hours. BNP (last 3 results) No results for input(s): PROBNP in the last 8760 hours. CBG: No results  for input(s): GLUCAP in the last 168 hours. D-Dimer: No results for input(s): DDIMER in the last 72 hours. Hgb A1c: No results for input(s): HGBA1C in the last 72 hours. Lipid Profile: No results for input(s): CHOL, HDL, LDLCALC, TRIG, CHOLHDL, LDLDIRECT in the last 72 hours. Thyroid function studies: No results for input(s): TSH, T4TOTAL, T3FREE, THYROIDAB in the last 72 hours.  Invalid input(s): FREET3 Anemia work up: No results for input(s): VITAMINB12, FOLATE, FERRITIN, TIBC, IRON, RETICCTPCT in the last 72 hours. Sepsis Labs: Recent Labs  Lab 06/08/17 1041 06/13/17 1351 06/14/17 0908 06/15/17 0602  WBC 4.3 9.9 9.2 7.3   Microbiology Recent Results (from the past 240 hour(s))  Urine C&S     Status: Abnormal   Collection Time: 06/13/17  1:40 PM  Result Value Ref Range  Status   Specimen Description URINE, CLEAN CATCH  Final   Special Requests NONE  Final   Culture (A)  Final    <10,000 COLONIES/mL INSIGNIFICANT GROWTH Performed at [facility], [address], [place], [place] [address]    Report Status 06/14/2017 FINAL  Final     Medications:   . atorvastatin  40 mg Oral Daily  . cholecalciferol  1,000 Units Oral Daily  . famotidine  20 mg Oral BID  . levothyroxine  100 mcg Oral QAC breakfast  . metoprolol tartrate  12.5 mg Oral BID  . polycarbophil  625 mg Oral BID  . sodium chloride flush  3 mL Intravenous Q12H   Continuous Infusions: . sodium chloride        LOS: 2 days   [facility] Pager [phone]  *Please refer to [email or web address], password [email or web address] to get updated schedule on who will round on this patient, as hospitalists switch teams weekly. If 7PM-7AM, please contact night-coverage at [email or web address], password [email or web address] for any overnight needs.  06/15/2017, 8:06 AM

## 2017-06-16 ENCOUNTER — Inpatient Hospital Stay (HOSPITAL_COMMUNITY): Payer: Medicare Other

## 2017-06-16 ENCOUNTER — Encounter (HOSPITAL_COMMUNITY): Payer: Self-pay | Admitting: Interventional Radiology

## 2017-06-16 ENCOUNTER — Ambulatory Visit (HOSPITAL_COMMUNITY): Payer: Medicare Other

## 2017-06-16 DIAGNOSIS — I2699 Other pulmonary embolism without acute cor pulmonale: Secondary | ICD-10-CM

## 2017-06-16 DIAGNOSIS — E440 Moderate protein-calorie malnutrition: Secondary | ICD-10-CM

## 2017-06-16 HISTORY — PX: IR IVC FILTER PLMT / S&I /IMG GUID/MOD SED: IMG701

## 2017-06-16 LAB — CBC
HEMATOCRIT: 22.2 % — AB (ref 39.0–52.0)
HEMATOCRIT: 24.5 % — AB (ref 39.0–52.0)
HEMOGLOBIN: 7.6 g/dL — AB (ref 13.0–17.0)
HEMOGLOBIN: 8.4 g/dL — AB (ref 13.0–17.0)
MCH: 31 pg (ref 26.0–34.0)
MCH: 30.8 pg (ref 26.0–34.0)
MCHC: 34.2 g/dL (ref 30.0–36.0)
MCHC: 34.3 g/dL (ref 30.0–36.0)
MCV: 90.6 fL (ref 78.0–100.0)
MCV: 89.7 fL (ref 78.0–100.0)
Platelets: 33 10*3/uL — ABNORMAL LOW (ref 150–400)
Platelets: 33 10*3/uL — ABNORMAL LOW (ref 150–400)
RBC: 2.45 MIL/uL — AB (ref 4.22–5.81)
RBC: 2.73 MIL/uL — AB (ref 4.22–5.81)
RDW: 16.2 % — AB (ref 11.5–15.5)
RDW: 16.2 % — ABNORMAL HIGH (ref 11.5–15.5)
WBC: 6 10*3/uL (ref 4.0–10.5)
WBC: 5.5 10*3/uL (ref 4.0–10.5)

## 2017-06-16 LAB — PROTIME-INR
INR: 1.11
PROTHROMBIN TIME: 14.2 s (ref 11.4–15.2)

## 2017-06-16 LAB — PREPARE RBC (CROSSMATCH)

## 2017-06-16 MED ORDER — MIDAZOLAM HCL 2 MG/2ML IJ SOLN
INTRAMUSCULAR | Status: AC | PRN
  Administered 2017-06-16 (×2): 1 mg via INTRAVENOUS

## 2017-06-16 MED ORDER — IOPAMIDOL (ISOVUE-300) INJECTION 61%
INTRAVENOUS | Status: AC
  Administered 2017-06-16: 30 mL
  Filled 2017-06-16: qty 50

## 2017-06-16 MED ORDER — LIDOCAINE HCL 1 % IJ SOLN
INTRAMUSCULAR | Status: AC | PRN
  Administered 2017-06-16: 10 mL

## 2017-06-16 MED ORDER — SODIUM CHLORIDE 0.9% FLUSH: 10.0000 mL | INTRAVENOUS | Status: DC | PRN

## 2017-06-16 MED ORDER — FENTANYL CITRATE (PF) 100 MCG/2ML IJ SOLN
INTRAMUSCULAR | Status: AC
Start: 2017-06-16 — End: 2017-06-17
  Filled 2017-06-16: qty 2

## 2017-06-16 MED ORDER — FENTANYL CITRATE (PF) 100 MCG/2ML IJ SOLN
INTRAMUSCULAR | Status: AC | PRN
  Administered 2017-06-16 (×2): 50 ug via INTRAVENOUS

## 2017-06-16 MED ORDER — LIDOCAINE HCL 1 % IJ SOLN
INTRAMUSCULAR | Status: AC | PRN
  Administered 2017-06-16: 20 mL

## 2017-06-16 MED ORDER — MIDAZOLAM HCL 2 MG/2ML IJ SOLN
INTRAMUSCULAR | Status: AC
  Filled 2017-06-16: qty 2

## 2017-06-16 MED ORDER — SODIUM CHLORIDE 0.9 % IV SOLN: Freq: Once | INTRAVENOUS | Status: DC

## 2017-06-16 MED ORDER — IOPAMIDOL (ISOVUE-300) INJECTION 61%
INTRAVENOUS | Status: AC
  Filled 2017-06-16: qty 50

## 2017-06-16 MED ORDER — LIDOCAINE HCL 1 % IJ SOLN
INTRAMUSCULAR | Status: AC
  Filled 2017-06-16: qty 20

## 2017-06-16 NOTE — Care Management Important Message (Addendum)
Important Message  Patient Details IM Letter given to Nora/Case Management to present to the Patient  Name: [name] [name] MRN: [id number] Date of Birth: 03/24/1946   Medicare Important Message Given:  Yes    Kerin Salen 06/16/2017, 10:13 AMImportant Message  Patient Details  Name: [name] [name] MRN: [id number] Date of Birth: 18-Aug-1945   Medicare Important Message Given:  Yes    Kerin Salen 06/16/2017, 10:12 AM

## 2017-06-16 NOTE — Progress Notes (Signed)
Events noted in the last few days.  Hematuria have decreased of Xarelto and with bladder irrigation. Hemoglobin remains low despite transfusion.  I fear that the restarting full dose anticoagulation will exacerbate his bleeding which is related to his tumor.  I recommend proceeding with IVC filter and stopping anticoagulation permanently.    After placing his IVC filter, I recommend another transfusion of packed red cells to get his hemoglobin close to 9 and discharge after that.

## 2017-06-16 NOTE — Progress Notes (Signed)
[facility] PROGRESS NOTE    Progress Note  [name] [name]  IOX:[id number] DOB: 1946/03/20 DOA: 06/13/2017 PCP: Tonia Ghent, MD     Brief Narrative:   [name] [name] is an 71 y.o. male past medical history of stage IV metastatic urothelial cancer with left renal mass and pulmonary metastases diagnosed in November 2016 has been on chemo and immunotherapy followed by Dr. [name] CT scan showed tumor response to therapy and the bilateral pulmonary embolism, since then he has been on Xarelto.  Came in to the ED for 1 week of hematuria which today turned into clots and proceeded to not being able to void, Foley catheter was placed in the ED, his hemoglobin on admission was 6.70  Assessment/Plan:   Gross hematuria/ Renal mass, left/bladder outlet obstruction/  Acute blood loss anemia: Urine is clear.  Urology. Renal ultrasound small amount of echogenic debris largely decompressing the bladder, with a large renal mass consistent with malignancy. There is a mild drop in hemoglobin, now 7.6, will transfuse 1 unit of packed red blood cells recheck CBC in the morning.  Symptomatic anemia Hemoglobin slowly down now 7.6 we will transfuse an additional unit of packed red blood cell check a CBC in the morning.  Hx of bilateral pulmonary embolism: Holding Xarelto until urine clears and hemoglobin stabilizes.  Essential hypertension BP stable on no anithypertensive medications  Chronic ATRIAL FIBRILLATION Rate control hold xarelto   DVT prophylaxis: SCD Family Communication:none Disposition Plan/Barrier to D/C: Hopefully in 1  days. Code Status:     Code Status Orders  (From admission, onward)        Start     Ordered   06/14/17 0016  Full code  Continuous     06/14/17 0015    Code Status History    Date Active Date Inactive Code Status Order ID Comments User Context   06/17/2016 19:20 06/19/2016 15:54 Full Code [id number]  Elwin Mocha, MD Inpatient    Advance  Directive Documentation     Most Recent Value  Type of Advance Directive  Healthcare Power of Attorney  Pre-existing out of facility DNR order (yellow form or pink MOST form)  No data  "MOST" Form in Place?  No data        IV Access:    Peripheral IV   Procedures and diagnostic studies:   US Renal  Result Date: 06/14/2017 CLINICAL DATA:  Hematuria.  History of metastatic renal cell cancer. EXAM: RENAL / URINARY TRACT ULTRASOUND COMPLETE COMPARISON:  CT abdomen pelvis dated June 03, 2017. FINDINGS: Right Kidney: Length: 10.5 cm. Echogenicity within normal limits. No mass or hydronephrosis visualized. Left Kidney: Length: 12.2 cm. Large, heterogeneous mass involving the upper and midpole, measuring 7.7 x 7.1 x 5.2 cm, grossly unchanged when allowing for differences in technique. There is echogenic material within the collecting system. Bladder: The bladder is largely decompressed by a Foley catheter, with a small amount of echogenic debris seen surrounding the Foley balloon and within the lumen of the Foley catheter. IMPRESSION: 1. Small amount of echogenic debris within the largely decompressed bladder, with additional echogenic debris seen in the lumen of the Foley catheter, likely representing blood products. 2. Large left renal mass, consistent with known renal cell carcinoma, grossly unchanged when compared to recent CT. Echogenic debris within the left renal collecting system may represent tumor and/or blood products. Electronically Signed   By: Titus Dubin M.D.   On: 06/14/2017 09:59     Medical  Consultants:    None.  Anti-Infectives:   None  Subjective:    Lucianne Lei no complains  Objective:    Vitals:   06/15/17 0500 06/15/17 1323 06/15/17 2200 06/16/17 0556  BP:  119/65 120/74 114/68  Pulse:  72 69 75  Resp:  16 20 20   Temp:  97.6 F (36.4 C) 97.9 F (36.6 C) 98.3 F (36.8 C)  TempSrc:  Oral Oral Oral  SpO2:  100% 100% 100%  Weight: 68.9 kg  (152 lb)   67.6 kg (149 lb 0.5 oz)  Height:        Intake/Output Summary (Last 24 hours) at 06/16/2017 0808 Last data filed at 06/16/2017 0558 Gross per 24 hour  Intake 480 ml  Output 1750 ml  Net -1270 ml   Filed Weights   06/15/17 0028 06/15/17 0500 06/16/17 0556  Weight: 68.9 kg (152 lb) 68.9 kg (152 lb) 67.6 kg (149 lb 0.5 oz)    Exam: General exam: In no acute distress. Respiratory system: Good air movement and clear to auscultation. Cardiovascular system: S1 & S2 heard, RRR.  Gastrointestinal system: Abdomen is nondistended, soft and nontender.  Central nervous system: Alert and oriented. No focal neurological deficits. Extremities: No pedal edema. Skin: No rashes, lesions or ulcers Psychiatry: Judgement and insight appear normal. Mood & affect appropriate.    Data Reviewed:    Labs: Basic Metabolic Panel: Recent Labs  Lab 06/13/17 1351 06/14/17 0908  NA 129* 134*  K 3.4* 3.4*  CL 96* 102  CO2 26 25  GLUCOSE 161* 147*  BUN 22* 19  CREATININE 1.36* 1.34*  CALCIUM 9.0 9.2   GFR Estimated Creatinine Clearance: 46.9 mL/min (A) (by C-G formula based on SCr of 1.34 mg/dL (H)). Liver Function Tests: No results for input(s): AST, ALT, ALKPHOS, BILITOT, PROT, ALBUMIN in the last 168 hours. No results for input(s): LIPASE, AMYLASE in the last 168 hours. No results for input(s): AMMONIA in the last 168 hours. Coagulation profile No results for input(s): INR, PROTIME in the last 168 hours.  CBC: Recent Labs  Lab 06/13/17 1351 06/14/17 0908 06/15/17 0602 06/16/17 0555  WBC 9.9 9.2 7.3 6.0  HGB 6.7* 8.3* 8.1* 7.6*  HCT 19.6* 23.9* 23.6* 22.2*  MCV 92.5 89.2 90.1 90.6  PLT 40* 31* 31* 33*   Cardiac Enzymes: No results for input(s): CKTOTAL, CKMB, CKMBINDEX, TROPONINI in the last 168 hours. BNP (last 3 results) No results for input(s): PROBNP in the last 8760 hours. CBG: No results for input(s): GLUCAP in the last 168 hours. D-Dimer: No results for  input(s): DDIMER in the last 72 hours. Hgb A1c: No results for input(s): HGBA1C in the last 72 hours. Lipid Profile: No results for input(s): CHOL, HDL, LDLCALC, TRIG, CHOLHDL, LDLDIRECT in the last 72 hours. Thyroid function studies: No results for input(s): TSH, T4TOTAL, T3FREE, THYROIDAB in the last 72 hours.  Invalid input(s): FREET3 Anemia work up: No results for input(s): VITAMINB12, FOLATE, FERRITIN, TIBC, IRON, RETICCTPCT in the last 72 hours. Sepsis Labs: Recent Labs  Lab 06/13/17 1351 06/14/17 0908 06/15/17 0602 06/16/17 0555  WBC 9.9 9.2 7.3 6.0   Microbiology Recent Results (from the past 240 hour(s))  Urine C&S     Status: Abnormal   Collection Time: 06/13/17  1:40 PM  Result Value Ref Range Status   Specimen Description URINE, CLEAN CATCH  Final   Special Requests NONE  Final   Culture (A)  Final    <10,000 COLONIES/mL INSIGNIFICANT GROWTH Performed  at [facility], [place] [address] [place], [place] [address]    Report Status 06/14/2017 FINAL  Final  MRSA PCR Screening     Status: Abnormal   Collection Time: 06/15/17  8:30 AM  Result Value Ref Range Status   MRSA by PCR POSITIVE (A) NEGATIVE Final    Comment:        The GeneXpert MRSA Assay (FDA approved for NASAL specimens only), is one component of a comprehensive MRSA colonization surveillance program. It is not intended to diagnose MRSA infection nor to guide or monitor treatment for MRSA infections. RESULT CALLED TO, READ BACK BY AND VERIFIED WITH: M.VAUGHN AT 1449 ON 06/15/17 BY N.[email or web address]      Medications:   . atorvastatin  40 mg Oral Daily  . Chlorhexidine Gluconate Cloth  6 each Topical Q0600  . cholecalciferol  1,000 Units Oral Daily  . famotidine  20 mg Oral BID  . levothyroxine  100 mcg Oral QAC breakfast  . metoprolol tartrate  12.5 mg Oral BID  . mupirocin ointment  1 application Nasal BID  . polycarbophil  625 mg Oral BID  . sodium chloride flush  3 mL Intravenous Q12H    Continuous Infusions: . sodium chloride    . sodium chloride        LOS: 3 days   Charlynne Cousins  [facility] Pager [phone]  *Please refer to [email or web address], password [email or web address] to get updated schedule on who will round on this patient, as hospitalists switch teams weekly. If 7PM-7AM, please contact night-coverage at [email or web address], password [email or web address] for any overnight needs.  06/16/2017, 8:08 AM

## 2017-06-16 NOTE — Progress Notes (Signed)
Referring Physician(s): Ortiz,A/Shadad,F  Supervising Physician: Sandi Mariscal  Patient Status:  [facility] - In-pt  Chief Complaint:  Hematuria, urothelial cancer  Subjective: Patient familiar to IR service from prior left renal mass biopsy and right chest wall Port-A-Cath placement 2016 as well as left renal arteriogram on 07/20/16 to assess for possible embolization secondary to persistent gross hematuria.  He has a history of metastatic urothelial cancer of the left kidney as well as bilateral PE, on xarelto as outpatient.  He was recently admitted to [facility] with gross hematuria secondary to known left renal tumor.  Lower extremity venous Doppler study today revealed bilateral DVTs.  Request now received for IVC filter placement.  He currently denies fever, headache, chest pain, cough, abdominal/back pain, nausea, vomiting.  He does have some dyspnea.  Past Medical History:  Diagnosis Date  . Atrial fibrillation (HCC)    Breif, post-op CABG  . bladder ca dx'd 03/2015  . CAD (coronary artery disease)    Exertional chest pain prompted LHC 10/11 showing EF 55%, mild inferior hypokinesis, 90% prox RCA, 70% mid RCA, 80% distal RCA, 70% ostial PDA, 70% mPLV, 90% mid OM1 (large), 90-95% prox LAD. Pt had CABG with LIMA-LAD, SVG-OMG1, seq SVG-PDA/PLV  . COPD (chronic obstructive pulmonary disease) ([place])    Quit smoking 10/11  . ED (erectile dysfunction) of organic origin   . GERD (gastroesophageal reflux disease)   . Hypertension    Past Surgical History:  Procedure Laterality Date  . CORONARY ARTERY BYPASS GRAFT     LIMA-LAD, SVG-OM1, seq SVG-PDA/PLV  . CYSTOSCOPY WITH RETROGRADE PYELOGRAM, URETEROSCOPY AND STENT PLACEMENT Left 04/08/2015   Procedure: CYSTOSCOPY WITH LEFT  RETROGRADE PYELOGRAM, BALLOON DILATION LEFT URETER, LEFT URETEROSCOPY,LEFT STENT PLACEMENT;  Surgeon: Nickie Retort, MD;  Location: [facility] ORS;  Service: Urology;  Laterality: Left;  . IR GENERIC HISTORICAL   07/06/2016   IR RADIOLOGIST EVAL & MGMT 07/06/2016 Aletta Edouard, MD [facility] INTERV RAD  . IR GENERIC HISTORICAL  07/20/2016   IR US GUIDE VASC ACCESS RIGHT 07/20/2016 Aletta Edouard, MD [facility]INTER[facility] RAD  . IR GENERIC HISTORICAL  07/20/2016   IR RENAL SELECTIVE  UNI INC S&I MOD SED 07/20/2016 Aletta Edouard, MD [facility]-INTERV RAD     Allergies: Hydrocodone  Medications: Prior to Admission medications   Medication Sig Start Date End Date Taking? Authorizing Provider  acetaminophen (TYLENOL) 325 MG tablet Take 650 mg by mouth every 6 (six) hours as needed for moderate pain or headache.    Yes [provider]  atorvastatin (LIPITOR) 40 MG tablet Take 1 tablet (40 mg total) daily by mouth. 05/13/17  Yes End, Harrell Gave, MD  Cholecalciferol (VITAMIN D) 1000 UNITS capsule Take 1,000 Units by mouth daily.     Yes [provider]  diphenhydrAMINE (BENADRYL) 25 MG tablet Take 25 mg by mouth every 6 (six) hours as needed for sleep.    Yes [provider]  docusate sodium (COLACE) 100 MG capsule Take 100 mg by mouth 2 (two) times daily as needed (constipation).   Yes [provider]  levothyroxine (SYNTHROID, LEVOTHROID) 100 MCG tablet Take 100 mcg daily before breakfast by mouth.   Yes [provider]  metoprolol tartrate (LOPRESSOR) 25 MG tablet Take 0.5 tablets (12.5 mg total) 2 (two) times daily by mouth. 05/13/17  Yes End, Harrell Gave, MD  polycarbophil (FIBERCON) 625 MG tablet Take 625 mg by mouth 2 (two) times daily.    Yes [provider]  prochlorperazine (COMPAZINE) 10 MG  tablet Take 1 tablet (10 mg total) by mouth every 6 (six) hours as needed for nausea or vomiting. 06/03/17  Yes Wyatt Portela, MD  ranitidine (ZANTAC) 150 MG tablet Take 150 mg by mouth 2 (two) times daily.   Yes [provider]  Rivaroxaban 15 & 20 MG TBPK Take as directed on package: Start with one 15mg  tablet by mouth twice a day with food. On Day 22, switch to one 20mg   tablet once a day with food. 06/03/17  Yes Wyatt Portela, MD  aspirin 81 MG chewable tablet Chew 81 mg daily by mouth.    [provider]  benzonatate (TESSALON) 200 MG capsule TAKE ONE CAPSULE BY MOUTH 3 TIMES A DAY AS NEEDED FOR COUGH Patient not taking: Reported on 06/13/2017 03/08/17   Wyatt Portela, MD  benzonatate (TESSALON) 200 MG capsule Take 1 capsule (200 mg total) by mouth 3 (three) times daily as needed for cough. Patient not taking: Reported on 06/13/2017 03/24/17   Wyatt Portela, MD  famotidine (PEPCID) 20 MG tablet Take 1 tablet (20 mg total) by mouth daily. Patient not taking: Reported on 06/13/2017 06/19/16   Eugenie Filler, MD  lidocaine-prilocaine (EMLA) cream Apply to port-a-cath 1-2 hours prior to acces. Cover with saran wrap. 05/16/15   Wyatt Portela, MD  nitroGLYCERIN (NITROSTAT) 0.4 MG SL tablet Place 1 tablet (0.4 mg total) every 5 (five) minutes as needed under the tongue. May repeat up to 3 doses. 05/13/17   End, Harrell Gave, MD  PRESCRIPTION MEDICATION Inject 160 mg into the muscle daily. Gentamycin given for 3 days at [facility]    [provider]  sildenafil (VIAGRA) 50 MG tablet Take 50 mg by mouth as needed for erectile dysfunction.     [provider]  traMADol (ULTRAM) 50 MG tablet Take 1 tablet (50 mg total) by mouth every 6 (six) hours as needed. Patient taking differently: Take 50 mg by mouth every 6 (six) hours as needed for moderate pain or severe pain.  04/04/17   Wyatt Portela, MD     Vital Signs: BP 114/67   Pulse 71   Temp (!) 97.5 F (36.4 C) (Oral)   Resp 18   Ht 5\' 10"  (1.778 m)   Wt 149 lb 0.5 oz (67.6 kg)   SpO2 100%   BMI 21.38 kg/m   Physical Exam awake, alert.  Chest clear to auscultation bilaterally.  Clean, intact right chest wall Port-A-Cath.  Heart with regular rate and rhythm.  Abdomen soft, positive bowel sounds, nontender.  No lower extremity edema.  Imaging: US Renal  Result Date:  06/14/2017 CLINICAL DATA:  Hematuria.  History of metastatic renal cell cancer. EXAM: RENAL / URINARY TRACT ULTRASOUND COMPLETE COMPARISON:  CT abdomen pelvis dated June 03, 2017. FINDINGS: Right Kidney: Length: 10.5 cm. Echogenicity within normal limits. No mass or hydronephrosis visualized. Left Kidney: Length: 12.2 cm. Large, heterogeneous mass involving the upper and midpole, measuring 7.7 x 7.1 x 5.2 cm, grossly unchanged when allowing for differences in technique. There is echogenic material within the collecting system. Bladder: The bladder is largely decompressed by a Foley catheter, with a small amount of echogenic debris seen surrounding the Foley balloon and within the lumen of the Foley catheter. IMPRESSION: 1. Small amount of echogenic debris within the largely decompressed bladder, with additional echogenic debris seen in the lumen of the Foley catheter, likely representing blood products. 2. Large left renal mass, consistent with known renal cell  carcinoma, grossly unchanged when compared to recent CT. Echogenic debris within the left renal collecting system may represent tumor and/or blood products. Electronically Signed   By: Titus Dubin M.D.   On: 06/14/2017 09:59    Labs:  CBC: Recent Labs    06/13/17 1351 06/14/17 0908 06/15/17 0602 06/16/17 0555  WBC 9.9 9.2 7.3 6.0  HGB 6.7* 8.3* 8.1* 7.6*  HCT 19.6* 23.9* 23.6* 22.2*  PLT 40* 31* 31* 33*    COAGS: Recent Labs    06/18/16 0540 07/20/16 0843  INR 1.04 1.02  APTT  --  31    BMP: Recent Labs    06/19/16 0537  07/20/16 0843  06/01/17 0832 06/08/17 1041 06/13/17 1351 06/14/17 0908  NA 138   < > 136   < > 138 137 129* 134*  K 3.9   < > 4.1   < > 3.6 4.0 3.4* 3.4*  CL 104  --  103  --   --   --  96* 102  CO2 28   < > 27   < > 24 23 26 25   GLUCOSE 98   < > 108*   < > 165* 122 161* 147*  BUN 20   < > 24*   < > 17.5 22.2 22* 19  CALCIUM 8.8*   < > 8.8*   < > 8.6 9.4 9.0 9.2  CREATININE 1.40*   < > 1.54*    < > 1.4* 1.4* 1.36* 1.34*  GFRNONAA 49*  --  43*  --   --   --  50* 51*  GFRAA 56*  --  50*  --   --   --  58* 59*   < > = values in this interval not displayed.    LIVER FUNCTION TESTS: Recent Labs    05/18/17 1221 05/20/17 1137 06/01/17 0832 06/08/17 1041  BILITOT 0.33 0.54 0.45 0.37  AST 32 86* 13 22  ALT 30 84* 15 21  ALKPHOS 96 98 102 109  PROT 6.5 6.5 6.2* 6.5  ALBUMIN 2.7* 2.7* 2.7* 2.8*    Assessment and Plan: Pt with history of metastatic urothelial cancer of the left kidney as well as bilateral PE, on xarelto as outpatient.  He was recently admitted to [facility] with gross hematuria secondary to known left renal tumor.  Lower extremity venous Doppler study today revealed bilateral DVTs.  Request now received for IVC filter placement. Case reviewed by Dr. Pascal Lux. Risks and benefits discussed with the patient/spouse including, but not limited to bleeding, infection, contrast induced renal failure, filter fracture or migration which can lead to emergency surgery or even death, strut penetration with damage or irritation to adjacent structures and caval thrombosis. All of the patient's questions were answered, patient is agreeable to proceed. Consent signed and in chart.  Current labs include WBC 6.0, hemoglobin 7.6-receiving  transfusion, platelets 33k, creatinine 1.34-may necessitate use of CO2 for procedure.  Case scheduled for this afternoon.     Electronically Signed: D. Rowe Robert, PA-C 06/16/2017, 2:34 PM   I spent a total of 25 minutes at the the patient's bedside AND on the patient's hospital floor or unit, greater than 50% of which was counseling/coordinating care for IVC filter placement    Patient ID: [name] [name], male   DOB: [birth year], 71 y.o.   MRN: [id number]

## 2017-06-16 NOTE — Consult Note (Signed)
Urology Consult Note      Subjective: [name] [name].  Urine light pink off CBI  Objective: Vital signs in last 24 hours: Temp:  [97.6 F (36.4 C)-98.3 F (36.8 C)] 98.3 F (36.8 C) (12/20 0556) Pulse Rate:  [69-75] 75 (12/20 0556) Resp:  [16-20] 20 (12/20 0556) BP: (114-120)/(65-74) 114/68 (12/20 0556) SpO2:  [100 %] 100 % (12/20 0556) Weight:  [67.6 kg (149 lb 0.5 oz)] 67.6 kg (149 lb 0.5 oz) (12/20 0556)  Intake/Output from previous day: 12/19 0701 - 12/20 0700 In: 480 [P.O.:480] Out: 1750 [Urine:1750] Intake/Output this shift: No intake/output data recorded.  Physical Exam:  General: Alert and oriented CV: RRR Lungs: Clear Abdomen: Soft, non tender GU: Foley in place, urine light pink   Ext: NT, No erythema  Lab Results: Recent Labs    06/14/17 0908 06/15/17 0602 06/16/17 0555  HGB 8.3* 8.1* 7.6*  HCT 23.9* 23.6* 22.2*   BMET Recent Labs    06/13/17 1351 06/14/17 0908  NA 129* 134*  K 3.4* 3.4*  CL 96* 102  CO2 26 25  GLUCOSE 161* 147*  BUN 22* 19  CREATININE 1.36* 1.34*  CALCIUM 9.0 9.2     Studies/Results: US Renal  Result Date: 06/14/2017 CLINICAL DATA:  Hematuria.  History of metastatic renal cell cancer. EXAM: RENAL / URINARY TRACT ULTRASOUND COMPLETE COMPARISON:  CT abdomen pelvis dated June 03, 2017. FINDINGS: Right Kidney: Length: 10.5 cm. Echogenicity within normal limits. No mass or hydronephrosis visualized. Left Kidney: Length: 12.2 cm. Large, heterogeneous mass involving the upper and midpole, measuring 7.7 x 7.1 x 5.2 cm, grossly unchanged when allowing for differences in technique. There is echogenic material within the collecting system. Bladder: The bladder is largely decompressed by a Foley catheter, with a small amount of echogenic debris seen surrounding the Foley balloon and within the lumen of the Foley catheter. IMPRESSION: 1. Small amount of echogenic debris within the largely decompressed bladder, with additional echogenic debris  seen in the lumen of the Foley catheter, likely representing blood products. 2. Large left renal mass, consistent with known renal cell carcinoma, grossly unchanged when compared to recent CT. Echogenic debris within the left renal collecting system may represent tumor and/or blood products. Electronically Signed   By: Titus Dubin M.D.   On: 06/14/2017 09:59    Assessment/Plan:  71 y.o. male with mUCC, admitted with clot retention and gross hematuria.  Overall doing well, urine light ipnk on minimal CBI.   - Successful capping trial, keep CBI disconnected  - TOV in office in 7 days  - xarelto vs IVC filter per primary team  - Please contact urology for further issues or concerns   Discussed with Dr. [name]    LOS: 3 days   Alla Feeling, MD 06/16/2017, 8:00 AM

## 2017-06-16 NOTE — Progress Notes (Signed)
Bilateral lower extremity venous duplex has been completed. There is evidence of acute deep vein thrombosis involving the distal femoral, popliteal, posterior tibial, and peroneal veins of the right lower extremity. There is also evidence of acute deep vein thrombosis involving the distal femoral, popliteal, intramuscular gastrocnemius, and peroneal veins of the left lower extremity. Results were given to the patient's nurse, Josph Macho.  06/16/17 1:34 PM [name] [name] RVT

## 2017-06-16 NOTE — Procedures (Signed)
Pre procedural Dx: DVT and PE with contraindication to anti-coagulation. Post Procedural Dx: Same  Successful placement of an infrarenal IVC filter via the R CFV.  Keep right leg straight for 2 hrs (until 1900).  EBL: Trace  Complications: None immediate  Ronny Bacon, MD Pager #: [phone]

## 2017-06-17 ENCOUNTER — Encounter (HOSPITAL_COMMUNITY): Payer: Self-pay | Admitting: Radiology

## 2017-06-17 DIAGNOSIS — I10 Essential (primary) hypertension: Secondary | ICD-10-CM

## 2017-06-17 DIAGNOSIS — I82409 Acute embolism and thrombosis of unspecified deep veins of unspecified lower extremity: Secondary | ICD-10-CM

## 2017-06-17 LAB — CBC
HCT: 25.3 % — ABNORMAL LOW (ref 39.0–52.0)
HEMOGLOBIN: 8.5 g/dL — AB (ref 13.0–17.0)
MCH: 30.1 pg (ref 26.0–34.0)
MCHC: 33.6 g/dL (ref 30.0–36.0)
MCV: 89.7 fL (ref 78.0–100.0)
Platelets: 35 10*3/uL — ABNORMAL LOW (ref 150–400)
RBC: 2.82 MIL/uL — AB (ref 4.22–5.81)
RDW: 16.5 % — ABNORMAL HIGH (ref 11.5–15.5)
WBC: 5.3 10*3/uL (ref 4.0–10.5)

## 2017-06-17 LAB — BPAM RBC
BLOOD PRODUCT EXPIRATION DATE: 201901162359
Blood Product Expiration Date: 201901162359
Blood Product Expiration Date: 201901242359
ISSUE DATE / TIME: 201812172052
ISSUE DATE / TIME: 201812201145
ISSUE DATE / TIME: 201812180025
UNIT TYPE AND RH: 7300
Unit Type and Rh: 7300
Unit Type and Rh: 7300

## 2017-06-17 LAB — TYPE AND SCREEN
ABO/RH(D): B POS
ANTIBODY SCREEN: NEGATIVE
UNIT DIVISION: 0
UNIT DIVISION: 0
Unit division: 0

## 2017-06-17 MED ORDER — HEPARIN SOD (PORK) LOCK FLUSH 100 UNIT/ML IV SOLN
500.0000 [IU] | Freq: Once | INTRAVENOUS | Status: DC
  Filled 2017-06-17: qty 5

## 2017-06-17 MED ORDER — ASPIRIN 81 MG PO CHEW: 81.0000 mg | CHEWABLE_TABLET | Freq: Every day | ORAL | Status: AC

## 2017-06-17 NOTE — Progress Notes (Signed)
IP PROGRESS NOTE  Subjective:   Mr. [name] [name] doing better this morning without any major complaints.  IVC filter placed without complications overnight.  He still feels slightly weak and his hematuria is improving.  Objective:  Vital signs in last 24 hours: Temp:  [97.5 F (36.4 C)-98.2 F (36.8 C)] 98.2 F (36.8 C) (12/21 0535) Pulse Rate:  [65-85] 74 (12/21 0535) Resp:  [12-18] 16 (12/21 0535) BP: (112-135)/(65-74) 118/69 (12/21 0535) SpO2:  [97 %-100 %] 99 % (12/21 0535) Weight:  [149 lb 7.6 oz (67.8 kg)] 149 lb 7.6 oz (67.8 kg) (12/21 0535) Weight change: 7.1 oz (0.2 kg) Last BM Date: 06/14/17  Intake/Output from previous day: 12/20 0701 - 12/21 0700 In: 540 [P.O.:240; Blood:300] Out: 1225 [Urine:1225] Comfortable appearing gentleman without distress. Mouth: mucous membranes moist, pharynx normal without lesions Resp: clear to auscultation bilaterally Cardio: regular rate and rhythm, S1, S2 normal, no murmur, click, rub or gallop GI: soft, non-tender; bowel sounds normal; no masses,  no organomegaly Extremities: extremities normal, atraumatic, no cyanosis or edema   Lab Results: Recent Labs    06/16/17 1615 06/17/17 0540  WBC 5.5 5.3  HGB 8.4* 8.5*  HCT 24.5* 25.3*  PLT 33* 35*    BMET Recent Labs    06/14/17 0908  NA 134*  K 3.4*  CL 102  CO2 25  GLUCOSE 147*  BUN 19  CREATININE 1.34*  CALCIUM 9.2     Assessment/Plan:  71 year old gentleman with the following issues:  1. 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   He is currently receiving systemic chemotherapy that will be on hold until his counts recover and these issues resolved.  2.  Hematuria: Continues to be an issue unrelated to his urothelial neoplasm.  Urology is following and currently has a Foley catheter in place.   His urine continues to be dark although less bleeding is noted.  3.  Pulmonary embolism: Appears to be related to lower extremity DVT.  Risks and benefits of IVC filter placement has been discussed given his lower extremity DVT he would benefit from an IVC filter.  This was placed on 02/72/5366 without complications.  I recommend that holding off Xarelto at this time given his persistent hematuria and now IVC filter is in place.  4.  Anemia: Multifactorial in nature mostly related to blood loss and chemotherapy.  I agree with transfusion at this time keep his hemoglobin at least above 8.  5.  Thrombocytopenia: Related to chemotherapy and anticipate recovery over period of time.  6.  Disposition: I have no objections to discharge from an oncology standpoint.     LOS: 4 days   Zola Button 06/17/2017, 8:03 AM

## 2017-06-17 NOTE — Discharge Summary (Signed)
Physician Discharge Summary  [name] [name] EXB:[id number] DOB: [birth year] DOA: 06/13/2017  PCP: Tonia Ghent, MD  Admit date: 06/13/2017 Discharge date: 06/17/2017  Admitted From: home Disposition:  Home  Recommendations for Outpatient Follow-up:  1. Follow up with Urology in 1-2 weeks 2. Please obtain BMP/CBC in one week   Home Health:no Equipment/Devices:none  Discharge Condition:stable CODE STATUS:full Diet recommendation: Heart Healthy  Brief/Interim Summary: 71 y.o. male past medical history of stage IV metastatic urothelial cancer with left renal mass and pulmonary metastases diagnosed in November 2016 has been on chemo and immunotherapy followed by Dr. [name] CT scan showed tumor response to therapy and the bilateral pulmonary embolism, since then he has been on Xarelto.  Came in to the ED for 1 week of hematuria which today turned into clots and proceeded to not being able to void, Foley catheter was placed in the ED, his hemoglobin on admission was 6.70    Discharge Diagnoses:  Principal Problem:   Symptomatic anemia Active Problems:   Essential hypertension   Atherosclerosis of coronary artery bypass graft   ATRIAL FIBRILLATION   Gross hematuria   Renal mass, left   Urothelial cancer ([place])   Port catheter in place   Acute blood loss anemia   Malnutrition of moderate degree  Gross hematuria/ Renal mass, left/bladder outlet obstruction/  Acute blood loss anemia: Urology was consulted that recommended CBI Renal ultrasound small amount of echogenic debris largely decompressing the bladder, with a large renal mass consistent with malignancy. He was transfused 1 unit of packed red blood cells as his hemoglobin dropped to 7. Once his urine clear urology recommended to discharge home with Foley and they will follow-up as an outpatient.  Symptomatic anemia Likely due to gross hematuria will transfuse 1 unit of packed red blood cells.  Hx of bilateral  pulmonary embolism: On Admission Xarelto was held, lower extremity Doppler was+ for DVT. IR was consulted and IVC filter was placed this was discussed with Dr. [name] who agreed with plan.  Essential hypertension BP stable on no anithypertensive medications  Chronic ATRIAL FIBRILLATION Rate controlled we will go home off Xarelto.    Discharge Instructions  Discharge Instructions    Diet - low sodium heart healthy   Complete by:  As directed    Increase activity slowly   Complete by:  As directed      Allergies as of 06/17/2017      Reactions   Hydrocodone Other (See Comments)   Became too sedated      Medication List    STOP taking these medications   Rivaroxaban 15 & 20 MG Tbpk     TAKE these medications   acetaminophen 325 MG tablet Commonly known as:  TYLENOL Take 650 mg by mouth every 6 (six) hours as needed for moderate pain or headache.   aspirin 81 MG chewable tablet Chew 1 tablet (81 mg total) by mouth daily.   atorvastatin 40 MG tablet Commonly known as:  LIPITOR Take 1 tablet (40 mg total) daily by mouth.   benzonatate 200 MG capsule Commonly known as:  TESSALON TAKE ONE CAPSULE BY MOUTH 3 TIMES A DAY AS NEEDED FOR COUGH   benzonatate 200 MG capsule Commonly known as:  TESSALON Take 1 capsule (200 mg total) by mouth 3 (three) times daily as needed for cough.   diphenhydrAMINE 25 MG tablet Commonly known as:  BENADRYL Take 25 mg by mouth every 6 (six) hours as needed for sleep.   docusate  sodium 100 MG capsule Commonly known as:  COLACE Take 100 mg by mouth 2 (two) times daily as needed (constipation).   famotidine 20 MG tablet Commonly known as:  PEPCID Take 1 tablet (20 mg total) by mouth daily.   levothyroxine 100 MCG tablet Commonly known as:  SYNTHROID, LEVOTHROID Take 100 mcg daily before breakfast by mouth.   lidocaine-prilocaine cream Commonly known as:  EMLA Apply to port-a-cath 1-2 hours prior to acces. Cover with saran  wrap.   metoprolol tartrate 25 MG tablet Commonly known as:  LOPRESSOR Take 0.5 tablets (12.5 mg total) 2 (two) times daily by mouth.   nitroGLYCERIN 0.4 MG SL tablet Commonly known as:  NITROSTAT Place 1 tablet (0.4 mg total) every 5 (five) minutes as needed under the tongue. May repeat up to 3 doses.   polycarbophil 625 MG tablet Commonly known as:  FIBERCON Take 625 mg by mouth 2 (two) times daily.   PRESCRIPTION MEDICATION Inject 160 mg into the muscle daily. Gentamycin given for 3 days at [facility]   prochlorperazine 10 MG tablet Commonly known as:  COMPAZINE Take 1 tablet (10 mg total) by mouth every 6 (six) hours as needed for nausea or vomiting.   ranitidine 150 MG tablet Commonly known as:  ZANTAC Take 150 mg by mouth 2 (two) times daily.   sildenafil 50 MG tablet Commonly known as:  VIAGRA Take 50 mg by mouth as needed for erectile dysfunction.   traMADol 50 MG tablet Commonly known as:  ULTRAM Take 1 tablet (50 mg total) by mouth every 6 (six) hours as needed. What changed:  reasons to take this   Vitamin D 1000 units capsule Take 1,000 Units by mouth daily.      Follow-up Information    Wyatt Portela, MD Follow up.   Specialty:  Oncology Contact information: [place] [address] [phone]        Nickie Retort, MD Follow up.   Specialty:  Urology Contact information: [address] [place] [address] [phone]          Allergies  Allergen Reactions  . Hydrocodone Other (See Comments)    Became too sedated    Consultations:  Urology   Procedures/Studies: Ct Chest W Contrast  Result Date: 06/03/2017 CLINICAL DATA:  Followup metastatic renal cell carcinoma. EXAM: CT CHEST, ABDOMEN, AND PELVIS WITH CONTRAST TECHNIQUE: Multidetector CT imaging of the chest, abdomen and pelvis was performed following the standard protocol during bolus administration of intravenous contrast. CONTRAST:  189mL  ISOVUE-300 IOPAMIDOL (ISOVUE-300) INJECTION 61% COMPARISON:  03/31/2017 FINDINGS: CT CHEST FINDINGS Cardiovascular: Previous median sternotomy and CABG procedure. Aortic atherosclerosis. Normal heart size. Aortic atherosclerosis and calcification of the native coronary artery is noted. Abnormal filling defects are identified within bilateral lower lobar pulmonary arteries as well as lingular segmental and bilateral lower lobe segmental pulmonary arteries. Mediastinum/Nodes: The trachea appears patent and is midline. Normal appearance of the esophagus. No mediastinal or hilar adenopathy. No axillary or supraclavicular adenopathy. Lungs/Pleura: No pleural effusion identified. Left upper lobe pulmonary nodule measures 9 mm, image 29 of series 4. Previously 10 mm. Index right lower lobe pulmonary nodule measures 6 mm, image 98 of series 4. Previously 2.4 cm. No new or enlarging nodules. Musculoskeletal: Spondylosis identified within the thoracic spine. No aggressive lytic or sclerotic bone lesions. CT ABDOMEN PELVIS FINDINGS Hepatobiliary: Stable cyst within segment 7 of the liver measuring 2 cm. No suspicious liver abnormalities. The gallbladder appears within normal  limits. No biliary dilatation. Pancreas: Normal appearance of the pancreas. Spleen: Spleen is unremarkable. Adrenals/Urinary Tract: The adrenal glands appear normal. Normal appearance of the right kidney. Large mass involving the upper pole measures 7.3 x 7.0 cm, image 64 of series 2. Previously 7.9 x 8.4 cm. 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 Bladder appears normal. Stomach/Bowel: Stomach is unremarkable. The small bowel loops have a normal course and caliber. No the appendix is visualized and appears increased in diameter measuring 10 mm. No surrounding inflammatory changes. The appearance is similar to 03/31/2017. Normal appearance of the colon. Vascular/Lymphatic: Aortic atherosclerosis. No  aneurysm. Periaortic lymph node at the level of the left kidney measures 4.0 x 2.6 cm, image 67 of series 2. Previously 4.1 x 2.6 cm. The urinary bladder appears within normal limits. Reproductive: Prostate is unremarkable. Other: No abdominal wall hernia or abnormality. No abdominopelvic ascites. Musculoskeletal: Degenerative disc disease noted within the lumbar spine. IMPRESSION: 1. Bilateral lobar and segmental pulmonary artery filling defects compatible with pulmonary embolus. 2. The primary lesion within the left kidney is decreased in size from previous exam. The dominant nodule within the right lower lobe has also decreased in size in the interval. 3. No significant change and periaortic adenopathy. 4. 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 5.  Aortic Atherosclerosis (ICD10-I70.0). 6. Critical Value/emergent results were called by telephone at the time of interpretation on 06/03/2017 at 3:18 pm to Dr. Zola Button , who verbally acknowledged these results. Electronically Signed   By: Kerby Moors M.D.   On: 06/03/2017 15:19   Ct Abdomen Pelvis W Contrast  Result Date: 06/03/2017 CLINICAL DATA:  Followup metastatic renal cell carcinoma. EXAM: CT CHEST, ABDOMEN, AND PELVIS WITH CONTRAST TECHNIQUE: Multidetector CT imaging of the chest, abdomen and pelvis was performed following the standard protocol during bolus administration of intravenous contrast. CONTRAST:  147mL ISOVUE-300 IOPAMIDOL (ISOVUE-300) INJECTION 61% COMPARISON:  03/31/2017 FINDINGS: CT CHEST FINDINGS Cardiovascular: Previous median sternotomy and CABG procedure. Aortic atherosclerosis. Normal heart size. Aortic atherosclerosis and calcification of the native coronary artery is noted. Abnormal filling defects are identified within bilateral lower lobar pulmonary arteries as well as lingular segmental and bilateral lower lobe segmental pulmonary arteries. Mediastinum/Nodes: The trachea appears patent and is  midline. Normal appearance of the esophagus. No mediastinal or hilar adenopathy. No axillary or supraclavicular adenopathy. Lungs/Pleura: No pleural effusion identified. Left upper lobe pulmonary nodule measures 9 mm, image 29 of series 4. Previously 10 mm. Index right lower lobe pulmonary nodule measures 6 mm, image 98 of series 4. Previously 2.4 cm. No new or enlarging nodules. Musculoskeletal: Spondylosis identified within the thoracic spine. No aggressive lytic or sclerotic bone lesions. CT ABDOMEN PELVIS FINDINGS Hepatobiliary: Stable cyst within segment 7 of the liver measuring 2 cm. No suspicious liver abnormalities. The gallbladder appears within normal limits. No biliary dilatation. Pancreas: Normal appearance of the pancreas. Spleen: Spleen is unremarkable. Adrenals/Urinary Tract: The adrenal glands appear normal. Normal appearance of the right kidney. Large mass involving the upper pole measures 7.3 x 7.0 cm, image 64 of series 2. Previously 7.9 x 8.4 cm. 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 Bladder appears normal. Stomach/Bowel: Stomach is unremarkable. The small bowel loops have a normal course and caliber. No the appendix is visualized and appears increased in diameter measuring 10 mm. No surrounding inflammatory  changes. The appearance is similar to 03/31/2017. Normal appearance of the colon. Vascular/Lymphatic: Aortic atherosclerosis. No aneurysm. Periaortic lymph node at the level of the left kidney measures 4.0 x 2.6 cm, image 67 of series 2. Previously 4.1 x 2.6 cm. The urinary bladder appears within normal limits. Reproductive: Prostate is unremarkable. Other: No abdominal wall hernia or abnormality. No abdominopelvic ascites. Musculoskeletal: Degenerative disc disease noted within the lumbar spine. IMPRESSION: 1. Bilateral lobar and segmental pulmonary artery filling defects compatible with pulmonary embolus. 2. The primary lesion  within the left kidney is decreased in size from previous exam. The dominant nodule within the right lower lobe has also decreased in size in the interval. 3. No significant change and periaortic adenopathy. 4. 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 5.  Aortic Atherosclerosis (ICD10-I70.0). 6. Critical Value/emergent results were called by telephone at the time of interpretation on 06/03/2017 at 3:18 pm to Dr. Zola Button , who verbally acknowledged these results. Electronically Signed   By: Kerby Moors M.D.   On: 06/03/2017 15:19   Ir Ivc Filter Plmt / S&i /img Guid/mod Sed  Result Date: 06/16/2017 INDICATION: History of metastatic renal cell carcinoma, now with pulmonary embolism and lower extremity DVT. Patient suffered from chronic hematuria along with known metastatic disease and as such is a poor candidate for anticoagulation. As such, request made for placement of a the IVC filter for caval interruption purposes. EXAM: ULTRASOUND GUIDANCE FOR VASCULAR ACCESS IVC CATHETERIZATION AND VENOGRAM IVC FILTER INSERTION COMPARISON:  CT the chest, abdomen and pelvis - 06/03/2017 MEDICATIONS: None. ANESTHESIA/SEDATION: Fentanyl 100 mcg IV; Versed 2 mg IV Sedation Time: 11 minutes; The patient was continuously monitored during the procedure by the interventional radiology nurse under my direct supervision. CONTRAST:  50 cc Isovue-300 FLUOROSCOPY TIME:  48 seconds (20 mGy) COMPLICATIONS: None immediate. PROCEDURE: Informed written consent was obtained from the patient following explanation of the procedure, risks, benefits and alternatives. Given the presence of the right internal jugular approach port a catheter, sonographic evaluation was performed of the right groin and demonstrated wide patency of the right common femoral vein. As such, a right common femoral vein approach was selected for IVC filter placement. A time out was performed prior to the initiation of the  procedure. Maximal barrier sterile technique utilized including caps, mask, sterile gowns, sterile gloves, large sterile drape, hand hygiene, and Betadine prep. Under sterile condition and local anesthesia, right common femoral venous access was performed with ultrasound. An ultrasound image was saved and sent to PACS. Over a guidewire, the IVC filter delivery sheath and inner dilator were advanced into the IVC just above the IVC bifurcation. Contrast injection was performed for an IVC venogram. Through the delivery sheath, a retrievable Denali IVC filter was deployed below the level of the renal veins and above the IVC bifurcation. Limited post deployment venacavagram was performed. The delivery sheath was removed and hemostasis was obtained with manual compression. A dressing was placed. The patient tolerated the procedure well without immediate post procedural complication. FINDINGS: The IVC is patent.  No variant venous anatomy. A discrete filling defect is seen at the expected location of the confluence of the left renal vein and the IVC compatible with known left renal vein tumor thrombus demonstrated on preceding abdominal CT. Successful placement of the IVC filter below the level of the renal veins. IMPRESSION: Successful ultrasound and fluoroscopically guided placement of an infrarenal IVC filter. PLAN: Due to patient related comorbidities and/or  clinical necessity, this IVC filter should be considered a permanent device. This patient will not be actively followed for future filter retrieval. Electronically Signed   By: Sandi Mariscal M.D.   On: 06/16/2017 17:24   US Renal  Result Date: 06/14/2017 CLINICAL DATA:  Hematuria.  History of metastatic renal cell cancer. EXAM: RENAL / URINARY TRACT ULTRASOUND COMPLETE COMPARISON:  CT abdomen pelvis dated June 03, 2017. FINDINGS: Right Kidney: Length: 10.5 cm. Echogenicity within normal limits. No mass or hydronephrosis visualized. Left Kidney: Length: 12.2  cm. Large, heterogeneous mass involving the upper and midpole, measuring 7.7 x 7.1 x 5.2 cm, grossly unchanged when allowing for differences in technique. There is echogenic material within the collecting system. Bladder: The bladder is largely decompressed by a Foley catheter, with a small amount of echogenic debris seen surrounding the Foley balloon and within the lumen of the Foley catheter. IMPRESSION: 1. Small amount of echogenic debris within the largely decompressed bladder, with additional echogenic debris seen in the lumen of the Foley catheter, likely representing blood products. 2. Large left renal mass, consistent with known renal cell carcinoma, grossly unchanged when compared to recent CT. Echogenic debris within the left renal collecting system may represent tumor and/or blood products. Electronically Signed   By: Titus Dubin M.D.   On: 06/14/2017 09:59   Ir US Guide Vasc Access Right  Result Date: 06/16/2017 INDICATION: History of metastatic renal cell carcinoma, now with pulmonary embolism and lower extremity DVT. Patient suffered from chronic hematuria along with known metastatic disease and as such is a poor candidate for anticoagulation. As such, request made for placement of a the IVC filter for caval interruption purposes. EXAM: ULTRASOUND GUIDANCE FOR VASCULAR ACCESS IVC CATHETERIZATION AND VENOGRAM IVC FILTER INSERTION COMPARISON:  CT the chest, abdomen and pelvis - 06/03/2017 MEDICATIONS: None. ANESTHESIA/SEDATION: Fentanyl 100 mcg IV; Versed 2 mg IV Sedation Time: 11 minutes; The patient was continuously monitored during the procedure by the interventional radiology nurse under my direct supervision. CONTRAST:  50 cc Isovue-300 FLUOROSCOPY TIME:  48 seconds (20 mGy) COMPLICATIONS: None immediate. PROCEDURE: Informed written consent was obtained from the patient following explanation of the procedure, risks, benefits and alternatives. Given the presence of the right internal jugular  approach port a catheter, sonographic evaluation was performed of the right groin and demonstrated wide patency of the right common femoral vein. As such, a right common femoral vein approach was selected for IVC filter placement. A time out was performed prior to the initiation of the procedure. Maximal barrier sterile technique utilized including caps, mask, sterile gowns, sterile gloves, large sterile drape, hand hygiene, and Betadine prep. Under sterile condition and local anesthesia, right common femoral venous access was performed with ultrasound. An ultrasound image was saved and sent to PACS. Over a guidewire, the IVC filter delivery sheath and inner dilator were advanced into the IVC just above the IVC bifurcation. Contrast injection was performed for an IVC venogram. Through the delivery sheath, a retrievable Denali IVC filter was deployed below the level of the renal veins and above the IVC bifurcation. Limited post deployment venacavagram was performed. The delivery sheath was removed and hemostasis was obtained with manual compression. A dressing was placed. The patient tolerated the procedure well without immediate post procedural complication. FINDINGS: The IVC is patent.  No variant venous anatomy. A discrete filling defect is seen at the expected location of the confluence of the left renal vein and the IVC compatible with known left renal vein tumor  thrombus demonstrated on preceding abdominal CT. Successful placement of the IVC filter below the level of the renal veins. IMPRESSION: Successful ultrasound and fluoroscopically guided placement of an infrarenal IVC filter. PLAN: Due to patient related comorbidities and/or clinical necessity, this IVC filter should be considered a permanent device. This patient will not be actively followed for future filter retrieval. Electronically Signed   By: Sandi Mariscal M.D.   On: 06/16/2017 17:24   (Echo, Carotid, EGD, Colonoscopy, ERCP)     Subjective:   Discharge Exam: Vitals:   06/16/17 2110 06/17/17 0535  BP: 129/70 118/69  Pulse: 85 74  Resp: 16 16  Temp: 98.2 F (36.8 C) 98.2 F (36.8 C)  SpO2: 100% 99%   Vitals:   06/16/17 1650 06/16/17 1655 06/16/17 2110 06/17/17 0535  BP: 122/73 134/73 129/70 118/69  Pulse: 73  85 74  Resp: 12 18 16 16   Temp:   98.2 F (36.8 C) 98.2 F (36.8 C)  TempSrc:   Oral Oral  SpO2: 97%  100% 99%  Weight:    67.8 kg (149 lb 7.6 oz)  Height:        General: Pt is alert, awake, not in acute distress Cardiovascular: RRR, S1/S2 +, no rubs, no gallops Respiratory: CTA bilaterally, no wheezing, no rhonchi Abdominal: Soft, NT, ND, bowel sounds + Extremities: no edema, no cyanosis    The results of significant diagnostics from this hospitalization (including imaging, microbiology, ancillary and laboratory) are listed below for reference.     Microbiology: Recent Results (from the past 240 hour(s))  Urine C&S     Status: Abnormal   Collection Time: 06/13/17  1:40 PM  Result Value Ref Range Status   Specimen Description URINE, CLEAN CATCH  Final   Special Requests NONE  Final   Culture (A)  Final    <10,000 COLONIES/mL INSIGNIFICANT GROWTH Performed at [facility], [address], [place], [place] [address]    Report Status 06/14/2017 FINAL  Final  MRSA PCR Screening     Status: Abnormal   Collection Time: 06/15/17  8:30 AM  Result Value Ref Range Status   MRSA by PCR POSITIVE (A) NEGATIVE Final    Comment:        The GeneXpert MRSA Assay (FDA approved for NASAL specimens only), is one component of a comprehensive MRSA colonization surveillance program. It is not intended to diagnose MRSA infection nor to guide or monitor treatment for MRSA infections. RESULT CALLED TO, READ BACK BY AND VERIFIED WITH: M.VAUGHN AT [id number] ON 06/15/17 BY N.THOMPSON      Labs: BNP (last 3 results) No results for input(s): BNP in the last 8760 hours. Basic Metabolic  Panel: Recent Labs  Lab 06/13/17 1351 06/14/17 0908  NA 129* 134*  K 3.4* 3.4*  CL 96* 102  CO2 26 25  GLUCOSE 161* 147*  BUN 22* 19  CREATININE 1.36* 1.34*  CALCIUM 9.0 9.2   Liver Function Tests: No results for input(s): AST, ALT, ALKPHOS, BILITOT, PROT, ALBUMIN in the last 168 hours. No results for input(s): LIPASE, AMYLASE in the last 168 hours. No results for input(s): AMMONIA in the last 168 hours. CBC: Recent Labs  Lab 06/14/17 0908 06/15/17 0602 06/16/17 0555 06/16/17 1615 06/17/17 0540  WBC 9.2 7.3 6.0 5.5 5.3  HGB 8.3* 8.1* 7.6* 8.4* 8.5*  HCT 23.9* 23.6* 22.2* 24.5* 25.3*  MCV 89.2 90.1 90.6 89.7 89.7  PLT 31* 31* 33* 33* 35*   Cardiac Enzymes: No results  for input(s): CKTOTAL, CKMB, CKMBINDEX, TROPONINI in the last 168 hours. BNP: Invalid input(s): POCBNP CBG: No results for input(s): GLUCAP in the last 168 hours. D-Dimer No results for input(s): DDIMER in the last 72 hours. Hgb A1c No results for input(s): HGBA1C in the last 72 hours. Lipid Profile No results for input(s): CHOL, HDL, LDLCALC, TRIG, CHOLHDL, LDLDIRECT in the last 72 hours. Thyroid function studies No results for input(s): TSH, T4TOTAL, T3FREE, THYROIDAB in the last 72 hours.  Invalid input(s): FREET3 Anemia work up No results for input(s): VITAMINB12, FOLATE, FERRITIN, TIBC, IRON, RETICCTPCT in the last 72 hours. Urinalysis    Component Value Date/Time   COLORURINE RED (A) 06/13/2017 1340   APPEARANCEUR TURBID (A) 06/13/2017 1340   LABSPEC  06/13/2017 1340    TEST NOT REPORTED DUE TO COLOR INTERFERENCE OF URINE PIGMENT   LABSPEC 1.015 06/01/2017 1000   PHURINE  06/13/2017 1340    TEST NOT REPORTED DUE TO COLOR INTERFERENCE OF URINE PIGMENT   GLUCOSEU (A) 06/13/2017 1340    TEST NOT REPORTED DUE TO COLOR INTERFERENCE OF URINE PIGMENT   GLUCOSEU Negative 06/01/2017 1000   HGBUR (A) 06/13/2017 1340    TEST NOT REPORTED DUE TO COLOR INTERFERENCE OF URINE PIGMENT   BILIRUBINUR  (A) 06/13/2017 1340    TEST NOT REPORTED DUE TO COLOR INTERFERENCE OF URINE PIGMENT   BILIRUBINUR Negative 06/01/2017 1000   KETONESUR (A) 06/13/2017 1340    TEST NOT REPORTED DUE TO COLOR INTERFERENCE OF URINE PIGMENT   PROTEINUR (A) 06/13/2017 1340    TEST NOT REPORTED DUE TO COLOR INTERFERENCE OF URINE PIGMENT   UROBILINOGEN 0.2 06/01/2017 1000   NITRITE (A) 06/13/2017 1340    TEST NOT REPORTED DUE TO COLOR INTERFERENCE OF URINE PIGMENT   LEUKOCYTESUR (A) 06/13/2017 1340    TEST NOT REPORTED DUE TO COLOR INTERFERENCE OF URINE PIGMENT   LEUKOCYTESUR Trace 06/01/2017 1000   Sepsis Labs Invalid input(s): PROCALCITONIN,  WBC,  LACTICIDVEN Microbiology Recent Results (from the past 240 hour(s))  Urine C&S     Status: Abnormal   Collection Time: 06/13/17  1:40 PM  Result Value Ref Range Status   Specimen Description URINE, CLEAN CATCH  Final   Special Requests NONE  Final   Culture (A)  Final    <10,000 COLONIES/mL INSIGNIFICANT GROWTH Performed at [facility] Lab, [address] [place], [place] [address]    Report Status 06/14/2017 FINAL  Final  MRSA PCR Screening     Status: Abnormal   Collection Time: 06/15/17  8:30 AM  Result Value Ref Range Status   MRSA by PCR POSITIVE (A) NEGATIVE Final    Comment:        The GeneXpert MRSA Assay (FDA approved for NASAL specimens only), is one component of a comprehensive MRSA colonization surveillance program. It is not intended to diagnose MRSA infection nor to guide or monitor treatment for MRSA infections. RESULT CALLED TO, READ BACK BY AND VERIFIED WITH: M.VAUGHN AT 3419 ON 06/15/17 BY N.THOMPSON      Time coordinating discharge: Over 30 minutes  SIGNED:   Charlynne Cousins, MD  [facility] 06/17/2017, 12:09 PM Pager   If 7PM-7AM, please contact night-coverage [email or web address] Password [email or web address]

## 2017-06-24 DIAGNOSIS — R338 Other retention of urine: Secondary | ICD-10-CM | POA: Diagnosis not present

## 2017-06-24 DIAGNOSIS — C642 Malignant neoplasm of left kidney, except renal pelvis: Secondary | ICD-10-CM | POA: Diagnosis not present

## 2017-06-24 DIAGNOSIS — R310 Gross hematuria: Secondary | ICD-10-CM | POA: Diagnosis not present

## 2017-06-29 ENCOUNTER — Ambulatory Visit (HOSPITAL_BASED_OUTPATIENT_CLINIC_OR_DEPARTMENT_OTHER): Payer: Medicare Other

## 2017-06-29 ENCOUNTER — Other Ambulatory Visit (HOSPITAL_BASED_OUTPATIENT_CLINIC_OR_DEPARTMENT_OTHER): Payer: Medicare Other

## 2017-06-29 ENCOUNTER — Ambulatory Visit: Payer: Medicare Other | Admitting: Oncology

## 2017-06-29 ENCOUNTER — Ambulatory Visit (HOSPITAL_COMMUNITY)
Admission: RE | Admit: 2017-06-29 | Discharge: 2017-06-29 | Disposition: A | Discharge status: Home / Self Care | Payer: Medicare Other | Source: Ambulatory Visit | Attending: Oncology | Admitting: Oncology

## 2017-06-29 ENCOUNTER — Ambulatory Visit: Payer: Medicare Other

## 2017-06-29 ENCOUNTER — Telehealth: Payer: Self-pay | Admitting: Oncology

## 2017-06-29 VITALS — BP 114/64 | HR 85 | Temp 97.8°F | Resp 18 | Ht 70.0 in | Wt 152.8 lb

## 2017-06-29 DIAGNOSIS — C61 Malignant neoplasm of prostate: Secondary | ICD-10-CM | POA: Diagnosis not present

## 2017-06-29 DIAGNOSIS — C642 Malignant neoplasm of left kidney, except renal pelvis: Secondary | ICD-10-CM

## 2017-06-29 DIAGNOSIS — Z95828 Presence of other vascular implants and grafts: Secondary | ICD-10-CM | POA: Diagnosis not present

## 2017-06-29 DIAGNOSIS — R0609 Other forms of dyspnea: Secondary | ICD-10-CM | POA: Diagnosis not present

## 2017-06-29 DIAGNOSIS — D62 Acute posthemorrhagic anemia: Secondary | ICD-10-CM | POA: Insufficient documentation

## 2017-06-29 DIAGNOSIS — R609 Edema, unspecified: Secondary | ICD-10-CM | POA: Diagnosis not present

## 2017-06-29 DIAGNOSIS — I2699 Other pulmonary embolism without acute cor pulmonale: Secondary | ICD-10-CM

## 2017-06-29 DIAGNOSIS — C689 Malignant neoplasm of urinary organ, unspecified: Secondary | ICD-10-CM

## 2017-06-29 DIAGNOSIS — D500 Iron deficiency anemia secondary to blood loss (chronic): Secondary | ICD-10-CM

## 2017-06-29 DIAGNOSIS — D6481 Anemia due to antineoplastic chemotherapy: Secondary | ICD-10-CM

## 2017-06-29 DIAGNOSIS — E039 Hypothyroidism, unspecified: Secondary | ICD-10-CM

## 2017-06-29 DIAGNOSIS — C7800 Secondary malignant neoplasm of unspecified lung: Secondary | ICD-10-CM | POA: Diagnosis not present

## 2017-06-29 LAB — COMPREHENSIVE METABOLIC PANEL
ALBUMIN: 2.4 g/dL — AB (ref 3.5–5.0)
ALK PHOS: 91 U/L (ref 40–150)
ALT: 8 U/L (ref 0–55)
ANION GAP: 9 meq/L (ref 3–11)
AST: 12 U/L (ref 5–34)
BUN: 15.3 mg/dL (ref 7.0–26.0)
CO2: 23 mEq/L (ref 22–29)
Calcium: 8.1 mg/dL — ABNORMAL LOW (ref 8.4–10.4)
Chloride: 101 mEq/L (ref 98–109)
Creatinine: 1.2 mg/dL (ref 0.7–1.3)
Glucose: 134 mg/dl (ref 70–140)
POTASSIUM: 3.5 meq/L (ref 3.5–5.1)
Sodium: 133 mEq/L — ABNORMAL LOW (ref 136–145)
Total Bilirubin: 0.44 mg/dL (ref 0.20–1.20)
Total Protein: 5.7 g/dL — ABNORMAL LOW (ref 6.4–8.3)

## 2017-06-29 LAB — PREPARE RBC (CROSSMATCH)

## 2017-06-29 LAB — CBC WITH DIFFERENTIAL/PLATELET
BASO%: 0.5 % (ref 0.0–2.0)
BASOS ABS: 0 10*3/uL (ref 0.0–0.1)
EOS ABS: 0.1 10*3/uL (ref 0.0–0.5)
EOS%: 0.8 % (ref 0.0–7.0)
HCT: 22.3 % — ABNORMAL LOW (ref 38.4–49.9)
HEMOGLOBIN: 7.5 g/dL — AB (ref 13.0–17.1)
LYMPH%: 8 % — AB (ref 14.0–49.0)
MCH: 30.7 pg (ref 27.2–33.4)
MCHC: 33.4 g/dL (ref 32.0–36.0)
MCV: 91.7 fL (ref 79.3–98.0)
MONO#: 1 10*3/uL — ABNORMAL HIGH (ref 0.1–0.9)
MONO%: 11.7 % (ref 0.0–14.0)
NEUT#: 7.1 10*3/uL — ABNORMAL HIGH (ref 1.5–6.5)
NEUT%: 79 % — ABNORMAL HIGH (ref 39.0–75.0)
Platelets: 71 10*3/uL — ABNORMAL LOW (ref 140–400)
RBC: 2.44 10*6/uL — AB (ref 4.20–5.82)
RDW: 18.7 % — ABNORMAL HIGH (ref 11.0–14.6)
WBC: 9 10*3/uL (ref 4.0–10.3)
lymph#: 0.7 10*3/uL — ABNORMAL LOW (ref 0.9–3.3)

## 2017-06-29 MED ORDER — HEPARIN SOD (PORK) LOCK FLUSH 100 UNIT/ML IV SOLN
500.0000 [IU] | Freq: Every day | INTRAVENOUS | Status: AC | PRN
  Administered 2017-06-29: 500 [IU]
  Filled 2017-06-29: qty 5

## 2017-06-29 MED ORDER — SODIUM CHLORIDE 0.9% FLUSH
10.0000 mL | INTRAVENOUS | Status: DC | PRN
  Administered 2017-06-29: 10 mL via INTRAVENOUS
  Filled 2017-06-29: qty 10

## 2017-06-29 MED ORDER — ACETAMINOPHEN 325 MG PO TABS
ORAL_TABLET | ORAL | Status: AC
  Filled 2017-06-29: qty 2

## 2017-06-29 MED ORDER — ACETAMINOPHEN 325 MG PO TABS
650.0000 mg | ORAL_TABLET | Freq: Once | ORAL | Status: AC
  Administered 2017-06-29: 650 mg via ORAL

## 2017-06-29 MED ORDER — SODIUM CHLORIDE 0.9 % IV SOLN
250.0000 mL | Freq: Once | INTRAVENOUS | Status: AC
  Administered 2017-06-29: 250 mL via INTRAVENOUS

## 2017-06-29 MED ORDER — FUROSEMIDE 10 MG/ML IJ SOLN
20.0000 mg | Freq: Once | INTRAMUSCULAR | Status: AC
  Administered 2017-06-29: 20 mg via INTRAVENOUS

## 2017-06-29 MED ORDER — SODIUM CHLORIDE 0.9% FLUSH
10.0000 mL | INTRAVENOUS | Status: AC | PRN
  Administered 2017-06-29: 10 mL
  Filled 2017-06-29: qty 10

## 2017-06-29 MED ORDER — FUROSEMIDE 10 MG/ML IJ SOLN
INTRAMUSCULAR | Status: AC
  Filled 2017-06-29: qty 2

## 2017-06-29 MED ORDER — DIPHENHYDRAMINE HCL 25 MG PO CAPS
ORAL_CAPSULE | ORAL | Status: AC
  Filled 2017-06-29: qty 1

## 2017-06-29 MED ORDER — DIPHENHYDRAMINE HCL 25 MG PO CAPS
25.0000 mg | ORAL_CAPSULE | Freq: Once | ORAL | Status: AC
  Administered 2017-06-29: 25 mg via ORAL

## 2017-06-29 NOTE — Progress Notes (Signed)
Hematology and Oncology Follow Up Visit  [name] [name] [id number] 04/24/1946 72 y.o. 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 He presented with 4.9 cm mass of the upper pole of the left kidney with documented pulmonary metastasis. Neurological confirmation done on 04/15/2015 with a biopsy showed urothelial carcinoma.   Prior Therapy:   Status post biopsy of the left renal mass done on 04/15/2015. He is status post Port-A-Cath insertion on 05/21/2015. Systemic chemotherapy in the form of cisplatin and gemcitabine started on 05/27/2015. He is status post 6 cycles completed on 09/16/2015. CT scan on 01/06/2016 showed progression of disease. Pembrolizumab 200 mg every 3 weeks cycle 1 on 02/13/2016. Therapy concluded in October 2018 because of progression of disease.  Current therapy:  Carboplatin and gemcitabine chemotherapy started on 04/13/2017.  He is status post 3 cycles of therapy.  Interim History:  [name] [name] presents today for a follow-up visit. Since the last visit, he was hospitalized on 06/13/2017 and was discharged on 06/17/2017.  He presented with worsening hematuria and symptomatic anemia.  He has been started on Xarelto for a recent diagnosis of pulmonary embolism in his hematuria became progressively worse.  During his hospitalizations, lower extremity Dopplers were obtained and showed positive for deep vein thrombosis and IVC filter was placed by Dr. Pascal Lux on 06/16/2017.  Xarelto was discontinued permanently.  He did receive 1 unit of packed red cell transfusion and was discharged.  Since his discharge, he felt okay for short period of time and now feeling poorly.  He is reporting more dyspnea on exertion and slight lower extremity edema.  His appetite has been poor and performance status is declining.  He continues to have issues with  hematuria at this time although it is slightly less since his discharge.   He does not report any headaches, blurry vision, seizures. He does not report any fevers, chills, or sweats. He does not report any chest pain, palpitation, orthopnea or leg edema. He does not report any wheezing or hemoptysis. He does not report any  vomiting, abdominal pain does report occasional dyspepsia. He does not report any frequency, urgency or hesitancy. He does not report any skeletal complaints. Remaining review of systems unremarkable.   Medications: I have reviewed the patient's current medications.  Current Outpatient Medications  Medication Sig Dispense Refill  . acetaminophen (TYLENOL) 325 MG tablet Take 650 mg by mouth every 6 (six) hours as needed for moderate pain or headache.     [name] aspirin 81 MG chewable tablet Chew 1 tablet (81 mg total) by mouth daily.    [name] atorvastatin (LIPITOR) 40 MG tablet Take 1 tablet (40 mg total) daily by mouth. 90 tablet 3  . benzonatate (TESSALON) 200 MG capsule TAKE ONE CAPSULE BY MOUTH 3 TIMES A DAY AS NEEDED FOR COUGH 30 capsule 0  . Cholecalciferol (VITAMIN D) 1000 UNITS capsule Take 1,000 Units by mouth daily.      . diphenhydrAMINE (BENADRYL) 25 MG tablet Take 25 mg by mouth every 6 (six) hours as needed for sleep.     [name] docusate sodium (COLACE) 100 MG capsule Take 100 mg by mouth 2 (two) times daily as needed (constipation).    . famotidine (PEPCID) 20 MG tablet Take 1 tablet (20 mg total) by mouth daily. 30 tablet 2  . levothyroxine (SYNTHROID, LEVOTHROID) 100 MCG tablet Take 100 mcg daily before breakfast  by mouth.    . lidocaine-prilocaine (EMLA) cream Apply to port-a-cath 1-2 hours prior to acces. Cover with saran wrap. 30 g 1  . metoprolol tartrate (LOPRESSOR) 25 MG tablet Take 0.5 tablets (12.5 mg total) 2 (two) times daily by mouth. 90 tablet 3  . nitroGLYCERIN (NITROSTAT) 0.4 MG SL tablet Place 1 tablet (0.4 mg total) every 5 (five) minutes as needed under the  tongue. May repeat up to 3 doses. 100 tablet 3  . polycarbophil (FIBERCON) 625 MG tablet Take 625 mg by mouth 2 (two) times daily.     [name] PRESCRIPTION MEDICATION Inject 160 mg into the muscle daily. Gentamycin given for 3 days at [facility]    . prochlorperazine (COMPAZINE) 10 MG tablet Take 1 tablet (10 mg total) by mouth every 6 (six) hours as needed for nausea or vomiting. 30 tablet 0  . ranitidine (ZANTAC) 150 MG tablet Take 150 mg by mouth 2 (two) times daily.    . sildenafil (VIAGRA) 50 MG tablet Take 50 mg by mouth as needed for erectile dysfunction.     . traMADol (ULTRAM) 50 MG tablet Take 1 tablet (50 mg total) by mouth every 6 (six) hours as needed. (Patient taking differently: Take 50 mg by mouth every 6 (six) hours as needed for moderate pain or severe pain. ) 30 tablet 0   No current facility-administered medications for this visit.    Facility-Administered Medications Ordered in Other Visits  Medication Dose Route Frequency Provider Last Rate Last Dose  . sodium chloride flush (NS) 0.9 % injection 10 mL  10 mL Intravenous PRN Wyatt Portela, MD   10 mL at 04/20/17 3664     Allergies:  Allergies  Allergen Reactions  . Hydrocodone Other (See Comments)    Became too sedated    Past Medical History, Surgical history, Social history, and Family History were reviewed and updated.   Physical Exam: Blood pressure 114/64, pulse 85, temperature 97.8 F (36.6 C), temperature source Oral, resp. rate 18, height 5\' 10"  (1.778 m), weight 152 lb 12.8 oz (69.3 kg), SpO2 100 %. ECOG: 1 General appearance: chronically ill-appearing gentleman did not appear in distress. Head: Normocephalic, without obvious abnormality no oral thrush or ulcers. Neck: no adenopathy no thyroid masses. Lymph nodes: Cervical, supraclavicular, and axillary nodes normal. Heart:regular rate and rhythm, S1, S2 normal, no murmur, click, rub or gallop Lung:chest clear, no wheezing, rales, normal symmetric  air entry Abdomin: soft, non-tender, without masses or organomegaly no rebound or guarding. EXT: Edema noted bilaterally at the ankle level. Skin: Appears slightly pale.  Lab Results: Lab Results  Component Value Date   WBC 9.0 06/29/2017   HGB 7.5 (L) 06/29/2017   HCT 22.3 (L) 06/29/2017   MCV 91.7 06/29/2017   PLT 71 (L) 06/29/2017     Chemistry      Component Value Date/Time   NA 134 (L) 06/14/2017 0908   NA 137 06/08/2017 1041   K 3.4 (L) 06/14/2017 0908   K 4.0 06/08/2017 1041   CL 102 06/14/2017 0908   CO2 25 06/14/2017 0908   CO2 23 06/08/2017 1041   BUN 19 06/14/2017 0908   BUN 22.2 06/08/2017 1041   CREATININE 1.34 (H) 06/14/2017 0908   CREATININE 1.4 (H) 06/08/2017 1041      Component Value Date/Time   CALCIUM 9.2 06/14/2017 0908   CALCIUM 9.4 06/08/2017 1041   ALKPHOS 109 06/08/2017 1041   AST 22 06/08/2017 1041   ALT 21 06/08/2017 1041  BILITOT 0.37 06/08/2017 6831      73 year old gentleman with the following issues:  1. 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 He did have a pathological confirmation done on 04/15/2015 with the specimen showed urothelial carcinoma.  He is status post 6 cycles of therapy completed in March 2017. CT scan on 04/11/2017showed continuous response to therapy. He had further decrease in his bilateral pulmonary nodules as well as decrease in the left upper pole renal lesion.   CT scan on 01/06/2016 showed mild progression of disease especially in his primary tumor. His pulmonary nodules have remained stable.   He is S/P Pembrolizumab between August 2017 and October 2018.  CT scan on 03/31/2017 showed clear progression of disease.  He is receiving salvage chemotherapy with carboplatin and gemcitabine.   After 3 cycles of therapy, CT scan obtained on  06/03/2017 showed positive response to therapy with decrease in his lung metastasis as well as primary tumor.  Chemotherapy will be on hold for the time being because of his ongoing cytopenias and hematuria.  His platelet count remains low to proceed with chemotherapy and will resume once is fully recovered.   2. Hypercalcemia: He received a Zometa on 03/24/2017 and his calcium was corrected.  His calcium has normalized at this time currently at 9.2 on 06/14/2017.  3. IV access: Port-A-Cath remains in use without complications.  4. Hypotension: BP is back to normal at this time.   5. Anemia: Continues to have issues with anemia related to chronic blood loss as well as chemotherapy.  We will transfuse 2 units of packed red cells today and to need to follow his counts weekly.  6. Hypothyroidism: He is currently on 100 g of Synthroid. He has follow up with endocrinology.   7.  Hematuria: Related to his primary kidney neoplasm.  Despite positive response to chemotherapy his tumor continues to have issues with bleeding.  Surgical options are limited at this time and embolization has been evaluated in the past but was not an option.  I will discuss this with urology as well as interventional radiology.  8.  Flank pain: He uses Tylenol at this time and continues to be manageable.  9.  Pulmonary embolism: Xarelto was discontinued because of worsening hematuria.  IVC filter remains in place.  10. Follow-up: Will be in 3 weeks to evaluate resumption of chemotherapy.  Zola Button, MD 1/2/201910:14 AM

## 2017-06-29 NOTE — Telephone Encounter (Signed)
Gave patient avs report and appointments for January. Spoke with Andria Frames at [facility] patient will have PRBC's at [facility] 1/9 @ 10 am - [facility] at capacity. Patient aware.

## 2017-06-29 NOTE — Patient Instructions (Signed)

## 2017-06-30 LAB — TYPE AND SCREEN
ABO/RH(D): B POS
Antibody Screen: NEGATIVE
UNIT DIVISION: 0
Unit division: 0

## 2017-06-30 LAB — BPAM RBC
BLOOD PRODUCT EXPIRATION DATE: 201901292359
Blood Product Expiration Date: 201901292359
ISSUE DATE / TIME: 201901021342
ISSUE DATE / TIME: 201901021342
UNIT TYPE AND RH: 7300
UNIT TYPE AND RH: 7300

## 2017-07-06 ENCOUNTER — Inpatient Hospital Stay: Payer: Medicare Other | Attending: Oncology

## 2017-07-06 ENCOUNTER — Ambulatory Visit (HOSPITAL_COMMUNITY)
Admission: RE | Admit: 2017-07-06 | Discharge: 2017-07-06 | Disposition: A | Discharge status: Home / Self Care | Payer: Medicare Other | Source: Ambulatory Visit | Attending: Oncology | Admitting: Oncology

## 2017-07-06 ENCOUNTER — Other Ambulatory Visit: Payer: Self-pay | Admitting: *Deleted

## 2017-07-06 DIAGNOSIS — Z9221 Personal history of antineoplastic chemotherapy: Secondary | ICD-10-CM | POA: Diagnosis not present

## 2017-07-06 DIAGNOSIS — R609 Edema, unspecified: Secondary | ICD-10-CM | POA: Diagnosis not present

## 2017-07-06 DIAGNOSIS — E039 Hypothyroidism, unspecified: Secondary | ICD-10-CM | POA: Insufficient documentation

## 2017-07-06 DIAGNOSIS — Z7982 Long term (current) use of aspirin: Secondary | ICD-10-CM | POA: Insufficient documentation

## 2017-07-06 DIAGNOSIS — C61 Malignant neoplasm of prostate: Secondary | ICD-10-CM | POA: Diagnosis not present

## 2017-07-06 DIAGNOSIS — D61818 Other pancytopenia: Secondary | ICD-10-CM | POA: Diagnosis not present

## 2017-07-06 DIAGNOSIS — D62 Acute posthemorrhagic anemia: Secondary | ICD-10-CM

## 2017-07-06 DIAGNOSIS — Z79899 Other long term (current) drug therapy: Secondary | ICD-10-CM | POA: Insufficient documentation

## 2017-07-06 DIAGNOSIS — I959 Hypotension, unspecified: Secondary | ICD-10-CM | POA: Insufficient documentation

## 2017-07-06 DIAGNOSIS — R0602 Shortness of breath: Secondary | ICD-10-CM | POA: Diagnosis not present

## 2017-07-06 DIAGNOSIS — D500 Iron deficiency anemia secondary to blood loss (chronic): Secondary | ICD-10-CM | POA: Diagnosis not present

## 2017-07-06 DIAGNOSIS — D6481 Anemia due to antineoplastic chemotherapy: Secondary | ICD-10-CM | POA: Diagnosis not present

## 2017-07-06 DIAGNOSIS — I2699 Other pulmonary embolism without acute cor pulmonale: Secondary | ICD-10-CM | POA: Diagnosis not present

## 2017-07-06 DIAGNOSIS — C7800 Secondary malignant neoplasm of unspecified lung: Secondary | ICD-10-CM | POA: Insufficient documentation

## 2017-07-06 DIAGNOSIS — C642 Malignant neoplasm of left kidney, except renal pelvis: Secondary | ICD-10-CM | POA: Insufficient documentation

## 2017-07-06 DIAGNOSIS — C689 Malignant neoplasm of urinary organ, unspecified: Secondary | ICD-10-CM

## 2017-07-06 DIAGNOSIS — F329 Major depressive disorder, single episode, unspecified: Secondary | ICD-10-CM | POA: Diagnosis not present

## 2017-07-06 DIAGNOSIS — G893 Neoplasm related pain (acute) (chronic): Secondary | ICD-10-CM | POA: Insufficient documentation

## 2017-07-06 DIAGNOSIS — R5383 Other fatigue: Secondary | ICD-10-CM | POA: Diagnosis not present

## 2017-07-06 LAB — COMPREHENSIVE METABOLIC PANEL
ALT: 5 U/L (ref 0–55)
AST: 11 U/L (ref 5–34)
Albumin: 2.2 g/dL — ABNORMAL LOW (ref 3.5–5.0)
Alkaline Phosphatase: 90 U/L (ref 40–150)
Anion gap: 7 (ref 3–11)
BILIRUBIN TOTAL: 0.3 mg/dL (ref 0.2–1.2)
BUN: 16 mg/dL (ref 7–26)
CALCIUM: 8.5 mg/dL (ref 8.4–10.4)
CO2: 25 mmol/L (ref 22–29)
Chloride: 104 mmol/L (ref 98–109)
Creatinine, Ser: 1.2 mg/dL (ref 0.70–1.30)
GFR calc Af Amer: >60 mL/min (ref >60)
GFR, EST NON AFRICAN AMERICAN: 58 mL/min — AB (ref >60)
Glucose, Bld: 132 mg/dL (ref 70–140)
POTASSIUM: 3.7 mmol/L (ref 3.5–5.1)
Sodium: 136 mmol/L (ref 136–145)
Total Protein: 5.4 g/dL — ABNORMAL LOW (ref 6.4–8.3)

## 2017-07-06 LAB — SAMPLE TO BLOOD BANK

## 2017-07-06 LAB — PREPARE RBC (CROSSMATCH)

## 2017-07-06 LAB — CBC WITH DIFFERENTIAL/PLATELET
ABS GRANULOCYTE: 9.3 10*3/uL — AB (ref 1.5–6.5)
Basophils Absolute: 0 10*3/uL (ref 0.0–0.1)
Basophils Relative: 0 %
Eosinophils Absolute: 0.1 10*3/uL (ref 0.0–0.5)
Eosinophils Relative: 1 %
HEMATOCRIT: 24.2 % — AB (ref 38.4–49.9)
Hemoglobin: 7.9 g/dL — ABNORMAL LOW (ref 13.0–17.1)
LYMPHS PCT: 5 %
Lymphs Abs: 0.5 10*3/uL — ABNORMAL LOW (ref 0.9–3.3)
MCH: 29.5 pg (ref 27.2–33.4)
MCHC: 32.8 g/dL (ref 32.0–36.0)
MCV: 90 fL (ref 79.3–98.0)
MONO ABS: 1 10*3/uL — AB (ref 0.1–0.9)
MONOS PCT: 9 %
Neutro Abs: 9.3 10*3/uL — ABNORMAL HIGH (ref 1.5–6.5)
Neutrophils Relative %: 85 %
Platelets: 78 10*3/uL — ABNORMAL LOW (ref 140–400)
RBC: 2.69 MIL/uL — AB (ref 4.20–5.82)
RDW: 20.7 % — AB (ref 11.0–15.6)
WBC: 10.9 10*3/uL — AB (ref 4.0–10.3)

## 2017-07-06 MED ORDER — SODIUM CHLORIDE 0.9% FLUSH
10.0000 mL | INTRAVENOUS | Status: AC | PRN
  Administered 2017-07-06: 10 mL

## 2017-07-06 MED ORDER — DIPHENHYDRAMINE HCL 25 MG PO CAPS
25.0000 mg | ORAL_CAPSULE | Freq: Once | ORAL | Status: AC
  Administered 2017-07-06: 25 mg via ORAL
  Filled 2017-07-06: qty 1

## 2017-07-06 MED ORDER — ACETAMINOPHEN 325 MG PO TABS
650.0000 mg | ORAL_TABLET | Freq: Once | ORAL | Status: AC
  Administered 2017-07-06: 650 mg via ORAL
  Filled 2017-07-06: qty 2

## 2017-07-06 MED ORDER — SODIUM CHLORIDE 0.9 % IV SOLN: Freq: Once | INTRAVENOUS | Status: DC

## 2017-07-06 MED ORDER — SODIUM CHLORIDE 0.9 % IV SOLN: 250.0000 mL | Freq: Once | INTRAVENOUS | Status: DC

## 2017-07-06 MED ORDER — HEPARIN SOD (PORK) LOCK FLUSH 100 UNIT/ML IV SOLN
500.0000 [IU] | Freq: Every day | INTRAVENOUS | Status: AC | PRN
  Administered 2017-07-06: 500 [IU]
  Filled 2017-07-06: qty 5

## 2017-07-06 NOTE — Progress Notes (Signed)
PATIENT CARE CENTER NOTE  Diagnosis: Anemia   Provider: Dr. [name]   Procedure: 2 units of PRBC's   Note: Patient received infusion of 2 units of blood. Patient tolerated infusion well with no adverse reaction. Discharge instructions given to patient. Patient alert, oriented and ambulatory at discharge.

## 2017-07-06 NOTE — Discharge Instructions (Signed)
Blood Transfusion, Adult, Care After This sheet gives you information about how to care for yourself after your procedure. Your health care provider may also give you more specific instructions. If you have problems or questions, contact your health care provider. What can I expect after the procedure? After your procedure, it is common to have:  Bruising and soreness where the IV tube was inserted.  Headache.  Follow these instructions at home:  Take over-the-counter and prescription medicines only as told by your health care provider.  Return to your normal activities as told by your health care provider.  Follow instructions from your health care provider about how to take care of your IV insertion site. Make sure you: ? Wash your hands with soap and water before you change your bandage (dressing). If soap and water are not available, use hand sanitizer. ? Change your dressing as told by your health care provider.  Check your IV insertion site every day for signs of infection. Check for: ? More redness, swelling, or pain. ? More fluid or blood. ? Warmth. ? Pus or a bad smell. Contact a health care provider if:  You have more redness, swelling, or pain around the IV insertion site.  You have more fluid or blood coming from the IV insertion site.  Your IV insertion site feels warm to the touch.  You have pus or a bad smell coming from the IV insertion site.  Your urine turns pink, red, or brown.  You feel weak after doing your normal activities. Get help right away if:  You have signs of a serious allergic or immune system reaction, including: ? Itchiness. ? Hives. ? Trouble breathing. ? Anxiety. ? Chest or lower back pain. ? Fever, flushing, and chills. ? Rapid pulse. ? Rash. ? Diarrhea. ? Vomiting. ? Dark urine. ? Serious headache. ? Dizziness. ? Stiff neck. ? Yellow coloration of the face or the white parts of the eyes (jaundice). This information is not  intended to replace advice given to you by your health care provider. Make sure you discuss any questions you have with your health care provider. Document Released: 07/05/2014 Document Revised: 02/11/2016 Document Reviewed: 12/29/2015 Elsevier Interactive Patient Education  2018 Elsevier Inc[employer]  

## 2017-07-07 LAB — BPAM RBC
BLOOD PRODUCT EXPIRATION DATE: 201902022359
Blood Product Expiration Date: 201902022359
ISSUE DATE / TIME: 201901091137
ISSUE DATE / TIME: 201901091137
UNIT TYPE AND RH: 7300
UNIT TYPE AND RH: 7300

## 2017-07-07 LAB — TYPE AND SCREEN
ABO/RH(D): B POS
Antibody Screen: NEGATIVE
UNIT DIVISION: 0
Unit division: 0

## 2017-07-13 ENCOUNTER — Inpatient Hospital Stay: Payer: Medicare Other

## 2017-07-13 ENCOUNTER — Encounter: Payer: Self-pay | Admitting: *Deleted

## 2017-07-13 DIAGNOSIS — D6481 Anemia due to antineoplastic chemotherapy: Secondary | ICD-10-CM | POA: Diagnosis not present

## 2017-07-13 DIAGNOSIS — E039 Hypothyroidism, unspecified: Secondary | ICD-10-CM | POA: Diagnosis not present

## 2017-07-13 DIAGNOSIS — Z9221 Personal history of antineoplastic chemotherapy: Secondary | ICD-10-CM | POA: Diagnosis not present

## 2017-07-13 DIAGNOSIS — G893 Neoplasm related pain (acute) (chronic): Secondary | ICD-10-CM | POA: Diagnosis not present

## 2017-07-13 DIAGNOSIS — D61818 Other pancytopenia: Secondary | ICD-10-CM | POA: Diagnosis not present

## 2017-07-13 DIAGNOSIS — R609 Edema, unspecified: Secondary | ICD-10-CM | POA: Diagnosis not present

## 2017-07-13 DIAGNOSIS — R0602 Shortness of breath: Secondary | ICD-10-CM | POA: Diagnosis not present

## 2017-07-13 DIAGNOSIS — C642 Malignant neoplasm of left kidney, except renal pelvis: Secondary | ICD-10-CM | POA: Diagnosis not present

## 2017-07-13 DIAGNOSIS — Z7982 Long term (current) use of aspirin: Secondary | ICD-10-CM | POA: Diagnosis not present

## 2017-07-13 DIAGNOSIS — I959 Hypotension, unspecified: Secondary | ICD-10-CM | POA: Diagnosis not present

## 2017-07-13 DIAGNOSIS — I2699 Other pulmonary embolism without acute cor pulmonale: Secondary | ICD-10-CM | POA: Diagnosis not present

## 2017-07-13 DIAGNOSIS — Z79899 Other long term (current) drug therapy: Secondary | ICD-10-CM | POA: Diagnosis not present

## 2017-07-13 DIAGNOSIS — C61 Malignant neoplasm of prostate: Secondary | ICD-10-CM

## 2017-07-13 DIAGNOSIS — C7800 Secondary malignant neoplasm of unspecified lung: Secondary | ICD-10-CM | POA: Diagnosis not present

## 2017-07-13 DIAGNOSIS — R5383 Other fatigue: Secondary | ICD-10-CM | POA: Diagnosis not present

## 2017-07-13 DIAGNOSIS — D500 Iron deficiency anemia secondary to blood loss (chronic): Secondary | ICD-10-CM | POA: Diagnosis not present

## 2017-07-13 DIAGNOSIS — C689 Malignant neoplasm of urinary organ, unspecified: Secondary | ICD-10-CM

## 2017-07-13 LAB — CBC WITH DIFFERENTIAL/PLATELET
BASOS ABS: 0 10*3/uL (ref 0.0–0.1)
BASOS PCT: 0 %
EOS ABS: 0.1 10*3/uL (ref 0.0–0.5)
Eosinophils Relative: 1 %
HCT: 28.7 % — ABNORMAL LOW (ref 38.4–49.9)
HEMOGLOBIN: 9.2 g/dL — AB (ref 13.0–17.1)
Lymphocytes Relative: 7 %
Lymphs Abs: 0.8 10*3/uL — ABNORMAL LOW (ref 0.9–3.3)
MCH: 29 pg (ref 27.2–33.4)
MCHC: 32.1 g/dL (ref 32.0–36.0)
MCV: 90.5 fL (ref 79.3–98.0)
MONOS PCT: 9 %
Monocytes Absolute: 1.1 10*3/uL — ABNORMAL HIGH (ref 0.1–0.9)
NEUTROS ABS: 10.2 10*3/uL — AB (ref 1.5–6.5)
NEUTROS PCT: 83 %
Platelets: 56 10*3/uL — ABNORMAL LOW (ref 140–400)
RBC: 3.17 MIL/uL — ABNORMAL LOW (ref 4.20–5.82)
RDW: 19.6 % — AB (ref 11.0–15.6)
WBC: 12.2 10*3/uL — AB (ref 4.0–10.3)

## 2017-07-13 LAB — COMPREHENSIVE METABOLIC PANEL
ALK PHOS: 96 U/L (ref 40–150)
ALT: 5 U/L (ref 0–55)
AST: 14 U/L (ref 5–34)
Albumin: 2.2 g/dL — ABNORMAL LOW (ref 3.5–5.0)
Anion gap: 9 (ref 3–11)
BILIRUBIN TOTAL: 0.5 mg/dL (ref 0.2–1.2)
BUN: 16 mg/dL (ref 7–26)
CALCIUM: 9.8 mg/dL (ref 8.4–10.4)
CO2: 26 mmol/L (ref 22–29)
CREATININE: 1.18 mg/dL (ref 0.70–1.30)
Chloride: 101 mmol/L (ref 98–109)
GFR, EST NON AFRICAN AMERICAN: 59 mL/min — AB (ref >60)
Glucose, Bld: 112 mg/dL (ref 70–140)
Potassium: 4 mmol/L (ref 3.5–5.1)
Sodium: 136 mmol/L (ref 136–145)
TOTAL PROTEIN: 5.5 g/dL — AB (ref 6.4–8.3)

## 2017-07-20 ENCOUNTER — Telehealth: Payer: Self-pay | Admitting: Oncology

## 2017-07-20 ENCOUNTER — Inpatient Hospital Stay: Payer: Medicare Other

## 2017-07-20 ENCOUNTER — Inpatient Hospital Stay (HOSPITAL_BASED_OUTPATIENT_CLINIC_OR_DEPARTMENT_OTHER): Payer: Medicare Other | Admitting: Oncology

## 2017-07-20 ENCOUNTER — Inpatient Hospital Stay: Payer: Medicare Other | Admitting: Nutrition

## 2017-07-20 ENCOUNTER — Other Ambulatory Visit: Payer: Self-pay | Admitting: *Deleted

## 2017-07-20 DIAGNOSIS — C642 Malignant neoplasm of left kidney, except renal pelvis: Secondary | ICD-10-CM

## 2017-07-20 DIAGNOSIS — R609 Edema, unspecified: Secondary | ICD-10-CM

## 2017-07-20 DIAGNOSIS — I959 Hypotension, unspecified: Secondary | ICD-10-CM | POA: Diagnosis not present

## 2017-07-20 DIAGNOSIS — I2699 Other pulmonary embolism without acute cor pulmonale: Secondary | ICD-10-CM

## 2017-07-20 DIAGNOSIS — D6481 Anemia due to antineoplastic chemotherapy: Secondary | ICD-10-CM | POA: Diagnosis not present

## 2017-07-20 DIAGNOSIS — R0602 Shortness of breath: Secondary | ICD-10-CM

## 2017-07-20 DIAGNOSIS — C689 Malignant neoplasm of urinary organ, unspecified: Secondary | ICD-10-CM

## 2017-07-20 DIAGNOSIS — D61818 Other pancytopenia: Secondary | ICD-10-CM

## 2017-07-20 DIAGNOSIS — F329 Major depressive disorder, single episode, unspecified: Secondary | ICD-10-CM

## 2017-07-20 DIAGNOSIS — D62 Acute posthemorrhagic anemia: Secondary | ICD-10-CM

## 2017-07-20 DIAGNOSIS — R5383 Other fatigue: Secondary | ICD-10-CM | POA: Diagnosis not present

## 2017-07-20 DIAGNOSIS — G893 Neoplasm related pain (acute) (chronic): Secondary | ICD-10-CM

## 2017-07-20 DIAGNOSIS — E039 Hypothyroidism, unspecified: Secondary | ICD-10-CM | POA: Diagnosis not present

## 2017-07-20 DIAGNOSIS — Z9221 Personal history of antineoplastic chemotherapy: Secondary | ICD-10-CM | POA: Diagnosis not present

## 2017-07-20 DIAGNOSIS — Z79899 Other long term (current) drug therapy: Secondary | ICD-10-CM | POA: Diagnosis not present

## 2017-07-20 DIAGNOSIS — C7800 Secondary malignant neoplasm of unspecified lung: Secondary | ICD-10-CM

## 2017-07-20 DIAGNOSIS — D500 Iron deficiency anemia secondary to blood loss (chronic): Secondary | ICD-10-CM | POA: Diagnosis not present

## 2017-07-20 DIAGNOSIS — Z95828 Presence of other vascular implants and grafts: Secondary | ICD-10-CM

## 2017-07-20 DIAGNOSIS — C61 Malignant neoplasm of prostate: Secondary | ICD-10-CM

## 2017-07-20 DIAGNOSIS — Z7982 Long term (current) use of aspirin: Secondary | ICD-10-CM

## 2017-07-20 LAB — CBC WITH DIFFERENTIAL/PLATELET
BASOS ABS: 0 10*3/uL (ref 0.0–0.1)
BASOS PCT: 0 %
EOS ABS: 0.1 10*3/uL (ref 0.0–0.5)
EOS PCT: 1 %
HCT: 25.8 % — ABNORMAL LOW (ref 38.4–49.9)
HEMOGLOBIN: 8.3 g/dL — AB (ref 13.0–17.1)
Lymphocytes Relative: 7 %
Lymphs Abs: 0.8 10*3/uL — ABNORMAL LOW (ref 0.9–3.3)
MCH: 29.1 pg (ref 27.2–33.4)
MCHC: 32.2 g/dL (ref 32.0–36.0)
MCV: 90.5 fL (ref 79.3–98.0)
Monocytes Absolute: 1 10*3/uL — ABNORMAL HIGH (ref 0.1–0.9)
Monocytes Relative: 9 %
NEUTROS PCT: 83 %
Neutro Abs: 9.8 10*3/uL — ABNORMAL HIGH (ref 1.5–6.5)
PLATELETS: 66 10*3/uL — AB (ref 140–400)
RBC: 2.85 MIL/uL — AB (ref 4.20–5.82)
RDW: 20.1 % — ABNORMAL HIGH (ref 11.0–15.6)
WBC: 11.7 10*3/uL — AB (ref 4.0–10.3)

## 2017-07-20 LAB — COMPREHENSIVE METABOLIC PANEL
ALBUMIN: 2.2 g/dL — AB (ref 3.5–5.0)
ALK PHOS: 96 U/L (ref 40–150)
ALT: 6 U/L (ref 0–55)
AST: 12 U/L (ref 5–34)
Anion gap: 7 (ref 3–11)
BUN: 17 mg/dL (ref 7–26)
CHLORIDE: 100 mmol/L (ref 98–109)
CO2: 26 mmol/L (ref 22–29)
CREATININE: 1.11 mg/dL (ref 0.70–1.30)
Calcium: 9.2 mg/dL (ref 8.4–10.4)
GFR calc Af Amer: >60 mL/min (ref >60)
GFR calc non Af Amer: >60 mL/min (ref >60)
GLUCOSE: 125 mg/dL (ref 70–140)
Potassium: 3.7 mmol/L (ref 3.5–5.1)
SODIUM: 133 mmol/L — AB (ref 136–145)
Total Bilirubin: 0.5 mg/dL (ref 0.2–1.2)
Total Protein: 5.6 g/dL — ABNORMAL LOW (ref 6.4–8.3)

## 2017-07-20 LAB — ABO/RH: ABO/RH(D): B POS

## 2017-07-20 LAB — SAMPLE TO BLOOD BANK

## 2017-07-20 LAB — PREPARE RBC (CROSSMATCH)

## 2017-07-20 MED ORDER — DIPHENHYDRAMINE HCL 25 MG PO CAPS
ORAL_CAPSULE | ORAL | Status: AC
  Filled 2017-07-20: qty 1

## 2017-07-20 MED ORDER — ACETAMINOPHEN 325 MG PO TABS
650.0000 mg | ORAL_TABLET | Freq: Once | ORAL | Status: AC
  Administered 2017-07-20: 650 mg via ORAL

## 2017-07-20 MED ORDER — OXYCODONE HCL 5 MG PO TABS: 5.0000 mg | ORAL_TABLET | ORAL | 0 refills | Status: AC | PRN

## 2017-07-20 MED ORDER — SODIUM CHLORIDE 0.9% FLUSH
10.0000 mL | INTRAVENOUS | Status: DC | PRN
  Administered 2017-07-20: 10 mL via INTRAVENOUS
  Filled 2017-07-20: qty 10

## 2017-07-20 MED ORDER — DIPHENHYDRAMINE HCL 25 MG PO CAPS
25.0000 mg | ORAL_CAPSULE | Freq: Once | ORAL | Status: AC
  Administered 2017-07-20: 25 mg via ORAL

## 2017-07-20 MED ORDER — ACETAMINOPHEN 325 MG PO TABS
ORAL_TABLET | ORAL | Status: AC
  Filled 2017-07-20: qty 2

## 2017-07-20 MED ORDER — SODIUM CHLORIDE 0.9 % IV SOLN
250.0000 mL | Freq: Once | INTRAVENOUS | Status: AC
  Administered 2017-07-20: 250 mL via INTRAVENOUS

## 2017-07-20 MED ORDER — SODIUM CHLORIDE 0.9% FLUSH
10.0000 mL | INTRAVENOUS | Status: AC | PRN
  Administered 2017-07-20: 10 mL
  Filled 2017-07-20: qty 10

## 2017-07-20 MED ORDER — HEPARIN SOD (PORK) LOCK FLUSH 100 UNIT/ML IV SOLN
500.0000 [IU] | Freq: Every day | INTRAVENOUS | Status: AC | PRN
  Administered 2017-07-20: 500 [IU]
  Filled 2017-07-20: qty 5

## 2017-07-20 NOTE — Progress Notes (Signed)
Brief nutrition follow-up completed with patient during chemotherapy. Patient reports everything has improved. His appetite is better.  He is eating more. He does not drink oral nutrition supplements. Weight documented as 154.3 pounds January 23 decreased from 157.9 pounds December 5.   Patient denies any nutrition needs at this time  Reports he will contact me if he has questions or concerns.

## 2017-07-20 NOTE — Telephone Encounter (Signed)
Gave avs and calendar for february °

## 2017-07-20 NOTE — Patient Instructions (Signed)

## 2017-07-20 NOTE — Progress Notes (Signed)
Hematology and Oncology Follow Up Visit  [name] [name] [id number] 08/17/1945 72 y.o. 07/20/2017 10:31 AM [name] [name], MDDuncan, [name], MD   Principle Diagnosis: 72 year old man with the transitional cell carcinoma of the left renal pelvis after presenting with 4.9 cm mass of the upper pole of the left kidney.  He developed metastatic disease to the lung after a renal mass biopsy in October 2016.   Prior Therapy:   Systemic chemotherapy in the form of cisplatin and gemcitabine started on 05/27/2015. He is status post 6 cycles completed on 09/16/2015.  He had an excellent response initially but CT scan on 01/06/2016 showed progression of disease.  Pembrolizumab 200 mg every 3 weeks cycle 1 on 02/13/2016. Therapy concluded in October 2018 because of progression of disease.  Current therapy:  Carboplatin and gemcitabine chemotherapy started on 04/13/2017.  He is status post 3 cycles of therapy.  Therapy has been on hold at this time because of clinical deterioration.  Interim History:  [name] is here for a follow-up with his wife.  He continues to do poorly since the last visit.  He has not been able to receive any further chemotherapy because of decline in his performance status and pancytopenia.  He reports more fatigue and shortness of breath especially on exertion.  He denies any shortness of breath at rest.  He denies any cough or hemoptysis.  He is able to ambulate very short distances and does not have the energy to proceed further.  His hematuria has resolved at this time and urine has been clear.  He continues to have persistent lower extremity edema which has not changed dramatically.  His appetite has been poor and continues to lose weight.  His performance status has declined.  He continues to have issues with pain predominantly left-sided flank pain that is persistent at nighttime.  His pain is graded 8 out of 10 at nighttime and has been alternating Advil and Tylenol  without any relief.  He has taken Ultram also that has not been effective.   He does not report any headaches, blurry vision, seizures.  He denied any other neurological deficits.  He does not report any fevers, chills, or sweats. He does not report any chest pain, palpitation, orthopnea. He does not report any wheezing or hemoptysis. He does not report any  vomiting, abdominal pain. He does not report any frequency, urgency or hesitancy. He does not report any skeletal complaints.  He does not report any skin rashes or lesions.  He does not report any petechiae, easy bruising or lymphadenopathy.  He does report mood issues predominantly depression but no anxiety.  He denies any heat or cold intolerance.  Remaining review of systems is negative.   Medications: I have reviewed the patient's current medications.  Current Outpatient Medications  Medication Sig Dispense Refill  . acetaminophen (TYLENOL) 325 MG tablet Take 650 mg by mouth every 6 (six) hours as needed for moderate pain or headache.     [name] aspirin 81 MG chewable tablet Chew 1 tablet (81 mg total) by mouth daily.    [name] atorvastatin (LIPITOR) 40 MG tablet Take 1 tablet (40 mg total) daily by mouth. 90 tablet 3  . benzonatate (TESSALON) 200 MG capsule TAKE ONE CAPSULE BY MOUTH 3 TIMES A DAY AS NEEDED FOR COUGH 30 capsule 0  . Cholecalciferol (VITAMIN D) 1000 UNITS capsule Take 1,000 Units by mouth daily.      . diphenhydrAMINE (BENADRYL) 25 MG tablet Take 25  mg by mouth every 6 (six) hours as needed for sleep.     [name] docusate sodium (COLACE) 100 MG capsule Take 100 mg by mouth 2 (two) times daily as needed (constipation).    . famotidine (PEPCID) 20 MG tablet Take 1 tablet (20 mg total) by mouth daily. 30 tablet 2  . levothyroxine (SYNTHROID, LEVOTHROID) 100 MCG tablet Take 100 mcg daily before breakfast by mouth.    . lidocaine-prilocaine (EMLA) cream Apply to port-a-cath 1-2 hours prior to acces. Cover with saran wrap. 30 g 1  . metoprolol  tartrate (LOPRESSOR) 25 MG tablet Take 0.5 tablets (12.5 mg total) 2 (two) times daily by mouth. 90 tablet 3  . polycarbophil (FIBERCON) 625 MG tablet Take 625 mg by mouth 2 (two) times daily.     [name] PRESCRIPTION MEDICATION Inject 160 mg into the muscle daily. Gentamycin given for 3 days at [facility]    . prochlorperazine (COMPAZINE) 10 MG tablet Take 1 tablet (10 mg total) by mouth every 6 (six) hours as needed for nausea or vomiting. 30 tablet 0  . ranitidine (ZANTAC) 150 MG tablet Take 150 mg by mouth 2 (two) times daily.    . sildenafil (VIAGRA) 50 MG tablet Take 50 mg by mouth as needed for erectile dysfunction.     . traMADol (ULTRAM) 50 MG tablet Take 1 tablet (50 mg total) by mouth every 6 (six) hours as needed. (Patient taking differently: Take 50 mg by mouth every 6 (six) hours as needed for moderate pain or severe pain. ) 30 tablet 0  . nitroGLYCERIN (NITROSTAT) 0.4 MG SL tablet Place 1 tablet (0.4 mg total) every 5 (five) minutes as needed under the tongue. May repeat up to 3 doses. (Patient not taking: Reported on 07/20/2017) 100 tablet 3  . oxyCODONE (OXY IR/ROXICODONE) 5 MG immediate release tablet Take 1 tablet (5 mg total) by mouth every 4 (four) hours as needed for severe pain. 30 tablet 0   No current facility-administered medications for this visit.    Facility-Administered Medications Ordered in Other Visits  Medication Dose Route Frequency Provider Last Rate Last Dose  . 0.9 %  sodium chloride infusion  250 mL Intravenous Once Wyatt Portela, MD      . acetaminophen (TYLENOL) tablet 650 mg  650 mg Oral Once Wyatt Portela, MD      . diphenhydrAMINE (BENADRYL) capsule 25 mg  25 mg Oral Once Shadad, Firas N, MD      . heparin lock flush 100 unit/mL  500 Units Intracatheter Daily PRN Wyatt Portela, MD      . sodium chloride flush (NS) 0.9 % injection 10 mL  10 mL Intravenous PRN Wyatt Portela, MD   10 mL at 04/20/17 0829  . sodium chloride flush (NS) 0.9 % injection  10 mL  10 mL Intracatheter PRN Wyatt Portela, MD         Allergies:  Allergies  Allergen Reactions  . Hydrocodone Other (See Comments)    Became too sedated    Past Medical History, Surgical history, Social history, and Family History were reviewed and updated.   Physical Exam: Blood pressure 122/68, pulse 76, temperature 97.7 F (36.5 C), temperature source Oral, resp. rate 18, height 5\' 10"  (1.778 m), weight 154 lb 4.8 oz (70 kg), SpO2 100 %. ECOG: 1 General appearance: Well-appearing gentleman appeared without distress today. Head: Normocephalic, without obvious abnormality  Oral mucosa without thrush or ulcers.  Mucous membranes are pink. Eyes:  No scleral icterus.  Pupils are equal and round and reactive to light. Lymph nodes: Cervical, supraclavicular, and axillary nodes normal. Heart: Slightly tachycardic without murmurs or gallops.  Regular rate.  Edema noted bilaterally in his lower extremities. Lung:chest clear, no wheezing, rales, normal symmetric air entry Abdomin: soft, non-tender, without masses or organomegaly no shifting dullness or ascites. Musculoskeletal: No joint deformity or effusion. Skin: Pale without any ecchymosis or petechiae. Neurological: No deficits.  Lab Results: Lab Results  Component Value Date   WBC 11.7 (H) 07/20/2017   HGB 8.3 (L) 07/20/2017   HCT 25.8 (L) 07/20/2017   MCV 90.5 07/20/2017   PLT 66 (L) 07/20/2017     Chemistry      Component Value Date/Time   NA 133 (L) 07/20/2017 0847   NA 133 (L) 06/29/2017 0901   K 3.7 07/20/2017 0847   K 3.5 06/29/2017 0901   CL 100 07/20/2017 0847   CO2 26 07/20/2017 0847   CO2 23 06/29/2017 0901   BUN 17 07/20/2017 0847   BUN 15.3 06/29/2017 0901   CREATININE 1.11 07/20/2017 0847   CREATININE 1.2 06/29/2017 0901      Component Value Date/Time   CALCIUM 9.2 07/20/2017 0847   CALCIUM 8.1 (L) 06/29/2017 0901   ALKPHOS 96 07/20/2017 0847   ALKPHOS 91 06/29/2017 0901   AST 12 07/20/2017  0847   AST 12 06/29/2017 0901   ALT 6 07/20/2017 0847   ALT 8 06/29/2017 0901   BILITOT 0.5 07/20/2017 0847   BILITOT 0.44 06/29/2017 3474      72 year old gentleman with the following issues:  1. Transitional cell carcinoma of the left genitourinary tract presented with a 4.9 cm tumor in the upper pole of the left kidney and documented pulmonary metastasis.   He is status post multiple therapies outlined above.  His last chemotherapy is utilizing gemcitabine and carboplatin and completed 3 cycles of therapy.  His performance status continues to decline and had progressive cytopenia.  The natural course of this disease was discussed today with the patient and his wife.  He has clearly experienced deterioration despite the marginal benefit of chemotherapy radiographically, he is clinically declining.  The goal of chemotherapy is palliative and at this point chemotherapy is not successful in palliating his disease.  I have recommended discontinuation of chemotherapy and consideration for hospice enrollment.  I explained to him that his disease will likely progress in the next few months and eventually will lead to his death.  I anticipated his life expectancy of less than 6 months.   2. Hypercalcemia: His calcium checked today and within normal range.  Will repeat Zometa as needed.  3. IV access: Port-A-Cath remain in use without complications.  4. Hypotension: His blood pressure is within normal range at this time.  5. Anemia: Related to blood loss and chemotherapy.  His hemoglobin is close to 8 and will benefit from 1 unit of packed red cell transfusion.  6. Hypothyroidism: He is currently on 100 g of Synthroid.  Thyroid function is stabilizing.  7.  Hematuria: Related to his primary kidney neoplasm.  His hematuria has resolved at this time..  8.  Flank pain: Prescription for oxycodone was given to the patient today.  Given clear instructions to use at nighttime mostly with  adequate bowel regimen to prevent constipation..  9.  Pulmonary embolism: Anticoagulation is contraindicated because of his brisk hematuria.  IVC filter is in place.  10.  Prognosis: Poor with limited life expectancy.  This was discussed with the patient today.  11. Follow-up: Will be in 3 weeks to follow his progress.  30  minutes was spent with the patient face-to-face today.  More than 50% of time was dedicated to patient counseling, education and coordination of the patient's multifaceted care.   Zola Button, MD 1/23/201910:31 AM

## 2017-07-20 NOTE — Patient Instructions (Signed)
Implanted Port Home Guide An implanted port is a type of central line that is placed under the skin. Central lines are used to provide IV access when treatment or nutrition needs to be given through a person's veins. Implanted ports are used for long-term IV access. An implanted port may be placed because:  You need IV medicine that would be irritating to the small veins in your hands or arms.  You need long-term IV medicines, such as antibiotics.  You need IV nutrition for a long period.  You need frequent blood draws for lab tests.  You need dialysis.  Implanted ports are usually placed in the chest area, but they can also be placed in the upper arm, the abdomen, or the leg. An implanted port has two main parts:  Reservoir. The reservoir is round and will appear as a small, raised area under your skin. The reservoir is the part where a needle is inserted to give medicines or draw blood.  Catheter. The catheter is a thin, flexible tube that extends from the reservoir. The catheter is placed into a large vein. Medicine that is inserted into the reservoir goes into the catheter and then into the vein.  How will I care for my incision site? Do not get the incision site wet. Bathe or shower as directed by your health care provider. How is my port accessed? Special steps must be taken to access the port:  Before the port is accessed, a numbing cream can be placed on the skin. This helps numb the skin over the port site.  Your health care provider uses a sterile technique to access the port. ? Your health care provider must put on a mask and sterile gloves. ? The skin over your port is cleaned carefully with an antiseptic and allowed to dry. ? The port is gently pinched between sterile gloves, and a needle is inserted into the port.  Only "non-coring" port needles should be used to access the port. Once the port is accessed, a blood return should be checked. This helps ensure that the port  is in the vein and is not clogged.  If your port needs to remain accessed for a constant infusion, a clear (transparent) bandage will be placed over the needle site. The bandage and needle will need to be changed every week, or as directed by your health care provider.  Keep the bandage covering the needle clean and dry. Do not get it wet. Follow your health care provider's instructions on how to take a shower or bath while the port is accessed.  If your port does not need to stay accessed, no bandage is needed over the port.  What is flushing? Flushing helps keep the port from getting clogged. Follow your health care provider's instructions on how and when to flush the port. Ports are usually flushed with saline solution or a medicine called heparin. The need for flushing will depend on how the port is used.  If the port is used for intermittent medicines or blood draws, the port will need to be flushed: ? After medicines have been given. ? After blood has been drawn. ? As part of routine maintenance.  If a constant infusion is running, the port may not need to be flushed.  How long will my port stay implanted? The port can stay in for as long as your health care provider thinks it is needed. When it is time for the port to come out, surgery will be   done to remove it. The procedure is similar to the one performed when the port was put in. When should I seek immediate medical care? When you have an implanted port, you should seek immediate medical care if:  You notice a bad smell coming from the incision site.  You have swelling, redness, or drainage at the incision site.  You have more swelling or pain at the port site or the surrounding area.  You have a fever that is not controlled with medicine.  This information is not intended to replace advice given to you by your health care provider. Make sure you discuss any questions you have with your health care provider. Document  Released: 06/14/2005 Document Revised: 11/20/2015 Document Reviewed: 02/19/2013 [employer] Interactive Patient Education  2017 [employer]  

## 2017-07-21 LAB — TYPE AND SCREEN
ABO/RH(D): B POS
Antibody Screen: NEGATIVE
Unit division: 0

## 2017-07-21 LAB — BPAM RBC
BLOOD PRODUCT EXPIRATION DATE: 201901292359
ISSUE DATE / TIME: 201901231126
UNIT TYPE AND RH: 1700

## 2017-07-26 ENCOUNTER — Telehealth: Payer: Self-pay | Admitting: *Deleted

## 2017-07-26 ENCOUNTER — Encounter: Payer: Self-pay | Admitting: *Deleted

## 2017-07-26 NOTE — Telephone Encounter (Signed)
rec'd call from nurse fran at [facility] in [place]. Wife linda requests hospice. Per dr [name], he will be the attending, hospice physicians may do symptom management and have DNR signed.

## 2017-07-27 ENCOUNTER — Telehealth: Payer: Self-pay | Admitting: *Deleted

## 2017-07-27 NOTE — Telephone Encounter (Signed)
Nurse roxanne from hospice of the [facility]. States when nurse went to home to assess patient, his health had declined to the point, that he was a direct admit to the hospice house in [place].

## 2017-07-29 ENCOUNTER — Ambulatory Visit (HOSPITAL_COMMUNITY)
Admission: RE | Admit: 2017-07-29 | Discharge: 2017-07-29 | Disposition: A | Discharge status: Home / Self Care | Payer: Medicare Other | Source: Ambulatory Visit | Attending: Oncology | Admitting: Oncology

## 2017-07-29 ENCOUNTER — Other Ambulatory Visit: Payer: Self-pay | Admitting: *Deleted

## 2017-07-29 DIAGNOSIS — I10 Essential (primary) hypertension: Secondary | ICD-10-CM

## 2017-07-29 DIAGNOSIS — I4891 Unspecified atrial fibrillation: Secondary | ICD-10-CM

## 2017-07-29 MED ORDER — METOPROLOL TARTRATE 25 MG PO TABS: 12.5000 mg | ORAL_TABLET | Freq: Two times a day (BID) | ORAL | 2 refills | Status: AC

## 2017-07-30 ENCOUNTER — Encounter: Payer: Self-pay | Admitting: Oncology

## 2017-08-01 ENCOUNTER — Telehealth: Payer: Self-pay | Admitting: Family Medicine

## 2017-08-01 NOTE — Telephone Encounter (Signed)
Patient died this weekend.  Called his wife . Condolences offered.   I appreciate all involved in his care.   I was glad to see this kind gentleman in clinic.    Please process the chart.  Thanks.

## 2017-08-01 NOTE — Telephone Encounter (Signed)
Wife told me 08/07/2017.  Thanks.

## 2017-08-01 NOTE — Telephone Encounter (Signed)
Marked accordingly.

## 2017-08-01 NOTE — Telephone Encounter (Signed)
Do you have a date?  We need that to issue Deceased  in the chart.

## 2017-08-08 ENCOUNTER — Ambulatory Visit: Payer: Medicare Other | Admitting: Oncology

## 2017-08-08 ENCOUNTER — Other Ambulatory Visit: Payer: Medicare Other

## 2017-08-26 DEATH — deceased

## 2017-09-08 ENCOUNTER — Other Ambulatory Visit: Payer: Self-pay | Admitting: Nurse Practitioner

## 2019-01-12 IMAGING — CT CT ABD-PELV W/ CM
3 of 5 series · 14 of 36 positions shown, 16 images · IV contrast (APPLIED)
Comparison: 03/31/2017

CLINICAL DATA: Followup metastatic renal cell carcinoma.

EXAM:
CT CHEST, ABDOMEN, AND PELVIS WITH CONTRAST
TECHNIQUE: Multidetector CT imaging of the chest, abdomen and pelvis was
performed following the standard protocol during bolus
administration of intravenous contrast.
CONTRAST:  100mL [id number] IOPAMIDOL ([id number]) INJECTION 61%

[Series 2: cap with · axial · 0.71mm/px · z∈[-623,-123]mm · 8 of 130 slices shown, 10 images]
[im 15/130  mediastinal]
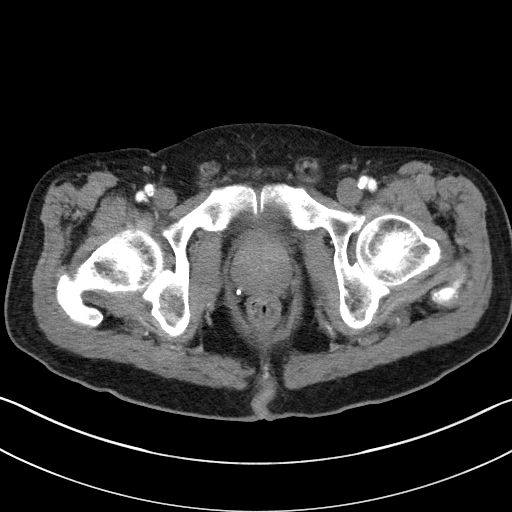
[im 15/130  lung]
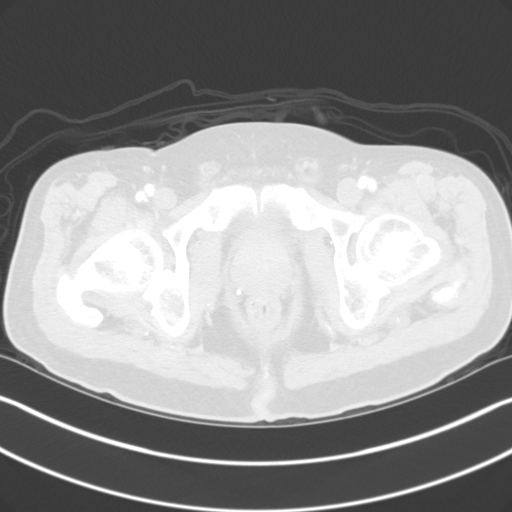
[im 29/130  lung]
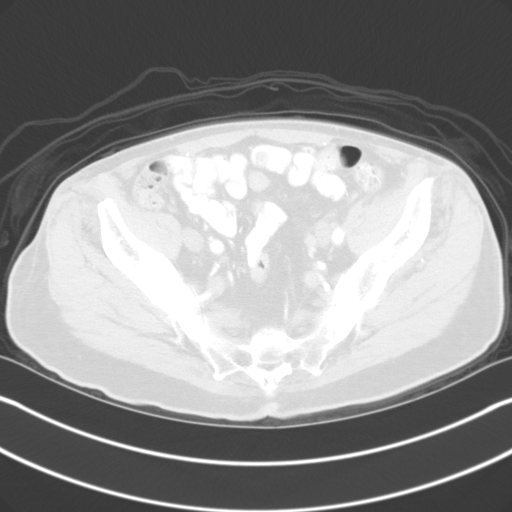
[im 44/130  lung]
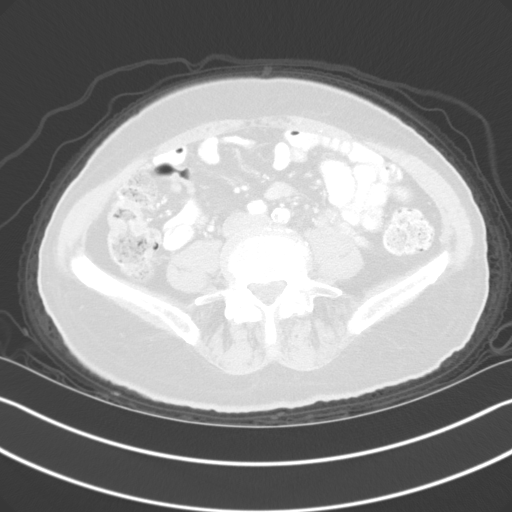
[im 58/130  lung]
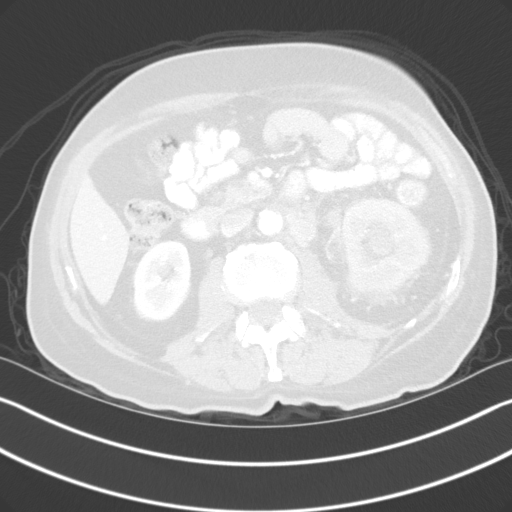
[im 72/130  mediastinal]
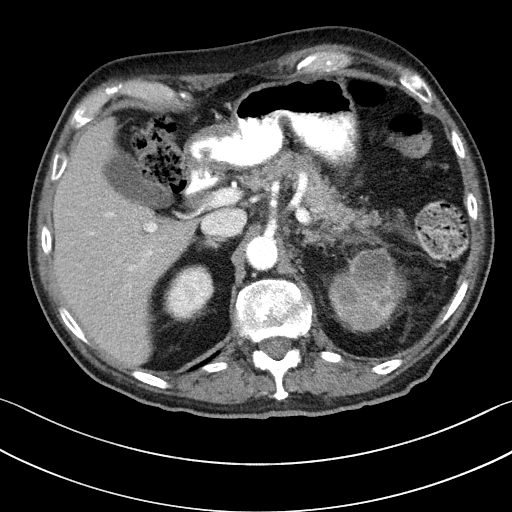
[im 72/130  lung]
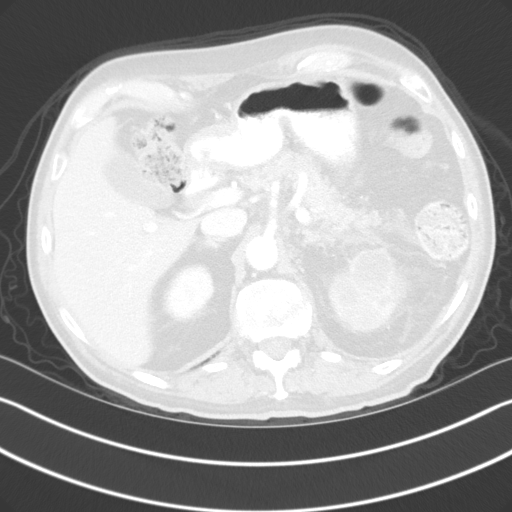
[im 87/130  lung]
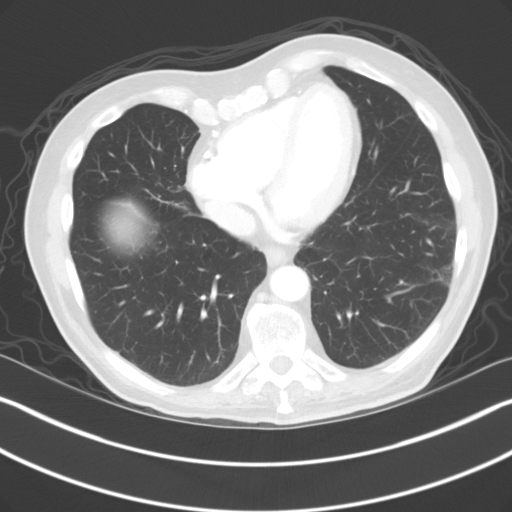
[im 101/130  lung]
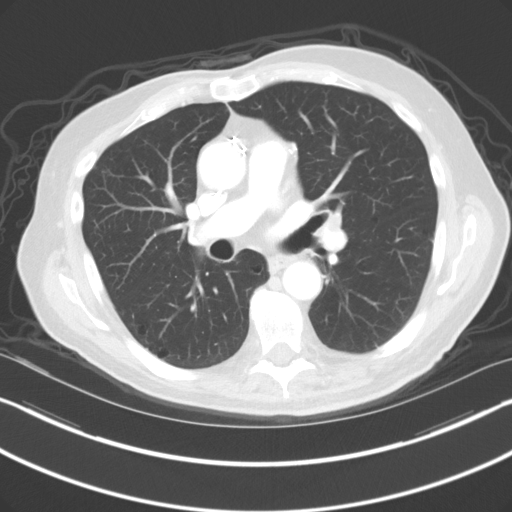
[im 115/130  lung]
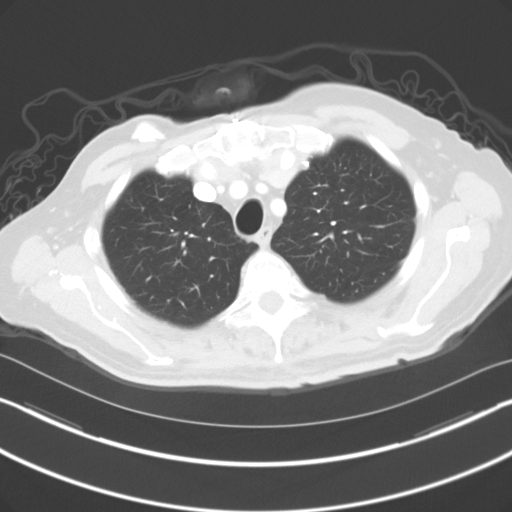

[Series 4: lung · axial · 0.71mm/px · z∈[-322,-240]mm · 3 of 151 slices shown]
[im 14/151  lung]
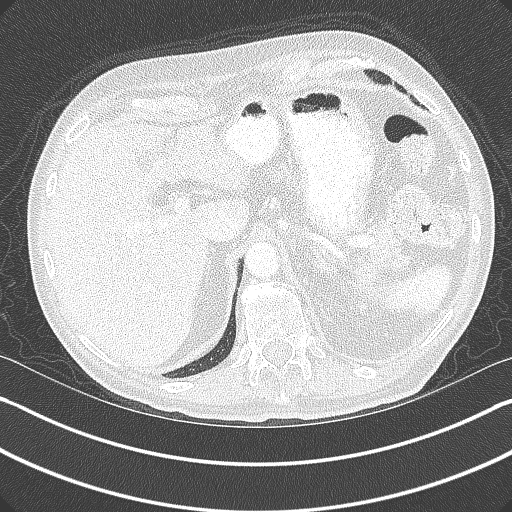
[im 28/151  lung]
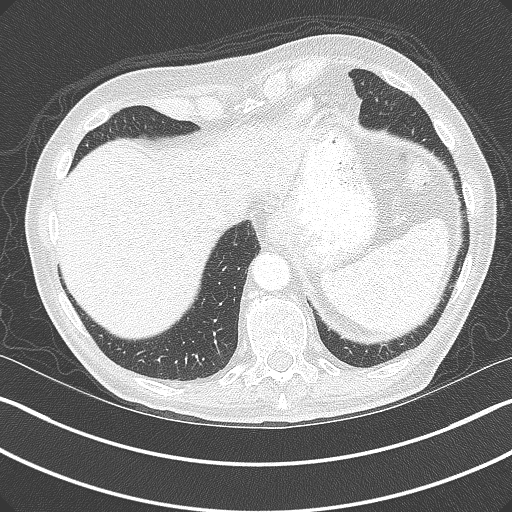
[im 55/151  lung]
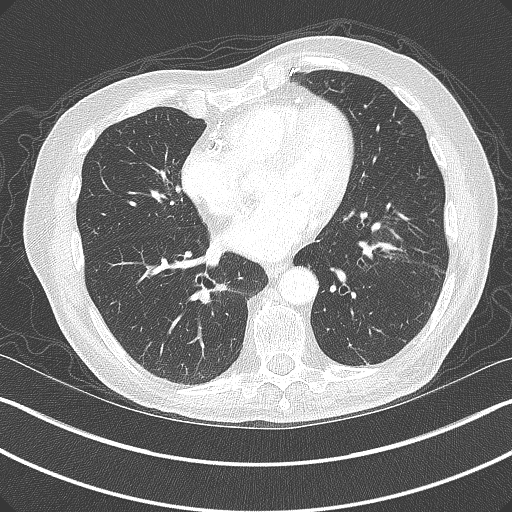

[Series 5: coronals · coronal · 0.84mm/px · 3 of 174 slices shown]
[im 35/174  lung]
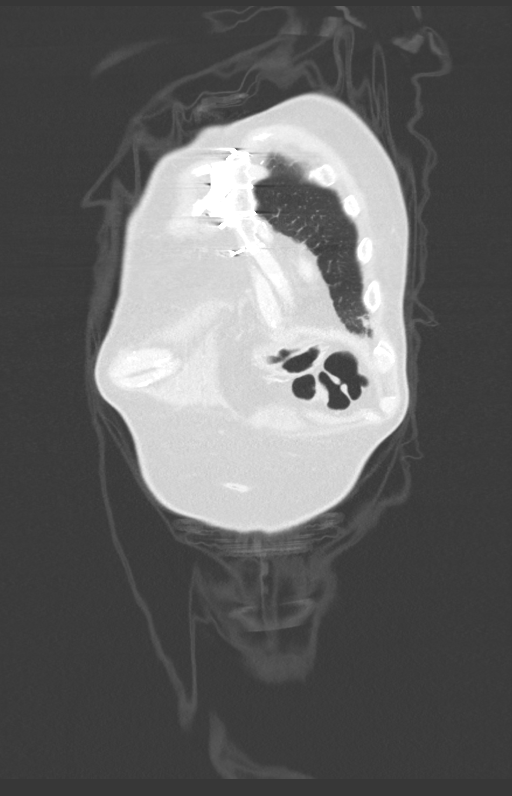
[im 70/174  lung]
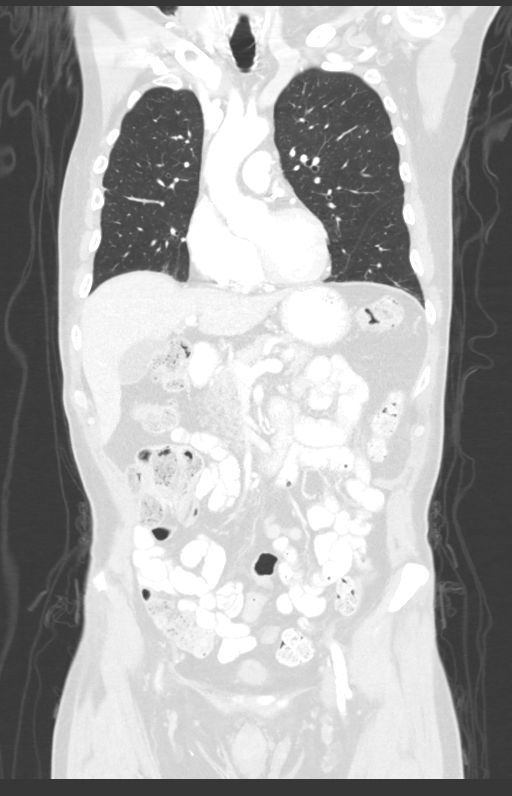
[im 104/174  lung]
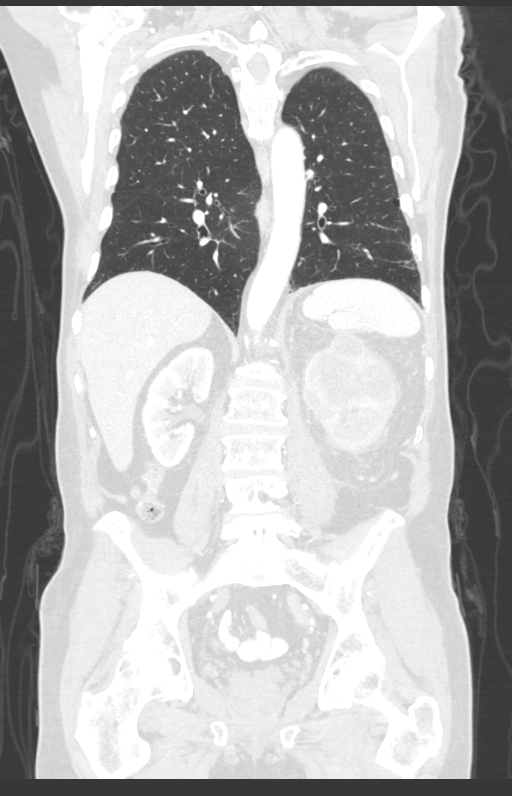

[14 of 36 positions shown; findings below may reference images not displayed]

FINDINGS: CT CHEST FINDINGS

Cardiovascular: Previous median sternotomy and CABG procedure.
Aortic atherosclerosis. Normal heart size. Aortic atherosclerosis
and calcification of the native coronary artery is noted. Abnormal
filling defects are identified within bilateral lower lobar
pulmonary arteries as well as lingular segmental and bilateral lower
lobe segmental pulmonary arteries.

Mediastinum/Nodes: The trachea appears patent and is midline. Normal
appearance of the esophagus. No mediastinal or hilar adenopathy. No
axillary or supraclavicular adenopathy.

Lungs/Pleura: No pleural effusion identified. Left upper lobe
pulmonary nodule measures 9 mm, image 29 of series 4. Previously 10
mm. Index right lower lobe pulmonary nodule measures 6 mm, image 98
of series 4. Previously 2.4 cm. No new or enlarging nodules.

Musculoskeletal: Spondylosis identified within the thoracic spine.
No aggressive lytic or sclerotic bone lesions.

CT ABDOMEN PELVIS FINDINGS

Hepatobiliary: Stable cyst within segment 7 of the liver measuring 2
cm. No suspicious liver abnormalities. The gallbladder appears
within normal limits. No biliary dilatation.

Pancreas: Normal appearance of the pancreas.

Spleen: Spleen is unremarkable.

Adrenals/Urinary Tract: The adrenal glands appear normal. Normal
appearance of the right kidney. Large mass involving the upper pole
measures 7.3 x 7.0 cm, image 64 of series 2. Previously 7.9 x
cm. New involvement of the left renal vein with thrombus extending
to the IVC, image 67 of series 2. Bladder appears normal.

Stomach/Bowel: Stomach is unremarkable. The small bowel loops have a
normal course and caliber. No the appendix is visualized and appears
increased in diameter measuring 10 mm. No surrounding inflammatory
changes. The appearance is similar to 03/31/2017. Normal appearance
of the colon.

Vascular/Lymphatic: Aortic atherosclerosis. No aneurysm. Periaortic
lymph node at the level of the left kidney measures 4.0 x 2.6 cm,
image 67 of series 2. Previously 4.1 x 2.6 cm. The urinary bladder
appears within normal limits.

Reproductive: Prostate is unremarkable.

Other: No abdominal wall hernia or abnormality. No abdominopelvic
ascites.

Musculoskeletal: Degenerative disc disease noted within the lumbar
spine.
IMPRESSION: 1. Bilateral lobar and segmental pulmonary artery filling defects
compatible with pulmonary embolus.
2. The primary lesion within the left kidney is decreased in size
from previous exam. The dominant nodule within the right lower lobe
has also decreased in size in the interval.
3. No significant change and periaortic adenopathy.
4. New involvement of the left renal vein with thrombus extending to
the IVC.
5.  Aortic Atherosclerosis ([id number]).
6. Critical Value/emergent results were called by telephone at the
time of interpretation on 06/03/2017 at [DATE] to Dr. SHEN GASHI ,
who verbally acknowledged these results.
